# Patient Record
Sex: Male | Born: 1965 | Race: Black or African American | Hispanic: No | Marital: Married | Smoking: Never smoker
Health system: Southern US, Community
[De-identification: ages and names within clinical notes are randomized; demographics above are authoritative.]

## PROBLEM LIST (undated history)

## (undated) DIAGNOSIS — E785 Hyperlipidemia, unspecified: Secondary | ICD-10-CM

## (undated) DIAGNOSIS — M545 Low back pain, unspecified: Secondary | ICD-10-CM

## (undated) DIAGNOSIS — E119 Type 2 diabetes mellitus without complications: Secondary | ICD-10-CM

## (undated) DIAGNOSIS — Z87442 Personal history of urinary calculi: Secondary | ICD-10-CM

## (undated) DIAGNOSIS — F419 Anxiety disorder, unspecified: Secondary | ICD-10-CM

## (undated) DIAGNOSIS — M109 Gout, unspecified: Secondary | ICD-10-CM

## (undated) DIAGNOSIS — T4145XA Adverse effect of unspecified anesthetic, initial encounter: Secondary | ICD-10-CM

## (undated) DIAGNOSIS — G4730 Sleep apnea, unspecified: Secondary | ICD-10-CM

## (undated) DIAGNOSIS — E079 Disorder of thyroid, unspecified: Secondary | ICD-10-CM

## (undated) DIAGNOSIS — I2510 Atherosclerotic heart disease of native coronary artery without angina pectoris: Secondary | ICD-10-CM

## (undated) DIAGNOSIS — G629 Polyneuropathy, unspecified: Secondary | ICD-10-CM

## (undated) DIAGNOSIS — I509 Heart failure, unspecified: Secondary | ICD-10-CM

## (undated) DIAGNOSIS — I10 Essential (primary) hypertension: Secondary | ICD-10-CM

## (undated) DIAGNOSIS — N184 Chronic kidney disease, stage 4 (severe): Secondary | ICD-10-CM

## (undated) DIAGNOSIS — E039 Hypothyroidism, unspecified: Secondary | ICD-10-CM

## (undated) DIAGNOSIS — K219 Gastro-esophageal reflux disease without esophagitis: Secondary | ICD-10-CM

## (undated) DIAGNOSIS — T8859XA Other complications of anesthesia, initial encounter: Secondary | ICD-10-CM

## (undated) DIAGNOSIS — I219 Acute myocardial infarction, unspecified: Secondary | ICD-10-CM

## (undated) HISTORY — DX: Low back pain, unspecified: M54.50

## (undated) HISTORY — DX: Sleep apnea, unspecified: G47.30

## (undated) HISTORY — DX: Essential (primary) hypertension: I10

## (undated) HISTORY — DX: Disorder of thyroid, unspecified: E07.9

## (undated) HISTORY — DX: Low back pain: M54.5

## (undated) HISTORY — DX: Personal history of urinary calculi: Z87.442

## (undated) HISTORY — DX: Heart failure, unspecified: I50.9

## (undated) HISTORY — DX: Atherosclerotic heart disease of native coronary artery without angina pectoris: I25.10

## (undated) HISTORY — DX: Type 2 diabetes mellitus without complications: E11.9

## (undated) HISTORY — DX: Acute myocardial infarction, unspecified: I21.9

## (undated) HISTORY — PX: TRACHEOSTOMY: SUR1362

## (undated) HISTORY — DX: Gastro-esophageal reflux disease without esophagitis: K21.9

## (undated) HISTORY — PX: OTHER SURGICAL HISTORY: SHX169

## (undated) HISTORY — PX: CORONARY ANGIOPLASTY WITH STENT PLACEMENT: SHX49

## (undated) HISTORY — DX: Hyperlipidemia, unspecified: E78.5

## (undated) HISTORY — PX: REFRACTIVE SURGERY: SHX103

---

## 1998-06-11 ENCOUNTER — Emergency Department (HOSPITAL_COMMUNITY): Admission: EM | Admit: 1998-06-11 | Discharge: 1998-06-11 | Payer: Self-pay | Admitting: Emergency Medicine

## 1998-06-13 ENCOUNTER — Emergency Department (HOSPITAL_COMMUNITY): Admission: EM | Admit: 1998-06-13 | Discharge: 1998-06-13 | Payer: Self-pay | Admitting: Emergency Medicine

## 2001-02-07 ENCOUNTER — Emergency Department (HOSPITAL_COMMUNITY): Admission: EM | Admit: 2001-02-07 | Discharge: 2001-02-07 | Payer: Self-pay | Admitting: Emergency Medicine

## 2001-02-11 ENCOUNTER — Emergency Department (HOSPITAL_COMMUNITY): Admission: EM | Admit: 2001-02-11 | Discharge: 2001-02-11 | Payer: Self-pay | Admitting: *Deleted

## 2001-02-17 ENCOUNTER — Encounter: Admission: RE | Admit: 2001-02-17 | Discharge: 2001-02-17 | Payer: Self-pay | Admitting: Surgery

## 2001-02-17 ENCOUNTER — Encounter: Payer: Self-pay | Admitting: Surgery

## 2001-02-18 ENCOUNTER — Encounter: Admission: RE | Admit: 2001-02-18 | Discharge: 2001-02-18 | Payer: Self-pay | Admitting: Surgery

## 2001-02-18 ENCOUNTER — Encounter: Payer: Self-pay | Admitting: Surgery

## 2001-02-20 ENCOUNTER — Inpatient Hospital Stay (HOSPITAL_COMMUNITY): Admission: RE | Admit: 2001-02-20 | Discharge: 2001-03-02 | Payer: Self-pay | Admitting: Surgery

## 2001-02-20 ENCOUNTER — Encounter: Payer: Self-pay | Admitting: Surgery

## 2001-02-20 ENCOUNTER — Encounter (INDEPENDENT_AMBULATORY_CARE_PROVIDER_SITE_OTHER): Payer: Self-pay

## 2001-02-22 ENCOUNTER — Encounter: Payer: Self-pay | Admitting: Surgery

## 2001-02-24 ENCOUNTER — Encounter: Payer: Self-pay | Admitting: Surgery

## 2001-03-22 ENCOUNTER — Encounter: Admission: RE | Admit: 2001-03-22 | Discharge: 2001-03-22 | Payer: Self-pay | Admitting: Surgery

## 2001-03-22 ENCOUNTER — Encounter: Payer: Self-pay | Admitting: Surgery

## 2001-04-15 ENCOUNTER — Ambulatory Visit: Admission: RE | Admit: 2001-04-15 | Discharge: 2001-04-15 | Payer: Self-pay | Admitting: Surgery

## 2001-04-29 ENCOUNTER — Encounter: Admission: RE | Admit: 2001-04-29 | Discharge: 2001-07-28 | Payer: Self-pay | Admitting: Surgery

## 2001-05-03 ENCOUNTER — Encounter: Admission: RE | Admit: 2001-05-03 | Discharge: 2001-05-03 | Payer: Self-pay | Admitting: Surgery

## 2001-05-03 ENCOUNTER — Encounter: Payer: Self-pay | Admitting: Surgery

## 2001-10-22 ENCOUNTER — Emergency Department (HOSPITAL_COMMUNITY): Admission: EM | Admit: 2001-10-22 | Discharge: 2001-10-22 | Payer: Self-pay | Admitting: *Deleted

## 2001-12-21 ENCOUNTER — Encounter: Payer: Self-pay | Admitting: Endocrinology

## 2001-12-21 ENCOUNTER — Ambulatory Visit (HOSPITAL_COMMUNITY): Admission: RE | Admit: 2001-12-21 | Discharge: 2001-12-21 | Payer: Self-pay | Admitting: Endocrinology

## 2002-01-17 ENCOUNTER — Encounter: Admission: RE | Admit: 2002-01-17 | Discharge: 2002-02-25 | Payer: Self-pay | Admitting: Occupational Medicine

## 2002-07-19 ENCOUNTER — Encounter: Payer: Self-pay | Admitting: Emergency Medicine

## 2002-07-20 ENCOUNTER — Encounter: Payer: Self-pay | Admitting: Emergency Medicine

## 2002-07-20 ENCOUNTER — Inpatient Hospital Stay (HOSPITAL_COMMUNITY): Admission: EM | Admit: 2002-07-20 | Discharge: 2002-07-24 | Payer: Self-pay | Admitting: Emergency Medicine

## 2002-07-20 ENCOUNTER — Encounter: Payer: Self-pay | Admitting: Internal Medicine

## 2002-07-21 ENCOUNTER — Encounter: Payer: Self-pay | Admitting: Internal Medicine

## 2002-07-22 ENCOUNTER — Encounter: Payer: Self-pay | Admitting: Internal Medicine

## 2003-04-19 ENCOUNTER — Encounter: Payer: Self-pay | Admitting: Emergency Medicine

## 2003-04-19 ENCOUNTER — Encounter: Payer: Self-pay | Admitting: Endocrinology

## 2003-04-20 ENCOUNTER — Inpatient Hospital Stay (HOSPITAL_COMMUNITY): Admission: EM | Admit: 2003-04-20 | Discharge: 2003-04-21 | Payer: Self-pay | Admitting: Emergency Medicine

## 2003-04-20 ENCOUNTER — Encounter: Payer: Self-pay | Admitting: Cardiology

## 2003-04-21 ENCOUNTER — Encounter: Payer: Self-pay | Admitting: Internal Medicine

## 2004-12-25 ENCOUNTER — Other Ambulatory Visit: Payer: Self-pay

## 2004-12-25 ENCOUNTER — Emergency Department: Payer: Self-pay | Admitting: Emergency Medicine

## 2006-05-28 ENCOUNTER — Ambulatory Visit: Payer: Self-pay | Admitting: Infectious Diseases

## 2006-09-04 ENCOUNTER — Emergency Department: Payer: Self-pay | Admitting: Emergency Medicine

## 2006-09-04 ENCOUNTER — Emergency Department: Payer: Self-pay | Admitting: Internal Medicine

## 2006-09-04 ENCOUNTER — Other Ambulatory Visit: Payer: Self-pay

## 2006-10-27 ENCOUNTER — Ambulatory Visit: Payer: Self-pay | Admitting: Infectious Diseases

## 2006-11-23 ENCOUNTER — Other Ambulatory Visit: Payer: Self-pay

## 2006-11-23 ENCOUNTER — Emergency Department: Payer: Self-pay | Admitting: Emergency Medicine

## 2007-03-30 ENCOUNTER — Ambulatory Visit: Payer: Self-pay | Admitting: Specialist

## 2007-03-31 ENCOUNTER — Inpatient Hospital Stay: Payer: Self-pay | Admitting: Specialist

## 2007-04-05 ENCOUNTER — Ambulatory Visit: Payer: Self-pay | Admitting: Specialist

## 2007-04-16 ENCOUNTER — Inpatient Hospital Stay (HOSPITAL_COMMUNITY): Admission: EM | Admit: 2007-04-16 | Discharge: 2007-04-22 | Payer: Self-pay | Admitting: Emergency Medicine

## 2007-04-16 ENCOUNTER — Ambulatory Visit: Payer: Self-pay | Admitting: Vascular Surgery

## 2007-04-20 ENCOUNTER — Ambulatory Visit: Payer: Self-pay | Admitting: Internal Medicine

## 2007-05-04 ENCOUNTER — Ambulatory Visit: Payer: Self-pay | Admitting: Endocrinology

## 2007-05-04 LAB — CONVERTED CEMR LAB: Calcium, Total (PTH): 9.2 mg/dL (ref 8.4–10.5)

## 2007-05-12 ENCOUNTER — Ambulatory Visit: Payer: Self-pay | Admitting: Endocrinology

## 2007-05-18 ENCOUNTER — Encounter (HOSPITAL_BASED_OUTPATIENT_CLINIC_OR_DEPARTMENT_OTHER): Admission: RE | Admit: 2007-05-18 | Discharge: 2007-07-22 | Payer: Self-pay | Admitting: Surgery

## 2007-08-13 ENCOUNTER — Emergency Department (HOSPITAL_COMMUNITY): Admission: EM | Admit: 2007-08-13 | Discharge: 2007-08-13 | Payer: Self-pay | Admitting: Emergency Medicine

## 2007-09-27 ENCOUNTER — Ambulatory Visit: Payer: Self-pay | Admitting: Internal Medicine

## 2007-10-17 ENCOUNTER — Ambulatory Visit: Payer: Self-pay | Admitting: Internal Medicine

## 2007-10-22 ENCOUNTER — Encounter: Payer: Self-pay | Admitting: Internal Medicine

## 2007-11-05 ENCOUNTER — Encounter: Payer: Self-pay | Admitting: Endocrinology

## 2007-11-15 ENCOUNTER — Encounter: Payer: Self-pay | Admitting: Endocrinology

## 2007-11-15 DIAGNOSIS — M545 Low back pain: Secondary | ICD-10-CM

## 2007-11-15 DIAGNOSIS — I10 Essential (primary) hypertension: Secondary | ICD-10-CM | POA: Insufficient documentation

## 2007-11-15 DIAGNOSIS — E785 Hyperlipidemia, unspecified: Secondary | ICD-10-CM

## 2007-11-15 DIAGNOSIS — I2510 Atherosclerotic heart disease of native coronary artery without angina pectoris: Secondary | ICD-10-CM | POA: Insufficient documentation

## 2007-11-15 DIAGNOSIS — K219 Gastro-esophageal reflux disease without esophagitis: Secondary | ICD-10-CM | POA: Insufficient documentation

## 2007-11-15 DIAGNOSIS — A4901 Methicillin susceptible Staphylococcus aureus infection, unspecified site: Secondary | ICD-10-CM | POA: Insufficient documentation

## 2007-11-15 DIAGNOSIS — Z87442 Personal history of urinary calculi: Secondary | ICD-10-CM | POA: Insufficient documentation

## 2007-11-15 DIAGNOSIS — E119 Type 2 diabetes mellitus without complications: Secondary | ICD-10-CM

## 2007-11-15 DIAGNOSIS — Z8669 Personal history of other diseases of the nervous system and sense organs: Secondary | ICD-10-CM | POA: Insufficient documentation

## 2007-11-15 DIAGNOSIS — I509 Heart failure, unspecified: Secondary | ICD-10-CM | POA: Insufficient documentation

## 2007-11-15 DIAGNOSIS — J309 Allergic rhinitis, unspecified: Secondary | ICD-10-CM | POA: Insufficient documentation

## 2007-11-16 ENCOUNTER — Ambulatory Visit: Payer: Self-pay | Admitting: Endocrinology

## 2007-11-20 ENCOUNTER — Emergency Department (HOSPITAL_COMMUNITY): Admission: EM | Admit: 2007-11-20 | Discharge: 2007-11-20 | Payer: Self-pay | Admitting: Emergency Medicine

## 2007-12-31 ENCOUNTER — Encounter: Payer: Self-pay | Admitting: Endocrinology

## 2008-01-07 ENCOUNTER — Encounter: Payer: Self-pay | Admitting: Endocrinology

## 2008-02-03 ENCOUNTER — Encounter: Payer: Self-pay | Admitting: Endocrinology

## 2008-02-16 ENCOUNTER — Encounter: Payer: Self-pay | Admitting: Endocrinology

## 2008-02-24 ENCOUNTER — Emergency Department (HOSPITAL_COMMUNITY): Admission: EM | Admit: 2008-02-24 | Discharge: 2008-02-24 | Payer: Self-pay | Admitting: Emergency Medicine

## 2008-05-10 ENCOUNTER — Emergency Department (HOSPITAL_COMMUNITY): Admission: EM | Admit: 2008-05-10 | Discharge: 2008-05-10 | Payer: Self-pay | Admitting: Emergency Medicine

## 2008-09-19 ENCOUNTER — Observation Stay (HOSPITAL_COMMUNITY): Admission: EM | Admit: 2008-09-19 | Discharge: 2008-09-20 | Payer: Self-pay | Admitting: Emergency Medicine

## 2008-09-19 ENCOUNTER — Ambulatory Visit: Payer: Self-pay | Admitting: Cardiovascular Disease

## 2009-06-01 ENCOUNTER — Other Ambulatory Visit: Payer: Self-pay | Admitting: Otolaryngology

## 2009-07-20 ENCOUNTER — Ambulatory Visit: Payer: Self-pay | Admitting: Otolaryngology

## 2009-07-26 ENCOUNTER — Ambulatory Visit: Payer: Self-pay | Admitting: Otolaryngology

## 2009-10-23 ENCOUNTER — Ambulatory Visit: Payer: Self-pay | Admitting: Internal Medicine

## 2009-12-05 ENCOUNTER — Ambulatory Visit: Payer: Self-pay | Admitting: Internal Medicine

## 2009-12-18 ENCOUNTER — Ambulatory Visit: Payer: Self-pay | Admitting: Internal Medicine

## 2010-01-17 ENCOUNTER — Ambulatory Visit: Payer: Self-pay

## 2010-03-24 ENCOUNTER — Emergency Department: Payer: Self-pay

## 2010-06-11 ENCOUNTER — Ambulatory Visit: Payer: Self-pay | Admitting: Pain Medicine

## 2010-06-17 ENCOUNTER — Ambulatory Visit: Payer: Self-pay | Admitting: Pain Medicine

## 2010-07-09 ENCOUNTER — Encounter: Payer: Self-pay | Admitting: Pain Medicine

## 2010-07-18 ENCOUNTER — Encounter: Payer: Self-pay | Admitting: Pain Medicine

## 2010-08-17 ENCOUNTER — Encounter: Payer: Self-pay | Admitting: Pain Medicine

## 2010-09-19 ENCOUNTER — Ambulatory Visit: Payer: Self-pay | Admitting: Internal Medicine

## 2010-09-27 ENCOUNTER — Ambulatory Visit: Payer: Self-pay | Admitting: Internal Medicine

## 2010-10-04 ENCOUNTER — Ambulatory Visit: Payer: Self-pay | Admitting: Internal Medicine

## 2010-11-17 HISTORY — PX: AMPUTATION: SHX166

## 2010-11-29 ENCOUNTER — Inpatient Hospital Stay (HOSPITAL_COMMUNITY)
Admission: RE | Admit: 2010-11-29 | Discharge: 2010-12-10 | Payer: Self-pay | Attending: Physical Medicine & Rehabilitation | Admitting: Physical Medicine & Rehabilitation

## 2010-12-02 LAB — GLUCOSE, CAPILLARY
Glucose-Capillary: 69 mg/dL — ABNORMAL LOW (ref 70–99)
Glucose-Capillary: 99 mg/dL (ref 70–99)
Glucose-Capillary: 100 mg/dL — ABNORMAL HIGH (ref 70–99)
Glucose-Capillary: 104 mg/dL — ABNORMAL HIGH (ref 70–99)
Glucose-Capillary: 137 mg/dL — ABNORMAL HIGH (ref 70–99)
Glucose-Capillary: 115 mg/dL — ABNORMAL HIGH (ref 70–99)
Glucose-Capillary: 119 mg/dL — ABNORMAL HIGH (ref 70–99)
Glucose-Capillary: 143 mg/dL — ABNORMAL HIGH (ref 70–99)
Glucose-Capillary: 128 mg/dL — ABNORMAL HIGH (ref 70–99)
Glucose-Capillary: 170 mg/dL — ABNORMAL HIGH (ref 70–99)
Glucose-Capillary: 68 mg/dL — ABNORMAL LOW (ref 70–99)
Glucose-Capillary: 106 mg/dL — ABNORMAL HIGH (ref 70–99)
Glucose-Capillary: 144 mg/dL — ABNORMAL HIGH (ref 70–99)

## 2010-12-02 LAB — DIFFERENTIAL
Basophils Absolute: 0 10*3/uL (ref 0.0–0.1)
Basophils Relative: 0 % (ref 0–1)
Eosinophils Absolute: 0.1 10*3/uL (ref 0.0–0.7)
Eosinophils Relative: 1 % (ref 0–5)
Lymphocytes Relative: 21 % (ref 12–46)
Lymphs Abs: 1.6 10*3/uL (ref 0.7–4.0)
Monocytes Absolute: 0.7 10*3/uL (ref 0.1–1.0)
Monocytes Relative: 9 % (ref 3–12)
Neutro Abs: 5.1 10*3/uL (ref 1.7–7.7)
Neutrophils Relative %: 68 % (ref 43–77)

## 2010-12-02 LAB — CBC
HCT: 26.7 % — ABNORMAL LOW (ref 39.0–52.0)
Hemoglobin: 8.7 g/dL — ABNORMAL LOW (ref 13.0–17.0)
MCH: 28.6 pg (ref 26.0–34.0)
MCHC: 32.6 g/dL (ref 30.0–36.0)
MCV: 87.8 fL (ref 78.0–100.0)
Platelets: 267 10*3/uL (ref 150–400)
RBC: 3.04 MIL/uL — ABNORMAL LOW (ref 4.22–5.81)
RDW: 17 % — ABNORMAL HIGH (ref 11.5–15.5)
WBC: 7.5 10*3/uL (ref 4.0–10.5)

## 2010-12-02 LAB — COMPREHENSIVE METABOLIC PANEL
ALT: 51 U/L (ref 0–53)
AST: 30 U/L (ref 0–37)
Albumin: 2.5 g/dL — ABNORMAL LOW (ref 3.5–5.2)
Alkaline Phosphatase: 95 U/L (ref 39–117)
BUN: 63 mg/dL — ABNORMAL HIGH (ref 6–23)
CO2: 22 mEq/L (ref 19–32)
Calcium: 8.4 mg/dL (ref 8.4–10.5)
Chloride: 103 mEq/L (ref 96–112)
Creatinine, Ser: 2.09 mg/dL — ABNORMAL HIGH (ref 0.4–1.5)
GFR calc Af Amer: 42 mL/min — ABNORMAL LOW (ref >60)
GFR calc non Af Amer: 35 mL/min — ABNORMAL LOW (ref >60)
Glucose, Bld: 101 mg/dL — ABNORMAL HIGH (ref 70–99)
Potassium: 4.8 mEq/L (ref 3.5–5.1)
Sodium: 133 mEq/L — ABNORMAL LOW (ref 135–145)
Total Bilirubin: 1.1 mg/dL (ref 0.3–1.2)
Total Protein: 7.1 g/dL (ref 6.0–8.3)

## 2010-12-02 LAB — MRSA PCR SCREENING: MRSA by PCR: POSITIVE — AB

## 2010-12-04 LAB — URINALYSIS, ROUTINE W REFLEX MICROSCOPIC
Bilirubin Urine: NEGATIVE
Ketones, ur: NEGATIVE mg/dL
Nitrite: NEGATIVE
Protein, ur: NEGATIVE mg/dL
Specific Gravity, Urine: 1.011 (ref 1.005–1.030)
Urine Glucose, Fasting: NEGATIVE mg/dL
Urobilinogen, UA: 1 mg/dL (ref 0.0–1.0)
pH: 6 (ref 5.0–8.0)

## 2010-12-04 LAB — BASIC METABOLIC PANEL
BUN: 76 mg/dL — ABNORMAL HIGH (ref 6–23)
BUN: 89 mg/dL — ABNORMAL HIGH (ref 6–23)
CO2: 21 mEq/L (ref 19–32)
CO2: 22 mEq/L (ref 19–32)
Calcium: 8.4 mg/dL (ref 8.4–10.5)
Calcium: 8.1 mg/dL — ABNORMAL LOW (ref 8.4–10.5)
Chloride: 99 mEq/L (ref 96–112)
Chloride: 100 mEq/L (ref 96–112)
Creatinine, Ser: 2.49 mg/dL — ABNORMAL HIGH (ref 0.4–1.5)
Creatinine, Ser: 3.17 mg/dL — ABNORMAL HIGH (ref 0.4–1.5)
GFR calc Af Amer: 34 mL/min — ABNORMAL LOW (ref >60)
GFR calc Af Amer: 26 mL/min — ABNORMAL LOW (ref >60)
GFR calc non Af Amer: 28 mL/min — ABNORMAL LOW (ref >60)
GFR calc non Af Amer: 21 mL/min — ABNORMAL LOW (ref >60)
Glucose, Bld: 100 mg/dL — ABNORMAL HIGH (ref 70–99)
Glucose, Bld: 137 mg/dL — ABNORMAL HIGH (ref 70–99)
Potassium: 5.2 mEq/L — ABNORMAL HIGH (ref 3.5–5.1)
Potassium: 5.8 mEq/L — ABNORMAL HIGH (ref 3.5–5.1)
Sodium: 131 mEq/L — ABNORMAL LOW (ref 135–145)
Sodium: 130 mEq/L — ABNORMAL LOW (ref 135–145)

## 2010-12-04 LAB — URINE MICROSCOPIC-ADD ON

## 2010-12-04 LAB — GLUCOSE, CAPILLARY
Glucose-Capillary: 102 mg/dL — ABNORMAL HIGH (ref 70–99)
Glucose-Capillary: 175 mg/dL — ABNORMAL HIGH (ref 70–99)
Glucose-Capillary: 188 mg/dL — ABNORMAL HIGH (ref 70–99)
Glucose-Capillary: 177 mg/dL — ABNORMAL HIGH (ref 70–99)
Glucose-Capillary: 138 mg/dL — ABNORMAL HIGH (ref 70–99)
Glucose-Capillary: 133 mg/dL — ABNORMAL HIGH (ref 70–99)
Glucose-Capillary: 170 mg/dL — ABNORMAL HIGH (ref 70–99)
Glucose-Capillary: 121 mg/dL — ABNORMAL HIGH (ref 70–99)
Glucose-Capillary: 163 mg/dL — ABNORMAL HIGH (ref 70–99)

## 2010-12-06 NOTE — Consult Note (Signed)
[name], [name]             ACCOUNT NO.:  [id number]  MEDICAL RECORD NO.:  [id number]          PATIENT TYPE:  IPS  LOCATION:  4007                         FACILITY:  [facility]  PHYSICIAN:  Wilber Bihari. Caryn Section, M.D.   DATE OF BIRTH:  1966-02-07  DATE OF CONSULTATION:  12/04/2010 DATE OF DISCHARGE:                                CONSULTATION   HISTORY OF PRESENT ILLNESS:  Mr. Pamer is a 45 year old black man recently discharged from [facility] following left BKA.  He has a long history of diabetes mellitus and had Charcot joint left foot.  He was hospitalized at [facility], November 13, 2010 - November 29, 2010.  According to cardiologist (Dr. Youlanda Mighty, phone [phone]) he had two episodes acute renal failure while at [facility]. The first (BUN/CR 105/3.3) resolved with decrease of beta-blocker (Coreg was decreased to 3.125 mg b.i.d.).  Dr. Lupita Shutter says that because of severe CHF (both right and left), heart rate should be in 70s-80s "perfuse kidneys."  The second episode resolved with placement of a Foley catheter (which was discontinued at the time of discharge).  He was admitted for rehab with BUN/CR 63/2.09 on November 30, 2010 (BUN 40, CR 1.5 at the time of discharge from [facility]).  Renal function has worsened (BUN 106, CR 4.17, potassium 5.9 today).  Renal consult requested by Dr. Riley Kill.  PAST MEDICAL HISTORY: 1. DM-2 >20 years. 2. High BP. 3. CHF (biventricular). 4. CAD (MI status post stent in 2008 at [facility]).  PAST SURGICAL HISTORY:  Right ear (tube) surgery.  Nose ("replaced cartilage").  AbScess right groin drained 2002.  CURRENT MEDICATIONS: 1. Mobic (NSAID) 7.5 b.i.d. p.r.n. (has not received any according to     [facility]). 2. Coreg 12.5 b.i.d. 3. Aspirin 81 daily. 4. Cipro 250 b.i.d. 5. Plavix 75 daily. 6. Lovenox. 7. Flonase. 8. Neurontin 300 b.i.d. 9. Insulin. 10.Ketoconazole cream. 11.Eucerin cream. 12.Magnesium oxide 400 daily. 13.Reglan 5 t.i.d. 14.Lovaza 1 gram  t.i.d. 15.Protonix 40 daily plus p.r.n.'s (spironolactone discontinued     yesterday).  ALLERGIES: 1. SEPTRA. 2. STATINS. 3. ACE INHIBITORS. 4. CALCIUM BLOCKERS (according to note from Pharmacy).  SOCIAL HISTORY:  He grew up in [place].  He finished twelfth grade.  He attended school for [employer] for 2 years and currently works as a Investment banker, operational (at [employer]  He and his wife have been married for 19 years.  He lives with his wife.  He does not smoke cigarettes or drink alcohol.  FAMILY HISTORY:  Father?  Mother 53, has arthritis.  Two children, healthy.  No family history of renal disease.  REVIEW OF SYSTEMS:  Edema.  No decreased force of urinary stream, no hesitancy.  No angina, no claudication.  No melena, no hematochezia, no gross hematuria, no renal colic.  No DOE, no orthopnea.  No purulent sputum, no hemoptysis.  No cold or heat intolerance.  PHYSICAL EXAMINATION:  GENERAL:  He is awake, alert, and friendly. VITAL SIGNS:  Temperature 97.4, pulse 62, respirations 18, and blood pressure 133/86. CHEST:  Clear. HEART:  No rub. ABDOMEN:  Protuberant, no organs or masses are felt.  Bowel sounds  are present. GU:  Circumcised penis.  Testes descended bilaterally. EXTREMITIES:  Left BKA, 2+ pretibial edema on the right. NEURO:  Right handed.  Strength equal.  Decreased sensation, right foot.  LABORATORY DATA:  Hemoglobin 8.7, WBC 7500, PLT is 267K (from November 30, 2010).  Labs today, sodium 128, potassium 5.9, chloride 98, CO2 18, BUN 106, CR 4.17, calcium 7.9, phosphorus 5.3, and albumin 2.4.  Renal ultrasound pending.  Bladder scan pending.  Urinalysis:  SG 1.011, pH 6, negative protein, negative nitrite, positive LE, 20-50 red cells, and 20- 50 white cells.  Urine culture greater than 100,000 Klebsiella (sensitive to Cipro).  IMPRESSION: 1. Diabetes mellitus. 2. High blood pressure. 3. Coronary artery disease status post myocardial  infarction, status     post percutaneous coronary intervention. 4. Status post recent left below-knee amputation. 5. Acute renal failure. 6. Anemia. 7. Congestive heart failure.  PLAN: 1. Per primary service insulin plus sliding scale.  He says he sees     eye doctor regularly. 2. Decrease Coreg to 3.125 b.i.d. per Dr. Francie Massing recommendations.     His heart rate should be 75-80. 3. No suggestions. 4. Per Rehab. 5. Renal ultrasound, Foley cath, no NSAIDs, no need for SPEP and UPEP     at the present time. 6. Check iron studies, check Hemoccult. 7. Restart Demadex, discontinue IV fluids.  Bladder scan show 240 cc PVR     Richard F. Caryn Section, M.D.     RFF/MEDQ  D:  12/04/2010  T:  12/05/2010  Job:  [id number]  cc:   Youlanda Mighty, M.D. [name], M.D.  Electronically Signed by Marina Gravel M.D. on 12/06/2010 04:00:00 PM

## 2010-12-06 NOTE — H&P (Signed)
[name], [name]             ACCOUNT NO.:  [id number]  MEDICAL RECORD NO.:  [id number]          PATIENT TYPE:  IPS  LOCATION:  4007                         FACILITY:  [facility]  PHYSICIAN:  Ranelle Oyster, M.D.DATE OF BIRTH:  1966/09/21  DATE OF ADMISSION:  11/29/2010 DATE OF DISCHARGE:                             HISTORY & PHYSICAL   CHIEF COMPLAINT:  Left leg pain.  ORTHOPEDIC SURGEON:  [name], MD  FAMILY PHYSICIAN:  [name], MD  HISTORY OF PRESENT ILLNESS:  This is a pleasant 45 year old white male who works at [employer] who has a history of coronary artery disease and ischemic cardiomyopathy who had persistent problems with a left foot wound and increasing fever.  He has history of bilateral Charcot feet.  Ultimately on November 15, 2010, the patient went to the OR for irrigation and debridement of the left foot.  Started on IV antibiotics afterward.  Ultimately, the foot could not be safe and on November 21, 2010, he underwent a left below-knee amputation.  The patient has had IV antibiotics, which were discontinued ultimately on November 23, 2010.  The patient has been afebrile since.  He was placed in a mobilizing device to promote knee extension as well as to protect the limb.  He was placed in a stump shrinker 2 days ago as well for edema control.  He has been under IV and oral pain medication for analgesia and has seen improvement there.  He has had some phantom pain recently in Sunday and Monday, but this is improving as well.  On admission initially, the patient was found to have C. diff positive and sternal oral vancomycin and was treated for a 10-day course.  This was stopped on November 23, 2010.  The patient had no further episodes of diarrhea since completion.  On admission as well, the patient had a creatinine 2.4.  It initially increased to 5.2, but with diuresis of Foley catheter, creatinine has been trending back down to  baseline. Foley catheter was placed for urine retention and has been removed postoperatively with the patient has been emptying his bladder fairly well over the last few days.  The patient was followed by Cardiology for his fluid overload and Isordil was started to decrease afterload, improved renal perfusion and cardiac function and this has helped both kidneys and heart.  Diabetes mellitus has been poorly controlled leading up to this admission and hemoglobin A1c is 9.2.  He was placed on Lantus and lispro insulin combination with improvement of his sugars into the 120-180 range.  PAST MEDICAL HISTORY:  Notable for the above as well as, 1. Type 2 diabetes. 2. ST elevation MI, heart catheterization in 2008. 3. Obesity. 4. Obstructive sleep apnea. 5. Chronic constipation. 6. History of MRSA infection in both legs. 7. Likely pulmonary hypertension. 8. Moderate restrictive lung disease. 9. Recurrent sciatica. 10.Gouty arthritis and stress fracture in 2009.  FAMILY HISTORY:  Noncontributory.  SOCIAL HISTORY:  The patient's does not denies alcohol or tobacco use. 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  ALLERGIES: 1. ACE INHIBITORS. 2. LOVASTATIN. 3. PRAVACHOL.  4. SEPTRA.  MEDICATIONS AND LABS:  Please see written H and P.  PHYSICAL EXAMINATION:  GENERAL:  The patient is pleasant, alert and oriented x3.  Sitting comfortably in bed. HEENT:  Pupils are equally round and reactive to light.  Ear, nose and throat exams are notable for good dentition.  Pink moist mucosa. NECK:  Supple without JVD or lymphadenopathy. HEART:  Regular rate and rhythm without murmur, rub, or gallops, heart sounds were distant. ABDOMEN:  Soft.  Abdomen was slightly distended, nontender.  Bowel sounds positive. EXTREMITIES:  Notable for the following:  Left below-knee stump was clean and intact with sutures.  Area was well approximated with no drainage.  He is wearing a  protective extension device over the left leg, seemed to be fitting appropriately with no signs of irritation or breakdown. SKIN:  Right lower extremity notable for Charcot deformity of the foot with significant callus formation over the medial arch and some early breakdown.  Skin was rather dry. NEUROLOGIC:  The patient had decreased sensation over the right foot to light touch and this persisted up to about the ankle region.  No obvious sensory loss is seen in the hands.  Remainder of his neurological exam was notable for normal cranial nerve exam.  Reflexes are 1+ and motor exam is notable for 5/5 strength in the upper extremities.  Right lower extremity was 4-5/5 except for some limitations at the right ankle, which were more due to his orthopedic dysfunction there.  Cognitively, he is alert and appropriate, good insight and awareness.  Memory was excellent.  POST ADMISSION PHYSICIAN EVALUATION: 1. Functional deficit secondary to left below-knee amputation related     to Charcot foot deformity with infection and failure to heal. 2. The patient was admitted to receive collaborative interdisciplinary     care between the physiatrist, rehab nursing staff, and therapy     team. 3. The patient's level of medical complexity and substantial therapy     needs in context of that medical necessity cannot be provided at a     lesser intensity of care. 4. The patient has experienced essential functional loss from his     baseline.  Premorbidly, he was independent in working until foot     became more problematic.  Currently, he is in the min assist range     for basic mobility and transfer, min-to-mod assist for ADLs.     Judging by the patient's diagnosis, physical exam and functional     history, he has the potential for functional progress, which result     in measurable gains while in inpatient rehab.  These gains will be     of substantial and practical use upon discharge to home in      facilitating mobility and self-care. 5. The physiatrist will provide 24-hour management of medical needs as     well as oversight of the therapy plan/treatment and provide     guidance as appropriate regarding interaction of the two.  Medical     problem list and plan are below. 6. A 24-hour rehab nursing team will assist in the management of the     patient's bowel and bladder function, pain, skin care, management     of left below-knee wound and integration of therapy concepts,     techniques. 7. PT will assess and treat for lower extremity strength, range of     motion, preprosthetic education, wheelchair mobility and short     distance, gait is  appropriate.  Goals modified independent. 8. OT will assess and treat for upper extremity use ADLs, adaptive     techniques, equipment, functional mobility, safety awareness,     preprosthetic and compensatory strategies with goals modified     independent. 9. Case management and social worker will assess and treat for     psychosocial issues and discharge planning. 10.Team conference will be held weekly to assess progress towards     goals and to determine barriers at discharge. 11.The patient has demonstrated sufficient medical stability and     exercise capacity to tolerate at least 3 hours of therapy per day     at least 5 days per week. 12.Estimated length of stay is 7+ days.  Prognosis good.  MEDICAL PROBLEM LIST AND PLAN: 1. Deep venous thrombosis prophylaxis:  Add subcu Lovenox until     adequately mobile.  Watch for any bleeding complications or signs. 2. Pain management with p.r.n. oxycodone.  It seems to be fairly     effective.  We will use p.r.n. only for now and consider scheduling     if needed.  Phantom pain has improved, so we will not treat with     any anticonvulsants. 3. Diabetes type 2:  The patient is on Lantus and NovoLog.  We will     monitor blood sugars a.c. and at bedtime.  His hemoglobin A1c was     9.2 on  admission, so he needs better control on education regarding     diet. 4. Chronic kidney disease:  We will follow up labs on Monday to assess     renal status.  He has been emptying his bladder and making good     urine at the time of this dictation. 5. Congestive heart failure:  This was compensated.  We will check     daily weights and follow I's and O's.  Continue Demadex and     Aldactone. 6. Gout:  Continue allopurinol and colchicine currently. 7. Coronary artery disease:  Plavix, aspirin, fish oil, Coreg per     Cardiology.  Follow up for any signs and symptoms with activity. 8. Morbid obesity:  The patient has sleep apnea and on CPAP at     bedtime.  We will follow clinically and adjust as needed.     Ranelle Oyster, M.D.     ZTS/MEDQ  D:  11/29/2010  T:  11/30/2010  Job:  [id number]  cc:   [name], MD [name], M.D.  Electronically Signed by Faith Rogue M.D. on 12/06/2010 10:03:34 AM

## 2010-12-09 LAB — COMPREHENSIVE METABOLIC PANEL
ALT: 55 U/L — ABNORMAL HIGH (ref 0–53)
AST: 61 U/L — ABNORMAL HIGH (ref 0–37)
Albumin: 2.6 g/dL — ABNORMAL LOW (ref 3.5–5.2)
Alkaline Phosphatase: 138 U/L — ABNORMAL HIGH (ref 39–117)
Calcium: 7.8 mg/dL — ABNORMAL LOW (ref 8.4–10.5)
GFR calc Af Amer: 29 mL/min — ABNORMAL LOW (ref >60)
Glucose, Bld: 212 mg/dL — ABNORMAL HIGH (ref 70–99)
Potassium: 4.5 mEq/L (ref 3.5–5.1)
Sodium: 131 mEq/L — ABNORMAL LOW (ref 135–145)
Total Protein: 7.5 g/dL (ref 6.0–8.3)

## 2010-12-09 LAB — URINE CULTURE
Colony Count: >=100000
Culture  Setup Time: 201201161538

## 2010-12-09 LAB — RENAL FUNCTION PANEL
BUN: 107 mg/dL — ABNORMAL HIGH (ref 6–23)
CO2: 18 mEq/L — ABNORMAL LOW (ref 19–32)
CO2: 21 mEq/L (ref 19–32)
Calcium: 8 mg/dL — ABNORMAL LOW (ref 8.4–10.5)
Chloride: 98 mEq/L (ref 96–112)
Glucose, Bld: 227 mg/dL — ABNORMAL HIGH (ref 70–99)
Glucose, Bld: 182 mg/dL — ABNORMAL HIGH (ref 70–99)
Phosphorus: 5.6 mg/dL — ABNORMAL HIGH (ref 2.3–4.6)
Potassium: 5.9 mEq/L — ABNORMAL HIGH (ref 3.5–5.1)
Potassium: 5.1 mEq/L (ref 3.5–5.1)
Sodium: 128 mEq/L — ABNORMAL LOW (ref 135–145)

## 2010-12-09 LAB — GLUCOSE, CAPILLARY
Glucose-Capillary: 232 mg/dL — ABNORMAL HIGH (ref 70–99)
Glucose-Capillary: 162 mg/dL — ABNORMAL HIGH (ref 70–99)
Glucose-Capillary: 119 mg/dL — ABNORMAL HIGH (ref 70–99)
Glucose-Capillary: 187 mg/dL — ABNORMAL HIGH (ref 70–99)
Glucose-Capillary: 218 mg/dL — ABNORMAL HIGH (ref 70–99)

## 2010-12-09 LAB — CBC
HCT: 28 % — ABNORMAL LOW (ref 39.0–52.0)
MCHC: 31.1 g/dL (ref 30.0–36.0)
MCV: 87 fL (ref 78.0–100.0)
RDW: 17 % — ABNORMAL HIGH (ref 11.5–15.5)

## 2010-12-09 LAB — HEMOGLOBIN: Hemoglobin: 9.2 g/dL — ABNORMAL LOW (ref 13.0–17.0)

## 2010-12-09 LAB — IRON AND TIBC
Iron: 54 ug/dL (ref 42–135)
Saturation Ratios: 28 % (ref 20–55)
UIBC: 142 ug/dL

## 2010-12-09 LAB — BASIC METABOLIC PANEL
BUN: 103 mg/dL — ABNORMAL HIGH (ref 6–23)
Chloride: 97 mEq/L (ref 96–112)
Glucose, Bld: 149 mg/dL — ABNORMAL HIGH (ref 70–99)
Potassium: 6.5 mEq/L (ref 3.5–5.1)

## 2010-12-10 LAB — GLUCOSE, CAPILLARY
Glucose-Capillary: 203 mg/dL — ABNORMAL HIGH (ref 70–99)
Glucose-Capillary: 248 mg/dL — ABNORMAL HIGH (ref 70–99)
Glucose-Capillary: 305 mg/dL — ABNORMAL HIGH (ref 70–99)
Glucose-Capillary: 338 mg/dL — ABNORMAL HIGH (ref 70–99)
Glucose-Capillary: 322 mg/dL — ABNORMAL HIGH (ref 70–99)
Glucose-Capillary: 258 mg/dL — ABNORMAL HIGH (ref 70–99)

## 2010-12-10 LAB — BASIC METABOLIC PANEL
CO2: 27 mEq/L (ref 19–32)
Calcium: 8 mg/dL — ABNORMAL LOW (ref 8.4–10.5)
Calcium: 8.4 mg/dL (ref 8.4–10.5)
Chloride: 101 mEq/L (ref 96–112)
Creatinine, Ser: 2.39 mg/dL — ABNORMAL HIGH (ref 0.4–1.5)
Creatinine, Ser: 1.48 mg/dL (ref 0.4–1.5)
GFR calc Af Amer: 36 mL/min — ABNORMAL LOW (ref >60)
GFR calc Af Amer: >60 mL/min (ref >60)
GFR calc non Af Amer: 30 mL/min — ABNORMAL LOW (ref >60)
Sodium: 135 mEq/L (ref 135–145)
Sodium: 134 mEq/L — ABNORMAL LOW (ref 135–145)

## 2010-12-11 ENCOUNTER — Emergency Department (HOSPITAL_COMMUNITY)
Admission: EM | Admit: 2010-12-11 | Discharge: 2010-12-11 | Payer: Self-pay | Source: Home / Self Care | Admitting: Emergency Medicine

## 2010-12-11 LAB — GLUCOSE, CAPILLARY: Glucose-Capillary: 209 mg/dL — ABNORMAL HIGH (ref 70–99)

## 2010-12-18 NOTE — Consult Note (Signed)
[name], [name]             ACCOUNT NO.:  [id number]  MEDICAL RECORD NO.:  [id number]          PATIENT TYPE:  IPS  LOCATION:  4007                         FACILITY:  [facility]  PHYSICIAN:  Bertram Millard. Casady Voshell, M.D.DATE OF BIRTH:  1966-02-06  DATE OF CONSULTATION:  12/05/2010 DATE OF DISCHARGE:                                CONSULTATION   REASON FOR CONSULTATION:  Bladder dysfunction.  BRIEF HISTORY:  This 45 year old male has a 20-year history of diabetes. He has multiple sequelae including hypertension, congestive heart failure, coronary artery disease, and Charcot joint in his left foot. He was recently discharged from [facility] following a left BKA.  He recently came to [facility] for rehab.  The patient, while at [facility], had renal insufficiency, acute episodes. One of these resolved with diuresis and Foley catheter placement.  The patient did have urinary retention while there, although I do not have specifics of his bladder function.  At [facility], the patient has had incomplete bladder emptying.  One residual urine volume has been measured at 1300, others have been well over 500.  The patient currently has an indwelling Foley catheter.  The patient denies longstanding history of urinary issues.  He is currently treated for Klebsiella UTI, but denies prior history of frequent urinary tract infections.  He denies a slow stream or feeling of incomplete emptying.  He attributes his bladder dysfunction to having been on antibiotics.  He has never before seen a urologist.  Recent ultrasound of the kidneys revealed no evidence of hydronephrosis or scarring.  There were no mass effects.  There was abdominal ascites noted.  MEDICAL HISTORY:  Significant for: 1. Diabetes mellitus. 2. Hypertension. 3. Congestive heart failure. 4. Coronary artery disease. 5. He had a PTCA in 2008 while at [facility].  SURGERIES:  He has had surgery on his right ear, nasal surgery,  an abscess in his right groin drained, and left BKA recently.  MEDICATIONS:  Coreg, aspirin, Cipro, Plavix, Lovenox, Flonase, Neurontin, insulin, ketoconazole, Eucerin cream, magnesium oxide, Reglan, Lovaza, and Protonix.  ALLERGIES:  He is allergic to SEPTRA, STATINS, ACE INHIBITORS as well as CALCIUM BLOCKERS.  SOCIAL HISTORY:  The patient is married.  He has 2 children.  He lives in [place].  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  He does not use tobacco or alcohol.  FAMILY HISTORY:  Significant for arthritis.  REVIEW OF SYSTEMS:  Negative.  PHYSICAL EXAMINATION:  GENERAL:  Revealed a pleasant adult male.  He was obese, answered questions appropriately. ABDOMEN:  Protuberant, soft, nondistended, nontender.  No mass, no megaly.  Bladder was nonpalpable. GU:  Phallus was circumcised.  A Foley catheter was present.  There was no discharge.  Scrotal skin was normal.  Testicles were atrophic, nontender.  Normal anal sphincter tone.  Prostate was 1+ in size without nodularity or tenderness.  No rectal masses were present.  EXTREMITIES: He had an Ace wrap around his left BKA site.  There was pedal edema on the right. NEUROLOGIC:  Grossly intact.  IMPRESSION: 1. Urinary retention.  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     The  patient is currently being drained with a Foley catheter.  He     may just have postoperative retention, with him being in a weakened     state.  Currently, Foley catheter is present, but I think be it     would be worthwhile eventually to consider teaching the patient in-     and-out catheterization. 2. Urinary tract infection, Klebsiella, currently on Cipro, treated     appropriately.  PLAN: 1. I think it worthwhile to start the patient on bethanechol.  He     tells me that his discharge date will be in approximately 4 days. 2. I will write for him to have a voiding trial starting in 2 days.     We can check bladder scans.  If he needs to  be drained, this will     be best performed with in-and-out catheterization. 3. He does need long-term bladder drainage, I think, with his     diabetic, he will be much better of using a intermittent catheter     scheduled and placing a Foley catheter. 4..  The patient may eventually need outpatient followup with [facility] in our office.  It that is necessary, we will arrange.     Bertram Millard. Retta Diones, M.D.     SMD/MEDQ  D:  12/05/2010  T:  12/06/2010  Job:  [id number]  Electronically Signed by Marcine Matar M.D. on 12/18/2010 06:04:25 PM

## 2010-12-31 NOTE — Discharge Summary (Signed)
[name], [name]             ACCOUNT NO.:  [id number]  MEDICAL RECORD NO.:  [id number]          PATIENT TYPE:  IPS  LOCATION:  4007                         FACILITY:  [facility]  PHYSICIAN:  Ranelle Oyster, M.D.DATE OF BIRTH:  09/10/1966  DATE OF ADMISSION:  11/29/2010 DATE OF DISCHARGE:  12/10/2010                              DISCHARGE SUMMARY   DISCHARGE DIAGNOSES: 1. Polymicrobial infection, left foot ulcer with osteomyelitis     requiring left below-knee amputation. 2. Charcot feet. 3. Diabetes mellitus type 2. 4. Acute renal failure, resolving. 5. Urinary retention, resolved. 6. Klebsiella urinary tract infection, treated. 7. Congestive heart failure, compensated. 8. Coronary artery disease. 9. Morbid obesity. 10.Obstructive sleep apnea.  HISTORY OF PRESENT ILLNESS:  [name] is a 45 year old male with history of diabetes mellitus, coronary artery disease, bilateral Charcot feet with foot ulcers on left treated with Augmentin for few weeks.  The patient was admitted to [facility] on December 8 with fevers, diarrhea and evidence of sepsis.  He was started on IV antibiotics and underwent I and D with cultures of left foot revealing Enterobacter, MRSA, and group G strep.  X-rays done showed evidence of osteo at left MP and cuboid. On January 5, patient elected to undergo left BKA by Dr. Archie Endo.  The patient's hospital course was complicated by fluid overload. Cardiology has followed the patient, and the patient's diuretics were increased to help with diuresis.  The patient was also noted to have C diff colitis at admission and has been treated with p.o. vancomycin. Diarrhea is resolved.  The patient with issues regarding urinary retention that is reported to be resolving.  Acute on chronic renal failure has resolved with resolution of diarrhea.  The patient was evaluated by Rehab, and it was felt that he would benefit from a CIR program.  PAST MEDICAL HISTORY:   Significant for DM type 2 with peripheral neuropathy, nephropathy, and retinopathy, cardiomyopathy, history of CHF, coronary artery disease with metal stents x2, obesity, gastroparesis, chronic kidney disease with baseline creatinine at 1.5 to 1.8, bilateral foot ulcers, history of MRSA of bilateral lower extremities, retinal hemorrhages, obstructive sleep apnea, bilateral Charcot feet, gout, moderate restrictive lung disease, recurrent sciatica, and hypertension.  ALLERGIES:  ACE, SULFA, LOVASTATIN, PRAVASTATIN.  REVIEW OF SYMPTOMS:  Positive for constipation, wound, weakness.  FAMILY HISTORY:  Noncontributory.  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 Lives in a 1-level home with four steps at entry.  Does not use any tobacco or alcohol.  FUNCTIONAL HISTORY:  The patient was independent with occasional assist for lower body dressing.  FUNCTIONAL STATUS:  The patient is contact guard assist for sit to stand, contact guard assist transfers, min assist for taking 8 eight steps.  He is contact guard assist to transfer to bedside commode and setup for grooming.  PHYSICAL EXAMINATION:  VITAL SIGNS:  Blood pressure 118/81, pulse 70, respirations 18, temperature 98.2. GENERAL:  The patient is an obese male, alert and oriented x3, in no acute distress. HEENT:  Pupils equal, round, reactive to light.  Oral mucosa is pink and moist with good dentition.  Nares patent. NECK:  Supple without JVD or lymphadenopathy. LUNGS:  Clear to auscultation bilaterally without wheezes, rales, or rhonchi. HEART:  Regular rate and rhythm without murmurs or gallops.  Distant heart sounds. ABDOMEN:  Soft and nontender, mild distention noted.  Positive bowel sounds. EXTREMITIES:  With left BKA stump that is clean and intact with sutures in place.  Area well approximated without drainage.  Right lower extremity is notable for Charcot deformity of foot with significant callus  formation over medial arch and some early breakdown. SKIN:  Noted to be rather dry. NEUROLOGIC:  The patient with decreased sensation, right foot, to light touch which persisted up to ankle region.  No obvious sensory loss in hands.  Normal cranial nerve exam.  Reflexes 1+.  Motor exam is notable for 5/5 strength in upper extremity, right lower extremity is 4 to 5 out of 5 except for some limitation at right ankle due to his orthopedic deformity.  Cognition intact with good insight and awareness.  Memory excellent.  HOSPITAL COURSE:  [name] was admitted to Rehab on November 29, 2010, for inpatient therapies to consist of PT, OT at least 3 hours 5 days a week.  Past admission physiatrist, rehab RN and therapy team have worked together to provide customized collaborative interdisciplinary care.  The patient's pain was managed with p.r.n. use of oxycodone.  Rehab RN has worked with the patient on bowel and bladder program.  On p.m. past admission, patient reported problems with voiding.  He was cathed for 1300 mL urine.  The patient was noted to have intermittent difficulty with voiding with PVRs at 40 and up to 500 mL.  A UA/UC was done.  The patient was noted to have Klebsiella UTI and he was treated with Cipro for this.  The patient's blood pressures have been monitored on a b.i.d. basis.  They were initially noted to be from 100-110s range systolic.  Routine check of lytes were also done, showing the patient to have worsening in renal status with BUN rising to 103 and creatinine to 4.0.  He was noted to have hyperkalemia with potassium at 6.5.  He was treated with Kayexalate and placed on restricted potassium diet.  Dr. Caryn Section with Nephrology was consulted for input.  He recommended discontinuing Coreg to 3.125 mg b.i.d.  He also recommended restarting Demadex and check of renal ultrasound.  Renal ultrasound done showed normal appearance of kidneys bilaterally and abdominal  ascites.  Blood pressures were continued to be checked on b.i.d. basis with recommendations to hold Coreg for systolic blood pressure less than 120. A Foley was placed for renal bladder decompression due to Renal input. Nephrology felt, patient's renal status was worsening due to his retention problems as well as hypotension.  Urology was consulted for input regarding neurogenic bladder.  They recommended starting Urecholine as well as discontinuing Foley and initiating a voiding trial.  Foley was discontinued on January 20, and PVRs were checked. The patient was noted to be voiding high volumes with PVRs at 12-120 mL. Urology recommends okay to send home off Urecholine, and they will follow up with the patient in 4-6 weeks.  The patient's left BKA site has been monitored, and this is noted to be healing well without any signs or symptoms of infection.  The patient's p.o. intake is slowly improving.  Blood sugars have been checked on a.c. and bedtime basis, and these are steadily on an upward trend.  The patient was maintained on Lantus insulin,  which was slowly titrated upwards to 37 units, additionally meal coverage was increased to 15 units t.i.d. a.c.  Blood sugars at time of discharge are ranging from 250s to 330 range.  Patient is advised to resume his U-500 at home as he feels like this works better for him.  He is also advised to start on half-normal dose.  Check blood sugars on a 24-hour period.  If blood sugars consistently were 200, to slowly titrate upwards to his normal dose.  He is also to contact Dr. Gardiner Ramus for further input regarding his diabetes management.  During the patient's stay in rehab, weekly team conference was held to monitor patient's progress, set goals as well as discussed barriers to discharge.  At the time of admission, patient was noted to have decreased strength, decreased balance, decreased endurance, and decreased independence in all  functional mobility.  He required min to mod assist for standing.  Min assist for 2- 3 hopping steps.  He could ambulate in parallel bars, 6 feet forward and backward with min assist.  The patient's mobility and endurance are greatly improving.  Currently, patient is modified independent for transfers.  He is modified independent for wheelchair mobility.  He requires supervision for car transfers, supervision for ambulating up to 10 feet with a rolling walker.  Family education has been done with wife who is to provide supervision assistance as needed.  Occupational Therapy has been working with the patient on self-care tasks. Initially, patient required up to mod assist for lower body care.  OT has worked with the patient on donning and doffing shrinker as well as care of left BKA as well as right lower extremity.  Currently, patient is modified independent for all bathing and dressing tasks.  Further followup, home health, PT/OT, and RN have been set up through [facility].  On December 10, 2009, patient is discharged to home.  DISCHARGE MEDICATIONS: 1. Tylenol 325-650 mg p.o. q.4 h. p.r.n. mild pain. 2. Albuterol inhaler 2 puffs q.4 h. p.r.n. shortness of breath. 3. Urecholine 25 mg p.o. b.i.d. times 5 days and one per day times 5     days. 4. Coreg 3.125 mg p.o. b.i.d. 5. Flonase 2 sprays in each nostril daily. 6. Nizoral cream to affected areas b.i.d. 7. OxyIR 5 mg 2-3 pills p.o. q.6 h. p.r.n. moderate-to-severe pain,     #90 prescribed. 8. MiraLax 17 g in 8 ounces daily as needed for constipation. 9. Senokot-S two p.o. b.i.d. 10.U-500 insulin 5-6 units subcu t.i.d. for now, to slowly increase to     home dose depending on CBG checks. 11.Allopurinol 100 mg p.o. per day. 12.Coated aspirin 81 mg a day. 13.Demadex 20 mg b.i.d. 14.Fish oil tabs b.i.d. 15.Mag ox 400 mg a day. 16.Neurontin 300 mg b.i.d. 17.Plavix 75 mg a day. 18.Reglan 5 mg a.c. t.i.d. 19.Valium 5 mg half  p.o. per day p.r.n. anxiety. 20.Zegerid 40-1100 one cap p.o. per day. 21.Colchicine 0.6 mg p.o. per day  Diet is carb-modified medium with low potassium restrictions.  Activity level is intermittent supervision and supervision for ambulation.  SPECIAL INSTRUCTIONS:  No alcohol, no smoking, no driving.  Do not use Amaryl, spironolactone, metolazone, and Coreg 12.5 mg.  Drink plenty of fluids, with support stockings right leg and compression stocking left amputation site.  [facility] to provide PT/OT and RN.  FOLLOWUP:  The patient is to follow up with Dr. Riley Kill on March 12, 11 o'clock for 11:30 appointment.  Follow up  with Dr. [name] for routine check in 2 weeks.  Follow up with Dr. Gardiner Ramus for diabetes management in 2 weeks.  Follow up with Dr. Marcine Matar, Urology, in 4 weeks.  Follow up with Dr. Clair Gulling in Cardiology for routine check.  Follow up with Dr. Archie Endo for postop check.     Delle Reining, P.A.   ______________________________ Ranelle Oyster, M.D.    PL/MEDQ  D:  12/10/2010  T:  12/11/2010  Job:  [id number]  cc:   [name], M.D. Sharon Seller, M.D. Gardiner Ramus, M.D. Bertram Millard. Dahlstedt, M.D. Archie Endo, M.D.  Electronically Signed by Osvaldo Shipper. on 12/18/2010 03:25:43 PM Electronically Signed by Faith Rogue M.D. on 12/31/2010 04:31:46 PM

## 2011-01-13 ENCOUNTER — Emergency Department (HOSPITAL_COMMUNITY)
Admission: EM | Admit: 2011-01-13 | Discharge: 2011-01-13 | Disposition: A | Discharge status: Home / Self Care | Payer: Medicare (Managed Care) | Attending: Emergency Medicine | Admitting: Emergency Medicine

## 2011-01-13 DIAGNOSIS — M25539 Pain in unspecified wrist: Secondary | ICD-10-CM | POA: Insufficient documentation

## 2011-01-13 DIAGNOSIS — Z7982 Long term (current) use of aspirin: Secondary | ICD-10-CM | POA: Insufficient documentation

## 2011-01-13 DIAGNOSIS — S88119A Complete traumatic amputation at level between knee and ankle, unspecified lower leg, initial encounter: Secondary | ICD-10-CM | POA: Insufficient documentation

## 2011-01-13 DIAGNOSIS — I509 Heart failure, unspecified: Secondary | ICD-10-CM | POA: Insufficient documentation

## 2011-01-13 DIAGNOSIS — R5383 Other fatigue: Secondary | ICD-10-CM | POA: Insufficient documentation

## 2011-01-13 DIAGNOSIS — I10 Essential (primary) hypertension: Secondary | ICD-10-CM | POA: Insufficient documentation

## 2011-01-13 DIAGNOSIS — E119 Type 2 diabetes mellitus without complications: Secondary | ICD-10-CM | POA: Insufficient documentation

## 2011-01-13 DIAGNOSIS — I2510 Atherosclerotic heart disease of native coronary artery without angina pectoris: Secondary | ICD-10-CM | POA: Insufficient documentation

## 2011-01-13 DIAGNOSIS — Z79899 Other long term (current) drug therapy: Secondary | ICD-10-CM | POA: Insufficient documentation

## 2011-01-13 DIAGNOSIS — Z794 Long term (current) use of insulin: Secondary | ICD-10-CM | POA: Insufficient documentation

## 2011-01-13 DIAGNOSIS — R5381 Other malaise: Secondary | ICD-10-CM | POA: Insufficient documentation

## 2011-01-13 DIAGNOSIS — M25569 Pain in unspecified knee: Secondary | ICD-10-CM | POA: Insufficient documentation

## 2011-01-13 LAB — POCT I-STAT, CHEM 8
Calcium, Ion: 0.99 mmol/L — ABNORMAL LOW (ref 1.12–1.32)
HCT: 34 % — ABNORMAL LOW (ref 39.0–52.0)
Sodium: 139 mEq/L (ref 135–145)
TCO2: 23 mmol/L (ref 0–100)

## 2011-01-13 LAB — GLUCOSE, CAPILLARY: Glucose-Capillary: 132 mg/dL — ABNORMAL HIGH (ref 70–99)

## 2011-01-27 ENCOUNTER — Inpatient Hospital Stay: Payer: Medicare (Managed Care) | Admitting: Physical Medicine & Rehabilitation

## 2011-01-27 ENCOUNTER — Encounter: Payer: Medicare (Managed Care) | Attending: Physical Medicine & Rehabilitation

## 2011-04-01 NOTE — Consult Note (Signed)
[name], [name]             ACCOUNT NO.:  [id number]   MEDICAL RECORD NO.:  [id number]          PATIENT TYPE:  REC   LOCATION:  FOOT                         FACILITY:  [facility]   PHYSICIAN:  Jonelle Sports. Sevier, M.D. DATE OF BIRTH:  06/02/1966   DATE OF CONSULTATION:  06/25/2007  DATE OF DISCHARGE:                                 CONSULTATION   HISTORY OF PRESENT ILLNESS:  This 45 year old black male has been  followed for a medial right lower calf ulcer that developed at the that  site of a previous infection in the face of obesity with some venous  hypertension.  He has been treated with compressive wraps.  He reports  no problems since his last visit here a week ago.  Specifically, no  awareness of drainage, odor, fever or other systemic symptoms.   Blood pressure 136/92, pulse 83, temperature 98.3, respirations 18,  random blood sugar 80.   The wound itself which is irregular in shape has minimal opening areas  of about 0.1 cm at two different areas but there is no drainage.  The  remainder of the wound bed is well granulated and epithelialized.   IMPRESSION:  Near healing ulcer right lower extremity.   DISPOSITION:  It is my feeling that this wound will heal simply with  continued use of compression hose at this point and accordingly it is  covered with a leave-in pad for protection, and he is advised to cleanse  the wound daily gently and to replace the leave-in pad until it is fully  healed.  He understands that this is to protect him from trauma while  pulling on the stockings, and also should he bump that area against to  some other object.   He is instructed in some detail about the proper use of the compression  hose.   He will return here on a p.r.n. basis.           ______________________________  Jonelle Sports Cheryll Cockayne, M.D.     RES/MEDQ  D:  06/25/2007  T:  06/25/2007  Job:  [id number]

## 2011-04-01 NOTE — H&P (Signed)
NAMEAVERIE, MEINER             ACCOUNT NO.:  [id number]   MEDICAL RECORD NO.:  [id number]          PATIENT TYPE:  OBV   LOCATION:  3708                         FACILITY:  [facility]   PHYSICIAN:  Brayton El, MD    DATE OF BIRTH:  1966/10/15   DATE OF ADMISSION:  09/19/2008  DATE OF DISCHARGE:                              HISTORY & PHYSICAL   CHIEF COMPLAINT:  Chest pain.   HISTORY OF PRESENT ILLNESS:  Mr. Kimbrell is a 45 year old black male  past medical history significant for coronary artery disease status post  anterior MI and PCI x2 approximately 2 years ago, hypertension,  diabetes, hyperlipidemia, presenting with new onset chest pain today.  He states that today while driving his car, he had acute onset of sharp  right-sided chest pain that radiated down the posterior aspect of his  right arm.  The patient states that there were there were no associated  symptoms.  Specifically denies any shortness of breath, nausea, vomiting  or diaphoresis.  He states that there were no aggravating factors  related to this chest pain.  He states that it was unremitting for  several hours and he is currently still feeling a very slight tinge of  pain in this right chest.  The patient does state that nitroglycerin  provided only very mild relief.  The patient states that he recently has  been in his normal state of health and has not had any episodes of chest  pain other than today.  In the emergency room, the patient's  cardiologist at [facility] Dr. Lupita Shutter was contacted.  He stated that he  wished that the patient be admitted and ruled out for myocardial  infarction, that he would like see the patient as an outpatient assuming  the chest pain resolves and he does rule out.   PAST MEDICAL HISTORY:  As above in HPI, in addition, the patient also  has a history of arthritis.   FAMILY HISTORY:  Negative premature coronary disease.   SOCIAL HISTORY:  No tobacco, no alcohol.   ALLERGIES:   SEPTRA.   MEDICATIONS:  1. Aspirin 81 mg daily.  2. Coreg 6.25 mg b.i.d.  3. Torsemide 20 mg b.i.d.  4. Aldactone 25 mg daily.  5. Pravastatin 40 mg daily.  6. Prevacid 30 mg daily.  7. Diazepam.  8. Gabapentin 300 mg b.i.d.  9. Lantus 63 units subcu b.i.d.  10.Humalog sliding scale.  11.Reglan.  12.Omega three fatty acids.  13.Percocet.   REVIEW OF SYSTEMS:  Positive for the right hand pain and weakness that  is worse at night.  Also, positive for pain in his left wrist that he  believes is related to arthritis.  Other systems as in HPI.  Otherwise,  all other systems are negative.   PHYSICAL EXAMINATION:  VITAL SIGNS:  Temperature 97.9, pulse 77,  respirations 14, blood pressure 111/71, sating 98% on room air.  GENERAL:  No acute distress.  HEENT:  Pupils equally round, react to light.  NECK:  Supple.  There are no carotid bruits.  There is no JVD.  HEART:  Regular  rate and rhythm without murmur or gallop.  LUNGS:  Clear to auscultation bilaterally.  ABDOMEN:  Soft, nontender, nondistended.  EXTREMITIES:  Without edema.  There is no edema and the right wrist is  tender to palpation.  NEUROLOGICAL:  Nonfocal.  SKIN:  Warm and dry.  PSYCHIATRIC:  Patient is appropriate with normal levels of insight.   LABORATORY DATA:  Sodium 140, potassium 3.8, chloride 102, BUN 53,  creatinine 1.8, glucose 214.  White count 7, hemoglobin 13.5, hematocrit  40.1, platelet count 257.  His INR is 1.0.  Troponin is 0.01.  CK is  464, MB of 12.1, a relative index is 2.6.  BNP is 200.  EKG  independently read by myself demonstrates normal sinus rhythm with a  right bundle branch block, anterior septal Q-waves and a nonspecific T-  wave abnormalities.   IMPRESSION:  The patient appears to be having atypical chest pain, but  does have a history of known coronary disease and several risk factors.  He is currently still having some mild chest pain and has been having it  for several hours.   However, his troponin is completely normal.  In  addition to the chest pain, the patient also appears to be azotemia with  an increased BUN and creatinine ratio.  However it is unclear whether  this diagnosis is new or not.   PLAN:  Will admit him to telemetry unit, rule him out for myocardial  infarction.  We will place him on aspirin, IV heparin, statin, beta  blocker.  We will hold his diuretic therapy temporarily and hydrate him  with normal saline 150 mL an hour as he does appear to have some  prerenal azotemia.  We will place him on sliding scale insulin for  management of his diabetes.  If the patient were to rule out tomorrow  and his chest pain is resolved, he would likely be safe to discharge to  be seen soon by his primary cardiologist at [facility].      Brayton El, MD  Electronically Signed     SGA/MEDQ  D:  09/19/2008  T:  09/20/2008  Job:  [id number]

## 2011-04-01 NOTE — Assessment & Plan Note (Signed)
[facility]   NAMEJERRID, [name]             ACCOUNT NO.:  [id number]   MEDICAL RECORD NO.:  [id number]      DATE OF BIRTH:  [birth year]   PHYSICIAN:  Jake Shark A. Tanda Rockers, M.D.      VISIT DATE:                                   OFFICE VISIT   SUBJECTIVE:  [name] is a 45 year old man who we are seeing for  stasis ulcer.  During the interim, he has been treated with a  compression wrap.  He denies excessive drainage, malodor, pain or fever.   OBJECTIVE:  Blood pressure is 146/96, respirations are 18, pulse of 91,  temperature 97.9, capillary blood glucose is 120 mg percent.  Inspection  of the right medial lower extremity shows that there is a 100%  granulated wound, with advancing epithelium from the periphery.  There  is no evidence of active infection.  There is no evidence of ischemia.   ASSESSMENT:  Clinically improved stasis ulcer.   PLAN:  We have placed the patient in a Unna boot.  We have given him a  prescription for the procurement of bilateral below-the-knee open toe 30  to 40-mm compression hose.  We have instructed the patient to bring  these stockings with him during his next visit, as we anticipate he  should be resolved.  We have given the patient opportunity to ask  questions.  We have explained the use of compression in the long-term  management of stasis.  The patient seems to understand, indicates that  he will be compliant.  We will see him in 10 days      Harold A. Tanda Rockers, M.D.  Electronically Signed     HAN/MEDQ  D:  06/09/2007  T:  06/09/2007  Job:  [id number]

## 2011-04-01 NOTE — Assessment & Plan Note (Signed)
[facility]   NAME:  [name], [name]             ACCOUNT NO.:  [id number]   MEDICAL RECORD NO.:  [id number]      DATE OF BIRTH:  23-Dec-1965   PHYSICIAN:  Maxwell Caul, M.D. VISIT DATE:  05/27/2007                                   OFFICE VISIT   LOCATION:  [facility].   [name] is seen here in follow-up from his post surgical wound.  This initially underwent an I&D when he was at [facility] for treatment of  congestive heart failure.  When he was seen here last, he had a culture  done that ultimately showed methicillin-sensitive staph aureus; we  started him on Keflex on May 24, 2007 at 500 mg p.o. q.i.d. times 10  days.  In the meantime, he has continued with an Radio broadcast assistant wrap.  His  ABIs did indeed show that he should be able to tolerate this.  He did  develop some bleeding under the wrap last night and took the wrap off.  He is here today in wound care follow-up.   WOUND EXAM:  Actually shows a somewhat complex wound on his right leg.  The measurements are 2.5 x 2 x 0.3.  Most of this wound appears  granulated, although there is some to depth. At the superior aspect of  the wound this actually had a necrotic covering that underwent a full-  thickness debridement.  There is considerably more depth to this  superior recess than the rest of the wound.  We used a #15 blade for the  debridement with EMLA for anesthesia.  Hemostasis was not necessary.  There was nothing that appeared to need culture.  Presumably, this is  where the deeper part of the I&D was.  Again, there is no obvious  infection here.   IMPRESSION:  Post surgical wound,  possibly wound abscess or an infected  hematoma initially.  He is now on the Keflex for methicillin-sensitive  staph aureus, which we have called in.  The surrounding tissue did not  appear to be infected and there was nothing that appeared to need  culture today.  We have applied Aquacel AG to provide  for topical  antibiotic therapy and I have reapplied the Unna boot wrap.  I think the  bleeding he described probably just was from traumatize granulation  tissue. Nothing appeared to be ominous here.  We will see him again next  week.           ______________________________  Maxwell Caul, M.D.     MGR/MEDQ  D:  05/27/2007  T:  05/27/2007  Job:  [id number]

## 2011-04-01 NOTE — H&P (Signed)
[name] NO.:  [id number]   MEDICAL RECORD NO.:  [id number]          PATIENT TYPE:  EMS   LOCATION:  ED                           FACILITY:  [facility]   PHYSICIAN:  Hettie Holstein, D.O.    DATE OF BIRTH:  Apr 15, 1966   DATE OF ADMISSION:  04/16/2007  DATE OF DISCHARGE:                              HISTORY & PHYSICAL   PRIMARY CARE PHYSICIAN/CARDIOLOGIST:  [name], Dr. Leavy Cella. He  does have a cardiologist there, [name].   CHIEF COMPLAINT:  Fevers and chills.   HISTORY OF PRESENT ILLNESS:  [name] is a 45 year old male with  history of coronary artery disease status post MI in January 2008 status  post cardiac stents placed and depressed ejection fraction.  Apparently  this is improved from 25-40% according to the family. I do not have any  of the [facility] records available, I will request these now. In any event,  in [place] a couple of weeks ago he presented with fevers and chills.  Apparently they felt he was dehydrated and provided generous IV fluids  and subsequently he presented to [facility] the following Monday.  His cardiologist told him that he was 16 pounds positive. He underwent  diuresis, in addition incision and drainage of abscess of his right  lower extremity. He had been on IV antibiotics during that course and  duration. Further details will be provided once these records have been  sent. In any event, he was discharged on Monday on p.o. Bactrim. He  subsequently developed firmness and tenderness as well as heat and pain  in his right lower extremity. His temperature in the emergency  department was 102.7. In any event, he presents for further evaluation  and declines returning to [facility] at this time.  He states that this is too  far. We are in the process of attempting to consult orthopedic surgery  for opinion.   PAST MEDICAL HISTORY:  Significant for coronary artery disease status  post MI in January 2008, status  post stenting by [name], diabetes,  hypertension, chronic renal insufficiency with elevated creatinine.  History of groin infection status post surgery at the time.   MEDICATIONS:  1. Plavix 75 mg daily.  2. Bactrim DS twice daily.  3. Carvedilol, he was formerly on 25, his cardiologist told him to      decrease this to 12.5 b.i.d.  4. Spironolactone 25 mg once a day.  5. Claritin 10 mg daily.  6. Prevacid 30 mg daily.  7. Pravastatin 40 mg daily.  8. Lasix 120 mg twice daily.  9. Nystatin 1 teaspoon four times a day.  10.Ventolin 2 puffs as needed.  11.Magnesium oxide 400 mg daily.  12.Fluticasone propionate as needed.   ALLERGIES:  No known drug allergies.   SOCIAL HISTORY:  He does not drink or smoke. Formerly worked as a Investment banker, operational.  He is married with two children.   FAMILY HISTORY:  Mother is alive and well.  No known health issues.  She  is age 96.  He does not know his father's history.   REVIEW  OF SYSTEMS:  He had been in his usual state of health prior to  this event with the exception of some constipation.  Denies any chest  pain, nausea or vomiting.   PHYSICAL EXAMINATION:  VITAL SIGNS:  In the department, his vital signs  were stable.  He was febrile with a temperature of 102.7, blood pressure  143/98, heart rate 90, respirations 20, O2 saturations 96%.  GENERAL:  The patient is alert and oriented x3.  HEENT:  Reveals the head to be normocephalic, atraumatic.  Extraocular  muscle intact.  NECK:  Supple, nontender, no palpable thyromegaly or mass.  CARDIOVASCULAR:  Normal S1 and S2.  LUNGS:  Clear bilaterally.  ABDOMEN:  Soft, nontender, obese.  EXTREMITIES:  Right lower extremity reveals open and pink non necrotic  appearing medial calf wound with more proximally surrounding calf  tenderness and firmness with heat to touch. The wound appears clean. He  is able to move his lower extremities.  Dorsalis pedis pulses are  palpable.   ASSESSMENT:  1. Right lower  extremity wound status post I&D at [facility].  2. Cellulitis.  Rule out deep venous thrombosis. I spoke with Dr.      Thurston Hole this evening who recommended placing him on antibiotics and      sending him back to [facility] in the morning.  3. Coronary artery disease status post two stents.  4. Congestive heart failure.  5. Obesity.  6. Hypercholesterolemia.  7. Chronic renal insufficiency, uncertain of baseline.   PLAN:  Will administer empiric IV antibiotics, check a lower extremity  Doppler, obtain orthopedic consultation and wound care consultation and  attempt to transfer the patient in the morning to [facility].      Hettie Holstein, D.O.  Electronically Signed     ESS/MEDQ  D:  04/16/2007  T:  04/16/2007  Job:  [id number]

## 2011-04-01 NOTE — Discharge Summary (Signed)
[name], [name]             ACCOUNT NO.:  [id number]   MEDICAL RECORD NO.:  [id number]          PATIENT TYPE:  OBV   LOCATION:  3708                         FACILITY:  [facility]   PHYSICIAN:  Elmore Guise., M.D.DATE OF BIRTH:  Oct 12, 1966   DATE OF ADMISSION:  09/19/2008  DATE OF DISCHARGE:  09/20/2008                               DISCHARGE SUMMARY   DISCHARGE DIAGNOSES:  1. Atypical chest pain.  2. History of known coronary artery disease.  3. History of left ventricular dysfunction.  4. Obesity.  5. Diabetes mellitus.  6. Dyslipidemia.  7. Chronic renal insufficiency.   HISTORY OF PRESENT ILLNESS:  [name] is a 45 year old African  American male with multiple medical problems who presented with sharp  right-sided chest pain.  He was admitted overnight for observation.   HOSPITAL COURSE:  The patient's hospital course was uncomplicated.  He  became chest painfree while in the emergency room.  He has not needed  any nitroglycerin or pain medications overnight.  His troponins were all  negative and his MB percent also was negative.  He appeared mildly  dehydrated on admission and was hydrated very gently overnight.  His  initial BUN and creatinine were 53 and 1.8.  These improved of 51 and  1.5 on hospital day #2.  The patient is totally asymptomatic at current  time.  His blood pressure has been well controlled with systolics  ranging from 120-130 over 80s and heart rates have been in the 70s and  80s.  His BNP level was 200.  His fingerstick blood sugars ranged from  100-160.   He will be discharged home today to continue the following medications:  1. Aspirin 81 mg daily.  2. Coreg 6.25 mg twice daily.  3. Gabapentin 300 mg twice daily.  4. Glargine insulin 63 units twice daily.  5. Humalog 10 units 3 times daily with meals.  6. Prevacid 30 mg daily.  7. Magnesium oxide 400 mg daily.  8. Metoclopramide (Reglan) 10 mg 1 tablet 3 times daily.  9. Zaroxolyn 5  mg 1 tablet 1-2 times for weekly as needed for      increased fluid.  10.Pravachol 40 mg daily.  11.Omega-3 fish oil 1 tablet 2 times daily.  12.Spironolactone 25 mg daily.  13.Torsemide 20 mg 2 tablets 2 times daily.  14.Oxycodone for pain.  15.Senna as needed for constipation.   I did discuss CHF restrictions and instruction sheet was given.  He  should do daily weights in the morning if he has increasing shortness of  breath or lower extremity edema, he has to take his Zaroxolyn.  He is  well versed in this.  Unless, he has any further questions or concerns,  he will be discharged home later today.  He will follow up with his  primary cardiologist, Dr. Lupita [name] at [facility].  He already has a  scheduled office visit within the next week.      Elmore Guise., M.D.  Electronically Signed     TWK/MEDQ  D:  09/20/2008  T:  09/21/2008  Job:  [id number]  cc:   Francesco Runner, MD

## 2011-04-01 NOTE — Assessment & Plan Note (Signed)
[facility]   NAMEMarland Kitchen  [name], [name]             ACCOUNT NO.:  [id number]   MEDICAL RECORD NO.:  [id number]      DATE OF BIRTH:  [birth year]   PHYSICIAN:  Maxwell Caul, M.D. VISIT DATE:  06/18/2007                                   OFFICE VISIT   PURPOSE TODAY'S VISIT:  Follow-up of venous stasis ulcer on his right  anterior leg.  In the meantime, he has continued in an Radio broadcast assistant wrap.  He has remained relatively asymptomatic, although he is anxious to  transition to his graded pressure stocking which he brought today.   EXAMINATION:  Temperature 97.8, pulse 94, respirations 18, blood  pressure is 133/84.  The wound itself shows a granulated wound; however,  there is still an open area.  This measures 0.7 x 0.6 x 0.1.  Unfortunately, I do not think we can graduate him into the graded  pressure stockings as of yet.  The patient was disappointed, however,  there has been considerable improvement in the dimensions of this wound  and I do not to undo things in order to rush him into a stocking.   PLAN:  We placed the patient back in an Unna boot wrap.  I think this  will close over and I have told the patient this.  He will return in a  week for follow-up.           ______________________________  Maxwell Caul, M.D.     MGR/MEDQ  D:  06/18/2007  T:  06/18/2007  Job:  [id number]

## 2011-04-01 NOTE — Discharge Summary (Signed)
[name], [name]             ACCOUNT NO.:  [id number]   MEDICAL RECORD NO.:  [id number]          PATIENT TYPE:  INP   LOCATION:  1620                         FACILITY:  [facility]   PHYSICIAN:  Lonia Blood, M.D.       DATE OF BIRTH:  1966/05/28   DATE OF ADMISSION:  04/16/2007  DATE OF DISCHARGE:  04/22/2007                               DISCHARGE SUMMARY   PRIMARY CARE PHYSICIAN:  Will become Dr. Margaretmary Bayley.   DISCHARGE DIAGNOSES:  1. Methicillin-sensitive Staphylococcus aureus abscess of the right      lower extremity, on current wound vacuum therapy  2. Coronary artery disease, status post stent placement.  3. Congestive heart failure.  4. Diabetes mellitus, type 2.  5. Hypertension.  6. Chronic kidney disease.  7. Hyperlipidemia.  8. Right upper lobe lung nodule with normal PET scan.  9. History of viral meningitis in 2003.   DISCHARGE MEDICATIONS:  1. Keflex 500 mg per mouth three times a day for 1 week.  2. Plavix 75 mg daily.  3. Coreg 12.5 mg twice a day.  4. Spironolactone 25 mg daily.  5. Claritin 10 mg daily.  6. Prevacid 30 mg daily.  7. Pravastatin 40 mg daily.  8. Lasix 120 mg twice a day.  9. Albuterol two puffs as needed.  10.Fluticasone as needed.  11.Lantus 70 units at bedtime.  12.NovoLog sliding-scale insulin.   CONDITION ON DISCHARGE:  [name] was discharged in good condition.  At the time of the discharge, the patient was instructed to follow up  with his new primary care physician, Dr. Margaretmary Bayley.  The patient was  also set up with home health wound care and the patient was also offered  the possibility to follow up with Dr. Jerl Santos from Orthopedics if  needed.   PROCEDURES DURING THIS ADMISSION:  1. On Apr 16, 2007, the patient underwent a computed tomographic scan      of the right lower extremity with the findings of edema of the      right lower leg, no intramuscular abscess or compartment syndrome.  2. April 19, 2007, computed  tomographic scan of the neck with findings      of no retropharyngeal abscess or phlegmon.  3. Portable chest x-ray:  Findings of right lower lobe atelectasis.   CONSULTATION DURING THIS ADMISSION:  [name] was seen in  consultation by Dr. Marcene Corning from Orthopedics and also by Dr. Cliffton Asters from Infectious Disease.   HISTORY AND PHYSICAL:  For admission history and physical, refer to  dictated H&P done by Dr. Angelena Sole , Apr 16, 2007.   HOSPITAL COURSE:  PROBLEM #1 - RIGHT LOWER EXTREMITY ABSCESS AND  CELLULITIS:  [name] was admitted to acute care units of [facility].  The patient was placed on intravenous antibiotics.  The  area of the abscess was cultured and the culture grew methicillin-  sensitive Staphylococcus aureus.  The patient was treated with  intravenous antibiotics and as soon as he became afebrile, they were  switched to oral antibiotics.  The orthopedic specialist recommended  wound vacuum closing device and this has been ordered in the outpatient  setting.  Infectious Disease specialist has reviewed the case and has  concurred with the antibiotic choice.   PROBLEM #2 - HISTORY CORONARY ARTERY DISEASE, DIABETES MELLITUS,  HYPERLIPIDEMIA, HYPERTENSION AND OBESITY:  All these problems have been  stable during this admission.  We have not adjusted the patient's  medications since he has been fairly euvolemic and with fair diabetes  control.  The patient was stressed the importance of finding a primary  care physician and following up on a short-term basis.      Lonia Blood, M.D.  Electronically Signed     SL/MEDQ  D:  04/25/2007  T:  04/26/2007  Job:  [id number]   cc:   Margaretmary Bayley, M.D.  Fax: [phone]

## 2011-04-01 NOTE — Assessment & Plan Note (Signed)
[facility]   NAMEKARAN, RAMNAUTH             ACCOUNT NO.:  [id number]   MEDICAL RECORD NO.:  [id number]      DATE OF BIRTH:  1966/06/02   PHYSICIAN:  Jake Shark A. Tanda Rockers, M.D. VISIT DATE:  05/20/2007                                   OFFICE VISIT   REASON FOR CONSULTATION:  Mr. Creque is a 45 year old man referred by  Dr. Romero Belling for evaluation of an ulceration involving the right  medial lower extremity.   IMPRESSION:  Stasis ulcer, right medial lower extremity.   RECOMMENDATIONS:  Proceed with an Unna boot protocol utilizing initially  a Silvercel dressing preceded by cultures and sensitivities.   SUBJECTIVE:  Mr. Marchena is a 45 year old man who has had a wound on  his right lower extremity for the past month.  He initially was seen at  [facility] for evaluation and treatment of congestive heart  failure.  During his hospitalization in [place],  it was noted that he  had swelling in both lower extremities and a fluctuant area.  He  subsequently underwent an incision and evacuation of what sounds like an  infected hematoma.  Thereafter, the patient was started on antibiotics  instructed in the performance of irrigations and dressing changes, and  was discharged home to be followed up at [facility].  In the interim, the  patient developed recurrent symptoms of shortness of breath and fever,  and was seen in the emergency room at [facility] and was subsequently  admitted to the hospital.  He was seen in consultation with the in-  service wound nurse.  The patient had a wound vac placed  3 days ago and  has been referred to the wound center for followup.   PAST MEDICAL HISTORY:  Remarkable for severe coronary occlusive disease  and myocardial dysfunction.  He has had multiple stents and required  multiple admissions for management of myocardial insufficiency.  He  reports that he has had at least 2 documented myocardial infarcts.  His  additional  surgery has included an incision and drainage of a groin  abscess several years ago.  He denies allergies.   CURRENT MEDICATION:  1. Insulin per sliding scales.  The patient is unclear as to the      standing dose.  2. Aciphex 20 mg daily.  3. Tenormin 50 mg daily.  4. Dyazide 1 daily.  5. Procardia XL 60 mg daily.  6. Lopid 600 mg b.i.d.  7. Lactulose 30 mL b.i.d.  8. Plavix 75 mg daily.  9. Coreg 12.5 mg b.i.d.  10.Spirolactone 25 mg daily.  11.Prevacid 30 mg daily.  12.Pravastatin 40 mg daily.  13.Lasix 120 mg b.i.d.  14.Aspirin 81 mg daily.   He also takes p.r.n. medication of Vicodin, albuterol, and fluticasone.   FAMILY HISTORY:  Negative for diabetes, cancer, stroke, heart attack.   SOCIALLY:  He is married.  He has 2 children, lives in [place].  He  is medically disabled due to his cardiac complaints.   REVIEW OF SYSTEMS:  He has severely compromised exercise tolerance at  the [facility] Classification III.  He does not smoke.  He denies dysuria  or polyuria.  He denies syncope.  He has had no recent chest pain.  He  denies hemoptysis.  He has lost 15 pounds over the last year.  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  He  currently wears custom inserts.  The remainder of the review of systems  is negative.   PHYSICAL EXAM:  He is a well-developed alert man in good contact with  reality, responding appropriately to inquiry.  The blood pressure is 123/86, respirations 20, pulse rate 84,  temperature 97.9.  Capillary blood glucose is 120 mg percent.  HEENT:  Exam is clear.  The neck is supple.  Trachea is midline.  Thyroid is nonpalpable.  Lungs are clear.  The heart sounds were distant.  Abdomen is soft.  Extremity exam is remarkable for bilateral 2+ edema with chronic changes  of stasis including hyperpigmentation.  The skin is taut and shiny.  There is a  solitary ulceration on the  medial aspect of the lower extremity that was measured, photographed,  and cataloged.  The wound appears to have healthy granulation tissue.  There are remnants of fascia visible, but no debridement was needed.  The pedal pulses are indeterminate due to the degree of edema.  The  patient has decreased sensation in both halluces.  An ankle brachial  index in the clinic was 1 bilaterally.   DISCUSSION:  The patient gives a history consistent with a hematoma,  which was drained and now has the equivalent of a stasis ulcer.  He  currently presents with a wound vac in place.  We are discontinuing the  wound vac as there has not been adequate control of edema.  We will  place him in an absorptive silver dressing with compression wrap.  We  will reevaluate the patient in 1 week.  We have given the patient  opportunity to ask questions.  He seems to understand the aforementioned  recommendations and treatment protocol, and indicates that he will be  compliant.  We will reevaluate him in 1 week.      Harold A. Tanda Rockers, M.D.  Electronically Signed     HAN/MEDQ  D:  05/20/2007  T:  05/20/2007  Job:  [id number]   cc:   Gregary Signs A. Everardo All, MD

## 2011-04-01 NOTE — Assessment & Plan Note (Signed)
[facility]   NAMEKABLE, HAYWOOD             ACCOUNT NO.:  [id number]   MEDICAL RECORD NO.:  [id number]      DATE OF BIRTH:  11-18-1965   PHYSICIAN:  Jake Shark A. Tanda Rockers, M.D. VISIT DATE:  06/02/2007                                   OFFICE VISIT   SUBJECTIVE:  Mr. Garringer is a 45 year old man who we are treating for  post-traumatic stasis ulcer involving the right medial lower leg.  In  the interim we have treated him with external compression and a silver  matrix dressing.  He returns for evaluation.  There has been no interim  fever, excessive pain, or malodor.   OBJECTIVE:  Blood pressure is 131/81, respirations 18, pulse rate 88,  temperature 97.1.  Capillary blood glucose is 67 mg percent.  Inspection  of the right medial lower extremity shows that the edema is well  controlled.  There is obvious contraction of the wound with healthy  granulation.  The wound was photographed, measured, and entered into the  wound expert catalog.  Please refer there too.   ASSESSMENT:  Clinical improvement.   PLAN:  Reinstitution of the Unna wrap.  We will reevaluate the patient  in one week.      Harold A. Tanda Rockers, M.D.  Electronically Signed     HAN/MEDQ  D:  06/02/2007  T:  06/02/2007  Job:  [id number]

## 2011-04-04 NOTE — Discharge Summary (Signed)
NAME:  [name], [name]                       ACCOUNT NO.:  [id number]   MEDICAL RECORD NO.:  [id number]                   PATIENT TYPE:  INP   LOCATION:  0483                                 FACILITY:  [facility]   PHYSICIAN:  Corwin Levins, M.D. [facility]             DATE OF BIRTH:  [birth year]   DATE OF ADMISSION:  07/19/2002  DATE OF DISCHARGE:  07/24/2002                                 DISCHARGE SUMMARY   FINAL DIAGNOSIS:  Viral (aseptic) meningitis.   ADDITIONAL DIAGNOSES:  1. Diabetes.  2. Hypertension.   DISCHARGE MEDICATIONS:  1. Lantus 55 units h.s.  2. Hydrochlorothiazide 25 mg q.d.  3. Imipramine 10 mg h.s.  4. Vioxx 50 mg q.d.  5. Altace 10 mg q.d.  6. Actos 45 mg q.d.   HISTORY OF PRESENT ILLNESS:  The patient is a 45 year old black male who  presented to the office with URI symptoms, including positional vertigo.  He  was treated symptomatically, but returned to the office complaining of  increasing fever, nausea, vomiting, with inability to tolerate oral intake.  He also developed abdominal pain, was subsequently admitted for further  evaluation and treatment.   HOSPITAL COURSE:  The patient was admitted to the hospital where a sepsis  workup was undertaken.  On the second hospital day he spiked fevers to 104  degrees with tachycardia, tachypnea, and the patient was placed on broad  spectrum antibiotic therapy for possible sepsis syndrome.  Evaluation  included CSF studies that revealed a protein of 118 with a glucose of 91.  Color was slightly hazy and yellow with 173 red cells and 211 white cells.  Lymphocytes and monocytes predominated.  He had initial mild leukocytosis,  but quickly this normalized.  Other laboratory studies were fairly  unremarkable.  At the time of discharge, white count was 7.5.  In the  hospital blood sugars were followed, and he was covered with a sliding scale  regimen of Humalog in addition to his h.s. dose of Lantus insulin.  Other  cultures, including blood and urine cultures, returned negative.  Viral  cultures, including herpes simplex virus by PCR were pending at the time of  this dictation.  CT scan of the brain with contrast was obtained that  revealed negative paranasal sinuses with the exception of minimal bilateral  maxillary sinus mucosal thickening.  During the hospital, a right PICC line  was placed for antibiotic therapy.  A CT scan of the abdomen with contrast  was obtained that revealed no mass or adenopathy.  It did reveal scattered  diverticula.    DISPOSITION:  The patient will return to the care of his primary care  physician within the next week.   CONDITION ON DISCHARGE:  Stable, with a low-grade fever, but clinically much  improved.  Improved.        Gordy Savers, M.D. [facility]  Corwin Levins, M.D. [facility]    PFK/MEDQ  D:  07/24/2002  T:  07/24/2002  Job:  [id number]

## 2011-04-04 NOTE — Op Note (Signed)
[facility]  Patient:    [name], [name]                    MRN: [id number] Proc. Date: 02/20/01 Adm. Date:  [id number] Attending:  Charlton Haws                           Operative Report  PREOPERATIVE DIAGNOSIS:  Deep right groin abscess involving adductor.  POSTOPERATIVE DIAGNOSIS:  Deep right groin abscess involving adductor.  OPERATION:  Debridement, irrigation, dressing change of abscess.  SURGEON:  Currie Paris, M.D.  ANESTHESIA:  General endotracheal anesthesia.  CLINICAL HISTORY:  This patient is a 45 year old who was taken to the operating room yesterday for wider debridement and excision of a deep groin abscess.  It involved the adductor canal starting at the groin and going up along the adductor tendon proximally, and down along the adductor distally. He was brought as a planned procedure back to the operating room today for redebridement.  DESCRIPTION OF PROCEDURE:  After satisfactory general endotracheal anesthesia had been obtained, the patient was placed in slight lithotomy position in stirrups and the old dressing was removed.  The wound was prepped and draped with Betadine.  There was still a fair amount of purulent drainage, but markedly decreased from yesterday.  I debrided some dead tissue that was along the adductor tendon.  The muscle appeared to be beefy red, but I thought it was viable.  The Penroses were removed and I irrigated initially with some heparin and peroxide deleted 50/50, followed by some triple antibiotic irrigation.  It irrigated out fairly clean and I elected at this point to put some more Penroses, one proximally and one distally, on the muscle areas, and then packed the wound loosely with some antibiotic soaked Kerlix.  I did culture the purulent material again today. Prior culture had shown only E. coli.  There were no air bubbles to suggest an anaerobic infection and there was virtually  no odor to this.  Patient tolerated the procedure well with no operative complications.  All counts were correct. DD:  02/21/01 TD:  02/21/01 Job: [id number]

## 2011-04-04 NOTE — Consult Note (Signed)
NAME:  [name], [name]                       ACCOUNT NO.:  [id number]   MEDICAL RECORD NO.:  [id number]                   PATIENT TYPE:  OBV   LOCATION:  0379                                 FACILITY:  [facility]   PHYSICIAN:  [place] Bing, M.D.               DATE OF BIRTH:  November 27, 1965   DATE OF CONSULTATION:  DATE OF DISCHARGE:                                   CONSULTATION   HISTORY OF PRESENT ILLNESS:  A 45 year old gentleman with multiple  cardiovascular risk factors including hypertension, diabetes, and  hyperlipidemia who presents with right-sided pleuritic chest pain.  Mr.  [name] has had diabetes for approximately the past 20 years that has been  controlled with oral hypoglycemics plus insulin.  Hypertension likewise has  been controlled medically.  Hyperlipidemia has not been specifically  treated.  He has not had manifest coronary disease.  He recalls no  significant chest pain in the past.  Yesterday, he experienced sudden onset  of sharp right-sided chest discomfort while at work. There was radiation to  the right shoulder and some associated dyspnea.  No diaphoresis was noted.  There was no nausea nor emesis. The patient's appetite has been decreased,  but he says he continues to eat fairly normally.  Symptoms were initially  intermittent, but became constant and more severe, prompting evaluation in  the emergency department.  Initial EKG was negative.  There was a pleuritic  component and some chest wall tenderness.  Total CK was increased with  borderline MB fraction.  Troponin was normal.  With moderate doses of  narcotics, there has been some improvement.  He was treated with  nitroglycerin without apparent benefit.  He has received heparin since  admission.  A D-dimer level was elevated, but CT scan of the chest was  negative except for an apical pulmonary nodule.   PAST MEDICAL HISTORY:  This is notable for viral meningitis late last year  and diverticulosis in  May 2002.   MEDICATIONS ON ADMISSION:  1. Aspirin 325 mg daily.  2. Protonix 40 mg daily.  3. Actos 45 mg daily.  4. Lisinopril 10 mg daily.  5. Hydrochlorothiazide 25 mg daily.  6. Lantus insulin 65 units daily.  7. Vioxx.   SOCIAL HISTORY:  The patient lives in [place], [place], with his  wife.  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  He  has two children.   HABITS:  He has never used tobacco products.  He denies any significant use  of alcohol.  No illicit drugs; specifically denies cocaine.   FAMILY HISTORY:  No coronary disease.   REVIEW OF SYSTEMS:  The patient has had GERD symptoms in the past.  All  other systems are negative.   PHYSICAL EXAMINATION:  GENERAL APPEARANCE:  A pleasant, somnolent gentleman  in mild acute distress.  VITAL SIGNS:  Temperature 97, heart rate 80 and regular, respiratory  rate  20, blood pressure 120/85.  HEENT:  Anicteric sclerae.  NECK:  There is no jugular venous distension; no carotid bruits.  ENDOCRINE:  No thyromegaly.  HEMATOPOIETIC:  No adenopathy.  CHEST:  There is mild tenderness over the right chest.  LUNGS:  A few expiratory rhonchi anteriorly.  CARDIOVASCULAR:  Normal first and second heart sounds. Fourth heart sound  present.  ABDOMEN:  Soft and nontender.  No organomegaly.  EXTREMITIES:  There are decreased pulses in the right foot.  Normal  posterior tibial.  NEUROLOGIC:  Symmetric strength and tone.   EKG shows sinus rhythm, minor nonspecific T-wave abnormality.   LABORATORY DATA:  Initial laboratory is notable for normal CBC.  Creatinine  of 1.3, glucose 313.  Total bilirubin 1.3.  CPK of 296, MB 8.7.  Hemoglobin  A1C 13.3.  D-dimer 1.35.   Chest CT showed 1.5 cm right apical nodule, atelectasis at the lung bases,  more prominent on the right.  Infiltrative disease at the right base could  not be excluded.   IMPRESSION AND PLAN:  [name] presents with right-sided pleuritic  chest  pain.  Incidentally noted is an apical nodule - this probably is not  responsible for his symptoms.  Clinically, his course is most consistent  with pleurisy despite the absence of a prominent viral syndrome.  Myocardial  ischemia is quite unlikely.  An echocardiogram  will be performed to  evaluate for possible pericardial effusion and rule out any functional left  ventricular abnormalities.  He does have elevated CPK, but this is likely of  muscle origin - another set of markers will be obtained.  EKG will be  repeated in the a.m.  It might be useful to perform a stress Cardiolite  study, but this is not required within the next day or two.  A nonsteroidal  anti-inflammatory agent will be added to his regimen to treat presumed  inflammatory etiology for this chest discomfort.  Narcotics will be  continued as needed.  He does not require treatment with heparin, which will  be discontinued.  Metoprolol and aspirin will be continued for now.  With  diabetes, he has a CAD equivalent status and requires lipid lowering  therapy.   We appreciate the request for consultation and will be happy to follow this  nice gentleman with you.                                                [place] Bing, M.D.    RR/MEDQ  D:  04/19/2003  T:  04/19/2003  Job:  [id number]

## 2011-04-04 NOTE — H&P (Signed)
NAME:  [name], [name]                       ACCOUNT NO.:  [id number]   MEDICAL RECORD NO.:  [id number]                   PATIENT TYPE:  INP   LOCATION:  0101                                 FACILITY:  [facility]   PHYSICIAN:  Corwin Levins, M.D. [facility]             DATE OF BIRTH:  [birth year]   DATE OF ADMISSION:  07/19/2002  DATE OF DISCHARGE:                                HISTORY & PHYSICAL   CHIEF COMPLAINT:  Nausea, vomiting, unable to take p.o. medications for 18  hours with URI symptoms and dizziness.   HISTORY OF PRESENT ILLNESS:  A 45 year old black male here after I saw him  in the office yesterday with URI symptoms and vertigo.  Treated with  antibiotics and decongestants.  He requested and I gave samples of Flonase  that he took immediately prior to leaving the office so I can breathe.  Unfortunately, he had increased nausea, vomiting, and unable to keep down  p.o. medications after he left the office.  He came to the emergency room  about 7 p.m. last night for further evaluation with the focus at that time  on abdominal discomfort after he vomited at home.  Laboratories and  ultrasound essentially negative.  He is now admitted for fever and  abdominal pain.   PAST MEDICAL HISTORY:  1. Diabetes mellitus.  2. Hypertension.  3. History of groin abscess April 2002.  4. History of sigmoid diverticulosis by barium enema May 2002.   ALLERGIES:  No known drug allergies.   PAST SURGICAL HISTORY:  None.   MEDICATIONS:  1. Lantus 55 units subcutaneously q.d.  2. HCTZ 25 mg p.o. q.d.  3. Imipramine 10 mg q.h.s.  4. Vioxx 50 mg p.r.n.  5. Altace 10 mg p.o. q.d.  6. Actos 45 mg p.o. q.d.  7. Rhinocort nasal as directed.  8. Zyrtec 10 mg q.d.  9. Aciphex 20 mg q.d.  10.      Phenergan 25 mg p.r.n.   SOCIAL HISTORY:  No tobacco or alcohol.   FAMILY HISTORY:  Hypertension, diabetes.   REVIEW OF SYMPTOMS:  Noncontributory except feet swollen like balloons.   PHYSICAL  EXAMINATION:  GENERAL:  He is at least mild to moderately ill-  appearing, somewhat histrionic __________ with symptoms and complaints out  of proportion to examination.  VITAL SIGNS:  Temperature 100.3, heart rate 101, respirations 20, blood  pressure 164/105.  HEENT:  Bilateral TMs erythematous.  Oropharynx erythematous with  submandibular lymphadenopathy.  NECK:  Without other lymphadenopathy, JVD, thyromegaly.  CHEST:  No rales or wheezing.  CARDIAC:  Regular rate and rhythm.  ABDOMEN:  Mild epigastric tenderness, otherwise benign.  EXTREMITIES:  No edema.   LABORATORIES:  September 2:  Hemoglobin 15.9, white blood cell count 10.9.  Sodium slightly low at 132, potassium 3.5, BUN and creatinine 19 and 1.2,  glucose 285.  LFTs within normal limits.  Amylase, lipase essentially within  normal limits.  September 3 abdominal ultrasound:  Negative.   ASSESSMENT/PLAN:  1. Febrile illness consistent with upper respiratory infection/otitis with     vertigo, nausea, vomiting, and subsequent abdominal pain.  Overall most     likely history and examination with obvious anxiety overlay, but cannot     rule out other etiology such as low grade pancreatitis, abdominal excess,     etc.  He is to be admitted.  Given antiemetics, intravenous fluids.  Try     to advance his diet.  Also, for intravenous antibiotics, oral     decongestants, Antivert p.r.n., and abdominal CT with contrast.  2. Diabetes mellitus.  Will get half insulin and sliding scale insulin.     Hold oral hypoglycemics for now until p.o. intake increases.  3. Hypertension.  Continue medications as is.                                               Corwin Levins, M.D. [facility]    JWJ/MEDQ  D:  07/20/2002  T:  07/20/2002  Job:  [id number]

## 2011-04-04 NOTE — H&P (Signed)
[facility]  Patient:    [name], [name]                      MRN: [id number] Adm. Date:  [id number] Attending:  Harrel Carina CC:         Oley Balm. Georgina Pillion, M.D.                         History and Physical  CHIEF COMPLAINT:  Question of groin pain.  HISTORY OF PRESENT ILLNESS:  Mr. Carswell is a 45 year old male with adult onset insulin-dependent diabetic who was sent over for [place] to see with a tentative diagnosis of incarcerated hernia.  He had been seen by Dr. Georgina Pillion earlier this morning.  Of interest is the fact that he was in the emergency room here on February 07, 2001, four days ago with groin pain and was felt to be developing and abscess.  He was placed on some antibiotics and discharged.  He gives a history of three years ago having an abscess also drained in the same area.  The note then showed a 4 x 4 cm indurated tender area in the right thigh intertriginous area.  He was thought to be having an involving abscess. He was apparently placed on antibiotics, but I cannot find in the chart which antibiotics he was given.  He was given some Vicodin for pain.  Do note that he was started on Keflex.  He got worse pain and was seen by Dr. Georgina Pillion who noticed more tenderness of the groin area and asked [place] to see him.  The patient is otherwise in really good health except for his diabetes.  He does take insulin and antidiabetic medications.  His CBG this morning was 257 in the ED.  He has not had his insulin today.  Other than feeling bad from the pain, he has had no nausea, vomiting or other GI symptoms.  PHYSICAL EXAMINATION:  GENERAL:  The patient is alert and oriented in no distress.  HEENT:  Unremarkable.  LUNGS:  Clear.  ABDOMEN:  Soft and nontender.  He is tender throughout the groin area and there is a fluctuant mass in the intertriginous area where there is what appears to be an old scar from a prior I&D.  There is no hernia and the  testes is normal.  IMPRESSION: 1. 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 2. Hyperglycemia.  PLAN:  Discussed with the patient and went ahead and gave him 1 g of Rocephin IM and 75 mg of Demerol and 25 mg of Phenergan IM.  After waiting approximately 20 minutes the area was prepped with Betadine and anesthetized with 1% Xylocaine and a I&D done.  A fairly large cavity was tracked posteriorly was found, there was some shaggy exudate within.  I could not tell that this was definitely an old cyst.  It was well drained anteriorly and did not seem to communicate back to the rectum, but just went along the inner thigh posteriorly.  The area was irrigated with saline peroxide and some of the exudate debrided.  I had what I thought was adequate drainage and the patient noted much decreased pain.  The wound was packed with a 4 x 4.  He will be placed on Augmentin 875 mg b.i.d. and will follow up with [place] on Monday in the office.  In the meantime, we are going to see if we can get home  health care to make arrangements for wound packing changes over the weekend.  He knows he needs to go back on his insulin and he will monitor his insulin and insulin doses at home which he has been doing. DD:  02/11/01 TD:  02/11/01 Job: [id number]

## 2011-04-04 NOTE — Discharge Summary (Signed)
   NAMEMARSHAL, ESKEW                       ACCOUNT NO.:  [id number]   MEDICAL RECORD NO.:  [id number]                   PATIENT TYPE:  INP   LOCATION:  0379                                 FACILITY:  [facility]   PHYSICIAN:  Georgina Quint. Plotnikov, M.D. [facility]      DATE OF BIRTH:  1966-02-25   DATE OF ADMISSION:  04/19/2003  DATE OF DISCHARGE:  04/21/2003                                 DISCHARGE SUMMARY   DISCHARGE MEDICATIONS:  1. Both Zestril and hydrochlorothiazide.  2. Start Benicar 40/25 one a day.  3. Darvocet-N 100 one four times a day as needed for pain.  4. Resume other home medicines.   ACTIVITY:  As tolerated.   DIET:  Resume previous.   SPECIAL INSTRUCTIONS:  Call if problems.  Follow up with Dr. Everardo All next  week.   FINAL DIAGNOSES:  1. Chest pain, likely musculoskeletal.  Myocardial infarction and pulmonary     edema were ruled out.  CT scan of the lungs was negative.  2. Uncontrolled diabetes.  3. Uncontrolled hypertension.  4. Dyslipidemia.   CONSULTATIONS:  Cardiology - Dr. Dietrich Pates.   HISTORY:  The patient is a 45 year old male who was admitted by Dr. Felicity Coyer  on June 2 with chest pain starting at work.  For the details, please address  her history and physical.   HOSPITAL COURSE:  During the course of hospitalization, his condition has  normalized.  Cardiology saw the patient and a series of CKs and troponins  was obtained.  CT scan was negative for PE but revealed a pulmonary nodule.  PET scan was advised and turned back negative.   LABORATORY DATA:  CT scan of the chest - 1.4 x 1.5 irregular pulmonary  nodule in the right apex.  Otherwise unremarkable.  Leg CT scan negative for  DVT.  EKG was normal sinus rhythm.  White count 7.4, platelets 203, INR 0.8.  Sodium 132, potassium 4.4, glucose 313, TSH 3.39.  Lipid profile with  cholesterol 160, triglycerides 546, HDL 25.  CK 296, MB 8.7.  Hemoglobin A1C  13.3.  Sed rate 28, D-dimer 1.35.                                       Georgina Quint. Plotnikov, M.D. [facility]   AVP/MEDQ  D:  04/21/2003  T:  04/22/2003  Job:  [id number]   cc:   Gregary Signs A. Everardo All, M.D. [facility]

## 2011-04-04 NOTE — Op Note (Signed)
[facility]  Patient:    [name], [name]                    MRN: [id number] Proc. Date: 02/25/01 Adm. Date:  [id number] Attending:  Charlton Haws CC:         Oley Balm. Georgina Pillion, M.D.  Fritzi Mandes, M.D.   Operative Report  Account:  [id number]  Office medical record number:  [id number]  PREOPERATIVE DIAGNOSES:  Right groin abscess involving adductor with secondary lower abscess.  POSTOPERATIVE DIAGNOSES:  Right groin abscess involving adductor with secondary lower abscess.  OPERATION:  Dressing change and debridement of groin abscess with drainage of secondary lower adductor abscess.  SURGEON:  Currie Paris, M.D.  ANESTHESIA:  General endotracheal.  CLINICAL HISTORY:  This patient is a 45 year old adult-onset insulin-dependent diabetic, who had a large groin abscess which is involved with the adductor, and he has been brought to the operating room a couple of times for prior drainages and wound dressing changes.  Yesterday he had a drainage of a secondary abscess which appeared to be separate from the others and a little more inferior in the adductor.  Today he is still draining purulent material out of the aspiration site, and radiology was unable to get a drainage catheter into the cavity.  We elected him to do another dressing change of the upper, already opened areas, and to more widely open this newer area.  DESCRIPTION OF PROCEDURE:  The patient was brought to the operating room and after satisfactory general anesthesia had been obtained, was placed in low lithotomy position.  The old dressings were removed.  There was already one incision right in the groin and a secondary incision at the base a little lower where the infection had tracked down so I had better drainage through-and-through.  I took the old packing and the old Penroses out and irrigated both of these already opened areas.  The lower of the two was  much more clean and really did not have a lot of pus draining out of it.  The upper one also was looking better with more clear-cut viable tissue, although the adductor tendon, which we had been looking at, was now clearly necrotic and falling apart.  Each of the prior times, when I would take him back for dressing changes, there had still been a fair amount of purulent material draining from the upper half of the incision, and today this was really not the case.  I debrided out the portion of the necrotic adductor, irrigated this, put another Penrose both up superiorly and inferiorly along the adductor, and packed it.  I put a Penrose in the second incision and packed it.  I then made a transverse incision parallel to the other two directly over the area that was draining pus and got into a cavity which had some frank pus in it, tried to debride this as much as I could, but there may have been a little bit of fasciitis here which I was unable to excise, could not find any other deeper pockets or any other draining pus.  This was irrigated but since there was no tracking along here, I just packed this with a moistened 4 x 4.  We applied sterile dressings.  The patient tolerated the procedure well.  All counts were correct.  Depending on the wound care, he may need yet one additional trip back to the operating room, but we will do this based  on how the wound appears and his clinical progress. DD:  02/25/01 TD:  02/25/01 Job: [id number]

## 2011-04-04 NOTE — Op Note (Signed)
[facility]  Patient:    [name], [name]                    MRN: [id number] Proc. Date: 02/23/01 Adm. Date:  [id number] Attending:  Charlton Haws                           Operative Report  CCS#:  [phone]  PREOPERATIVE DIAGNOSES:  Large groin abscess involving abductors status post drainage.  POSTOPERATIVE DIAGNOSES:  Large groin abscess involving abductors status post drainage.  OPERATION PERFORMED:  Dressing change and debridement under anesthesia.  SURGEON:  Dr. Jamey Ripa.  ANESTHESIA:  General.  CLINICAL HISTORY:  This patient is a 45 year old with a fairly necrotic area in the right groin extending up and down his abductor which has been drained and he is brought back to the operating room for reevaluation, wound dressing change and possible additional debridement.  DESCRIPTION OF PROCEDURE:  The patient was brought to the operating room and after satisfactory general anesthesia, the patient was placed in lithotomy position with his hip slightly opened so we could access the groin area. It was prepped and draped and the old packing and drains removed. There was on gross necrotic tissue present in the area of the drainage. In going proximally along the adductor tendon, I did encounter another little pocket of blunt dissection that had some purulent material in it. This was broken up manually and at the end the Penrose left up in here after I had irrigated thoroughly with saline. There were two tracks going distal along the adductor, one more anterior and one more posterior. The prior Penrose had been in the more anterior of the two. I didnt really enter any pus pockets down there although the MRI had suggested there was a collection some place in this vicinity in the adductor. I couldnt by palpation feel any area nor see one. I made a second transverse incision at the bottom of this area so I would have a counter incision for through  and through drainage and again carefully palpated through here and couldnt locate any further abscesses but the tract came down along the adductor here. I placed a Penrose down stream from the groin incision into the more anterior of the two tracts and then directly through the second incision into the more posterior of the two tracts. The wound was irrigated with saline copiously and then with a little bit of antibiotic solution and packed loosely with some Kerlix to absorb the drainage but not so tightly as to impede drainage out into the wound.  The patient tolerated the procedure well. The plan will be to repeat a CT of the groin area to see if there is any further residual collection such as was seen on the MRI and if so this may need to be percutaneously drained since its not readily apparent at the time of exploration. Will plan to most likely bring it back again in 48 hours for another dressing change. DD:  02/23/01 TD:  02/23/01 Job: [id number]

## 2011-04-04 NOTE — Discharge Summary (Signed)
[facility]  Patient:    [name], [name]                    MRN: [id number] Adm. Date:  [id number] Disc. Date: 03/02/01 Attending:  Charlton Haws CC:         Fritzi Mandes, M.D.  Oley Balm Georgina Pillion, M.D.   Discharge Summary  ACCOUNT:  [id number]  OFFICE MEDICAL RECORD NUMBER:  [id number]  FINAL DIAGNOSES: 1. Complex right groin abscess involving right adductor. 2. Diabetes. 3. Hypertension. 4. Mildly elevated liver functions.  CLINICAL HISTORY:  Mr. Vore is a 45 year old man who had what appeared initially to be a deep rather confined groin abscess drained in the emergency room and treated with IV antibiotics and local therapy.  He continued to have severe pain, and CT scan done as an outpatient showed that this communicated into the adductor.  He initially grew E. coli and was on Cipro for which this was sensitive.  It was clear that this was not going to resolve without better drainage, and he was admitted for surgical drainage.  HOSPITAL COURSE:  The patient was brought to the operating room on April 6 where his groin abscess was widely opened down into the adductor and debrided. It appeared to involve adductor tendon.  It was copiously irrigated and tracked down the adductor distally as well as proximally up the adductor towards the pelvis.  Penrose drains were left in place.  He tolerated the procedure well and felt a little bit better that evening.  The next day, he was brought back to the operating room for debridement and irrigation again, and things appeared to be somewhat clearing up, although there was still a fair amount of purulence present.  Postoperative to that, he continued to run fevers to 102, and we went ahead and got an MRI scan which showed a secondary abscess in the adductor below where we had already opened.  He was returned to the operating room again, and a second counterincision made below the first at the  bottom of where the infection seemed to track distally and another drain placed here.  Two more Penroses were left again up at the groin site flowing proximally and distally. Follow-up showed that the adductor abscess was still present, and this was the aspirated under CT guidance and was yet another 2 inches distal to the most distal portion of the current drainage site and appeared to be separate and not communicate with the prior drainage site.  The next day, he was feeling a little better, but continued to drain purulent material from where the aspiration had been done, and we felt that it would be appropriate to take him back for another dressing change under anesthesia since he was still quite sensitive.  At this time, I made a third counterincision, this one directly over the area that had been aspirated, got into the collapsed cavity which had a lot of purulent material and necrotic material and drained that.  He tolerated the procedure well and from that point began to improve, becoming gradually afebrile, and gradually having less and less pain.  His wound appeared to be fairly clean and continued to drain around the Penroses, but again was diminishing in quantity, and the wound was beginning to develop granulation tissues.  By April 16, the wounds were fairly clean; one Penrose was still in place at the top incision and one in the middle incision, and the rest were being showered  and packed.  Cultures taken during this admission showed a group B strep complemented by the E. coli on the original admission.  During his stay, his diabetes was managed nicely by Dr. Criss Alvine and remained under control.  Because of the chronic problems, he was seen by ID, and his antibiotic changed from Cipro to Zosyn, and as he improved switched to oral Keflex and Flagyl which was felt that he needed a 10-day course at discharge.  By the time of discharge, he was having very little pain, was up  and ambulating, feeling much better.  His wounds were being showered as noted and appeared fairly clean, and it was felt that he could be discharged on home wound care to be followed in my office in about three to four days for removal of remaining drains.  LABORATORY AND X-RAY DATA:  Laboratory studies included an initial hemoglobin of 12.9 which drifted to 10.2.  White counts initially were 11,000, but just prior to discharge had come down to 8200.  Electrolytes were unremarkable, although his glucoses were elevated, and his sodiums were somewhat low when corrected for the elevated glucose was normal.  His urine was negative.  The pathology on some of the necrotic material showed necrotic skeletal muscle clinically associated with the groin abscess.  X-rays of the bones showed no evidence of osteomyelitis either in the hip or the femur.  Chest x-ray showed no active cardiopulmonary disease.  EKG showed a normal sinus rhythm with some nonspecific ST-T wave abnormalities thought clinically insignificant. DD:  03/02/01 TD:  03/02/01 Job: [id number]

## 2011-04-04 NOTE — Op Note (Signed)
[facility]  Patient:    [name], [name]                    MRN: [id number] Proc. Date: 02/20/01 Adm. Date:  [id number] Attending:  Charlton Haws                           Operative Report  PREOPERATIVE DIAGNOSIS:  Right groin abscess, incompletely drained.  POSTOPERATIVE DIAGNOSIS:  Right groin abscess, incompletely drained.  PROCEDURE:  Drainage of right groin abscess with debridement of adductor canal.  SURGEON:  Currie Paris, M.D.  ANESTHESIA:  General.  CLINICAL HISTORY:  The patient is a 45 year old adult diabetic who has had a groin abscess right at the inguinal canal drained about 10 days ago but is continuing to have severe pain and continues to have drainage from this.  The cavity has gone deeper towards the adductor and it was decided we had inadequate drainage, so he was brought to the operating room for repeat drainage.  DESCRIPTION OF PROCEDURE:  The patient was brought to the operating room and after satisfactory general anesthesia had been obtained was placed in the lithotomy position with his leg just bent out a little bit so we had good exposure to the perineum, groin, and the area where this had been drained. This was drained right in the inguinal canal previously and medial to the vessels.  After the area was prepped and draped, I was able to digitally examine this area and there was still some cavity at a point posteriorly right along the inguinal crease, so the incision was extended along this line.  There was a cavity down toward the adductor, and there was some thickened tissue on top of that that was divided to get better exposure.  The adductor tendon was involved in the area of the infection, and this whole area was cleaned out and debrided.  The cavity seemed to extend down along the adductor both inferiorly and back proximally toward the insertion of the adductor tendon, and this was also irrigated  out but there was no further collection that could be _____, and I was able to visually examine this area and make sure there were no other areas up here that needed to be broken up or purulent collections.  I asked Windy Fast A. Gioffre, M.D., to come in and look to see if he thought we could do any other debridement or anything else differently, and he felt that we had adequate drainage at this point.  He did recommend that we get some femur films to complement the hip films we had gotten to be sure there was no osteomyelitis, and we will try to do that later.  The wound after irrigation was then drained with a Penrose going up toward the pelvis and another one-inch Penrose going down toward down the leg a little bit to make sure these cavities stayed open, and then I packed the remaining opened area with some antibiotic-soaked Kerlix.  At this point, I will plan to bring him back for another drainage, debridement, irrigation tomorrow, and then make further plans about longterm management of this wound. DD:  02/20/01 TD:  02/20/01 Job: [id number]

## 2011-04-15 ENCOUNTER — Encounter: Payer: Self-pay | Admitting: Orthopedic Surgery

## 2011-04-17 ENCOUNTER — Inpatient Hospital Stay (HOSPITAL_COMMUNITY)
Admission: EM | Admit: 2011-04-17 | Discharge: 2011-05-01 | DRG: 004 | Disposition: A | Discharge status: Skilled Nursing Facility | Payer: Medicare (Managed Care) | Attending: Emergency Medicine | Admitting: Emergency Medicine

## 2011-04-17 ENCOUNTER — Inpatient Hospital Stay (HOSPITAL_COMMUNITY): Payer: Medicare (Managed Care)

## 2011-04-17 DIAGNOSIS — R5381 Other malaise: Secondary | ICD-10-CM | POA: Diagnosis not present

## 2011-04-17 DIAGNOSIS — I739 Peripheral vascular disease, unspecified: Secondary | ICD-10-CM | POA: Diagnosis present

## 2011-04-17 DIAGNOSIS — Y833 Surgical operation with formation of external stoma as the cause of abnormal reaction of the patient, or of later complication, without mention of misadventure at the time of the procedure: Secondary | ICD-10-CM | POA: Diagnosis not present

## 2011-04-17 DIAGNOSIS — I129 Hypertensive chronic kidney disease with stage 1 through stage 4 chronic kidney disease, or unspecified chronic kidney disease: Secondary | ICD-10-CM | POA: Diagnosis present

## 2011-04-17 DIAGNOSIS — IMO0002 Reserved for concepts with insufficient information to code with codable children: Secondary | ICD-10-CM | POA: Diagnosis not present

## 2011-04-17 DIAGNOSIS — J9600 Acute respiratory failure, unspecified whether with hypoxia or hypercapnia: Secondary | ICD-10-CM | POA: Diagnosis not present

## 2011-04-17 DIAGNOSIS — I469 Cardiac arrest, cause unspecified: Secondary | ICD-10-CM | POA: Diagnosis not present

## 2011-04-17 DIAGNOSIS — M109 Gout, unspecified: Secondary | ICD-10-CM | POA: Diagnosis present

## 2011-04-17 DIAGNOSIS — D72829 Elevated white blood cell count, unspecified: Secondary | ICD-10-CM | POA: Diagnosis not present

## 2011-04-17 DIAGNOSIS — I959 Hypotension, unspecified: Secondary | ICD-10-CM | POA: Diagnosis not present

## 2011-04-17 DIAGNOSIS — N183 Chronic kidney disease, stage 3 unspecified: Secondary | ICD-10-CM | POA: Diagnosis present

## 2011-04-17 DIAGNOSIS — A4902 Methicillin resistant Staphylococcus aureus infection, unspecified site: Secondary | ICD-10-CM | POA: Diagnosis not present

## 2011-04-17 DIAGNOSIS — I509 Heart failure, unspecified: Secondary | ICD-10-CM | POA: Diagnosis present

## 2011-04-17 DIAGNOSIS — D649 Anemia, unspecified: Secondary | ICD-10-CM | POA: Diagnosis not present

## 2011-04-17 DIAGNOSIS — J988 Other specified respiratory disorders: Secondary | ICD-10-CM | POA: Diagnosis present

## 2011-04-17 DIAGNOSIS — S88119A Complete traumatic amputation at level between knee and ankle, unspecified lower leg, initial encounter: Secondary | ICD-10-CM

## 2011-04-17 DIAGNOSIS — R1310 Dysphagia, unspecified: Secondary | ICD-10-CM | POA: Diagnosis present

## 2011-04-17 DIAGNOSIS — E662 Morbid (severe) obesity with alveolar hypoventilation: Secondary | ICD-10-CM | POA: Diagnosis present

## 2011-04-17 DIAGNOSIS — Z91010 Allergy to peanuts: Secondary | ICD-10-CM

## 2011-04-17 DIAGNOSIS — J9509 Other tracheostomy complication: Secondary | ICD-10-CM | POA: Diagnosis not present

## 2011-04-17 DIAGNOSIS — G4733 Obstructive sleep apnea (adult) (pediatric): Secondary | ICD-10-CM | POA: Diagnosis present

## 2011-04-17 DIAGNOSIS — E871 Hypo-osmolality and hyponatremia: Secondary | ICD-10-CM | POA: Diagnosis not present

## 2011-04-17 DIAGNOSIS — I5022 Chronic systolic (congestive) heart failure: Secondary | ICD-10-CM | POA: Diagnosis present

## 2011-04-17 DIAGNOSIS — F329 Major depressive disorder, single episode, unspecified: Secondary | ICD-10-CM | POA: Diagnosis not present

## 2011-04-17 DIAGNOSIS — J9502 Infection of tracheostomy stoma: Secondary | ICD-10-CM | POA: Diagnosis not present

## 2011-04-17 DIAGNOSIS — Z9861 Coronary angioplasty status: Secondary | ICD-10-CM

## 2011-04-17 DIAGNOSIS — I2510 Atherosclerotic heart disease of native coronary artery without angina pectoris: Secondary | ICD-10-CM | POA: Diagnosis present

## 2011-04-17 DIAGNOSIS — N179 Acute kidney failure, unspecified: Secondary | ICD-10-CM | POA: Diagnosis not present

## 2011-04-17 DIAGNOSIS — J40 Bronchitis, not specified as acute or chronic: Secondary | ICD-10-CM | POA: Diagnosis not present

## 2011-04-17 DIAGNOSIS — F3289 Other specified depressive episodes: Secondary | ICD-10-CM | POA: Diagnosis not present

## 2011-04-17 DIAGNOSIS — T7801XA Anaphylactic reaction due to peanuts, initial encounter: Principal | ICD-10-CM | POA: Diagnosis present

## 2011-04-17 LAB — CBC
Hemoglobin: 14.9 g/dL (ref 13.0–17.0)
MCH: 27 pg (ref 26.0–34.0)
Platelets: 239 10*3/uL (ref 150–400)
RBC: 5.52 MIL/uL (ref 4.22–5.81)
WBC: 4.3 10*3/uL (ref 4.0–10.5)

## 2011-04-17 LAB — DIFFERENTIAL
Basophils Absolute: 0 10*3/uL (ref 0.0–0.1)
Basophils Relative: 0 % (ref 0–1)
Eosinophils Absolute: 0.1 10*3/uL (ref 0.0–0.7)
Neutro Abs: 2 10*3/uL (ref 1.7–7.7)
Neutrophils Relative %: 46 % (ref 43–77)

## 2011-04-17 LAB — COMPREHENSIVE METABOLIC PANEL
AST: 17 U/L (ref 0–37)
CO2: 20 mEq/L (ref 19–32)
Calcium: 8.3 mg/dL — ABNORMAL LOW (ref 8.4–10.5)
Creatinine, Ser: 1.74 mg/dL — ABNORMAL HIGH (ref 0.4–1.5)
GFR calc Af Amer: 52 mL/min — ABNORMAL LOW (ref >60)
GFR calc non Af Amer: 43 mL/min — ABNORMAL LOW (ref >60)
Sodium: 131 mEq/L — ABNORMAL LOW (ref 135–145)
Total Protein: 8.2 g/dL (ref 6.0–8.3)

## 2011-04-17 LAB — MRSA PCR SCREENING: MRSA by PCR: POSITIVE — AB

## 2011-04-17 LAB — PROTIME-INR
INR: 1.13 (ref 0.00–1.49)
Prothrombin Time: 14.7 seconds (ref 11.6–15.2)

## 2011-04-17 LAB — GLUCOSE, CAPILLARY: Glucose-Capillary: 102 mg/dL — ABNORMAL HIGH (ref 70–99)

## 2011-04-17 LAB — HEMOGLOBIN A1C: Mean Plasma Glucose: 189 mg/dL — ABNORMAL HIGH (ref <117)

## 2011-04-17 LAB — APTT: aPTT: 26 seconds (ref 24–37)

## 2011-04-18 ENCOUNTER — Encounter: Payer: Self-pay | Admitting: Orthopedic Surgery

## 2011-04-18 LAB — CBC
Hemoglobin: 13.5 g/dL (ref 13.0–17.0)
MCH: 27.1 pg (ref 26.0–34.0)
MCHC: 32.8 g/dL (ref 30.0–36.0)
MCV: 82.7 fL (ref 78.0–100.0)
RBC: 4.98 MIL/uL (ref 4.22–5.81)

## 2011-04-18 LAB — BASIC METABOLIC PANEL
BUN: 50 mg/dL — ABNORMAL HIGH (ref 6–23)
CO2: 23 mEq/L (ref 19–32)
Calcium: 8.4 mg/dL (ref 8.4–10.5)
Chloride: 97 mEq/L (ref 96–112)
Creatinine, Ser: 1.63 mg/dL — ABNORMAL HIGH (ref 0.4–1.5)

## 2011-04-18 LAB — GLUCOSE, CAPILLARY
Glucose-Capillary: 183 mg/dL — ABNORMAL HIGH (ref 70–99)
Glucose-Capillary: 278 mg/dL — ABNORMAL HIGH (ref 70–99)

## 2011-04-19 ENCOUNTER — Inpatient Hospital Stay (HOSPITAL_COMMUNITY): Payer: Medicare (Managed Care)

## 2011-04-19 DIAGNOSIS — I469 Cardiac arrest, cause unspecified: Secondary | ICD-10-CM

## 2011-04-19 DIAGNOSIS — J9600 Acute respiratory failure, unspecified whether with hypoxia or hypercapnia: Secondary | ICD-10-CM

## 2011-04-19 DIAGNOSIS — Z930 Tracheostomy status: Secondary | ICD-10-CM

## 2011-04-19 LAB — BLOOD GAS, ARTERIAL
Drawn by: 232811
O2 Saturation: 99.2 %
PEEP: 5 cmH2O
RATE: 20 resp/min
pCO2 arterial: 47.3 mmHg — ABNORMAL HIGH (ref 35.0–45.0)
pH, Arterial: 7.322 — ABNORMAL LOW (ref 7.350–7.450)
pO2, Arterial: 168 mmHg — ABNORMAL HIGH (ref 80.0–100.0)

## 2011-04-19 LAB — COMPREHENSIVE METABOLIC PANEL
BUN: 53 mg/dL — ABNORMAL HIGH (ref 6–23)
Calcium: 8.3 mg/dL — ABNORMAL LOW (ref 8.4–10.5)
Glucose, Bld: 365 mg/dL — ABNORMAL HIGH (ref 70–99)
Total Protein: 8.4 g/dL — ABNORMAL HIGH (ref 6.0–8.3)

## 2011-04-19 LAB — CARDIAC PANEL(CRET KIN+CKTOT+MB+TROPI)
CK, MB: 5.6 ng/mL — ABNORMAL HIGH (ref 0.3–4.0)
Total CK: 500 U/L — ABNORMAL HIGH (ref 7–232)

## 2011-04-19 LAB — GLUCOSE, CAPILLARY
Glucose-Capillary: 358 mg/dL — ABNORMAL HIGH (ref 70–99)
Glucose-Capillary: 342 mg/dL — ABNORMAL HIGH (ref 70–99)
Glucose-Capillary: 375 mg/dL — ABNORMAL HIGH (ref 70–99)
Glucose-Capillary: 295 mg/dL — ABNORMAL HIGH (ref 70–99)
Glucose-Capillary: 300 mg/dL — ABNORMAL HIGH (ref 70–99)
Glucose-Capillary: 314 mg/dL — ABNORMAL HIGH (ref 70–99)

## 2011-04-19 LAB — CBC
HCT: 38.8 % — ABNORMAL LOW (ref 39.0–52.0)
Platelets: 222 10*3/uL (ref 150–400)
RDW: 15.9 % — ABNORMAL HIGH (ref 11.5–15.5)
WBC: 14 10*3/uL — ABNORMAL HIGH (ref 4.0–10.5)

## 2011-04-19 LAB — DIFFERENTIAL
Basophils Absolute: 0 10*3/uL (ref 0.0–0.1)
Eosinophils Absolute: 0.2 10*3/uL (ref 0.0–0.7)
Eosinophils Relative: 1 % (ref 0–5)
Lymphocytes Relative: 9 % — ABNORMAL LOW (ref 12–46)

## 2011-04-19 LAB — BASIC METABOLIC PANEL
GFR calc Af Amer: >60 mL/min (ref >60)
GFR calc non Af Amer: 51 mL/min — ABNORMAL LOW (ref >60)
Potassium: 4.5 mEq/L (ref 3.5–5.1)
Sodium: 134 mEq/L — ABNORMAL LOW (ref 135–145)

## 2011-04-20 ENCOUNTER — Inpatient Hospital Stay (HOSPITAL_COMMUNITY): Payer: Medicare (Managed Care)

## 2011-04-20 DIAGNOSIS — I319 Disease of pericardium, unspecified: Secondary | ICD-10-CM

## 2011-04-20 LAB — BASIC METABOLIC PANEL
CO2: 24 mEq/L (ref 19–32)
Chloride: 99 mEq/L (ref 96–112)
GFR calc Af Amer: 48 mL/min — ABNORMAL LOW (ref >60)
Glucose, Bld: 264 mg/dL — ABNORMAL HIGH (ref 70–99)
Potassium: 4.1 mEq/L (ref 3.5–5.1)
Sodium: 134 mEq/L — ABNORMAL LOW (ref 135–145)

## 2011-04-20 LAB — GLUCOSE, CAPILLARY
Glucose-Capillary: 243 mg/dL — ABNORMAL HIGH (ref 70–99)
Glucose-Capillary: 208 mg/dL — ABNORMAL HIGH (ref 70–99)

## 2011-04-20 LAB — BLOOD GAS, ARTERIAL
Bicarbonate: 24 mEq/L (ref 20.0–24.0)
Drawn by: 232811
FIO2: 0.3 %
O2 Saturation: 96.3 %
PEEP: 5 cmH2O
RATE: 20 resp/min
pCO2 arterial: 38.2 mmHg (ref 35.0–45.0)
pO2, Arterial: 80.4 mmHg (ref 80.0–100.0)

## 2011-04-20 LAB — PHOSPHORUS: Phosphorus: 3 mg/dL (ref 2.3–4.6)

## 2011-04-20 LAB — CBC
HCT: 37.7 % — ABNORMAL LOW (ref 39.0–52.0)
Hemoglobin: 11.9 g/dL — ABNORMAL LOW (ref 13.0–17.0)
RBC: 4.55 MIL/uL (ref 4.22–5.81)

## 2011-04-20 LAB — PRO B NATRIURETIC PEPTIDE: Pro B Natriuretic peptide (BNP): 9127 pg/mL — ABNORMAL HIGH (ref 0–125)

## 2011-04-21 ENCOUNTER — Inpatient Hospital Stay (HOSPITAL_COMMUNITY): Payer: Medicare (Managed Care)

## 2011-04-21 LAB — BASIC METABOLIC PANEL
BUN: 65 mg/dL — ABNORMAL HIGH (ref 6–23)
CO2: 25 mEq/L (ref 19–32)
Chloride: 102 mEq/L (ref 96–112)
Potassium: 4.2 mEq/L (ref 3.5–5.1)

## 2011-04-21 LAB — GLUCOSE, CAPILLARY
Glucose-Capillary: 222 mg/dL — ABNORMAL HIGH (ref 70–99)
Glucose-Capillary: 147 mg/dL — ABNORMAL HIGH (ref 70–99)

## 2011-04-21 LAB — DIFFERENTIAL
Eosinophils Absolute: 0.2 10*3/uL (ref 0.0–0.7)
Eosinophils Relative: 2 % (ref 0–5)
Lymphs Abs: 2.2 10*3/uL (ref 0.7–4.0)
Monocytes Absolute: 1.8 10*3/uL — ABNORMAL HIGH (ref 0.1–1.0)
Monocytes Relative: 14 % — ABNORMAL HIGH (ref 3–12)
Neutrophils Relative %: 68 % (ref 43–77)

## 2011-04-21 LAB — CBC
MCH: 26.7 pg (ref 26.0–34.0)
MCHC: 31.4 g/dL (ref 30.0–36.0)
MCV: 85.1 fL (ref 78.0–100.0)
Platelets: 203 10*3/uL (ref 150–400)
RBC: 4.42 MIL/uL (ref 4.22–5.81)

## 2011-04-21 LAB — PROCALCITONIN: Procalcitonin: 5.54 ng/mL

## 2011-04-21 LAB — MAGNESIUM: Magnesium: 2.2 mg/dL (ref 1.5–2.5)

## 2011-04-21 LAB — PHOSPHORUS: Phosphorus: 4.2 mg/dL (ref 2.3–4.6)

## 2011-04-21 LAB — LACTIC ACID, PLASMA: Lactic Acid, Venous: 2 mmol/L (ref 0.5–2.2)

## 2011-04-22 ENCOUNTER — Inpatient Hospital Stay (HOSPITAL_COMMUNITY): Payer: Medicare (Managed Care)

## 2011-04-22 LAB — CBC
HCT: 37.5 % — ABNORMAL LOW (ref 39.0–52.0)
Hemoglobin: 11.7 g/dL — ABNORMAL LOW (ref 13.0–17.0)
MCH: 26.6 pg (ref 26.0–34.0)
MCHC: 31.2 g/dL (ref 30.0–36.0)

## 2011-04-22 LAB — GLUCOSE, CAPILLARY
Glucose-Capillary: 174 mg/dL — ABNORMAL HIGH (ref 70–99)
Glucose-Capillary: 181 mg/dL — ABNORMAL HIGH (ref 70–99)

## 2011-04-22 LAB — BASIC METABOLIC PANEL
CO2: 24 mEq/L (ref 19–32)
Calcium: 9 mg/dL (ref 8.4–10.5)
Creatinine, Ser: 1.96 mg/dL — ABNORMAL HIGH (ref 0.4–1.5)
Glucose, Bld: 167 mg/dL — ABNORMAL HIGH (ref 70–99)

## 2011-04-22 LAB — URINE CULTURE

## 2011-04-23 ENCOUNTER — Inpatient Hospital Stay (HOSPITAL_COMMUNITY): Payer: Medicare (Managed Care)

## 2011-04-23 LAB — BASIC METABOLIC PANEL
BUN: 68 mg/dL — ABNORMAL HIGH (ref 6–23)
Calcium: 8.9 mg/dL (ref 8.4–10.5)
Creatinine, Ser: 2.13 mg/dL — ABNORMAL HIGH (ref 0.4–1.5)
GFR calc non Af Amer: 34 mL/min — ABNORMAL LOW (ref >60)
Glucose, Bld: 97 mg/dL (ref 70–99)
Sodium: 140 mEq/L (ref 135–145)

## 2011-04-23 LAB — GLUCOSE, CAPILLARY
Glucose-Capillary: 156 mg/dL — ABNORMAL HIGH (ref 70–99)
Glucose-Capillary: 142 mg/dL — ABNORMAL HIGH (ref 70–99)

## 2011-04-23 LAB — CULTURE, RESPIRATORY W GRAM STAIN

## 2011-04-24 ENCOUNTER — Inpatient Hospital Stay (HOSPITAL_COMMUNITY): Payer: Medicare (Managed Care)

## 2011-04-24 LAB — COMPREHENSIVE METABOLIC PANEL
ALT: 22 U/L (ref 0–53)
AST: 37 U/L (ref 0–37)
Albumin: 2.3 g/dL — ABNORMAL LOW (ref 3.5–5.2)
CO2: 23 mEq/L (ref 19–32)
Chloride: 105 mEq/L (ref 96–112)
Creatinine, Ser: 2.1 mg/dL — ABNORMAL HIGH (ref 0.4–1.5)
GFR calc Af Amer: 42 mL/min — ABNORMAL LOW (ref >60)
GFR calc non Af Amer: 34 mL/min — ABNORMAL LOW (ref >60)
Potassium: 4.1 mEq/L (ref 3.5–5.1)
Sodium: 137 mEq/L (ref 135–145)
Total Bilirubin: 0.7 mg/dL (ref 0.3–1.2)

## 2011-04-24 LAB — VANCOMYCIN, TROUGH: Vancomycin Tr: 21.2 ug/mL — ABNORMAL HIGH (ref 10.0–20.0)

## 2011-04-24 LAB — GLUCOSE, CAPILLARY: Glucose-Capillary: 192 mg/dL — ABNORMAL HIGH (ref 70–99)

## 2011-04-24 LAB — CBC
Hemoglobin: 10.3 g/dL — ABNORMAL LOW (ref 13.0–17.0)
MCH: 26.7 pg (ref 26.0–34.0)
Platelets: 236 10*3/uL (ref 150–400)
RBC: 3.86 MIL/uL — ABNORMAL LOW (ref 4.22–5.81)
WBC: 11.9 10*3/uL — ABNORMAL HIGH (ref 4.0–10.5)

## 2011-04-25 LAB — BASIC METABOLIC PANEL
CO2: 22 mEq/L (ref 19–32)
Calcium: 8.1 mg/dL — ABNORMAL LOW (ref 8.4–10.5)
Creatinine, Ser: 2.15 mg/dL — ABNORMAL HIGH (ref 0.4–1.5)
GFR calc Af Amer: 40 mL/min — ABNORMAL LOW (ref >60)
GFR calc non Af Amer: 33 mL/min — ABNORMAL LOW (ref >60)
Glucose, Bld: 124 mg/dL — ABNORMAL HIGH (ref 70–99)
Sodium: 132 mEq/L — ABNORMAL LOW (ref 135–145)

## 2011-04-25 LAB — GLUCOSE, CAPILLARY: Glucose-Capillary: 156 mg/dL — ABNORMAL HIGH (ref 70–99)

## 2011-04-26 DIAGNOSIS — G4710 Hypersomnia, unspecified: Secondary | ICD-10-CM

## 2011-04-26 DIAGNOSIS — G4730 Sleep apnea, unspecified: Secondary | ICD-10-CM

## 2011-04-26 DIAGNOSIS — G4736 Sleep related hypoventilation in conditions classified elsewhere: Secondary | ICD-10-CM

## 2011-04-26 LAB — BASIC METABOLIC PANEL
CO2: 22 mEq/L (ref 19–32)
Chloride: 105 mEq/L (ref 96–112)
Glucose, Bld: 111 mg/dL — ABNORMAL HIGH (ref 70–99)
Sodium: 134 mEq/L — ABNORMAL LOW (ref 135–145)

## 2011-04-26 LAB — GLUCOSE, CAPILLARY
Glucose-Capillary: 91 mg/dL (ref 70–99)
Glucose-Capillary: 122 mg/dL — ABNORMAL HIGH (ref 70–99)

## 2011-04-27 LAB — CULTURE, BLOOD (ROUTINE X 2)
Culture  Setup Time: 201206040842
Culture  Setup Time: 201206040842
Culture: NO GROWTH

## 2011-04-27 LAB — GLUCOSE, CAPILLARY: Glucose-Capillary: 99 mg/dL (ref 70–99)

## 2011-04-28 ENCOUNTER — Inpatient Hospital Stay (HOSPITAL_COMMUNITY): Payer: Medicare (Managed Care)

## 2011-04-28 DIAGNOSIS — J9620 Acute and chronic respiratory failure, unspecified whether with hypoxia or hypercapnia: Secondary | ICD-10-CM

## 2011-04-28 LAB — BASIC METABOLIC PANEL
BUN: 44 mg/dL — ABNORMAL HIGH (ref 6–23)
Creatinine, Ser: 1.55 mg/dL — ABNORMAL HIGH (ref 0.4–1.5)
GFR calc non Af Amer: 49 mL/min — ABNORMAL LOW (ref >60)
Glucose, Bld: 89 mg/dL (ref 70–99)
Potassium: 4 mEq/L (ref 3.5–5.1)

## 2011-04-28 LAB — CBC
HCT: 35 % — ABNORMAL LOW (ref 39.0–52.0)
Hemoglobin: 10.9 g/dL — ABNORMAL LOW (ref 13.0–17.0)
MCH: 26.8 pg (ref 26.0–34.0)
MCHC: 31.1 g/dL (ref 30.0–36.0)
MCV: 86.2 fL (ref 78.0–100.0)

## 2011-04-28 LAB — GLUCOSE, CAPILLARY
Glucose-Capillary: 132 mg/dL — ABNORMAL HIGH (ref 70–99)
Glucose-Capillary: 137 mg/dL — ABNORMAL HIGH (ref 70–99)

## 2011-04-29 LAB — GLUCOSE, CAPILLARY
Glucose-Capillary: 75 mg/dL (ref 70–99)
Glucose-Capillary: 171 mg/dL — ABNORMAL HIGH (ref 70–99)

## 2011-04-29 LAB — BASIC METABOLIC PANEL
CO2: 22 mEq/L (ref 19–32)
Chloride: 104 mEq/L (ref 96–112)
Potassium: 4 mEq/L (ref 3.5–5.1)
Sodium: 133 mEq/L — ABNORMAL LOW (ref 135–145)

## 2011-04-30 LAB — GLUCOSE, CAPILLARY
Glucose-Capillary: 131 mg/dL — ABNORMAL HIGH (ref 70–99)
Glucose-Capillary: 101 mg/dL — ABNORMAL HIGH (ref 70–99)
Glucose-Capillary: 134 mg/dL — ABNORMAL HIGH (ref 70–99)

## 2011-04-30 NOTE — H&P (Signed)
  [name], [name]             ACCOUNT NO.:  [id number]  MEDICAL RECORD NO.:  [id number]           PATIENT TYPE:  I  LOCATION:  1226                         FACILITY:  [facility]  PHYSICIAN:  Takumi Din H. Pollyann Kennedy, MD     DATE OF BIRTH:  [birth year]  DATE OF ADMISSION:  04/17/2011 DATE OF DISCHARGE:                             HISTORY & PHYSICAL   REASON FOR ADMISSION:  Acute swelling of the tongue and airway obstruction.  HISTORY:  This is a 45 year old gentleman who was eating something with nuts in it about 1:30 and started developing swelling of his tongue acutely.  He took a Benadryl which did not really help.  He started developing some airway distress and came to emergency department.  He was seen by the ER staff and then by Anesthesia and then by myself.  PAST MEDICAL HISTORY:  Significant for, 1. Obesity. 2. Sleep apnea, on CPAP. 3. Coronary artery disease, status post 3 MIs, 2 cardiac stents     placed. 4. Diabetes. 5. 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  Medicines were listed in the intake form, but was incomplete as he did not have a list with him and he was unable to speak to [place].  As far as we know, he is not on an ACE inhibitor.  He has never had an allergic reaction like this before.  He has eaten nuts on multiple occasions, never had trouble before.  PHYSICAL EXAMINATION:  GENERAL:  He is an obese gentleman, in obvious respiratory distress.  He is moving air, but is having very difficult time with secretions and has to sit up and lean forward to be less uncomfortable.  There is no actual stridor. NECK:  Very short and fat. HEENT:  His oral tongue is severely swollen and tense.  Extends into the anterior floor of mouth.  The pharynx does not appear to be involved as much and unable to see, but the exam is very limited because of the size of the tongue.  There are no palpable neck masses.  IMPRESSION:  Impending airway obstruction secondary  to severe tongue swelling and secondary to unknown cause.  We discussed different options, but my recommendation is for emergency tracheostomy in the operating room.  The patient and his adopted father agree and I talked to his wife on the phone as well in the office who understand and agreed to this procedure.  He will be brought up immediately to the operating room for surgery.     Demitrios Molyneux H. Pollyann Kennedy, MD     JHR/MEDQ  D:  04/17/2011  T:  04/18/2011  Job:  [id number]  Electronically Signed by Serena Colonel MD on 04/30/2011 11:02:41 AM

## 2011-04-30 NOTE — Op Note (Signed)
  [name], [name]             ACCOUNT NO.:  [id number]  MEDICAL RECORD NO.:  [id number]           PATIENT TYPE:  I  LOCATION:  1226                         FACILITY:  [facility]  PHYSICIAN:  Almee Pelphrey H. Pollyann Kennedy, MD     DATE OF BIRTH:  17-Sep-1966  DATE OF PROCEDURE:  04/17/2011 DATE OF DISCHARGE:                              OPERATIVE REPORT   PREOPERATIVE DIAGNOSES:  Airway obstruction and anaphylactic reaction, unknown etiology.  POSTOPERATIVE DIAGNOSES:  Airway obstruction and anaphylactic reaction, unknown etiology.  PROCEDURE:  Emergency tracheostomy.  SURGEON:  Lilyanna Lunt H. Pollyann Kennedy, MD  ANESTHESIA:  Local anesthesia was used with monitored anesthesia care.  COMPLICATIONS:  No complications.  BLOOD LOSS:  Minimal.  FINDINGS:  Normal neck structures.  There was severe tense edema of the oral tongue and anterior floor of mouth.  There was fullness of the posterior pharyngeal wall, but it may just be redundant soft tissue without any obvious inflammation.  HISTORY:  45 year old who was admitted emergently through the emergency department with acute onset of tongue swelling, difficulty breathing. Risks, benefits, alternatives, and complications of procedure were explained to the patient, his adopted father and his wife over the telephone.  All seemed to understand and agreed to surgery.  PROCEDURE:  The patient was taken to the operating, placed on the operating table in the semi-supine position.  The anterior neck was extended prepped with Betadine solution.  A vertical midline incision just above the sternal notch was created with electrocautery unit. Dissection through the subcutaneous fat and superficial fascia revealed the strap muscles.  The strap muscles were divided at the midline and reflected laterally.  The upper thyroid isthmus was divided using electrocautery.  The upper trachea was identified and a vertical tracheostomy was created through the 1st and 2nd ring.  I  was unable to reach any below that as the clavicles were in the way.  I was able to open the trachea vertically and inserted an #8 cuffed Shiley trach tube without difficulty.  This was cured in place with Velcro straps and nylon suture.  He was then allowed to ventilate spontaneously and anesthetic gases were administered.  There was good CO2 return and good passage of air with good oxygenation.  The patient was then transferred to intensive care unit in stable condition.     Richard Hollister H. Pollyann Kennedy, MD     JHR/MEDQ  D:  04/17/2011  T:  04/18/2011  Job:  [id number]  Electronically Signed by Serena Colonel MD on 04/30/2011 11:02:43 AM

## 2011-05-01 ENCOUNTER — Inpatient Hospital Stay
Admission: AD | Admit: 2011-05-01 | Discharge: 2011-05-29 | Disposition: A | Discharge status: Home / Self Care | Payer: Self-pay | Source: Ambulatory Visit | Attending: Internal Medicine | Admitting: Internal Medicine

## 2011-05-01 DIAGNOSIS — E669 Obesity, unspecified: Secondary | ICD-10-CM

## 2011-05-02 ENCOUNTER — Other Ambulatory Visit (HOSPITAL_COMMUNITY): Payer: Medicare (Managed Care) | Attending: Internal Medicine

## 2011-05-02 LAB — CBC
HCT: 36.7 % — ABNORMAL LOW (ref 39.0–52.0)
Hemoglobin: 11.4 g/dL — ABNORMAL LOW (ref 13.0–17.0)
MCH: 26.7 pg (ref 26.0–34.0)
MCHC: 31.1 g/dL (ref 30.0–36.0)
RBC: 4.27 MIL/uL (ref 4.22–5.81)

## 2011-05-02 LAB — BASIC METABOLIC PANEL
BUN: 27 mg/dL — ABNORMAL HIGH (ref 6–23)
CO2: 22 mEq/L (ref 19–32)
Calcium: 8.6 mg/dL (ref 8.4–10.5)
GFR calc non Af Amer: 59 mL/min — ABNORMAL LOW (ref >60)
Glucose, Bld: 112 mg/dL — ABNORMAL HIGH (ref 70–99)
Potassium: 4.4 mEq/L (ref 3.5–5.1)

## 2011-05-02 LAB — GLUCOSE, CAPILLARY: Glucose-Capillary: 138 mg/dL — ABNORMAL HIGH (ref 70–99)

## 2011-05-05 LAB — BASIC METABOLIC PANEL
CO2: 24 mEq/L (ref 19–32)
Calcium: 8.4 mg/dL (ref 8.4–10.5)
Chloride: 108 mEq/L (ref 96–112)
Glucose, Bld: 129 mg/dL — ABNORMAL HIGH (ref 70–99)
Sodium: 138 mEq/L (ref 135–145)

## 2011-05-05 LAB — CBC
Hemoglobin: 9.4 g/dL — ABNORMAL LOW (ref 13.0–17.0)
MCH: 27 pg (ref 26.0–34.0)
Platelets: 203 10*3/uL (ref 150–400)
RBC: 3.48 MIL/uL — ABNORMAL LOW (ref 4.22–5.81)
WBC: 6.5 10*3/uL (ref 4.0–10.5)

## 2011-05-06 LAB — BASIC METABOLIC PANEL
BUN: 41 mg/dL — ABNORMAL HIGH (ref 6–23)
CO2: 23 mEq/L (ref 19–32)
Chloride: 105 mEq/L (ref 96–112)
GFR calc non Af Amer: 35 mL/min — ABNORMAL LOW (ref >60)
Glucose, Bld: 173 mg/dL — ABNORMAL HIGH (ref 70–99)
Potassium: 4.3 mEq/L (ref 3.5–5.1)
Sodium: 135 mEq/L (ref 135–145)

## 2011-05-07 LAB — BASIC METABOLIC PANEL
BUN: 41 mg/dL — ABNORMAL HIGH (ref 6–23)
CO2: 24 mEq/L (ref 19–32)
Chloride: 106 mEq/L (ref 96–112)
Creatinine, Ser: 1.84 mg/dL — ABNORMAL HIGH (ref 0.50–1.35)
GFR calc Af Amer: 48 mL/min — ABNORMAL LOW (ref >60)
Glucose, Bld: 110 mg/dL — ABNORMAL HIGH (ref 70–99)
Potassium: 4.4 mEq/L (ref 3.5–5.1)

## 2011-05-09 LAB — BASIC METABOLIC PANEL
GFR calc Af Amer: >60 mL/min (ref >60)
GFR calc non Af Amer: 54 mL/min — ABNORMAL LOW (ref >60)
Potassium: 4.3 mEq/L (ref 3.5–5.1)
Sodium: 137 mEq/L (ref 135–145)

## 2011-05-10 ENCOUNTER — Other Ambulatory Visit (HOSPITAL_COMMUNITY): Payer: Medicare (Managed Care) | Attending: Internal Medicine

## 2011-05-11 LAB — BASIC METABOLIC PANEL
GFR calc Af Amer: >60 mL/min (ref >60)
GFR calc non Af Amer: 52 mL/min — ABNORMAL LOW (ref >60)
Potassium: 4.1 mEq/L (ref 3.5–5.1)
Sodium: 137 mEq/L (ref 135–145)

## 2011-05-13 LAB — BASIC METABOLIC PANEL
BUN: 37 mg/dL — ABNORMAL HIGH (ref 6–23)
GFR calc Af Amer: >60 mL/min (ref >60)
GFR calc non Af Amer: 53 mL/min — ABNORMAL LOW (ref >60)
Potassium: 4.5 mEq/L (ref 3.5–5.1)
Sodium: 139 mEq/L (ref 135–145)

## 2011-05-15 LAB — RENAL FUNCTION PANEL
Albumin: 2.8 g/dL — ABNORMAL LOW (ref 3.5–5.2)
Chloride: 100 mEq/L (ref 96–112)
Creatinine, Ser: 1.42 mg/dL — ABNORMAL HIGH (ref 0.50–1.35)
GFR calc non Af Amer: 54 mL/min — ABNORMAL LOW (ref >60)
Potassium: 3.8 mEq/L (ref 3.5–5.1)

## 2011-05-16 LAB — CBC
Hemoglobin: 9.8 g/dL — ABNORMAL LOW (ref 13.0–17.0)
MCHC: 31.5 g/dL (ref 30.0–36.0)
Platelets: 242 10*3/uL (ref 150–400)
RDW: 16.6 % — ABNORMAL HIGH (ref 11.5–15.5)

## 2011-05-16 LAB — DIFFERENTIAL
Basophils Absolute: 0 10*3/uL (ref 0.0–0.1)
Basophils Relative: 1 % (ref 0–1)
Eosinophils Absolute: 0.5 10*3/uL (ref 0.0–0.7)
Monocytes Absolute: 0.7 10*3/uL (ref 0.1–1.0)
Neutro Abs: 4.1 10*3/uL (ref 1.7–7.7)

## 2011-05-17 LAB — RENAL FUNCTION PANEL
Albumin: 2.7 g/dL — ABNORMAL LOW (ref 3.5–5.2)
GFR calc Af Amer: >60 mL/min (ref >60)
GFR calc non Af Amer: 55 mL/min — ABNORMAL LOW (ref >60)
Glucose, Bld: 151 mg/dL — ABNORMAL HIGH (ref 70–99)
Phosphorus: 4.1 mg/dL (ref 2.3–4.6)
Potassium: 3.8 mEq/L (ref 3.5–5.1)
Sodium: 140 mEq/L (ref 135–145)

## 2011-05-18 LAB — URINALYSIS, ROUTINE W REFLEX MICROSCOPIC
Bilirubin Urine: NEGATIVE
Ketones, ur: NEGATIVE mg/dL
Nitrite: NEGATIVE
Protein, ur: NEGATIVE mg/dL
Urobilinogen, UA: 1 mg/dL (ref 0.0–1.0)

## 2011-05-20 LAB — BASIC METABOLIC PANEL
BUN: 30 mg/dL — ABNORMAL HIGH (ref 6–23)
Calcium: 8 mg/dL — ABNORMAL LOW (ref 8.4–10.5)
GFR calc Af Amer: >60 mL/min (ref >60)
GFR calc non Af Amer: 54 mL/min — ABNORMAL LOW (ref >60)
Potassium: 3.9 mEq/L (ref 3.5–5.1)
Sodium: 138 mEq/L (ref 135–145)

## 2011-05-20 LAB — CBC
HCT: 30.1 % — ABNORMAL LOW (ref 39.0–52.0)
MCH: 27.4 pg (ref 26.0–34.0)
MCHC: 31.9 g/dL (ref 30.0–36.0)
RDW: 16.7 % — ABNORMAL HIGH (ref 11.5–15.5)

## 2011-05-21 LAB — URINE CULTURE
Colony Count: >=100000
Culture  Setup Time: 201207011803

## 2011-05-21 LAB — RENAL FUNCTION PANEL
Albumin: 3 g/dL — ABNORMAL LOW (ref 3.5–5.2)
BUN: 33 mg/dL — ABNORMAL HIGH (ref 6–23)
Chloride: 98 mEq/L (ref 96–112)
Creatinine, Ser: 1.86 mg/dL — ABNORMAL HIGH (ref 0.50–1.35)
GFR calc non Af Amer: 39 mL/min — ABNORMAL LOW (ref >60)
Phosphorus: 4.6 mg/dL (ref 2.3–4.6)

## 2011-05-22 LAB — BASIC METABOLIC PANEL
BUN: 35 mg/dL — ABNORMAL HIGH (ref 6–23)
Calcium: 8.1 mg/dL — ABNORMAL LOW (ref 8.4–10.5)
Creatinine, Ser: 1.6 mg/dL — ABNORMAL HIGH (ref 0.50–1.35)
GFR calc Af Amer: 57 mL/min — ABNORMAL LOW (ref >60)
GFR calc non Af Amer: 47 mL/min — ABNORMAL LOW (ref >60)
Glucose, Bld: 283 mg/dL — ABNORMAL HIGH (ref 70–99)
Potassium: 4.2 mEq/L (ref 3.5–5.1)

## 2011-05-24 LAB — CBC
Hemoglobin: 9.1 g/dL — ABNORMAL LOW (ref 13.0–17.0)
MCH: 27.2 pg (ref 26.0–34.0)
MCHC: 31.2 g/dL (ref 30.0–36.0)
Platelets: 215 10*3/uL (ref 150–400)
RDW: 17.1 % — ABNORMAL HIGH (ref 11.5–15.5)

## 2011-05-24 LAB — BASIC METABOLIC PANEL
Calcium: 8.3 mg/dL — ABNORMAL LOW (ref 8.4–10.5)
GFR calc Af Amer: >60 mL/min (ref >60)
GFR calc non Af Amer: 51 mL/min — ABNORMAL LOW (ref >60)
Glucose, Bld: 234 mg/dL — ABNORMAL HIGH (ref 70–99)
Potassium: 4.3 mEq/L (ref 3.5–5.1)
Sodium: 137 mEq/L (ref 135–145)

## 2011-05-27 LAB — CBC
Hemoglobin: 9.3 g/dL — ABNORMAL LOW (ref 13.0–17.0)
MCH: 27.3 pg (ref 26.0–34.0)
Platelets: 211 10*3/uL (ref 150–400)
RBC: 3.41 MIL/uL — ABNORMAL LOW (ref 4.22–5.81)
WBC: 6.8 10*3/uL (ref 4.0–10.5)

## 2011-05-27 LAB — RENAL FUNCTION PANEL
CO2: 28 mEq/L (ref 19–32)
Calcium: 8.3 mg/dL — ABNORMAL LOW (ref 8.4–10.5)
GFR calc Af Amer: >60 mL/min (ref >60)
GFR calc non Af Amer: 55 mL/min — ABNORMAL LOW (ref >60)
Glucose, Bld: 208 mg/dL — ABNORMAL HIGH (ref 70–99)
Potassium: 3.9 mEq/L (ref 3.5–5.1)
Sodium: 137 mEq/L (ref 135–145)

## 2011-05-30 ENCOUNTER — Ambulatory Visit: Payer: Self-pay | Admitting: Cardiovascular Disease

## 2011-06-05 NOTE — Discharge Summary (Signed)
[name], [name]             ACCOUNT NO.:  [id number]  MEDICAL RECORD NO.:  [id number]  LOCATION:  1228                         FACILITY:  [facility]  PHYSICIAN:  Kalman Shan, MD   DATE OF BIRTH:  [birth year]  DATE OF ADMISSION:  04/17/2011 DATE OF DISCHARGE:  05/01/2011                              DISCHARGE SUMMARY   FINAL DIAGNOSES: 1. 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 2. Obstructive sleep apnea with presumed element of obesity-     hypoventilation syndrome. 3. History of coronary artery disease, status post stenting with a     known ejection fraction of 35%. 4. Cardiac arrest following airway dislodgement. 5. Pain and depression. 6. Gout. 7. Chronic kidney disease. 8. Obesity. 9. Diabetes. 10.Debilitation. 11.Methicillin-resistant Staphylococcus aureus tracheobronchitis.  He     is now status post 7 days of vancomycin for this. 12.Tracheal site stoma ulceration.  For this, he is getting wet-to-dry     dressings as directed by ENT.  PROCEDURES: 1. Tracheostomy; this was placed by Dr. Serena Colonel on Apr 16, 2011. 2. Right femoral A-line; this was placed on 06/02 and removed. 3. Left femoral triple-lumen sheath placed on 06/02, removed on 06/05.     He now has a right-sided PICC line placed on April 22, 2011.  MICROBIOLOGY DATA:  MRSA positive on Apr 17, 2011.  On April 21, 2011, he had a MRSA-positive sputum culture.  All other cultures have been negative.  CURRENT LABORATORY DATA:  April 29, 2011, sodium 133, potassium 4, chloride 104, CO2 22, glucose 88, BUN 36, creatinine 1.45.  Hemoglobin 10.6, hematocrit 35, white blood cell count 11.5, platelet count 240.  CONSULTANTS:  Ears, nose, throat surgery, Dr. Serena Colonel.  BRIEF HISTORY:  This is a 45 year old male patient who was admitted on Apr 17, 2011, with angioedema in the setting of a peanut allergy. Because of his underlying obesity and obstructive sleep apnea,  he required emergent airway with emergent tracheostomy.  He was recovering with his tracheostomy and had a cardiac arrest on April 19, 2011, secondary to airway dislodgement.  He was transferred emergently to the Intensive Care at which time we were asked to assume his care.  PAST MEDICAL HISTORY:  Obstructive sleep apnea; diabetes; coronary artery disease, status post stenting; gout; peripheral vascular disease. He is status post a left BKA.  Also, chronic renal failure.  HOSPITAL COURSE: 1. 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  [name] was admitted to the Intensive Care following     tracheostomy dislodgement on 04/19/2011.  Cardiac arrest was noted     to be 15 minutes in timeframe.  He did receive mechanical     ventilation through a false tract with an Ambu bag at some point     during the resuscitation efforts.  He had an emergent tracheostomy     replaced during the code.  Since that time, [name] has been     tracheostomy dependent.  He  has got an underlying history of     obstructive sleep apnea for which he was using a CPAP device at     home.  At this point, there is some question as to whether or not     he also has some underlying obesity-hypoventilation syndrome,     perhaps explaining nocturnal desaturations without mechanical     ventilator.  Since this time [name] has been slowly working     on rehabilitation efforts.  He is on trach collar during the day,     with Passy-Muir valve trials.  He is currently on a dysphagia 3     diet, and working on rehabilitation efforts.  At this point,     recommendations will be to continue to work on Passy-Muir valve     trials, continue to use mechanical ventilation at night with the     cuff inflated and deflate the tracheostomy cuff during the  day for     Passy-Muir trials.  At this point, he has significant degree of     debilitation and would not recommend decannulation without     significant improvement in functional status.  Should he be     evaluated for extubation, he does need to have his nocturnal oxygen     requirements evaluated, specifically the question yet to be     answered is whether or not he does indeed need just CPAP support or     some assisted ventilatory support as well prior to discharge. 2. History of coronary artery disease with ejection fraction of 35%.     He is now status post cardiac arrest secondary to airway     obstruction and tracheostomy dislodgement.  He is status post 50     minutes of CPR.  At this point, he will continue Zebeta, aspirin     and Plavix.  He does have a history of renal insufficiency, also     question of upper airway instability, therefore would recommend     staying away from ACE inhibitors, would consider possibly low-dose     ARB if he continues to improve. 3. Pain:  For this, he will continue scheduled Neurontin, Zyloprim,     and colchicine. 4. Depression:  Question of anxiety status post code.  Psychiatry was     consulted; unfortunately, they were unable to develop a     relationship with [name] prior to discharge.  Would recommend     possible psychiatric evaluation and counseling during his stay at     [facility] for further benefit in his rehabilitation/function. 5. Stage 3 kidney disease:  Currently his renal function is stable.     We are aiming to continue a negative fluid balance as long as his     BUN and creatinine maintain. 6. Obesity:  Certainly [name] would benefit from weight loss.     He is currently on dysphagia 3 diet as recommended by speech     therapy.  Ultimately, he will need significant weight loss if he     hopes to be liberated from mechanical ventilation. 7. Diabetes:  Plan for this is to continue sliding scale insulin.  DISCHARGE  MEDICATIONS: 1. Zebeta 5 mg p.o. b.i.d. 2. Aspirin 81 mg daily. 3. Neurontin 300 mg p.o. b.i.d. 4. Protonix 40 mg daily. 5. Sodium chloride at keep vein open 20 mL an hour. 6. Plavix 75 mg daily. 7. Lantus 20 units daily. 8. Sliding scale NovoLog insulin  2 to 5 units at bedtime for blood     glucose 70 to 200 equals 0 units, 201 to 250 equals 2 units, 251 to     300 equals 3 units, 301 to 350 equals 4 units, 351 to 400 equals 5     units, 401 equals call physician. 9. NovoLog t.i.d. sliding scale insulin for glucose 70 to 120 equals     no units, 121 to 150 equals 3 units, 151 to 200 equals 4 units, 201     to 250 equals 7 units, 251 to 300 equals 11 units, 301 to 350     equals 15 units, 351 to 400 equals 20 units.  He also gets a     standard 4 units of NovoLog with each meal. 10.Colcrys 0.6 mg tab p.o. daily. 11.Zyloprim 100 mg tablet, 200 mg daily. 12.Subcu heparin 5000 units q.8 h. 13.Nitrostat 0.4 mg p.r.n. 14.Ventolin HFA 90 mcg inhaled p.r.n. 15.Xanax 0.5 mg tab daily. 16.Roxanol 5 mg to 10 mg p.o. q.6 h p.r.n. pain.  DISCHARGE INSTRUCTIONS: 1. Discharge activity:  [name] is cleared for physical therapy     and occupational therapy at the discretion of primary care     physician at [facility]. 2. Ventilator settings:  Currently [name] is on CPAP and     pressure support with pressure support of 12 at night and PEP of 6     with FIO2 of 30%.  He is on aerosol trach collar during the day     using Passy-Muir valve intermittently.  DISPOSITION:  [name] has met maximum benefit from inpatient care. He is now medically cleared for transfer to [facility] where remainder of his rehabilitation efforts will be undergone.  Dictated For:  Kalman Shan, MD     Zenia Resides, NP   ______________________________ Kalman Shan, MD    PB/MEDQ  D:  04/30/2011  T:  04/30/2011  Job:  [id number]  Electronically Signed by Zenia Resides NP on 05/29/2011 04:05:57 PM Electronically Signed by Kalman Shan MD on 06/05/2011 03:04:35 AM MedRecNo: [id number] [facility], Account: [id number], DocSeq: [id number] 35 minute discharge planning Electronically Signed by Kalman Shan MD on 06/05/2011 03:04:22 AM

## 2011-06-09 ENCOUNTER — Telehealth: Payer: Self-pay | Admitting: Pulmonary Disease

## 2011-06-09 NOTE — Telephone Encounter (Signed)
Spoke with the pt and he states he has "capped" off his trach and has been breathing well all day. He states he has been using a machine at night that blows mist through his trach, but he wants to know since it is capped off should he go back to using his Cpap at night or should he take the cap off the trach at night? Pt is set for a HFU on 06-20-11. Pt is not on any oxygen through his trach.   Please advise.Carron Curie, CMA

## 2011-06-09 NOTE — Telephone Encounter (Signed)
Pt advised. Jennifer Castillo, CMA  

## 2011-06-09 NOTE — Telephone Encounter (Signed)
Leave trach uncapped at night for now until further direction by his primary sleep MD

## 2011-06-09 NOTE — Telephone Encounter (Signed)
[id number].Jennifer Castillo, CMA  

## 2011-06-15 NOTE — Consult Note (Signed)
[name], [name]             ACCOUNT NO.:  [id number]  MEDICAL RECORD NO.:  [id number]           PATIENT TYPE:  I  LOCATION:  1226                         FACILITY:  [facility]  PHYSICIAN:  Baltazar Najjar, MD     DATE OF BIRTH:  Jan 21, 1966  DATE OF CONSULTATION: DATE OF DISCHARGE:                                CONSULTATION   CONSULTANT PHYSICIAN:  Jefry H. Pollyann Kennedy, M.D. with ENT.  REASON FOR CONSULTATION:  Management of medical problems.  HISTORY OF PRESENT ILLNESS:  [name] is a 45 year old African-American morbidly obese man with history of diabetes, hypertension, coronary artery disease and other medical problems as dictated below.  He was brought into the Emergency Room today with chief complaint of tongue and throat swelling, drooling, hoarse voice after he ate not at home today.  Patient was seen by Dr. Pollyann Kennedy and the was taken to OR and underwent tracheostomy.  Patient is currently lethargic postoperatively and he was unable to communicate.  History is obtained from the chart.  PAST MEDICAL HISTORY:  Significant for: 1. Diabetes mellitus type 2. 2. History of coronary artery disease, status post stent. 3. History of ST elevation MRI in 2008. 4. Obstructive sleep apnea. 5. Gouty arthritis. 6. Peripheral vascular disease, status post left BKA. 7. CKD, baseline creatinine 1.4 back in January 2012, which was stage     3 CKD.  ALLERGIES: 1. SEPTRA. 2. ACE INHIBITOR.  HOME MEDICATIONS:  Medication reconciliation form still pending at the time of dictation; however, as per ER notes, he was on: 1. Oxycodone 5 mg p.r.n. 2. Zyrtec 20 mg p.r.n. 3. Allopurinol 100 mg twice daily. 4. Carvedilol 6.25 mg twice daily. 5. Plavix 75 mg once daily. 6. Colcrys 0.6 mg once daily. 7. Diazepam 2.5 mg three times a day. 8. Gabapentin 300 mg twice a day. 9. Loratadine 10 mg once a day. 10.Metolazone 10 mg specialized dosing. 11.Reglan 10 mg three times a  day. 12.Spironolactone 25 mg once a day. 13.Torsemide 40 mg twice a day. 14.Ventolin inhaler and vitamin E oral.  As I stated above those medications needs to be verified and confirmed by pharmacy.  REVIEW OF SYSTEMS:  Unable to obtain.  SOCIAL HISTORY:  Unable to obtain; however, as per the ER notes stated nonsmoker, nondrinker, no drug abuse.  FAMILY HISTORY:  Unable to obtain.  PHYSICAL EXAMINATION:  VITAL SIGNS:  Blood pressure 143/101, pulse 86, O2 sat 100% on the vent. GENERAL:  He is alert, however, lethargic.  Tracheostomy noted.  He have swollen tongue. LUNGS:  Showed decreased breathing sounds.  No rales or wheezing. CARDIOVASCULAR:  S1, S2, regular rhythm and rate. ABDOMEN:  Obese, soft, nontender.  Bowel sounds positive. EXTREMITIES:  Right lower extremity showed 1+ edema, left extremity showed left BKA.  LABORATORY DATA:  Sodium 131, BUN 44, creatinine 1.7, potassium 3.7. PTT 26.  INR 1.13.  WBC is 4.3, hemoglobin 14.9, hematocrit 46.1, platelet 239,000, eosinophils 1%.  ASSESSMENT: 1. Angioedema. 2. Suspected allergic reaction to nuts. 3. Diabetes mellitus type 2. 4. Hypertension. 5. History of congestive heart failure. 6. Chronic kidney disease, stage III 7. History of  coronary artery disease, status post stent. 8. Obstructive sleep apnea.  PLAN:  Patient is status post tracheostomy and vent management as per ENT.  For his medical problems: 1. Diabetes mellitus type 2:  His medication reconciliation form is     still pending.  Patient is unable to give me any history at this     time and as per ER records, there was no hypoglycemic agents or     insulin listed.  Patient will be placed on sensitive insulin     sliding scale for now and we will monitor his CBG level awaiting     medication reconciliation from to confirm his home meds. 2. For hypertension:  We will continue with his home medicine     including Coreg and Aldactone and adjust as needed for  optimal     control. 3. For obstructive sleep apnea:  Patient is now status post     tracheostomy. 4. For coronary artery disease:  Continue with his home meds including     Plavix and Coreg.  I am not sure if he is already on aspirin as     well, that is not listed.  Again, we are going to have to follow     his medication reconciliation form and continue his cardiac     medication. 5. CKD, stage III:  His baseline creatinine about 1.4, creatinine     increased to 1.7.  Patient is probably slightly dehydrated.  It is     hard to assess volume status given his size.  We would request     chest x-ray and we can     probably hold some of his diuretics as well and reassess his volume     status. 6. DVT and GI prophylaxis.  Thank you for consult.  We will follow along with you.          ______________________________ Baltazar Najjar, MD     SA/MEDQ  D:  04/17/2011  T:  04/17/2011  Job:  [id number]  Electronically Signed by Hannah Beat MD on 06/15/2011 12:57:33 PM

## 2011-06-16 ENCOUNTER — Other Ambulatory Visit: Payer: Self-pay

## 2011-06-19 ENCOUNTER — Telehealth: Payer: Self-pay | Admitting: Pulmonary Disease

## 2011-06-19 NOTE — Telephone Encounter (Signed)
Reply by VS via e-mail handed to me by Budd Palmer:  "FYI he had trach done by Dr Serena Colonel with ENT and I belive he was supposed to follow up with ENT for trach issues."  Called spoke with patient who is confused and frustrated about his trach and supplies is wondering why he needs to follow up here in the office (8.3.12) if VS is not going to handle his trach.  It is the understanding of the office that ENT is not handling trach follow up.  Will forward this to VS and discuss with him when he returns to the office for clinic this afternoon.  Will call pt back.

## 2011-06-19 NOTE — Telephone Encounter (Signed)
Called spoke with patient at length about VS's recs.  Pt frustrated that he was not informed who would be taking over his trach care, not so much with this office but with ENT.  Pt will be out of his trach supplies tomorrow and is concerned about this.  Advised pt to call Dr Lucky Rathke office first thing in the morning and to make sure he voices his concerns with them.  Pt will call back tomorrow re: his appt with VS depending on what he is told by Dr Lucky Rathke office and his trach care.  Will sign off on msg.

## 2011-06-19 NOTE — Telephone Encounter (Signed)
[phone]--please call back on cell.

## 2011-06-19 NOTE — Telephone Encounter (Signed)
Received message from Dr Maurice March dated 8.1.12 @ 7:21pm:  [name] has damaged some of his trach supplies and is confronting insurance ref. Placement.  (told to pay out of pocket).  He has enough through tomorrow.  I asked him to call clinic in AM for assistance.  Message was also forwarded to VS to address.

## 2011-06-19 NOTE — Telephone Encounter (Signed)
I do not follow patients for tracheostomy care.  I can follow him for his obstructive sleep apnea if he would like.  He would need to either follow with ENT or another provider in [facility] for tracheostomy care.

## 2011-06-20 ENCOUNTER — Ambulatory Visit: Payer: Medicare (Managed Care) | Admitting: Pulmonary Disease

## 2011-07-08 ENCOUNTER — Encounter: Payer: Self-pay | Admitting: Orthopedic Surgery

## 2011-07-19 ENCOUNTER — Encounter: Payer: Self-pay | Admitting: Orthopedic Surgery

## 2011-08-14 LAB — D-DIMER, QUANTITATIVE: D-Dimer, Quant: 3.96 — ABNORMAL HIGH

## 2011-08-14 LAB — CK TOTAL AND CKMB (NOT AT ARMC)
CK, MB: 10.6 — ABNORMAL HIGH
Total CK: 313 — ABNORMAL HIGH

## 2011-08-14 LAB — DIFFERENTIAL
Basophils Relative: 0
Eosinophils Absolute: 0.2
Eosinophils Relative: 3
Monocytes Absolute: 0.6
Monocytes Relative: 8
Neutrophils Relative %: 62

## 2011-08-14 LAB — CBC
HCT: 41.5
MCHC: 33
MCV: 84
RBC: 4.94

## 2011-08-14 LAB — POCT CARDIAC MARKERS
Operator id: 264031
Operator id: 264031
Troponin i, poc: 0.09 — ABNORMAL HIGH

## 2011-08-14 LAB — POCT I-STAT, CHEM 8
BUN: 62 — ABNORMAL HIGH
Calcium, Ion: 1.18
Hemoglobin: 15
TCO2: 27

## 2011-08-14 LAB — TROPONIN I: Troponin I: 0.02

## 2011-08-19 LAB — CBC
HCT: 37.8 — ABNORMAL LOW
HCT: 40.1
MCHC: 33.6
MCV: 84.2
MCV: 84.2
Platelets: 245
Platelets: 257
RBC: 4.49
RDW: 15.1
WBC: 6.6

## 2011-08-19 LAB — DIFFERENTIAL
Basophils Absolute: 0
Eosinophils Absolute: 0.2
Eosinophils Relative: 2

## 2011-08-19 LAB — GLUCOSE, CAPILLARY
Glucose-Capillary: 163 — ABNORMAL HIGH
Glucose-Capillary: 167 — ABNORMAL HIGH
Glucose-Capillary: 99

## 2011-08-19 LAB — POCT I-STAT, CHEM 8
BUN: 53 — ABNORMAL HIGH
Calcium, Ion: 1.04 — ABNORMAL LOW
Creatinine, Ser: 1.8 — ABNORMAL HIGH
Glucose, Bld: 214 — ABNORMAL HIGH
TCO2: 29

## 2011-08-19 LAB — POCT CARDIAC MARKERS
CKMB, poc: 15.6
Myoglobin, poc: 238

## 2011-08-19 LAB — CARDIAC PANEL(CRET KIN+CKTOT+MB+TROPI)
CK, MB: 10.1 — ABNORMAL HIGH
Total CK: 350 — ABNORMAL HIGH
Total CK: 380 — ABNORMAL HIGH
Troponin I: 0.01
Troponin I: 0.01

## 2011-08-19 LAB — BASIC METABOLIC PANEL
BUN: 51 — ABNORMAL HIGH
Calcium: 8.3 — ABNORMAL LOW
Chloride: 101
Creatinine, Ser: 1.5
GFR calc Af Amer: >60

## 2011-08-19 LAB — PROTIME-INR: Prothrombin Time: 13.6

## 2011-09-04 LAB — BASIC METABOLIC PANEL
BUN: 62 — ABNORMAL HIGH
BUN: 63 — ABNORMAL HIGH
BUN: 65 — ABNORMAL HIGH
BUN: 66 — ABNORMAL HIGH
CO2: 22
CO2: 23
Calcium: 8.4
Calcium: 8.4
Calcium: 8.4
Calcium: 8.6
Chloride: 93 — ABNORMAL LOW
Chloride: 96
Creatinine, Ser: 2.31 — ABNORMAL HIGH
Creatinine, Ser: 2.97 — ABNORMAL HIGH
Creatinine, Ser: 3.1 — ABNORMAL HIGH
Creatinine, Ser: 3.23 — ABNORMAL HIGH
GFR calc Af Amer: 27 — ABNORMAL LOW
GFR calc Af Amer: 28 — ABNORMAL LOW
GFR calc Af Amer: 38 — ABNORMAL LOW
GFR calc non Af Amer: 21 — ABNORMAL LOW
GFR calc non Af Amer: 23 — ABNORMAL LOW
GFR calc non Af Amer: 31 — ABNORMAL LOW
Glucose, Bld: 109 — ABNORMAL HIGH
Glucose, Bld: 81
Potassium: 4.5
Sodium: 131 — ABNORMAL LOW
Sodium: 134 — ABNORMAL LOW

## 2011-09-04 LAB — CULTURE, BLOOD (ROUTINE X 2)
Culture: NO GROWTH
Culture: NO GROWTH

## 2011-09-04 LAB — URINE CULTURE
Colony Count: NO GROWTH
Special Requests: NEGATIVE

## 2011-09-04 LAB — CBC
HCT: 39.7
Hemoglobin: 13
MCHC: 32.7
MCHC: 32.8
MCV: 81.4
MCV: 82.5
Platelets: 214
Platelets: 250
RBC: 5.01
RDW: 15.5 — ABNORMAL HIGH

## 2011-09-04 LAB — VANCOMYCIN, TROUGH: Vancomycin Tr: 21.2 — ABNORMAL HIGH

## 2011-10-27 ENCOUNTER — Other Ambulatory Visit: Payer: Self-pay | Admitting: Internal Medicine

## 2012-01-05 ENCOUNTER — Other Ambulatory Visit: Payer: Self-pay | Admitting: Internal Medicine

## 2012-01-14 ENCOUNTER — Encounter: Payer: Self-pay | Admitting: Nurse Practitioner

## 2012-01-14 ENCOUNTER — Encounter: Payer: Self-pay | Admitting: Cardiothoracic Surgery

## 2012-01-16 ENCOUNTER — Encounter: Payer: Self-pay | Admitting: Cardiothoracic Surgery

## 2012-01-16 ENCOUNTER — Encounter: Payer: Self-pay | Admitting: Nurse Practitioner

## 2012-02-12 ENCOUNTER — Other Ambulatory Visit: Payer: Self-pay | Admitting: Internal Medicine

## 2012-03-06 ENCOUNTER — Other Ambulatory Visit: Payer: Self-pay | Admitting: Internal Medicine

## 2012-04-13 ENCOUNTER — Other Ambulatory Visit: Payer: Self-pay | Admitting: Internal Medicine

## 2012-04-13 NOTE — Telephone Encounter (Signed)
Patient has not been seen here.  Please advise.

## 2012-08-25 ENCOUNTER — Other Ambulatory Visit: Payer: Self-pay | Admitting: Internal Medicine

## 2012-08-25 NOTE — Telephone Encounter (Signed)
Doesn't look like this patient has ever been seen by Dr. Darrick Huntsman, is this your patient Dr. Darrick Huntsman?

## 2012-08-27 NOTE — Telephone Encounter (Signed)
He used to be but I  not seen this patient in over 15 months . He sees multiple specialists who should be able to refill this but I won't

## 2012-10-19 ENCOUNTER — Other Ambulatory Visit: Payer: Self-pay | Admitting: Internal Medicine

## 2012-10-20 NOTE — Telephone Encounter (Signed)
Already done

## 2012-12-13 ENCOUNTER — Other Ambulatory Visit: Payer: Self-pay | Admitting: Internal Medicine

## 2013-01-17 ENCOUNTER — Other Ambulatory Visit: Payer: Self-pay | Admitting: Internal Medicine

## 2013-09-08 ENCOUNTER — Ambulatory Visit: Payer: Self-pay | Admitting: Pain Medicine

## 2013-10-01 ENCOUNTER — Ambulatory Visit: Payer: Self-pay | Admitting: Pain Medicine

## 2013-10-17 ENCOUNTER — Ambulatory Visit: Payer: Self-pay | Admitting: Pain Medicine

## 2014-02-04 ENCOUNTER — Other Ambulatory Visit: Payer: Self-pay | Admitting: Internal Medicine

## 2014-02-06 NOTE — Telephone Encounter (Signed)
Please advise cannot find an OV for this patient. Looks like it was authorized 01/18/13 by this office.

## 2014-02-06 NOTE — Telephone Encounter (Signed)
DO NOT REFILL.  PATIENT IS A FORMER PATIENT AND HAS NOT BEEN SEEN BY ME IN 3 YRS

## 2015-09-13 ENCOUNTER — Encounter: Payer: Self-pay | Admitting: Podiatry

## 2015-09-13 ENCOUNTER — Ambulatory Visit (INDEPENDENT_AMBULATORY_CARE_PROVIDER_SITE_OTHER): Payer: Medicare HMO

## 2015-09-13 ENCOUNTER — Ambulatory Visit (INDEPENDENT_AMBULATORY_CARE_PROVIDER_SITE_OTHER): Payer: Medicare HMO | Admitting: Podiatry

## 2015-09-13 VITALS — BP 164/86 | HR 79 | Resp 12

## 2015-09-13 DIAGNOSIS — L84 Corns and callosities: Secondary | ICD-10-CM | POA: Diagnosis not present

## 2015-09-13 DIAGNOSIS — E11610 Type 2 diabetes mellitus with diabetic neuropathic arthropathy: Secondary | ICD-10-CM

## 2015-09-13 DIAGNOSIS — R52 Pain, unspecified: Secondary | ICD-10-CM

## 2015-09-13 DIAGNOSIS — E1149 Type 2 diabetes mellitus with other diabetic neurological complication: Secondary | ICD-10-CM | POA: Diagnosis not present

## 2015-09-13 NOTE — Progress Notes (Signed)
   Subjective:    Patient ID: [name], male    DOB: 25-Aug-1966, 49 y.o.   MRN: [id number]  HPI Patient presents the office they with concerns of the callus to the bottom of his right foot. He states his getting a pedicure yesterday and the technician was concerned that he had a blister to the bottom of his foot. He states he got home shoulder area of skin off this foot and he did not notice any blister. He still at the follow-up today to get established for care. He has a history of a BKA on the left side due to wounds and infections. 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 He was pre-procedure he did [facility] however he has not seen anyone for his foot and quite some time. Denies any history of open sores the right foot. He does wear diabetic inserts for which she gets every year. No other complaints at this time.   Review of Systems  HENT: Positive for sinus pressure.   Neurological: Positive for numbness.       Objective:   Physical Exam AAO x3, NAD BKA on the left DP/PT pulse 1/4 on the right (he states he has followed up with vascular, no history of stenting or bypass).  Protective sensation decreased with Dorann OuSimms Weinstein monofilament, decreased vibratory sensation. There is rocker-bottom deformity present on the right foot. In the plantar midfoot is a hyperkeratotic lesion. Upon debridement there is no Ulceration, drainage or other signs of infection. There is no blister formation this time. There is dry, xerotic skin to his foot. There are no other open lesions or pre-ulcerative lesions. No areas of tenderness to bilateral lower extremity is. There is no pain with calf compression, swelling, warmth, erythema.     Assessment & Plan:  49 year old male presents for diabetic risk assessment and for hyperkeratotic lesion right plantar midfoot -X-rays were obtained and reviewed with the patient.  -Treatment options discussed including all alternatives, risks, and  complications -Hyperkeratotic lesion debris without, complications. There is no ulceration or signs of blister at this time. There is no clinical signs of infection. Continue with diabetic inserts and offloading. Monitor closely for any skin breakdown and call the office immediately should any occur. -He goes to get pedicures he was nails trimmed however I recommended against this. -Follow-up in 3 months or sooner if any problems arise. In the meantime, encouraged to call the office with any questions, concerns, change in symptoms.   Ovid CurdMatthew Wagoner, DPM

## 2015-12-04 ENCOUNTER — Encounter: Payer: Medicare HMO | Attending: Surgery | Admitting: Surgery

## 2015-12-04 DIAGNOSIS — M109 Gout, unspecified: Secondary | ICD-10-CM | POA: Insufficient documentation

## 2015-12-04 DIAGNOSIS — J45909 Unspecified asthma, uncomplicated: Secondary | ICD-10-CM | POA: Diagnosis not present

## 2015-12-04 DIAGNOSIS — Z79899 Other long term (current) drug therapy: Secondary | ICD-10-CM | POA: Diagnosis not present

## 2015-12-04 DIAGNOSIS — Z9861 Coronary angioplasty status: Secondary | ICD-10-CM | POA: Insufficient documentation

## 2015-12-04 DIAGNOSIS — L97122 Non-pressure chronic ulcer of left thigh with fat layer exposed: Secondary | ICD-10-CM | POA: Diagnosis not present

## 2015-12-04 DIAGNOSIS — Z89512 Acquired absence of left leg below knee: Secondary | ICD-10-CM | POA: Insufficient documentation

## 2015-12-04 DIAGNOSIS — G4730 Sleep apnea, unspecified: Secondary | ICD-10-CM | POA: Insufficient documentation

## 2015-12-04 DIAGNOSIS — G629 Polyneuropathy, unspecified: Secondary | ICD-10-CM | POA: Insufficient documentation

## 2015-12-04 DIAGNOSIS — F329 Major depressive disorder, single episode, unspecified: Secondary | ICD-10-CM | POA: Insufficient documentation

## 2015-12-04 DIAGNOSIS — I429 Cardiomyopathy, unspecified: Secondary | ICD-10-CM | POA: Diagnosis not present

## 2015-12-04 DIAGNOSIS — Z930 Tracheostomy status: Secondary | ICD-10-CM | POA: Diagnosis not present

## 2015-12-04 DIAGNOSIS — E1165 Type 2 diabetes mellitus with hyperglycemia: Secondary | ICD-10-CM | POA: Diagnosis not present

## 2015-12-04 DIAGNOSIS — I10 Essential (primary) hypertension: Secondary | ICD-10-CM | POA: Diagnosis not present

## 2015-12-04 DIAGNOSIS — I252 Old myocardial infarction: Secondary | ICD-10-CM | POA: Diagnosis not present

## 2015-12-04 DIAGNOSIS — I739 Peripheral vascular disease, unspecified: Secondary | ICD-10-CM | POA: Insufficient documentation

## 2015-12-04 DIAGNOSIS — N289 Disorder of kidney and ureter, unspecified: Secondary | ICD-10-CM | POA: Insufficient documentation

## 2015-12-04 DIAGNOSIS — M70852 Other soft tissue disorders related to use, overuse and pressure, left thigh: Secondary | ICD-10-CM | POA: Insufficient documentation

## 2015-12-04 DIAGNOSIS — I2510 Atherosclerotic heart disease of native coronary artery without angina pectoris: Secondary | ICD-10-CM | POA: Insufficient documentation

## 2015-12-04 DIAGNOSIS — Z794 Long term (current) use of insulin: Secondary | ICD-10-CM | POA: Insufficient documentation

## 2015-12-04 DIAGNOSIS — Z6841 Body Mass Index (BMI) 40.0 and over, adult: Secondary | ICD-10-CM | POA: Diagnosis not present

## 2015-12-04 DIAGNOSIS — E11622 Type 2 diabetes mellitus with other skin ulcer: Secondary | ICD-10-CM | POA: Insufficient documentation

## 2015-12-04 DIAGNOSIS — E039 Hypothyroidism, unspecified: Secondary | ICD-10-CM | POA: Diagnosis not present

## 2015-12-05 NOTE — Progress Notes (Signed)
OMAIR, DETTMER ([id number]) Visit Report for 12/04/2015 Chief Complaint Document Details Patient Name: [name], [name] Date of Service: 12/04/2015 9:30 AM Medical Record Number: [id number] Patient Account Number: [id number] Date of Birth/Sex: May 22, 1966 (50 y.o. Male) Treating RN: Primary Care Physician: Angus Palms Other Clinician: Referring Physician: Treating Physician/Extender: Rudene Re in Treatment: 0 Information Obtained from: Patient Chief Complaint Patients presents for treatment of an open diabetic ulcer and pressure injury to his left lower thigh posteriorly for about a year. Electronic Signature(s) Signed: 12/04/2015 11:57:10 AM By: Evlyn Kanner MD, FACS Entered By: Evlyn Kanner on 12/04/2015 11:57:10 [name], [name] Kitchen ([id number]) -------------------------------------------------------------------------------- HPI Details Patient Name: Richard Kyle A. Date of Service: 12/04/2015 9:30 AM Medical Record Number: [id number] Patient Account Number: [id number] Date of Birth/Sex: 1966/10/23 (50 y.o. Male) Treating RN: Primary Care Physician: Angus Palms Other Clinician: Referring Physician: Treating Physician/Extender: Rudene Re in Treatment: 0 History of Present Illness Location: left lower thigh posteriorly Quality: Patient reports experiencing a dull pain to affected area(s). Severity: Patient states wound are getting worse. 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 Associated Signs and Symptoms: Patient reports having foul odor. HPI Description: 50 year old gentleman with past medical  history of diabetes mellitus with last hemoglobin A1c done in February 2016 was 8.7, hypertension, hypothyroidism, coronary artery disease, ischemic cardiomyopathy , nephropathy, obesity, peripheral neuropathy, peripheral vascular disease and sleep apnea. 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 He has had problems with his left BKA prosthesis for a long while and had a vacuum system and then had to be changed over to a sleeve system and has been working with the orthopedic and prosthetic department at [facility]. He has also had a plastic surgery opinion in the past. The ulceration on the posterior part of his lower thigh continued to be aggravating an enlarging and have a foul order and drainage. 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 Date of Service: 12/04/2015 9:30 AM Medical Record Number: [id number] Patient Account Number: [id number] Date of Birth/Sex: [birth year] (50 y.o. Male) Treating RN: Primary Care Physician: Angus Palms Other Clinician: Referring Physician: Treating Physician/Extender: Rudene Re in Treatment: 0 Constitutional . Pulse regular. Respirations normal and unlabored. Afebrile. . Eyes Nonicteric. Reactive to light. Ears, Nose, Mouth, and Throat Lips, teeth, and gums WNL.Marland Kitchen Moist mucosa without lesions. Neck supple and  nontender. No palpable supraclavicular or cervical adenopathy. Normal sized without goiter. Respiratory WNL. No retractions.. Cardiovascular femoral and popliteal pulses on the left lower extremity palpable. ABI could not be measured due to a BKA. No clubbing, cyanosis or edema. Gastrointestinal (GI) Abdomen without masses or tenderness.. No liver or spleen enlargement or tenderness.Marland Kitchen  Lymphatic No adneopathy. No adenopathy. No adenopathy. Musculoskeletal Adexa without tenderness or enlargement.. Digits and nails w/o clubbing, cyanosis, infection, petechiae, ischemia, or inflammatory conditions.. Integumentary (Hair, Skin) No suspicious lesions. No crepitus or fluctuance. No peri-wound warmth or erythema. No masses.Marland Kitchen Psychiatric Judgement and insight Intact.. No evidence of depression, anxiety, or agitation.. Notes he has several ulcerated areas on the left posterior thigh approximately 5 cm above the knee joint. These areas are down to the subcutaneous tissue have no active infection or drainage and washing it out with saline and gauze reveals that his healthy tissue and no need of debridement. Electronic Signature(s) Signed: 12/04/2015 12:01:41 PM By: Evlyn Kanner MD, FACS Entered By: Evlyn Kanner on 12/04/2015 12:01:41 [place], [name] ([id number]) -------------------------------------------------------------------------------- Physician Orders Details Patient Name: Richard Kyle A. Date of Service: 12/04/2015 9:30 AM Medical Record Number: [id number] Patient Account Number: [id number] Date of Birth/Sex: [birth year] (50 y.o. Male) Treating RN: Phillis Haggis Primary Care Physician: Angus Palms Other Clinician: Referring Physician: Treating Physician/Extender: Rudene Re in Treatment: 0 Verbal / Phone Orders: Yes Clinician: Pinkerton, Debi Read Back and Verified: Yes Diagnosis Coding Wound Cleansing Wound #1 Left,Lateral,Posterior Knee o Clean wound with  Normal Saline. Wound #2 Left,Midline,Posterior Knee o Clean wound with Normal Saline. Anesthetic Wound #1 Left,Lateral,Posterior Knee o Topical Lidocaine 4% cream applied to wound bed prior to debridement Wound #2 Left,Midline,Posterior Knee o Topical Lidocaine 4% cream applied to wound bed prior to debridement Primary Wound Dressing Wound #1 Left,Lateral,Posterior Knee o Aquacel Ag Wound #2 Left,Midline,Posterior Knee o Aquacel Ag Secondary Dressing Wound #1 Left,Lateral,Posterior Knee o Gauze and Kerlix/Conform o Foam Wound #2 Left,Midline,Posterior Knee o Gauze and Kerlix/Conform o Foam Dressing Change Frequency Wound #1 Left,Lateral,Posterior Knee o Change dressing every day. Wound #2 Left,Midline,Posterior Knee o Change dressing every day. CRISTINA, MATTERN ([id number]) Notes make an appt with surgeon at [facility]) Signed: 12/04/2015 3:35:32 PM By: Evlyn Kanner MD, FACS Signed: 12/04/2015 5:07:36 PM By: Alejandro Mulling Entered By: Alejandro Mulling on 12/04/2015 11:20:21 [name], [name] ([id number]) -------------------------------------------------------------------------------- Problem List Details Patient Name: Richard Kyle A. Date of Service: 12/04/2015 9:30 AM Medical Record Number: [id number] Patient Account Number: [id number] Date of Birth/Sex: 09-30-1966 (50 y.o. 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 ([id number]) -------------------------------------------------------------------------------- Progress Note Details Patient Name: Richard Kyle A. Date of Service: 12/04/2015 9:30 AM Medical Record Number: [id number] Patient Account Number: [id number] Date of Birth/Sex: Jan 03, 1966 (50 y.o. Male) Treating RN: Primary Care Physician: Angus Palms Other Clinician: Referring Physician: Treating Physician/Extender: Rudene Re in Treatment: 0 Subjective Chief Complaint Information obtained from Patient Patients presents for treatment of an open diabetic ulcer and pressure injury to his left lower thigh posteriorly for about a year. History of Present Illness (HPI) The following HPI elements were documented for the patient's wound: Location: left  lower thigh posteriorly Quality: Patient reports experiencing a dull pain to affected area(s). Severity: Patient states wound are getting worse. 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 50 year old gentleman with past medical history of diabetes mellitus with last hemoglobin A1c done in February 2016 was 8.7, hypertension, hypothyroidism, coronary artery disease, ischemic  cardiomyopathy , nephropathy, obesity, peripheral neuropathy, peripheral vascular disease and sleep apnea. 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 He has had problems with his left BKA prosthesis for a long while and had a vacuum system and then had to be changed over to a sleeve system and has been working with the orthopedic and prosthetic department at [facility]. He has also had a plastic surgery opinion in the past. The ulceration on the posterior part of his lower thigh continued to be aggravating an enlarging and have a foul order and drainage. 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 Patient has been treating wounds in the following manner: restore and aquacel. The wounds have been healed in the past but have re-opened. Laboratory tests have not been performed in the last month. Patient reportedly has tested positive for an antibiotic resistant organism. Patient reportedly has not tested positive for Soley, Adonys A. ([id number]) osteomyelitis. Patient reportedly has had testing performed to evaluate circulation in the legs. Patient experiences the following problems associated with their wounds: ABT for infection precaution. Patient History Information obtained from Patient. Allergies amitriptyline (Severity: Severe, Reaction: nausea), Pravachol (Severity: Severe, Reaction: muscles ache and elevated CK), Septra (Severity: Severe, Reaction: mouth ulcers), ACE Inhibitors (Severity: Severe, Reaction: increases creatinine), lovastatin (Severity: Severe, Reaction: myalgia), adhesive (Severity: Severe, Reaction: rash), Iodinated  Contrast Media - Oral and IV Dye (Severity: Severe, Reaction: kidney disorder), Tree Nuts (Severity: Severe, Reaction: anaphalaxis) Family History Hypertension - Mother, No family history of Cancer, Diabetes, Heart Disease, Hereditary Spherocytosis, Kidney Disease, Lung Disease, Seizures, Stroke, Thyroid Problems, Tuberculosis. Social History Never smoker, Marital Status - Married, Alcohol Use - Never, Drug Use - No History, Caffeine Use - Never. 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 Blood sugar is tested. Medical And Surgical History Notes Cardiovascular heart murmur, hx cardiomyopathy Genitourinary hx of kidney failure and hx of dialysis Psychiatric hx depression Review of Systems (ROS) [name], [name] ([id number]) Eyes Complains or has symptoms of Glasses / Contacts - glasses. Hematologic/Lymphatic The patient has no  complaints or symptoms. Respiratory Complains or has symptoms of Shortness of Breath. Gastrointestinal The patient has no complaints or symptoms. Endocrine Complains or has symptoms of Thyroid disease. Genitourinary The patient has no complaints or symptoms. Immunological Complains or has symptoms of Itching - eczema. Integumentary (Skin) Complains or has symptoms of Wounds. Musculoskeletal Complains or has symptoms of Muscle Weakness. Oncologic The patient has no complaints or symptoms. Medications albuterol sulfate not  specified not specified not specified magnesium not specified not specified not specified spironolactone not specified not specified not specified torsemide not specified not specified not specified carvedilol 6.25 mg tablet oral 1 1 tablet oral gabapentin 300 mg capsule oral 1 1 capsule oral loratadine 10 mg tablet oral 1 1 tablet oral allopurinol 100 mg tablet oral 1 1 tablet oral Novolog 100 unit/mL subcutaneous solution subcutaneous solution subcutaneous Toujeo SoloStar 300 unit/mL (1.5 mL) subcutaneous insulin pen subcutaneous insulin pen subcutaneous metoclopramide 10 mg tablet oral 1 1 tablet oral oxycodone-acetaminophen 5 mg-325 mg tablet oral tablet oral fluticasone 50 mcg/actuation nasal spray,suspension nasal spray,suspension nasal clopidogrel 75 mg tablet oral 1 1 tablet oral doxycycline hyclate 100 mg tablet oral 1 1 tablet oral levothyroxine 75 mcg tablet oral 1 1 tablet oral Objective Constitutional [name], [name] ([id number]) Pulse regular. Respirations normal and unlabored. Afebrile. Vitals Time Taken: 10:06 AM, Height: 66 in, Source: Stated, Weight: 278 lbs, Source: Stated, BMI: 44.9, Temperature: 97.8 F, Pulse: 72 bpm, Respiratory Rate: 20 breaths/min, Blood Pressure: 141/81 mmHg. Eyes Nonicteric. Reactive to light. Ears, Nose, Mouth, and Throat Lips, teeth, and gums WNL.Marland Kitchen Moist mucosa without lesions. Neck supple and nontender. No palpable supraclavicular or cervical adenopathy. Normal sized without goiter. Respiratory WNL. No retractions.. Cardiovascular femoral and popliteal pulses on the left lower extremity palpable. ABI could not be measured due to a BKA. No clubbing, cyanosis or edema. Gastrointestinal (GI) Abdomen without masses or tenderness.. No liver or spleen enlargement or tenderness.. Lymphatic No adneopathy. No adenopathy. No adenopathy. Musculoskeletal Adexa without tenderness or enlargement.. Digits and nails w/o clubbing, cyanosis,  infection, petechiae, ischemia, or inflammatory conditions.Marland Kitchen Psychiatric Judgement and insight Intact.. No evidence of depression, anxiety, or agitation.. General Notes: he has several ulcerated areas on the left posterior thigh approximately 5 cm above the knee joint. These areas are down to the subcutaneous tissue have no active infection or drainage and washing it out with saline and gauze reveals that his healthy tissue and no need of debridement. Integumentary (Hair, Skin) No suspicious lesions. No crepitus or fluctuance. No peri-wound warmth or erythema. No masses.. Wound #1 status is Open. Original cause of wound was Pressure Injury. The wound is located on the Left,Lateral,Posterior Knee. The wound measures 4.2cm length x 4cm width x 0.2cm depth; 13.195cm^2 area and 2.639cm^3 volume. The wound is limited to skin breakdown. There is no tunneling or undermining noted. There is a large amount of serosanguineous drainage noted. The wound margin is flat and intact. There is large (67-100%) red, pink granulation within the wound bed. There is a small (1-33%) amount of necrotic tissue within the wound bed including Adherent Slough. The periwound skin appearance exhibited: Localized Edema, Moist. Periwound temperature was noted as No Abnormality. The periwound has tenderness on palpation. [name], [name] ([id number]) Wound #2 status is Open. Original cause of wound was Pressure Injury. The wound is located on the Left,Midline,Posterior Knee. The wound measures 2cm length x 3.7cm width x 0.2cm depth; 5.812cm^2 area and 1.162cm^3 volume. The wound is limited to skin breakdown. There is no  tunneling or undermining noted. There is a large amount of serosanguineous drainage noted. The wound margin is flat and intact. There is large (67-100%) red, pink granulation within the wound bed. There is a small (1-33%) amount of necrotic tissue within the wound bed including Adherent Slough. The periwound  skin appearance exhibited: Localized Edema, Moist. Periwound temperature was noted as No Abnormality. The periwound has tenderness on palpation. 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 He has been using local care as per instructions from [facility] wound care system and has not ever offloaded this area by stopping to use his prosthetic leg. Offloading by using a wheelchair for an extended period of time is not an option. After carefully considering all therapeutic measures I have recommended: 1. Silver alginate and a foam padding to offload as well as possible this area. 2. Since he cannot offload by discontinuing his prosthetic leg the only other option is for surgical solution. 3. I have asked him to see the plastic surgery department at [facility] where he has previously been seen but not for over 6 months. 4. Revision of the scar tissue with possibly a local rotation flap may be his best option. 5. Return to the wound center for monitoring and support until he gets to see the plastic surgeon at [facility]. The patient understands the treatment plan and will be compliant. Plan Wound Cleansing: Ames, Ramil A. ([id number]) Wound #1 Left,Lateral,Posterior Knee: Clean wound with Normal Saline. Wound #2 Left,Midline,Posterior Knee: Clean wound with Normal Saline. Anesthetic: Wound #1 Left,Lateral,Posterior Knee: Topical Lidocaine 4% cream applied to wound bed prior to debridement Wound #2 Left,Midline,Posterior Knee: Topical Lidocaine 4%  cream applied to wound bed prior to debridement Primary Wound Dressing: Wound #1 Left,Lateral,Posterior Knee: Aquacel Ag Wound #2 Left,Midline,Posterior Knee: Aquacel Ag Secondary Dressing: Wound #1 Left,Lateral,Posterior Knee: Gauze and Kerlix/Conform Foam Wound #2 Left,Midline,Posterior Knee: Gauze and Kerlix/Conform Foam Dressing Change Frequency: Wound #1 Left,Lateral,Posterior Knee: Change dressing every day. Wound #2 Left,Midline,Posterior Knee: Change dressing every day. 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 He has been using local care as per instructions from [facility] wound care system and has not ever offloaded this area by stopping to use his prosthetic leg. Offloading by using a wheelchair for an extended period of time is not an option. After carefully considering all therapeutic measures I have recommended: 1. Silver alginate and a foam padding to offload as well as possible this area. 2. Since he cannot offload by discontinuing his prosthetic leg the only other option is for surgical solution. 3. I have asked him to see the plastic surgery department at [facility] where he has previously been seen but not for over 6 months. 4. Revision of the scar tissue with possibly a local rotation flap may be his best option. 5. Return to the wound center for monitoring and support until he  gets to see the plastic surgeon at [facility]. The patient understands the treatment plan and will be compliant. [name], [name] ([id number]) Electronic Signature(s) Signed: 12/04/2015 12:05:30 PM By: Evlyn Kanner MD, FACS Entered By: Evlyn Kanner on 12/04/2015 12:05:30 [name] ([id number]) -------------------------------------------------------------------------------- ROS/PFSH  Details Patient Name: Richard Kyle A. Date of Service: 12/04/2015 9:30 AM Medical Record Number: [id number] Patient Account Number: [id number] Date of Birth/Sex: 01-02-1966 (50 y.o. 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, VRE)o Date: 03/28/2015 Have you tested positive for osteomyelitis (bone infection)o No Have you had any tests for circulation on your legso Yes Who ordered the testo [facility] Where was the test doneo 2012 Have you had other problems associated with your woundso Other: ABT for infection precaution Eyes Complaints and Symptoms: Positive for: Glasses / Contacts - glasses Respiratory Complaints and Symptoms: Positive for: Shortness of Breath Medical History: Positive for: Asthma; Sleep Apnea Endocrine Complaints and Symptoms: Positive for: Thyroid disease Medical History: Positive for: Type II Diabetes Time with diabetes: 30 years Treated with: Insulin Raya, Maveric A. ([id number]) Blood sugar tested every day: Yes Tested : 4 times a day Immunological Complaints and Symptoms: Positive for: Itching - eczema Integumentary (Skin) Complaints and Symptoms: Positive for: Wounds Musculoskeletal Complaints and Symptoms: Positive for: Muscle Weakness Medical History: Positive for: Gout Ear/Nose/Mouth/Throat Medical History: Positive for: Chronic sinus  problems/congestion Negative for: Middle ear problems Hematologic/Lymphatic Complaints and Symptoms: No Complaints or Symptoms Medical History: Positive for: Anemia Cardiovascular Medical History: Positive for: Coronary Artery Disease; Hypertension; Myocardial Infarction - huge hx pt has had 4 Negative for: Vasculitis Past Medical History Notes: heart murmur, hx cardiomyopathy Gastrointestinal Complaints and Symptoms: No Complaints or Symptoms Genitourinary Complaints and Symptoms: No Complaints or Symptoms Medical HistoryAADHAV, [name] ([id number]) Past Medical History Notes: hx of kidney failure and hx of dialysis Neurologic Medical History: Positive for: Neuropathy Oncologic Complaints and Symptoms: No Complaints or Symptoms Psychiatric Medical History: Past Medical History Notes: hx depression HBO Extended History Items Ear/Nose/Mouth/Throat: Chronic sinus problems/congestion Family and Social History Cancer: No; Diabetes: No; Heart Disease: No; Hereditary Spherocytosis: No; Hypertension: Yes - Mother; Kidney Disease: No; Lung Disease: No; Seizures: No; Stroke: No; Thyroid Problems: No; Tuberculosis: No; Never smoker; Marital Status - Married; Alcohol Use: Never; Drug Use: No History; Caffeine Use: Never; Financial Concerns: No; Food, Clothing or Shelter Needs: No; Support System Lacking: No; Transportation Concerns: No; Advanced Directives: No; Patient does not want information on Advanced Directives; Do not resuscitate: No; Living Will: No; Medical Power of Attorney: No Physician Affirmation I have reviewed and agree with the above information. Electronic Signature(s) Signed: 12/04/2015 3:35:32 PM By: Evlyn Kanner MD, FACS Signed: 12/04/2015 5:07:36 PM By: Alejandro Mulling Previous Signature: 12/04/2015 10:30:22 AM Version By: Evlyn Kanner MD, FACS Entered By: Alejandro Mulling on 12/04/2015 10:33:57 Miggins, Oron [name] Kitchen  ([id number]) -------------------------------------------------------------------------------- SuperBill Details Patient Name: Richard Kyle A. Date of Service: 12/04/2015 Medical Record Number: [id number] Patient Account Number: [id number] Date  of Birth/Sex: 06-30-1966 (49 y.o. 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 M70.852 Other soft tissue disorders related to use, overuse and pressure, left thigh E66.01 Morbid (severe) obesity due to excess calories Facility Procedures CPT4 Code: 16109604 Description: [phone] - WOUND CARE VISIT-LEV 2 EST PT Modifier: Quantity: 1 Physician Procedures CPT4 Code Description: [id number] [id number] - WC PHYS LEVEL 4 - NEW PT ICD-10 Description Diagnosis E11.622 Type 2 diabetes mellitus with other skin ulcer L97.122 Non-pressure chronic ulcer of left thigh with fat la M70.852 Other soft tissue disorders related  to use, overuse E66.01 Morbid (severe) obesity due to excess calories Modifier: yer exposed and pressure, Quantity: 1 left thigh Electronic Signature(s) Signed: 12/04/2015 3:35:32 PM By: Evlyn Kanner MD, FACS Signed: 12/04/2015 5:07:36 PM By: Alejandro Mulling Previous Signature: 12/04/2015 12:06:09 PM Version By: Evlyn Kanner MD, FACS Entered By: Alejandro Mulling on 12/04/2015 15:08:41

## 2015-12-05 NOTE — Progress Notes (Addendum)
DORIN, [place] ([id number]) Visit Report for 12/04/2015 Allergy List Details Patient Name: [name], [name] Date of Service: 12/04/2015 9:30 AM Medical Record Number: [id number] Patient Account Number: [id number] Date of Birth/Sex: 28-Feb-1966 (50 y.o. Male) Treating RN: Phillis Haggis Primary Care Physician: Angus Palms Other Clinician: Referring Physician: Treating Physician/Extender: Rudene Re in Treatment: 0 Allergies Active Allergies amitriptyline Reaction: nausea Severity: Severe Pravachol Reaction: muscles ache and elevated CK Severity: Severe Septra Reaction: mouth ulcers Severity: Severe ACE Inhibitors Reaction: increases creatinine Severity: Severe lovastatin Reaction: myalgia Severity: Severe adhesive Reaction: rash Severity: Severe Iodinated Contrast Media - Oral and IV Dye Reaction: kidney disorder Severity: Severe Tree Nuts Reaction: anaphalaxis Severity: Severe [name], [name] ([id number]) Allergy Notes Electronic Signature(s) Signed: 12/04/2015 5:07:36 PM By: Alejandro Mulling Entered By: Alejandro Mulling on 12/04/2015 10:15:46 [name], [name] ([id number]) -------------------------------------------------------------------------------- Arrival Information Details Patient Name: Richard Kyle A. Date of Service: 12/04/2015 9:30 AM Medical Record Number: [id number] Patient Account Number: [id number] Date of Birth/Sex: 1966/09/25 (50 y.o. Male) Treating RN: Phillis Haggis Primary Care Physician: Angus Palms Other Clinician: Referring Physician: Treating Physician/Extender: Rudene Re in Treatment: 0 Visit Information Patient Arrived: Ambulatory Arrival Time: 10:03 Accompanied By: self Transfer Assistance: None Patient Identification Verified: Yes Secondary Verification Process Yes Completed: Patient Requires Transmission- No Based Precautions: Patient Has Alerts: Yes Patient Alerts: Patient on  Blood Thinner Plavix DM II Electronic Signature(s) Signed: 12/04/2015 5:07:36 PM By: Alejandro Mulling Entered By: Alejandro Mulling on 12/04/2015 10:04:56 [name], [name] ([id number]) -------------------------------------------------------------------------------- Clinic Level of Care Assessment Details Patient Name: Richard Kyle A. Date of Service: 12/04/2015 9:30 AM Medical Record Number: [id number] Patient Account Number: [id number] Date of Birth/Sex: 1966/01/21 (50 y.o. Male) Treating RN: Phillis Haggis Primary Care Physician: Angus Palms Other Clinician: Referring Physician: Treating Physician/Extender: Rudene Re in Treatment: 0 Clinic Level of Care Assessment Items TOOL 1 Quantity Score X - Use when EandM and Procedure is performed on INITIAL visit 1 0 ASSESSMENTS - Nursing Assessment / Reassessment  - General Physical Exam (combine w/ comprehensive assessment (listed just 0 below) when performed on new pt. evals) X - Comprehensive Assessment (HX, ROS, Risk Assessments, Wounds Hx, etc.) 1 25 ASSESSMENTS - Wound and Skin Assessment / Reassessment  - Dermatologic / Skin Assessment (not related to wound area) 0 ASSESSMENTS - Ostomy and/or Continence Assessment and Care  - Incontinence Assessment and Management 0  - Ostomy Care Assessment and Management (repouching, etc.) 0 PROCESS - Coordination of Care X - Simple Patient / Family Education for ongoing care 1 15  - Complex (extensive) Patient / Family Education for ongoing care 0  - Staff obtains Chiropractor, Records, Test Results / Process Orders 0  - Staff telephones HHA, Nursing Homes / Clarify orders / etc 0  - Routine Transfer to another Facility (non-emergent condition) 0  - Routine Hospital Admission (non-emergent condition) 0 X - New Admissions / Manufacturing engineer / Ordering NPWT, Apligraf, etc. 1 15  - Emergency Hospital Admission (emergent condition) 0 PROCESS - Special  Needs  - Pediatric / Minor Patient Management 0  - Isolation Patient Management 0 [name], [name] ([id number])  - Hearing / Language / Visual special needs 0  - Assessment of Community assistance (transportation, D/C planning, etc.) 0  - Additional assistance / Altered mentation 0  - Support Surface(s) Assessment (bed, cushion, seat, etc.) 0 INTERVENTIONS - Miscellaneous  - External ear exam 0  - Patient Transfer (multiple staff / Nurse, adult / Similar devices) 0  - Simple Staple /  Suture removal (25 or less) 0  - Complex Staple / Suture removal (26 or more) 0  - Hypo/Hyperglycemic Management (do not check if billed separately) 0  - Ankle / Brachial Index (ABI) - do not check if billed separately 0 Has the patient been seen at the hospital within the last three years: Yes Total Score: 55 Level Of Care: New/Established - Level 2 Electronic Signature(s) Signed: 12/04/2015 5:07:36 PM By: Alejandro Mulling Entered By: Alejandro Mulling on 12/04/2015 15:08:32 [name], [name] ([id number]) -------------------------------------------------------------------------------- Encounter Discharge Information Details Patient Name: Richard Kyle A. Date of Service: 12/04/2015 9:30 AM Medical Record Number: [id number] Patient Account Number: [id number] Date of Birth/Sex: 09/12/1966 (50 y.o. Male) Treating RN: Phillis Haggis Primary Care Physician: Angus Palms Other Clinician: Referring Physician: Treating Physician/Extender: Rudene Re in Treatment: 0 Encounter Discharge Information Items Discharge Pain Level: 0 Discharge Condition: Stable Ambulatory Status: Ambulatory Discharge Destination: Home Transportation: Private Auto Accompanied By: self Schedule Follow-up Appointment: Yes Medication Reconciliation completed and provided to Patient/Care Yes [name]: Provided on Clinical Summary of Care: 12/04/2015 Form Type Recipient Paper Patient  DP Electronic Signature(s) Signed: 12/04/2015 11:24:59 AM By: Gwenlyn Perking Entered By: Gwenlyn Perking on 12/04/2015 11:24:58 [name], [name] ([id number]) -------------------------------------------------------------------------------- Lower Extremity Assessment Details Patient Name: Richard Kyle A. Date of Service: 12/04/2015 9:30 AM Medical Record Number: [id number] Patient Account Number: [id number] Date of Birth/Sex: 11/18/1965 (50 y.o. Male) Treating RN: Ashok Cordia, Debi Primary Care Physician: Angus Palms Other Clinician: Referring Physician: Treating Physician/Extender: Rudene Re in Treatment: 0 Electronic Signature(s) Signed: 12/04/2015 5:07:36 PM By: Alejandro Mulling Entered By: Alejandro Mulling on 12/04/2015 10:34:21 [name], [name] ([id number]) -------------------------------------------------------------------------------- Multi Wound Chart Details Patient Name: Richard Kyle A. Date of Service: 12/04/2015 9:30 AM Medical Record Number: [id number] Patient Account Number: [id number] Date of Birth/Sex: [birth year] (50 y.o. Male) Treating RN: Phillis Haggis Primary Care Physician: Angus Palms Other Clinician: Referring Physician: Treating Physician/Extender: Rudene Re in Treatment: 0 Vital Signs Height(in): 66 Pulse(bpm): 72 Weight(lbs): 278 Blood Pressure 141/81 (mmHg): Body Mass Index(BMI): 45 Temperature(F): 97.8 Respiratory Rate 20 (breaths/min): Photos: [1:No Photos] [2:No Photos] [N/A:N/A] Wound Location: [1:Left Knee - Lateral, Posterior] [2:Left Knee - Midline, Posterior] [N/A:N/A] Wounding Event: [1:Pressure Injury] [2:Pressure Injury] [N/A:N/A] Primary Etiology: [1:Pressure Ulcer] [2:Pressure Ulcer] [N/A:N/A] Comorbid History: [1:Chronic sinus problems/congestion, Anemia, Asthma, Sleep Apnea, Coronary Artery Disease, Hypertension, Myocardial Infarction, Type II Diabetes, Gout, Neuropathy] [2:Chronic sinus  problems/congestion, Anemia, Asthma, Sleep Apnea,  Coronary Artery Disease, Hypertension, Myocardial Infarction, Type II Diabetes, Gout, Neuropathy] [N/A:N/A] Date Acquired: [1:11/17/2014] [2:11/17/2014] [N/A:N/A] Weeks of Treatment: [1:0] [2:0] [N/A:N/A] Wound Status: [1:Open] [2:Open] [N/A:N/A] Measurements L x W x D 4.2x4x0.2 [2:2x3.7x0.2] [N/A:N/A] (cm) Area (cm) : [1:13.195] [2:5.812] [N/A:N/A] Volume (cm) : [1:2.639] [2:1.162] [N/A:N/A] Classification: [1:Category/Stage II] [2:Category/Stage II] [N/A:N/A] HBO Classification: [1:Grade 1] [2:Grade 1] [N/A:N/A] Exudate Amount: [1:Large] [2:Large] [N/A:N/A] Exudate Type: [1:Serosanguineous] [2:Serosanguineous] [N/A:N/A] Exudate Color: [1:red, brown] [2:red, brown] [N/A:N/A] Foul Odor After [1:No] [2:Yes] [N/A:N/A] Cleansing: Odor Anticipated Due to N/A [2:No] [N/A:N/A] Product Use: Wound Margin: [1:Flat and Intact] [2:Flat and Intact] [N/A:N/A] Granulation Amount: Large (67-100%) Large (67-100%) N/A Granulation Quality: Red, Pink Red, Pink N/A Necrotic Amount: Small (1-33%) Small (1-33%) N/A Exposed Structures: Fascia: No Fascia: No N/A Fat: No Fat: No Tendon: No Tendon: No Muscle: No Muscle: No Joint: No Joint: No Bone: No Bone: No Limited to Skin Limited to Skin Breakdown Breakdown Epithelialization: None None N/A Periwound Skin Texture: Edema: Yes Edema: Yes N/A Periwound Skin Moist: Yes Moist: Yes N/A Moisture: Periwound Skin  Color: No Abnormalities Noted No Abnormalities Noted N/A Temperature: No Abnormality No Abnormality N/A Tenderness on Yes Yes N/A Palpation: Wound Preparation: Ulcer Cleansing: Ulcer Cleansing: N/A Rinsed/Irrigated with Rinsed/Irrigated with Saline Saline Topical Anesthetic Topical Anesthetic Applied: Other: lidocaine Applied: Other: lidocaine 4% Treatment Notes Electronic Signature(s) Signed: 12/04/2015 5:07:36 PM By: Alejandro Mulling Entered By: Alejandro Mulling on 12/04/2015  10:47:34 [name], Richard AMarland Kitchen ([id number]) -------------------------------------------------------------------------------- Multi-Disciplinary Care Plan Details Patient Name: Richard Kyle A. Date of Service: 12/04/2015 9:30 AM Medical Record Number: [id number] Patient Account Number: [id number] Date of Birth/Sex: Oct 08, 1966 (50 y.o. Male) Treating RN: Phillis Haggis Primary Care Physician: Angus Palms Other Clinician: Referring Physician: Treating Physician/Extender: Rudene Re in Treatment: 0 Active Inactive Electronic Signature(s) Signed: 12/05/2015 5:35:56 PM By: Alejandro Mulling Previous Signature: 12/04/2015 5:07:36 PM Version By: Alejandro Mulling Entered By: Alejandro Mulling on 12/05/2015 11:53:04 [name], [name] ([id number]) -------------------------------------------------------------------------------- Pain Assessment Details Patient Name: Richard Kyle A. Date of Service: 12/04/2015 9:30 AM Medical Record Number: [id number] Patient Account Number: [id number] Date of Birth/Sex: January 12, 1966 (50 y.o. Male) Treating RN: Phillis Haggis Primary Care Physician: Angus Palms Other Clinician: Referring Physician: Treating Physician/Extender: Rudene Re in Treatment: 0 Active Problems Location of Pain Severity and Description of Pain Patient Has Paino Yes Site Locations Pain Location: Pain in Ulcers With Dressing Change: Yes Duration of the Pain. Constant / Intermittento Constant Rate the pain. Current Pain Level: 9 Worst Pain Level: 10 Least Pain Level: 5 Character of Pain Describe the Pain: Aching, Throbbing Pain Management and Medication Current Pain Management: Electronic Signature(s) Signed: 12/04/2015 5:07:36 PM By: Alejandro Mulling Entered By: Alejandro Mulling on 12/04/2015 10:05:58 [name], Richard AMarland Kitchen ([id number]) -------------------------------------------------------------------------------- Patient/Caregiver Education  Details Patient Name: Richard Kyle A. Date of Service: 12/04/2015 9:30 AM Medical Record Number: [id number] Patient Account Number: [id number] Date of Birth/Gender: August 18, 1966 (50 y.o. Male) Treating RN: Phillis Haggis Primary Care Physician: Angus Palms Other Clinician: Referring Physician: Treating Physician/Extender: Rudene Re in Treatment: 0 Education Assessment Education Provided To: Patient Education Topics Provided Wound/Skin Impairment: Handouts: Other: make an appt with your surgeon at [facility] Methods: Demonstration, Explain/Verbal Responses: State content correctly Electronic Signature(s) Signed: 12/04/2015 5:07:36 PM By: Alejandro Mulling Entered By: Alejandro Mulling on 12/04/2015 11:19:52 [name], [name] ([id number]) -------------------------------------------------------------------------------- Wound Assessment Details Patient Name: Richard Kyle A. Date of Service: 12/04/2015 9:30 AM Medical Record Number: [id number] Patient Account Number: [id number] Date of Birth/Sex: [birth year] (50 y.o. Male) Treating RN: Ashok Cordia, Debi Primary Care Physician: Angus Palms Other Clinician: Referring Physician: Treating Physician/Extender: Rudene Re in Treatment: 0 Wound Status Wound Number: 1 Primary Pressure Ulcer Etiology: Wound Location: Left Knee - Lateral, Posterior Wound Open Wounding Event: Pressure Injury Status: Date Acquired: 11/17/2014 Comorbid Chronic sinus problems/congestion, Weeks Of Treatment: 0 History: Anemia, Asthma, Sleep Apnea, Coronary Clustered Wound: No Artery Disease, Hypertension, Myocardial Infarction, Type II Diabetes, Gout, Neuropathy Photos Photo Uploaded By: Alejandro Mulling on 12/04/2015 16:42:46 Wound Measurements Length: (cm) 4.2 Width: (cm) 4 Depth: (cm) 0.2 Area: (cm) 13.195 Volume: (cm) 2.639 % Reduction in Area: % Reduction in Volume: Epithelialization: None Tunneling: No Undermining:  No Wound Description Classification: Category/Stage II Foul Odor Af Diabetic Severity (Wagner): Grade 1 Wound Margin: Flat and Intact Exudate Amount: Large Exudate Type: Serosanguineous Exudate Color: red, brown ter Cleansing: No Wound Bed Granulation Amount: Large (67-100%) Exposed Structure [name], Kal A. ([id number]) Granulation Quality: Red, Pink Fascia Exposed: No Necrotic Amount: Small (1-33%) Fat Layer Exposed: No Necrotic Quality: Adherent Slough Tendon Exposed: No Muscle Exposed: No Joint Exposed: No  Bone Exposed: No Limited to Skin Breakdown Periwound Skin Texture Texture Color No Abnormalities Noted: No No Abnormalities Noted: No Localized Edema: Yes Temperature / Pain Moisture Temperature: No Abnormality No Abnormalities Noted: No Tenderness on Palpation: Yes Moist: Yes Wound Preparation Ulcer Cleansing: Rinsed/Irrigated with Saline Topical Anesthetic Applied: Other: lidocaine 4%, Electronic Signature(s) Signed: 12/04/2015 5:07:36 PM By: Alejandro Mulling Entered By: Alejandro Mulling on 12/04/2015 10:42:18 Sweaney, Gianmarco A. ([id number]) -------------------------------------------------------------------------------- Wound Assessment Details Patient Name: Richard Kyle A. Date of Service: 12/04/2015 9:30 AM Medical Record Number: [id number] Patient Account Number: [id number] Date of Birth/Sex: 11-11-1966 (50 y.o. Male) Treating RN: Ashok Cordia, Debi Primary Care Physician: Angus Palms Other Clinician: Referring Physician: Treating Physician/Extender: Rudene Re in Treatment: 0 Wound Status Wound Number: 2 Primary Pressure Ulcer Etiology: Wound Location: Left Knee - Midline, Posterior Wound Open Wounding Event: Pressure Injury Status: Date Acquired: 11/17/2014 Comorbid Chronic sinus problems/congestion, Weeks Of Treatment: 0 History: Anemia, Asthma, Sleep Apnea, Coronary Clustered Wound: No Artery Disease, Hypertension,  Myocardial Infarction, Type II Diabetes, Gout, Neuropathy Photos Photo Uploaded By: Alejandro Mulling on 12/04/2015 16:43:16 Wound Measurements Length: (cm) 2 Width: (cm) 3.7 Depth: (cm) 0.2 Area: (cm) 5.812 Volume: (cm) 1.162 % Reduction in Area: % Reduction in Volume: Epithelialization: None Tunneling: No Undermining: No Wound Description Classification: Category/Stage II Foul Odor Af Diabetic Severity (Wagner): Grade 1 Due to Produ Wound Margin: Flat and Intact Exudate Amount: Large Exudate Type: Serosanguineous Exudate Color: red, brown ter Cleansing: Yes ct Use: No Wound Bed Granulation Amount: Large (67-100%) Exposed Structure Medero, Kelwin A. ([id number]) Granulation Quality: Red, Pink Fascia Exposed: No Necrotic Amount: Small (1-33%) Fat Layer Exposed: No Necrotic Quality: Adherent Slough Tendon Exposed: No Muscle Exposed: No Joint Exposed: No Bone Exposed: No Limited to Skin Breakdown Periwound Skin Texture Texture Color No Abnormalities Noted: No No Abnormalities Noted: No Localized Edema: Yes Temperature / Pain Moisture Temperature: No Abnormality No Abnormalities Noted: No Tenderness on Palpation: Yes Moist: Yes Wound Preparation Ulcer Cleansing: Rinsed/Irrigated with Saline Topical Anesthetic Applied: Other: lidocaine, Electronic Signature(s) Signed: 12/04/2015 5:07:36 PM By: Alejandro Mulling Entered By: Alejandro Mulling on 12/04/2015 10:44:38 Ponciano, Fedor A. ([id number]) -------------------------------------------------------------------------------- Vitals Details Patient Name: Richard Kyle A. Date of Service: 12/04/2015 9:30 AM Medical Record Number: [id number] Patient Account Number: [id number] Date of Birth/Sex: [birth year] (50 y.o. Male) Treating RN: Ashok Cordia, Debi Primary Care Physician: Angus Palms Other Clinician: Referring Physician: Treating Physician/Extender: Rudene Re in Treatment: 0 Vital Signs Time  Taken: 10:06 Temperature (F): 97.8 Height (in): 66 Pulse (bpm): 72 Source: Stated Respiratory Rate (breaths/min): 20 Weight (lbs): 278 Blood Pressure (mmHg): 141/81 Source: Stated Reference Range: 80 - 120 mg / dl Body Mass Index (BMI): 44.9 Electronic Signature(s) Signed: 12/04/2015 5:07:36 PM By: Alejandro Mulling Entered By: Alejandro Mulling on 12/04/2015 10:11:26

## 2015-12-05 NOTE — Progress Notes (Signed)
PEDRAM, GOODCHILD ([id number]) Visit Report for 12/04/2015 Abuse/Suicide Risk Screen Details Patient Name: Richard [name] [name]. Date of Service: 12/04/2015 9:30 AM Medical Record Number: [id number] Patient Account Number: [id number] Date of Birth/Sex: 02-21-1966 (50 y.o. Male) Treating RN: Phillis Haggis Primary Care Physician: Angus Palms Other Clinician: Referring Physician: Treating Physician/Extender: Rudene Re in Treatment: 0 Abuse/Suicide Risk Screen Items Answer ABUSE/SUICIDE RISK SCREEN: Has anyone close to you tried to hurt or harm you recentlyo No Do you feel uncomfortable with anyone in your familyo No Has anyone forced you do things that you didnot want to doo No Do you have any thoughts of harming yourselfo No Patient displays signs or symptoms of abuse and/or neglect. No Electronic Signature(s) Signed: 12/04/2015 5:07:36 PM By: Alejandro Mulling Entered By: Alejandro Mulling on 12/04/2015 10:30:20 [name] [name]. ([id number]) -------------------------------------------------------------------------------- Activities of Daily Living Details Patient Name: [name]. Date of Service: 12/04/2015 9:30 AM Medical Record Number: [id number] Patient Account Number: [id number] Date of Birth/Sex: 1966/08/01 (50 y.o. Male) Treating RN: Phillis Haggis Primary Care Physician: Angus Palms Other Clinician: Referring Physician: Treating Physician/Extender: Rudene Re in Treatment: 0 Activities of Daily Living Items Answer Activities of Daily Living (Please select one for each item) Drive Automobile Completely Able Take Medications Completely Able Use Telephone Completely Able Care for Appearance Completely Able Use Toilet Completely Able Bath / Shower Completely Able Dress Self Completely Able Feed Self Completely Able Walk Completely Able Get In / Out Bed Completely Able Housework Completely Able Prepare Meals Completely Able Handle Money  Completely Able Shop for Self Completely Able Electronic Signature(s) Signed: 12/04/2015 5:07:36 PM By: Alejandro Mulling Entered By: Alejandro Mulling on 12/04/2015 10:31:25 [name] AMarland Kitchen ([id number]) -------------------------------------------------------------------------------- Education Assessment Details Patient Name: [name]. Date of Service: 12/04/2015 9:30 AM Medical Record Number: [id number] Patient Account Number: [id number] Date of Birth/Sex: 1966/10/09 (50 y.o. Male) Treating RN: Phillis Haggis Primary Care Physician: Angus Palms Other Clinician: Referring Physician: Treating Physician/Extender: Rudene Re in Treatment: 0 Primary Learner Assessed: Patient Learning Preferences/Education Level/Primary Language Learning Preference: Explanation, Printed Material Highest Education Level: College or Above Preferred Language: English Cognitive Barrier Assessment/Beliefs Language Barrier: No Translator Needed: No Memory Deficit: No Emotional Barrier: No Cultural/Religious Beliefs Affecting Medical No Care: Physical Barrier Assessment Impaired Vision: Yes Glasses Impaired Hearing: No Decreased Hand dexterity: No Knowledge/Comprehension Assessment Knowledge Level: High Comprehension Level: High Ability to understand written High instructions: Ability to understand verbal High instructions: Motivation Assessment Anxiety Level: Calm Cooperation: Cooperative Education Importance: Acknowledges Need Interest in Health Problems: Asks Questions Perception: Coherent Willingness to Engage in Self- High Management Activities: Readiness to Engage in Self- High Management Activities: Electronic Signature(s) Richard [name] [name] ([id number]) Signed: 12/04/2015 5:07:36 PM By: Alejandro Mulling Entered By: Alejandro Mulling on 12/04/2015 10:32:02 [name] AMarland Kitchen  ([id number]) -------------------------------------------------------------------------------- Fall Risk Assessment Details Patient Name: [name]. Date of Service: 12/04/2015 9:30 AM Medical Record Number: [id number] Patient Account Number: [id number] Date of Birth/Sex: 01-24-1966 (50 y.o. Male) Treating RN: Ashok Cordia, Debi Primary Care Physician: Angus Palms Other Clinician: Referring Physician: Treating Physician/Extender: Rudene Re in Treatment: 0 Fall Risk Assessment Items Have you had 2 or more falls in the last 12 monthso 0 No Have you had any fall that resulted in injury in the last 12 monthso 0 No FALL RISK ASSESSMENT: History of falling - immediate or within 3 months 0 No Secondary diagnosis 15 Yes Ambulatory aid None/bed rest/wheelchair/nurse 0 No Crutches/cane/walker 0 No Furniture 0 No IV Access/Saline Lock 0  No Gait/Training Normal/bed rest/immobile 0 No Weak 0 No Impaired 0 No Mental Status Oriented to own ability 0 No Electronic Signature(s) Signed: 12/04/2015 5:07:36 PM By: Alejandro Mulling Entered By: Alejandro Mulling on 12/04/2015 10:32:19 Richard [name] [name]. ([id number]) -------------------------------------------------------------------------------- Foot Assessment Details Patient Name: [name]. Date of Service: 12/04/2015 9:30 AM Medical Record Number: [id number] Patient Account Number: [id number] Date of Birth/Sex: 10-Jul-1966 (50 y.o. 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 ([id number]) -------------------------------------------------------------------------------- Nutrition Risk Assessment Details Patient Name: [name]. Date of Service: 12/04/2015 9:30 AM Medical Record Number: [id number] Patient Account Number: [id number] Date of Birth/Sex: May 21, 1966 (50 y.o. Male) Treating RN: Ashok Cordia, Debi Primary Care Physician: Angus Palms Other Clinician: Referring Physician: Treating Physician/Extender: Rudene Re in Treatment: 0 Height (in): 66 Weight (lbs): 278 Body Mass Index (BMI): 44.9 Nutrition Risk Assessment Items NUTRITION RISK SCREEN: I have an illness or condition that made me change the kind and/or 0 No amount of food I eat I eat fewer than two meals per day 0 No I eat few fruits and vegetables, or milk products 0 No I have three or more drinks of beer, liquor or wine almost every day 0 No I have tooth or mouth problems that make it hard for me to eat 0 No I don't always have enough money to buy the food I need 0 No I eat alone most of the time 0 No I take three or more different prescribed or over-the-counter drugs [name] 1 Yes day Without wanting to, I have lost or gained 10 pounds in the last six 0 No months I am not always physically able to shop, cook and/or feed myself 0 No Nutrition Protocols Good Risk Protocol Moderate Risk Protocol Electronic Signature(s) Signed: 12/04/2015 5:07:36 PM By: Alejandro Mulling Entered By: Alejandro Mulling on 12/04/2015 10:32:56

## 2015-12-06 ENCOUNTER — Ambulatory Visit: Payer: Self-pay | Admitting: Surgery

## 2015-12-13 ENCOUNTER — Ambulatory Visit: Payer: Medicare HMO | Admitting: Podiatry

## 2015-12-18 ENCOUNTER — Encounter: Payer: Medicare HMO | Attending: Family Medicine | Admitting: *Deleted

## 2015-12-18 ENCOUNTER — Encounter: Payer: Self-pay | Admitting: *Deleted

## 2015-12-18 VITALS — BP 138/84 | Ht 66.0 in | Wt 294.2 lb

## 2015-12-18 DIAGNOSIS — I429 Cardiomyopathy, unspecified: Secondary | ICD-10-CM | POA: Insufficient documentation

## 2015-12-18 DIAGNOSIS — N289 Disorder of kidney and ureter, unspecified: Secondary | ICD-10-CM | POA: Diagnosis not present

## 2015-12-18 DIAGNOSIS — Z794 Long term (current) use of insulin: Secondary | ICD-10-CM | POA: Insufficient documentation

## 2015-12-18 DIAGNOSIS — E039 Hypothyroidism, unspecified: Secondary | ICD-10-CM | POA: Insufficient documentation

## 2015-12-18 DIAGNOSIS — I252 Old myocardial infarction: Secondary | ICD-10-CM | POA: Diagnosis not present

## 2015-12-18 DIAGNOSIS — Z9861 Coronary angioplasty status: Secondary | ICD-10-CM | POA: Insufficient documentation

## 2015-12-18 DIAGNOSIS — G4730 Sleep apnea, unspecified: Secondary | ICD-10-CM | POA: Insufficient documentation

## 2015-12-18 DIAGNOSIS — Z79899 Other long term (current) drug therapy: Secondary | ICD-10-CM | POA: Insufficient documentation

## 2015-12-18 DIAGNOSIS — Z6841 Body Mass Index (BMI) 40.0 and over, adult: Secondary | ICD-10-CM | POA: Insufficient documentation

## 2015-12-18 DIAGNOSIS — E1159 Type 2 diabetes mellitus with other circulatory complications: Secondary | ICD-10-CM

## 2015-12-18 DIAGNOSIS — I739 Peripheral vascular disease, unspecified: Secondary | ICD-10-CM | POA: Diagnosis not present

## 2015-12-18 DIAGNOSIS — I10 Essential (primary) hypertension: Secondary | ICD-10-CM | POA: Diagnosis not present

## 2015-12-18 DIAGNOSIS — F329 Major depressive disorder, single episode, unspecified: Secondary | ICD-10-CM | POA: Diagnosis not present

## 2015-12-18 DIAGNOSIS — J45909 Unspecified asthma, uncomplicated: Secondary | ICD-10-CM | POA: Insufficient documentation

## 2015-12-18 DIAGNOSIS — G629 Polyneuropathy, unspecified: Secondary | ICD-10-CM | POA: Insufficient documentation

## 2015-12-18 DIAGNOSIS — M70852 Other soft tissue disorders related to use, overuse and pressure, left thigh: Secondary | ICD-10-CM | POA: Insufficient documentation

## 2015-12-18 DIAGNOSIS — M109 Gout, unspecified: Secondary | ICD-10-CM | POA: Insufficient documentation

## 2015-12-18 DIAGNOSIS — I2510 Atherosclerotic heart disease of native coronary artery without angina pectoris: Secondary | ICD-10-CM | POA: Insufficient documentation

## 2015-12-18 DIAGNOSIS — L97122 Non-pressure chronic ulcer of left thigh with fat layer exposed: Secondary | ICD-10-CM | POA: Insufficient documentation

## 2015-12-18 DIAGNOSIS — E1165 Type 2 diabetes mellitus with hyperglycemia: Secondary | ICD-10-CM | POA: Diagnosis not present

## 2015-12-18 DIAGNOSIS — Z89512 Acquired absence of left leg below knee: Secondary | ICD-10-CM | POA: Insufficient documentation

## 2015-12-18 DIAGNOSIS — E11622 Type 2 diabetes mellitus with other skin ulcer: Secondary | ICD-10-CM | POA: Insufficient documentation

## 2015-12-18 DIAGNOSIS — Z930 Tracheostomy status: Secondary | ICD-10-CM | POA: Diagnosis not present

## 2015-12-18 NOTE — Progress Notes (Signed)
Diabetes Self-Management Education  Visit Type: First/Initial  Appt. Start Time: 0840 Appt. End Time: 1015  12/18/2015  [name], identified by name and date of birth, is a 50 y.o. male with a diagnosis of Diabetes: Type 2.   ASSESSMENT  Blood pressure 138/84, height  (1.676 m), weight 294 lb 3.2 oz (133.448 kg). Body mass index is 47.51 kg/(m^2).      Diabetes Self-Management Education - 12/18/15 1138    Visit Information   Visit Type First/Initial   Initial Visit   Diabetes Type Type 2   Are you currently following a meal plan? Yes   What type of meal plan do you follow? low salt   Are you taking your medications as prescribed? Yes  Pt doesn't always eat 3 meals/day and doesn't take NovoLog if he doesn't eat.    Date Diagnosed 30 years ago   Health Coping   How would you rate your overall health? Fair   Psychosocial Assessment   Patient Belief/Attitude about Diabetes Motivated to manage diabetes   Self-care barriers None   Self-management support Doctor's office;Family   Patient Concerns Nutrition/Meal planning;Medication;Glycemic Control;Weight Control;Healthy Lifestyle   Special Needs None   Preferred Learning Style Hands on;Auditory   Learning Readiness Contemplating   How often do you need to have someone help you when you read instructions, pamphlets, or other written materials from your doctor or pharmacy? 1 - Never   What is the last grade level you completed in school? 12th   Complications   Last HgB A1C per patient/outside source 9.3 %  09/04/15   How often do you check your blood sugar? 3-4 times/day   Fasting Blood glucose range (mg/dL) --  Pt reports BG's before meals but doesn't have 6-8 hours between eating to check fastings. He reports BG before meals 132-151 mg/dL.    Number of hypoglycemic episodes per month 1  BG of 63 mg/dL   Can you tell when your blood sugar is low? Yes   What do you do if your blood sugar is low? glucose tabs then  et   Have you had a dilated eye exam in the past 12 months? Yes   Have you had a dental exam in the past 12 months? No  appt 12/20/15   Are you checking your feet? Yes   How many days per week are you checking your feet? 7   Dietary Intake   Breakfast 4:00 am - meat, starch and vegetable   Lunch skips or eats at 8:00 am or 2-4:00 pm when he wakes up - chicken or tuna or chicken salad sandwich   Dinner 9-10:00 pm - sandwich, burger   Beverage(s) water, diet soda, G-2, diet juice   Exercise   Exercise Type ADL's   Patient Education   Previous Diabetes Education Yes (please comment)  Pt has been to diabetes classes at [facility]   Disease state  Definition of diabetes, type 1 and 2, and the diagnosis of diabetes   Nutrition management  Role of diet in the treatment of diabetes and the relationship between the three main macronutrients and blood glucose level;Meal timing in regards to the patients' current diabetes medication.   Physical activity and exercise  Role of exercise on diabetes management, blood pressure control and cardiac health.   Medications Taught/reviewed insulin injection, site rotation, insulin storage and needle disposal.;Reviewed patients medication for diabetes, action, purpose, timing of dose and side effects.   Monitoring Identified appropriate SMBG and/or  A1C goals.   Acute complications Taught treatment of hypoglycemia - the 15 rule.   Chronic complications Relationship between chronic complications and blood glucose control   Psychosocial adjustment Role of stress on diabetes  Pt's wife is currently hospitalized.   Individualized Goals (developed by patient)   Reducing Risk Improve blood sugars Decrease medications Lose weight Lead a healthier lifestyle Become more fit   Outcomes   Expected Outcomes Demonstrated limited interest in learning.  Expect minimal changes      Individualized Plan for Diabetes Self-Management Training:   Learning Objective:  Patient will  have a greater understanding of diabetes self-management. Patient education plan is to attend individual and/or group sessions per assessed needs and concerns.   Plan:   Patient Instructions  Check blood sugars 4 x day before each meal and before bed every day Exercise:  Walk as tolerated Eat 3 meals day,  1-2  snacks a day Space meals 4-6 hours apart Complete 3 Day Food Record and bring to next appt Bring blood sugar records to the next appointment Carry fast acting glucose and a snack at all times Return for appointment on: Wednesday January 16, 2016 at 8:15 am with Jill Side (Dietitian)  Expected Outcomes:  Demonstrated limited interest in learning.  Expect minimal changes  Education material provided:  General Meal Planning Guidelines Simple Meal Plan 3 Day Food Record Symptoms, causes and treatments of Hypoglycemia  If problems or questions, patient to contact team via:  Sharion Settler, RN, CCM, CDE [phone]  Future DSME appointment:   Wednesday January 16, 2016 with dietitian

## 2015-12-18 NOTE — Patient Instructions (Addendum)
Check blood sugars 4 x day before each meal and before bed every day Exercise:  Walk as tolerated Eat 3 meals day,  1-2  snacks a day Space meals 4-6 hours apart Complete 3 Day Food Record and bring to next appt Bring blood sugar records to the next appointment Carry fast acting glucose and a snack at all times Return for appointment on: Wednesday January 16, 2016 at 8:15 am with Jill Side (Dietitian)

## 2015-12-25 DIAGNOSIS — E1165 Type 2 diabetes mellitus with hyperglycemia: Secondary | ICD-10-CM | POA: Diagnosis not present

## 2015-12-25 DIAGNOSIS — Z6841 Body Mass Index (BMI) 40.0 and over, adult: Secondary | ICD-10-CM | POA: Diagnosis not present

## 2016-01-14 DIAGNOSIS — Z89619 Acquired absence of unspecified leg above knee: Secondary | ICD-10-CM | POA: Diagnosis not present

## 2016-01-16 ENCOUNTER — Ambulatory Visit: Payer: Medicare HMO | Admitting: Dietician

## 2016-01-17 DIAGNOSIS — Z794 Long term (current) use of insulin: Secondary | ICD-10-CM | POA: Diagnosis not present

## 2016-01-17 DIAGNOSIS — H401133 Primary open-angle glaucoma, bilateral, severe stage: Secondary | ICD-10-CM | POA: Diagnosis not present

## 2016-01-17 DIAGNOSIS — H524 Presbyopia: Secondary | ICD-10-CM | POA: Diagnosis not present

## 2016-02-01 ENCOUNTER — Ambulatory Visit (INDEPENDENT_AMBULATORY_CARE_PROVIDER_SITE_OTHER): Payer: PPO | Admitting: Podiatry

## 2016-02-01 ENCOUNTER — Ambulatory Visit (INDEPENDENT_AMBULATORY_CARE_PROVIDER_SITE_OTHER): Payer: PPO

## 2016-02-01 ENCOUNTER — Encounter: Payer: Self-pay | Admitting: Podiatry

## 2016-02-01 VITALS — BP 121/61 | HR 84 | Temp 97.4°F | Resp 18

## 2016-02-01 DIAGNOSIS — M14671 Charcot's joint, right ankle and foot: Secondary | ICD-10-CM

## 2016-02-01 DIAGNOSIS — Z89512 Acquired absence of left leg below knee: Secondary | ICD-10-CM

## 2016-02-01 DIAGNOSIS — L97511 Non-pressure chronic ulcer of other part of right foot limited to breakdown of skin: Secondary | ICD-10-CM

## 2016-02-01 DIAGNOSIS — L89891 Pressure ulcer of other site, stage 1: Secondary | ICD-10-CM | POA: Diagnosis not present

## 2016-02-01 DIAGNOSIS — R52 Pain, unspecified: Secondary | ICD-10-CM | POA: Diagnosis not present

## 2016-02-01 MED ORDER — MUPIROCIN 2 % EX OINT: 1.0000 "application " | TOPICAL_OINTMENT | Freq: Two times a day (BID) | CUTANEOUS | Status: AC

## 2016-02-01 MED ORDER — CEPHALEXIN 500 MG PO CAPS: 500.0000 mg | ORAL_CAPSULE | Freq: Three times a day (TID) | ORAL | Status: DC

## 2016-02-01 NOTE — Progress Notes (Signed)
Patient ID: [name], male   DOB: [birth year], 50 y.o.   MRN: [id number]  Subjective: 50 year old male presents the office today for concerns of wound on his right foot which didn't present for the last couple weeks. He states his been a lot of walking and standing at work. He also loses diabetic shoes and inserts have worn out and not providing the support that he needs. Denies a drainage or pus. Denies any swelling to his foot. Denies any redness or red streaks. No previous treatment. Denies any systemic complaints such as fevers, chills, nausea, vomiting. No acute changes since last appointment, and no other complaints at this time.   Objective: AAO x3, NAD DP/PT pulses palpable bilaterally, CRT less than 3 seconds Protective sensation decreased with Simms Weinstein monofilament On the plantar aspect of the right midfoot is a wound with hyperkeratotic periwound. After debridement today the wound measures 1 x 0.6 x 0.2 cm with a granular wound base. There is no probing, undermining or tunneling. There is no surrounding erythema, ascending cellulitis, fluctuance, crepitus, malodor, drainage or pus. There does appear to be a slight increase in swelling to the right foot and a mild increase in warmth. His chronic changes the right foot consistent with Charcot. No areas of pinpoint bony tenderness or pain with vibratory sensation. MMT 5/5, ROM WNL. No edema, erythema, increase in warmth to bilateral lower extremities.  No open lesions or pre-ulcerative lesions.  No pain with calf compression, swelling, warmth, erythema  Assessment: 50 year old male right plantar midfoot wound with history of Charcot. There is mild swelling and warmth which may be early infection to the wound. Possible acute Charcot but more likely infection early.  Plan: -All treatment options discussed with the patient including all alternatives, risks, complications.  -X-rays were obtained and reviewed with the patient.  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 -Wound was debrided without complications or bleeding. In about equivalent was applied followed by dressing. Prescribed mupirocin. Offloading pads were dispensed. Monitor closely for any further skin breakdown. I recommended an offloading shoe however due to work he states he cannot wear this and would not wear it. -Rx Keflex -Diabetic she paperwork completed for precertification today. -Monitor for any clinical signs or symptoms of infection and directed to call the office immediately should any occur or go to the ER. -Patient encouraged to call the office with any questions, concerns, change in symptoms.   Ovid CurdMatthew Wagoner, DPM

## 2016-02-01 NOTE — Patient Instructions (Signed)
Monitor for any signs/symptoms of infection. Call the office immediately if any occur or go directly to the emergency room. Call with any questions/concerns.  

## 2016-02-08 ENCOUNTER — Encounter: Payer: Self-pay | Admitting: Dietician

## 2016-02-11 DIAGNOSIS — G4733 Obstructive sleep apnea (adult) (pediatric): Secondary | ICD-10-CM | POA: Diagnosis not present

## 2016-02-12 DIAGNOSIS — Z89619 Acquired absence of unspecified leg above knee: Secondary | ICD-10-CM | POA: Diagnosis not present

## 2016-02-13 DIAGNOSIS — Z89619 Acquired absence of unspecified leg above knee: Secondary | ICD-10-CM | POA: Diagnosis not present

## 2016-02-14 ENCOUNTER — Ambulatory Visit: Payer: PPO | Admitting: Podiatry

## 2016-02-21 ENCOUNTER — Encounter: Payer: Self-pay | Admitting: Dietician

## 2016-02-22 DIAGNOSIS — Z89619 Acquired absence of unspecified leg above knee: Secondary | ICD-10-CM | POA: Diagnosis not present

## 2016-03-03 ENCOUNTER — Telehealth: Payer: Self-pay | Admitting: Podiatry

## 2016-03-03 DIAGNOSIS — I2510 Atherosclerotic heart disease of native coronary artery without angina pectoris: Secondary | ICD-10-CM | POA: Diagnosis not present

## 2016-03-03 DIAGNOSIS — I10 Essential (primary) hypertension: Secondary | ICD-10-CM | POA: Diagnosis not present

## 2016-03-03 DIAGNOSIS — R0609 Other forms of dyspnea: Secondary | ICD-10-CM | POA: Diagnosis not present

## 2016-03-03 DIAGNOSIS — N184 Chronic kidney disease, stage 4 (severe): Secondary | ICD-10-CM | POA: Diagnosis not present

## 2016-03-03 DIAGNOSIS — E1165 Type 2 diabetes mellitus with hyperglycemia: Secondary | ICD-10-CM | POA: Diagnosis not present

## 2016-03-03 DIAGNOSIS — G4733 Obstructive sleep apnea (adult) (pediatric): Secondary | ICD-10-CM | POA: Diagnosis not present

## 2016-03-03 DIAGNOSIS — I255 Ischemic cardiomyopathy: Secondary | ICD-10-CM | POA: Diagnosis not present

## 2016-03-03 NOTE — Telephone Encounter (Signed)
Patient called and wants to know the status of the ordering of his diabetic shoes ( dr. Ardelle AntonWagoner told the patient that he would do all of the paperwork for him. Wants to know status??

## 2016-03-05 NOTE — Telephone Encounter (Signed)
Called patient back and left a message on his voice mail stating that we sent the paperwork over on 03/03/16 to patient's diabetic doctor and once that is signed off by the diabetic doctor we would give patient a call to come in and get measured. Misty StanleyLisa

## 2016-03-06 ENCOUNTER — Encounter: Payer: PPO | Attending: Surgery | Admitting: Surgery

## 2016-03-06 DIAGNOSIS — M70852 Other soft tissue disorders related to use, overuse and pressure, left thigh: Secondary | ICD-10-CM | POA: Diagnosis not present

## 2016-03-06 DIAGNOSIS — I10 Essential (primary) hypertension: Secondary | ICD-10-CM | POA: Diagnosis not present

## 2016-03-06 DIAGNOSIS — L97122 Non-pressure chronic ulcer of left thigh with fat layer exposed: Secondary | ICD-10-CM | POA: Diagnosis not present

## 2016-03-06 DIAGNOSIS — I2510 Atherosclerotic heart disease of native coronary artery without angina pectoris: Secondary | ICD-10-CM | POA: Diagnosis not present

## 2016-03-06 DIAGNOSIS — E039 Hypothyroidism, unspecified: Secondary | ICD-10-CM | POA: Insufficient documentation

## 2016-03-06 DIAGNOSIS — D649 Anemia, unspecified: Secondary | ICD-10-CM | POA: Diagnosis not present

## 2016-03-06 DIAGNOSIS — Z7951 Long term (current) use of inhaled steroids: Secondary | ICD-10-CM | POA: Insufficient documentation

## 2016-03-06 DIAGNOSIS — E1121 Type 2 diabetes mellitus with diabetic nephropathy: Secondary | ICD-10-CM | POA: Insufficient documentation

## 2016-03-06 DIAGNOSIS — G4730 Sleep apnea, unspecified: Secondary | ICD-10-CM | POA: Insufficient documentation

## 2016-03-06 DIAGNOSIS — Z6841 Body Mass Index (BMI) 40.0 and over, adult: Secondary | ICD-10-CM | POA: Insufficient documentation

## 2016-03-06 DIAGNOSIS — Z89512 Acquired absence of left leg below knee: Secondary | ICD-10-CM | POA: Insufficient documentation

## 2016-03-06 DIAGNOSIS — Z794 Long term (current) use of insulin: Secondary | ICD-10-CM | POA: Diagnosis not present

## 2016-03-06 DIAGNOSIS — Z7982 Long term (current) use of aspirin: Secondary | ICD-10-CM | POA: Insufficient documentation

## 2016-03-06 DIAGNOSIS — E1142 Type 2 diabetes mellitus with diabetic polyneuropathy: Secondary | ICD-10-CM | POA: Diagnosis not present

## 2016-03-06 DIAGNOSIS — Z79899 Other long term (current) drug therapy: Secondary | ICD-10-CM | POA: Insufficient documentation

## 2016-03-06 DIAGNOSIS — I252 Old myocardial infarction: Secondary | ICD-10-CM | POA: Insufficient documentation

## 2016-03-06 DIAGNOSIS — E11622 Type 2 diabetes mellitus with other skin ulcer: Secondary | ICD-10-CM | POA: Diagnosis not present

## 2016-03-06 DIAGNOSIS — I255 Ischemic cardiomyopathy: Secondary | ICD-10-CM | POA: Diagnosis not present

## 2016-03-06 DIAGNOSIS — J45909 Unspecified asthma, uncomplicated: Secondary | ICD-10-CM | POA: Insufficient documentation

## 2016-03-06 DIAGNOSIS — I739 Peripheral vascular disease, unspecified: Secondary | ICD-10-CM | POA: Insufficient documentation

## 2016-03-06 DIAGNOSIS — M109 Gout, unspecified: Secondary | ICD-10-CM | POA: Diagnosis not present

## 2016-03-06 DIAGNOSIS — L97821 Non-pressure chronic ulcer of other part of left lower leg limited to breakdown of skin: Secondary | ICD-10-CM | POA: Diagnosis not present

## 2016-03-07 DIAGNOSIS — L97919 Non-pressure chronic ulcer of unspecified part of right lower leg with unspecified severity: Secondary | ICD-10-CM | POA: Diagnosis not present

## 2016-03-07 NOTE — Progress Notes (Signed)
[name], [name] ([id number]) Visit Report for 03/06/2016 Chief Complaint Document Details Patient Name: [name], [name] Date of Service: 03/06/2016 8:45 AM Medical Record Number: [id number] Patient Account Number: [id number] Date of Birth/Sex: [birth year] (50 y.o. Male) Treating RN: Afful, RN, BSN, [place] Sink Primary Care Physician: [name] Other Clinician: Referring Physician: Angus [name] Treating Physician/Extender: [name] in Treatment: 0 Information Obtained from: Patient Chief Complaint Patients presents for treatment of an open diabetic ulcer and pressure injury to his left lower thigh posteriorly for about a year. he is back after her recent consultation at [facility] outpatient [facility] center Electronic Signature(s) Signed: 03/06/2016 1:19:10 PM By: Richard Kanner MD, FACS Entered By: [name] on 03/06/2016 13:19:10 Huxtable, [name] ([id number]) -------------------------------------------------------------------------------- HPI Details Patient Name: Richard Kyle A. Date of Service: 03/06/2016 8:45 AM Medical Record Number: [id number] Patient Account Number: [id number] Date of Birth/Sex: 10/01/1966 (50 y.o. Male) Treating RN: Afful, RN, BSN, [place] Sink Primary Care Physician: [name] Other Clinician: Referring Physician: Angus [name] Treating Physician/Extender: [name] in Treatment: 0 History of Present Illness Location: left lower thigh posteriorly Quality: Patient reports experiencing a dull pain to affected area(s). Severity: Patient states wound are getting worse. 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 HPI Description: 50 year old gentleman with past medical history of diabetes mellitus with last hemoglobin A1c done in February 2016 was 8.7, hypertension, hypothyroidism, coronary artery disease, ischemic cardiomyopathy , nephropathy, obesity, peripheral neuropathy, peripheral vascular disease and sleep apnea. 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 He has had problems with his left BKA prosthesis for a long while and had a vacuum system and then had to be changed over to a sleeve system and has been working with the orthopedic and prosthetic department at [facility]. He has also had a plastic surgery opinion in the past. The ulceration on the posterior part of his lower thigh continued to be aggravating an enlarging and have a foul order and drainage. He works as a Financial risk analyst at [employer] and has to wear his prostheses and standing for long periods of time as he is the only finding member in the family 03/06/2016 -- the notes from the [facility] and was seen by Ms. Lubertha Basque the PA whonoted that he continue to wear his prosthesis against medical advice. details from his past surgical history notes that he's had a cardiac catheterization, coronary angioplasty, amputation of the left leg in 2012, debridement of skin and subcutis tissue in June 2013, application of wound VAC at that time, below knee amputation revision in April 2014. Recommendation was for a new socket to be made which should be above the new wound. He also recommended surgical revision of the redundant skin and to stay off the prosthesis to the wound heals but the patient would not be able to do this because of work. Hence he was advised to establish with a local wound clinic  for management of his current wound and follow-up with the amputee  clinic at [facility] or with Dr. Mindi Curling. If he decided to go back to them for surgery he should see Dr. Lanier Ensign Electronic Signature(s) Signed: 03/06/2016 1:24:51 PM By: Richard Kanner MD, FACS Entered By: [name] on 03/06/2016 13:24:51 [name], [name] ([id number]) [name] Kitchen ([id number]) -------------------------------------------------------------------------------- Physical Exam Details Patient Name: Richard Kyle A. Date of Service: 03/06/2016 8:45 AM Medical Record Number: [id number] Patient Account Number: [id number] Date of Birth/Sex: 1965-12-18 (50 y.o. Male) Treating RN: Afful, RN, BSN, [place] Sink Primary Care Physician: [name] Other Clinician: Referring Physician: Angus [name] Treating Physician/Extender: [name] in Treatment: 0 Constitutional . Pulse regular. Respirations normal and unlabored. Afebrile. . Eyes Nonicteric. Reactive to light. Ears, Nose, Mouth, and Throat Lips, teeth, and gums WNL.Marland Kitchen Moist mucosa without lesions. Neck supple and nontender. No palpable supraclavicular or cervical adenopathy. Normal sized without goiter. Respiratory WNL. No retractions.. Cardiovascular Pedal Pulses WNL. No clubbing, cyanosis or edema. Chest Breasts symmetical and no nipple discharge.. Breast tissue WNL, no masses, lumps, or tenderness.. Lymphatic No adneopathy. No adenopathy. No adenopathy. Musculoskeletal Adexa without tenderness or enlargement.. Digits and nails w/o clubbing, cyanosis, infection, petechiae, ischemia, or inflammatory conditions.. Integumentary (Hair, Skin) No suspicious lesions. No crepitus or fluctuance. No peri-wound warmth or erythema. No masses.Marland Kitchen Psychiatric Judgement and insight Intact.. No evidence of depression, anxiety, or agitation.. Notes he has several ulcerated areas on the left posterior thigh approximately 5 cm above the knee joint. These areas  are down to the subcutaneous tissue have no active infection or drainage and washing it out with saline and gauze reveals that his healthy tissue and no need of debridement. Electronic Signature(s) Signed: 03/06/2016 1:25:54 PM By: Richard Kanner MD, FACS Entered By: [name] on 03/06/2016 13:25:53 Delahunt, Jens Som ([id number]) -------------------------------------------------------------------------------- Physician Orders Details Patient Name: Richard Kyle A. Date of Service: 03/06/2016 8:45 AM Medical Record Number: [id number] Patient Account Number: [id number] Date of Birth/Sex: 11/16/1966 (50 y.o. Male) Treating RN: Afful, RN, BSN, [place] Sink Primary Care Physician: [name] Other Clinician: Referring Physician: Angus [name] Treating Physician/Extender: [name] in Treatment: 0 Verbal / Phone Orders: Yes Clinician: Afful, RN, BSN, Rita Read Back and Verified: Yes Diagnosis Coding Wound Cleansing Wound #3 Posterior Amputation Site - Below Knee o Cleanse wound with mild soap and water o May Shower, gently pat wound dry prior to applying new dressing. o May shower with protection. Anesthetic Wound #3 Posterior Amputation Site - Below Knee o Topical Lidocaine 4% cream applied to wound bed prior to debridement Skin Barriers/Peri-Wound Care Wound #3 Posterior Amputation Site - Below Knee o Barrier cream Primary Wound Dressing Wound #3 Posterior Amputation Site - Below Knee o Aquacel Ag Secondary Dressing o Gauze and Kerlix/Conform Dressing Change Frequency Wound #3 Posterior Amputation Site - Below Knee o Change dressing every day. Follow-up Appointments Wound #3 Posterior Amputation Site - Below Knee o Return Appointment in 1 week. Additional Orders / Instructions Wound #3 Posterior Amputation Site - Below Knee o Increase protein intake. o OK to return to work o OK to return to work with the following restrictions: o  Activity as tolerated MACLOVIO, HENSON ([id number]) o Other: - Vitamin A, C, ZInc, MVI Electronic Signature(s) Signed: 03/06/2016 4:14:45 PM By: Richard Kanner MD, FACS Signed: 03/06/2016 4:25:47 PM By: Elpidio Eric BSN, RN Entered By: Elpidio Eric on 03/06/2016 09:40:42 Giorgio, Gerhart [name] Kitchen ([id number]) -------------------------------------------------------------------------------- Problem List Details Patient Name: Richard Kyle A. Date of Service: 03/06/2016 8:45 AM Medical Record Number: [id number] Patient Account Number: [id number] Date of  Birth/Sex: 08-Apr-1966 (49 y.o. 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 ([id number]) -------------------------------------------------------------------------------- Progress Note Details Patient Name: Richard Kyle A. Date of Service: 03/06/2016 8:45 AM Medical Record Number: [id number] Patient Account Number: [id number] Date of Birth/Sex: 1966-01-07 (50 y.o. Male) Treating RN: Afful, RN, BSN, [place] Sink Primary Care Physician: [name] Other Clinician: Referring Physician: Angus [name] Treating Physician/Extender: [name] in Treatment: 0 Subjective Chief Complaint Information obtained from  Patient Patients presents for treatment of an open diabetic ulcer and pressure injury to his left lower thigh posteriorly for about a year. he is back after her recent consultation at [facility] outpatient prosthetic center History of Present Illness (HPI) The following HPI elements were documented for the patient's wound: Location: left lower thigh posteriorly Quality: Patient reports experiencing a dull pain to affected area(s). Severity: Patient states wound are getting worse. 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 50 year old gentleman with past medical history of diabetes mellitus with last hemoglobin A1c done in February 2016 was 8.7, hypertension, hypothyroidism, coronary artery disease, ischemic cardiomyopathy , nephropathy, obesity, peripheral neuropathy, peripheral vascular disease and sleep apnea. 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 He has had problems with his left BKA prosthesis for a long while and had a vacuum system and then had to be changed over to a sleeve system and has been working with the orthopedic and prosthetic department at [facility]. He has also had a plastic surgery opinion in the past. The ulceration on the posterior part of his lower thigh continued to be aggravating an enlarging and have a foul order and drainage. 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 Lubertha Basque the PA whonoted that he continue to wear his prosthesis against medical advice. details from his past surgical history notes that he's had a cardiac catheterization, coronary angioplasty, amputation of the left leg in 2012, debridement of skin and subcutis tissue in June 2013, application of wound VAC at that time, below knee amputation revision in April 2014. Kettles, Nadir A. ([id number]) Recommendation was for a new socket to be made which should be above the new wound. He also recommended surgical revision of the redundant skin and  to stay off the prosthesis to the wound heals but the patient would not be able to do this because of work. Hence he was advised to establish with a local wound clinic for management of his current wound and follow-up with the amputee clinic at [facility] or with Dr. Mindi Curling. If he decided to go back to them for surgery he should see Dr. Lanier Ensign Wound History Patient presents with 1 open wound that has been present for approximately 1 year. Patient has been treating wound in the following manner: foam. Laboratory tests have not been performed in the last month. Patient reportedly has not tested positive for an antibiotic resistant organism. Patient reportedly has not tested positive for osteomyelitis. Patient reportedly has not had testing performed to evaluate circulation in the legs. Patient experiences the following problems associated with their wounds: infection. Patient History Information obtained from Patient. Allergies amitriptyline (Severity: Severe, Reaction: nausea), Pravachol (Severity: Severe, Reaction: muscles ache and elevated CK), Septra (Severity: Severe, Reaction: mouth ulcers), ACE Inhibitors (Severity: Severe, Reaction: increases creatinine), lovastatin (Severity: Severe, Reaction: myalgia), adhesive  (Severity: Severe, Reaction: rash), Iodinated Contrast Media - Oral and IV Dye (Severity: Severe, Reaction: kidney disorder), Tree Nuts (Severity: Severe, Reaction: anaphalaxis) Family History Hypertension - Mother, No family history of Cancer, Diabetes, Heart Disease, Hereditary Spherocytosis, Kidney Disease, Lung Disease, Seizures, Stroke, Thyroid Problems, Tuberculosis. Social History Never smoker, Marital Status - Married, Alcohol Use - Never, Drug Use - No History, Caffeine Use - Never. Medical History Eyes Denies history of Cataracts, Glaucoma, Optic Neuritis Integumentary (Skin) Patient has history of History of pressure wounds - prosthetic injury Denies history of History of Burn Psychiatric Denies history of Anorexia/bulimia, Confinement Anxiety Medical And Surgical History Notes Cardiovascular heart murmur, hx cardiomyopathy Genitourinary hx of kidney failure and hx of dialysis Psychiatric hx depression Review of Systems (ROS) Tyler, Gabrial A. ([id number]) Eyes The patient has no complaints or symptoms. Hematologic/Lymphatic The patient has no complaints or symptoms. Respiratory The patient has no complaints or symptoms. Cardiovascular The patient has no complaints or symptoms. Genitourinary The patient has no complaints or symptoms. Immunological The patient has no complaints or symptoms. Integumentary (Skin) Complains or has symptoms of Wounds, Breakdown. Neurologic The patient has no complaints or symptoms. Oncologic The patient has no complaints or symptoms. Psychiatric The patient has no complaints or symptoms. Medications carvedilol 6.25 mg tablet oral 1 1 tablet oral every 12 hours gabapentin 300 mg capsule oral 1 1 capsule oral three times daily loratadine 10 mg tablet oral 1 1 tablet oral daily albuterol sulfate HFA 90 mcg/actuation aerosol inhaler inhalation HFA aerosol inhaler inhalation two inhalations every six hours as needed allopurinol  100 mg tablet oral 1 1 tablet oral daily Novolog Flexpen 100 unit/mL subcutaneous subcutaneous insulin pen subcutaneous inject 25 units three times daily with meals Toujeo SoloStar 300 unit/mL (1.5 mL) subcutaneous insulin pen subcutaneous insulin pen subcutaneous two times daily metoclopramide 10 mg tablet oral 1 1 tablet oral three times daily metoclopramide 10 mg tablet oral 1 1 tablet oral three times daily senna 8.6 mg tablet oral 1 1 tablet oral daily as needed sennosides 8.6 mg tablet oral 1 1 tablet oral as directed torsemide 20 mg tablet oral 2 2 tablet oral (40 mg.) one to twice daily as needed magnesium oxide 400 mg tablet oral 1 1 tablet oral daily oxycodone-acetaminophen 5 mg-325 mg tablet oral 1 1 tablet oral four times daily fluticasone 50 mcg/actuation nasal spray,suspension nasal 1 1 spray,suspension nasal into both nostrils two  times daily diclofenac sodium 50 mg tablet,delayed release oral 1 1 tablet,delayed release (DR/EC) oral nightly ibuprofen 200 mg tablet oral 1 1 tablet oral as needed amoxicillin 500 mg capsule oral 1 1 capsule oral as directed aspirin 81 mg tablet,delayed release oral 1 1 tablet,delayed release (DR/EC) oral as directed spironolactone 25 mg tablet oral 1 1 tablet oral daily Zegerid 20 mg-1.1 gram capsule oral 1 1 capsule oral daily Witthuhn, Daley A. ([id number]) doxycycline hyclate 100 mg tablet oral 1 1 tablet oral two times daily metolazone 5 mg tablet oral 1 1 tablet oral only as needed ( limit to less than weekly) levothyroxine 75 mcg tablet oral 1 1 tablet oral daily clobetasol 0.05 % topical ointment topical ointment topical as directed Nitrostat 0.4 mg sublingual tablet sublingual 1 1 tablet, sublingual every five minutes up to three doses Objective Constitutional Pulse regular. Respirations normal and unlabored. Afebrile. Vitals Time Taken: 9:04 AM, Height: 66 in, Source: Stated, Weight: 280 lbs, Source: Stated, BMI: 45.2, Temperature:  98.2 F, Pulse: 77 bpm, Respiratory Rate: 20 breaths/min, Blood Pressure: 121/77 mmHg. Eyes Nonicteric. Reactive to light. Ears, Nose, Mouth, and Throat Lips, teeth, and gums WNL.Marland Kitchen. Moist mucosa without lesions. Neck supple and nontender. No palpable supraclavicular or cervical adenopathy. Normal sized without goiter. Respiratory WNL. No retractions.. Cardiovascular Pedal Pulses WNL. No clubbing, cyanosis or edema. Chest Breasts symmetical and no nipple discharge.. Breast tissue WNL, no masses, lumps, or tenderness.. Lymphatic No adneopathy. No adenopathy. No adenopathy. Musculoskeletal Adexa without tenderness or enlargement.. Digits and nails w/o clubbing, cyanosis, infection, petechiae, ischemia, or inflammatory conditions.Marland Kitchen. Psychiatric Judgement and insight Intact.. No evidence of depression, anxiety, or agitation.. General Notes: he has several ulcerated areas on the left posterior thigh approximately 5 cm above the knee joint. These areas are down to the subcutaneous tissue have no active infection or drainage and Duskin, Nijee A. ([id number]) washing it out with saline and gauze reveals that his healthy tissue and no need of debridement. Integumentary (Hair, Skin) No suspicious lesions. No crepitus or fluctuance. No peri-wound warmth or erythema. No masses.. Wound #3 status is Open. Original cause of wound was Pressure Injury. The wound is located on the Posterior Amputation Site - Below Knee. The wound measures 4cm length x 9cm width x 0.2cm depth; 28.274cm^2 area and 5.655cm^3 volume. The wound is limited to skin breakdown. There is no tunneling or undermining noted. There is a large amount of serosanguineous drainage noted. The wound margin is thickened. There is large (67-100%) pink, pale granulation within the wound bed. There is a small (1-33%) amount of necrotic tissue within the wound bed including Adherent Slough. The periwound skin appearance exhibited: Scarring,  Moist. The periwound skin appearance did not exhibit: Callus, Crepitus, Excoriation, Fluctuance, Friable, Induration, Localized Edema, Rash, Dry/Scaly, Maceration, Atrophie Blanche, Cyanosis, Ecchymosis, Hemosiderin Staining, Mottled, Pallor, Rubor, Erythema. Periwound temperature was noted as No Abnormality. The periwound has tenderness on palpation. Assessment Active Problems ICD-10 E11.622 - Type 2 diabetes mellitus with other skin ulcer L97.122 - Non-pressure chronic ulcer of left thigh with fat layer exposed M70.852 - Other soft tissue disorders related to use, overuse and pressure, left thigh E66.01 - Morbid (severe) obesity due to excess calories Plan Wound Cleansing: Wound #3 Posterior Amputation Site - Below Knee: Cleanse wound with mild soap and water May Shower, gently pat wound dry prior to applying new dressing. May shower with protection. Anesthetic: Wound #3 Posterior Amputation Site - Below Knee: Topical Lidocaine 4% cream applied to wound bed prior  to debridement Skin Barriers/Peri-Wound Care: Wound #3 Posterior Amputation Site - Below Knee: Barrier cream Primary Wound Dressing: Wound #3 Posterior Amputation Site - Below Knee: Aquacel Ag Ellingsen, Lealon A. ([id number]) Secondary Dressing: Gauze and Kerlix/Conform Dressing Change Frequency: Wound #3 Posterior Amputation Site - Below Knee: Change dressing every day. Follow-up Appointments: Wound #3 Posterior Amputation Site - Below Knee: Return Appointment in 1 week. Additional Orders / Instructions: Wound #3 Posterior Amputation Site - Below Knee: Increase protein intake. 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 He has been using local  care as per instructions from [facility] wound care system and has not ever offloaded this area by stopping to use his prosthetic leg. Offloading by using a wheelchair for an extended period of time is not an option. After carefully considering all therapeutic measures I have recommended: 1. Silver alginate and a foam padding to offload as well as possible this area. 2. Since he cannot offload by discontinuing his prosthetic leg the only other option is for surgical solution.the patient has been told by me and by the [facility] prostatic department but he is not willing to have surgery at this time. 3. I have had a thorough discussion with him regarding all the advice he could have about surgical options but the patient is adamant that he wants to try consultative therapy. 4. He has got an appointment this coming week to get a new socket for his prosthesis at the outpatient amputee center 5. he fully understands the various options and agrees to try conservative therapy and understands that at some stage this may get worse and he may end up with a above-knee amputation 6. Return to the wound center for monitoring and support. The patient understands the treatment plan and will be compliant. Electronic Signature(s) Signed: 03/06/2016 1:28:58 PM By: Richard Kanner MD, FACS Entered By: [name] on 03/06/2016 13:28:58 Laguardia, Jens Som ([id number]) Rodick, Jens Som ([id number]) -------------------------------------------------------------------------------- ROS/PFSH Details Patient Name: Richard Kyle A. Date of Service: 03/06/2016 8:45 AM Medical Record Number: [id number] Patient Account Number: [id number] Date of Birth/Sex: 10-13-1966 (49 y.o. Male) Treating RN: Afful, RN, BSN, [place] Sink Primary Care Physician: [name] Other Clinician: Referring Physician: Angus [name] Treating Physician/Extender: [name] in Treatment: 0 Information Obtained From Patient Wound History Do  you currently have one or more open woundso Yes How many open wounds do you currently haveo 1 Approximately how long have you had your woundso 1 year How have you been treating your wound(s) until nowo foam Has your wound(s) ever healed and then [name]-openedo No Have you had any lab work done in the past montho No Have you tested positive for an antibiotic resistant organism (MRSA, VRE)o No Have you tested positive for osteomyelitis (bone infection)o No Have you had any tests for circulation on your legso No Have you had other problems associated with your woundso Infection Integumentary (Skin) Complaints and Symptoms: Positive for: Wounds; Breakdown Medical History: Positive for: History of pressure wounds - prosthetic injury Negative for: History of Burn Eyes Complaints and Symptoms: No Complaints or Symptoms Medical History: Negative for: Cataracts; Glaucoma; Optic Neuritis Ear/Nose/Mouth/Throat Medical History: Positive for: Chronic sinus problems/congestion Negative for: Middle ear problems Hematologic/Lymphatic Complaints and Symptoms:  No Complaints or Symptoms Kampf, Brennden A. 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 ([id number]) Complaints and Symptoms: No Complaints or Symptoms Medical History: Positive for: Neuropathy Oncologic Complaints and Symptoms: No Complaints or Symptoms Psychiatric Complaints and Symptoms: No Complaints or Symptoms Medical History: Negative for: Anorexia/bulimia; Confinement Anxiety Past Medical History Notes: hx depression HBO Extended History Items Ear/Nose/Mouth/Throat: Chronic sinus problems/congestion Family and Social History Cancer: No; Diabetes: No; Heart Disease: No; Hereditary Spherocytosis: No; Hypertension: Yes - Mother; Kidney Disease: No; Lung Disease: No; Seizures: No; Stroke: No; Thyroid Problems: No; Tuberculosis: No; Never smoker; Marital Status - Married; Alcohol Use: Never; Drug Use: No History; Caffeine Use: Never; Financial Concerns: No; Food, Clothing or Shelter Needs: No; Support System Lacking: No; Transportation Concerns: No; Advanced Directives: No; Patient does not want information on Advanced Directives; Do not resuscitate: No; Living Will: No; Medical Power of Attorney: No Physician Affirmation I have reviewed and agree with the above information. Electronic Signature(s) Signed: 03/06/2016 1:10:46 PM By: Richard Kanner MD, FACS Signed: 03/06/2016 4:25:47 PM By: Elpidio Eric BSN, RN Entered By: [name] on 03/06/2016 13:10:45 Morson, Deshon [name] Kitchen ([id number]) -------------------------------------------------------------------------------- SuperBill Details Patient Name: Richard Kyle A. Date of Service: 03/06/2016 Medical Record Number: [id number] Patient Account Number: [id number] Date of Birth/Sex: 04/24/1966 (50 y.o. 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 M70.852 Other soft tissue disorders related to use, overuse and pressure, left thigh E66.01 Morbid (severe) obesity due to excess calories Facility Procedures CPT4 Code: 72536644 Description: 99214 - WOUND CARE VISIT-LEV 4 EST PT Modifier: Quantity: 1 Physician Procedures CPT4 Code Description: [id number] 99214 - WC PHYS LEVEL 4 - EST PT ICD-10 Description Diagnosis E11.622 Type 2 diabetes mellitus with other skin ulcer M70.852 Other soft tissue disorders related to use, overuse L97.122 Non-pressure chronic ulcer of left  thigh with fat l E66.01 Morbid (severe) obesity due to excess calories Modifier: and pressure, ayer exposed Quantity: 1 left thigh Electronic Signature(s) Signed: 03/06/2016 1:29:22 PM By: Richard Kanner MD, FACS Entered By: [name] on 03/06/2016 95:63:87

## 2016-03-07 NOTE — Progress Notes (Signed)
PAYDEN, DOCTER ([id number]) Visit Report for 03/06/2016 Abuse/Suicide Risk Screen Details Patient Name: [name], [name] Date of Service: 03/06/2016 8:45 AM Medical Record Number: [id number] Patient Account Number: [id number] Date of Birth/Sex: January 20, 1966 (50 y.o. Male) Treating RN: Afful, RN, BSN, [place] Sink Primary Care Physician: Angus Palms Other Clinician: Referring Physician: Angus Palms Treating Physician/Extender: [name] in Treatment: 0 Abuse/Suicide Risk Screen Items Answer ABUSE/SUICIDE RISK SCREEN: Has anyone close to you tried to hurt or harm you recentlyo No Do you feel uncomfortable with anyone in your familyo No Has anyone forced you do things that you didnot want to doo No Do you have any thoughts of harming yourselfo No Patient displays signs or symptoms of abuse and/or neglect. No Electronic Signature(s) Signed: 03/06/2016 4:25:47 PM By: Elpidio Eric BSN, RN Entered By: Elpidio Eric on 03/06/2016 08:57:45 [name], [name] Kitchen ([id number]) -------------------------------------------------------------------------------- Activities of Daily Living Details Patient Name: Richard Kyle A. Date of Service: 03/06/2016 8:45 AM Medical Record Number: [id number] Patient Account Number: [id number] Date of Birth/Sex: 19-Apr-1966 (50 y.o. Male) Treating RN: Afful, RN, BSN, [place] Sink Primary Care Physician: Angus Palms Other Clinician: Referring Physician: Angus Palms Treating Physician/Extender: [name] in Treatment: 0 Activities of Daily Living Items Answer Activities of Daily Living (Please select one for each item) Drive Automobile Completely Able Take Medications Completely Able Use Telephone Completely Able Care for Appearance Completely Able Use Toilet Completely Able Bath / Shower Completely Able Dress Self Completely Able Feed Self Completely Able Walk Completely Able Get In / Out Bed Completely Able Housework Completely  Able Prepare Meals Completely Able Handle Money Completely Able Shop for Self Completely Able Electronic Signature(s) Signed: 03/06/2016 4:25:47 PM By: Elpidio Eric BSN, RN Entered By: Elpidio Eric on 03/06/2016 08:58:04 [name], [name] ([id number]) -------------------------------------------------------------------------------- Education Assessment Details Patient Name: Richard Kyle A. Date of Service: 03/06/2016 8:45 AM Medical Record Number: [id number] Patient Account Number: [id number] Date of Birth/Sex: December 20, 1965 (50 y.o. Male) Treating RN: Afful, RN, BSN, [place] Sink Primary Care Physician: Angus Palms Other Clinician: Referring Physician: Angus Palms Treating Physician/Extender: [name] in Treatment: 0 Primary Learner Assessed: Patient Learning Preferences/Education Level/Primary Language Learning Preference: Explanation Highest Education Level: Grade School Preferred Language: English Cognitive Barrier Assessment/Beliefs Language Barrier: No Physical Barrier Assessment Impaired Vision: No Knowledge/Comprehension Assessment Knowledge Level: High Comprehension Level: High Ability to understand written High instructions: Ability to understand verbal High instructions: Motivation Assessment Anxiety Level: Calm Cooperation: Cooperative Education Importance: Acknowledges Need Interest in Health Problems: Asks Questions Perception: Coherent Willingness to Engage in Self- High Management Activities: Readiness to Engage in Self- High Management Activities: Electronic Signature(s) Signed: 03/06/2016 4:25:47 PM By: Elpidio Eric BSN, RN Entered By: Elpidio Eric on 03/06/2016 09:00:06 [name], [name] ([id number]) -------------------------------------------------------------------------------- Fall Risk Assessment Details Patient Name: Richard Kyle A. Date of Service: 03/06/2016 8:45 AM Medical Record Number: [id number] Patient Account Number:  [id number] Date of Birth/Sex: March 15, 1966 (50 y.o. Male) Treating RN: Afful, RN, BSN, [place] Sink Primary Care Physician: Angus Palms Other Clinician: Referring Physician: Angus Palms Treating Physician/Extender: [name] in Treatment: 0 Fall Risk Assessment Items Have you had 2 or more falls in the last 12 monthso 0 No Have you had any fall that resulted in injury in the last 12 monthso 0 No FALL RISK ASSESSMENT: History of falling - immediate or within 3 months 0 No Secondary diagnosis 0 No Ambulatory aid None/bed rest/wheelchair/nurse 0 Yes Crutches/cane/walker 0 No Furniture 0 No IV Access/Saline Lock 0 No Gait/Training Normal/bed rest/immobile 0 No Weak 10  Yes Impaired 20 Yes Mental Status Oriented to own ability 0 Yes Electronic Signature(s) Signed: 03/06/2016 4:25:47 PM By: Elpidio EricAfful, Rita BSN, RN Entered By: Elpidio EricAfful, Rita on 03/06/2016 09:00:28 [name], [name] Kitchen. ([id number]) -------------------------------------------------------------------------------- Foot Assessment Details Patient Name: [name], [name] Date of Service: 03/06/2016 8:45 AM Medical Record Number: [id number] Patient Account Number: [id number] Date of Birth/Sex: [birth year] (50 y.o. Male) Treating RN: Afful, RN, BSN, [place] Sinkita Primary Care Physician: Angus PalmsGEORGE, SIONNE Other Clinician: Referring Physician: Angus PalmsGEORGE, SIONNE Treating Physician/Extender: Richard ReBritto, Errol Weeks in Treatment: 0 Foot Assessment Items Site Locations + = Sensation present, - = Sensation absent, C = Callus, U = Ulcer R = Redness, W = Warmth, M = Maceration, PU = Pre-ulcerative lesion F = Fissure, S = Swelling, D = Dryness Assessment Right: Left: Other Deformity: No No Prior Foot Ulcer: No No Prior Amputation: No No Charcot Joint: No No Ambulatory Status: Ambulatory Without Help Gait: Steady Electronic Signature(s) Signed: 03/06/2016 4:25:47 PM By: Elpidio EricAfful, Rita BSN, RN Entered By: Elpidio EricAfful, Rita on 03/06/2016 09:00:59 [name],  [name] Kitchen. ([id number]) -------------------------------------------------------------------------------- Nutrition Risk Assessment Details Patient Name: [name], [name] A. Date of Service: 03/06/2016 8:45 AM Medical Record Number: [id number] Patient Account Number: [id number] Date of Birth/Sex: [birth year] (50 y.o. Male) Treating RN: Afful, RN, BSN, [place] Sinkita Primary Care Physician: Angus PalmsGEORGE, SIONNE Other Clinician: Referring Physician: Angus PalmsGEORGE, SIONNE Treating Physician/Extender: Richard ReBritto, Errol Weeks in Treatment: 0 Height (in): 66 Weight (lbs): 278 Body Mass Index (BMI): 44.9 Nutrition Risk Assessment Items NUTRITION RISK SCREEN: I have an illness or condition that made me change the kind and/or 0 No amount of food I eat I eat fewer than two meals per day 0 No I eat few fruits and vegetables, or milk products 0 No I have three or more drinks of beer, liquor or wine almost every day 0 No I have tooth or mouth problems that make it hard for me to eat 0 No I don't always have enough money to buy the food I need 0 No I eat alone most of the time 0 No I take three or more different prescribed or over-the-counter drugs a 0 No day Without wanting to, I have lost or gained 10 pounds in the last six 0 No months I am not always physically able to shop, cook and/or feed myself 0 No Nutrition Protocols Good Risk Protocol 0 No interventions needed Moderate Risk Protocol Electronic Signature(s) Signed: 03/06/2016 4:25:47 PM By: Elpidio EricAfful, Rita BSN, RN Entered By: Elpidio EricAfful, Rita on 03/06/2016 09:00:43

## 2016-03-07 NOTE — Progress Notes (Signed)
KRISTION, HOLIFIELD ([id number]) Visit Report for 03/06/2016 Allergy List Details Patient Name: GLENNIE, RODDA A. Date of Service: 03/06/2016 8:45 AM Medical Record Number: [id number] Patient Account Number: [id number] Date of Birth/Sex: Jun 25, 1966 (50 y.o. Male) Treating RN: Afful, RN, BSN, [place] Sink Primary Care Physician: Angus Palms Other Clinician: Referring Physician: Angus Palms Treating Physician/Extender: Rudene Re in Treatment: 0 Allergies Active Allergies amitriptyline Reaction: nausea Severity: Severe Pravachol Reaction: muscles ache and elevated CK Severity: Severe Septra Reaction: mouth ulcers Severity: Severe ACE Inhibitors Reaction: increases creatinine Severity: Severe lovastatin Reaction: myalgia Severity: Severe adhesive Reaction: rash Severity: Severe Iodinated Contrast Media - Oral and IV Dye Reaction: kidney disorder Severity: Severe Tree Nuts Reaction: anaphalaxis Severity: Severe Hartin, Baraa AMarland Kitchen ([id number]) Allergy Notes Electronic Signature(s) Signed: 03/06/2016 4:25:47 PM By: Elpidio Eric BSN, RN Entered By: Elpidio Eric on 03/06/2016 09:03:18 Farrier, Tegan AMarland Kitchen ([id number]) -------------------------------------------------------------------------------- Arrival Information Details Patient Name: [name] Date of Service: 03/06/2016 8:45 AM Medical Record Number: [id number] Patient Account Number: [id number] Date of Birth/Sex: 25-Jul-1966 (50 y.o. Male) Treating RN: Afful, RN, BSN, [place] Sink Primary Care Physician: Angus Palms Other Clinician: Referring Physician: Angus Palms Treating Physician/Extender: Rudene Re in Treatment: 0 Visit Information Patient Arrived: Ambulatory Arrival Time: 08:56 Accompanied By: self Transfer Assistance: None Patient Identification Verified: Yes Secondary Verification Process Yes Completed: Patient Requires Transmission-Based No Precautions: Patient Has Alerts:  No History Since Last Visit Added or deleted any medications: No Any new allergies or adverse reactions: No Had a fall or experienced change in activities of daily living that may affect risk of falls: No Signs or symptoms of abuse/neglect since last visito No Hospitalized since last visit: No Electronic Signature(s) Signed: 03/06/2016 4:25:47 PM By: Elpidio Eric BSN, RN Entered By: Elpidio Eric on 03/06/2016 08:57:09 Celmer, Francesco AMarland Kitchen ([id number]) -------------------------------------------------------------------------------- Clinic Level of Care Assessment Details Patient Name: [name] Date of Service: 03/06/2016 8:45 AM Medical Record Number: [id number] Patient Account Number: [id number] Date of Birth/Sex: 1966-10-21 (50 y.o. Male) Treating RN: Afful, RN, BSN, [place] Sink Primary Care Physician: Angus Palms Other Clinician: Referring Physician: Angus Palms Treating Physician/Extender: Rudene Re in Treatment: 0 Clinic Level of Care Assessment Items TOOL 2 Quantity Score  - Use when only an EandM is performed on the INITIAL visit 0 ASSESSMENTS - Nursing Assessment / Reassessment X - General Physical Exam (combine w/ comprehensive assessment (listed just 1 20 below) when performed on new pt. evals) X - Comprehensive Assessment (HX, ROS, Risk Assessments, Wounds Hx, etc.) 1 25 ASSESSMENTS - Wound and Skin Assessment / Reassessment X - Simple Wound Assessment / Reassessment - one wound 1 5  - Complex Wound Assessment / Reassessment - multiple wounds 0  - Dermatologic / Skin Assessment (not related to wound area) 0 ASSESSMENTS - Ostomy and/or Continence Assessment and Care  - Incontinence Assessment and Management 0  - Ostomy Care Assessment and Management (repouching, etc.) 0 PROCESS - Coordination of Care X - Simple Patient / Family Education for ongoing care 1 15  - Complex (extensive) Patient / Family Education for ongoing care 0 X - Staff  obtains Chiropractor, Records, Test Results / Process Orders 1 10  - Staff telephones HHA, Nursing Homes / Clarify orders / etc 0  - Routine Transfer to another Facility (non-emergent condition) 0  - Routine Hospital Admission (non-emergent condition) 0 X - New Admissions / Manufacturing engineer / Ordering NPWT, Apligraf, etc. 1 15  - Emergency Hospital Admission (emergent condition) 0  - Simple Discharge Coordination 0 Smay,  Jorah A. ([id number]) []  - Complex (extensive) Discharge Coordination 0 PROCESS - Special Needs []  - Pediatric / Minor Patient Management 0 []  - Isolation Patient Management 0 []  - Hearing / Language / Visual special needs 0 []  - Assessment of Community assistance (transportation, D/C planning, etc.) 0 []  - Additional assistance / Altered mentation 0 []  - Support Surface(s) Assessment (bed, cushion, seat, etc.) 0 INTERVENTIONS - Wound Cleansing / Measurement X - Wound Imaging (photographs - any number of wounds) 1 5 []  - Wound Tracing (instead of photographs) 0 X - Simple Wound Measurement - one wound 1 5 []  - Complex Wound Measurement - multiple wounds 0 X - Simple Wound Cleansing - one wound 1 5 []  - Complex Wound Cleansing - multiple wounds 0 INTERVENTIONS - Wound Dressings []  - Small Wound Dressing one or multiple wounds 0 []  - Medium Wound Dressing one or multiple wounds 0 X - Large Wound Dressing one or multiple wounds 1 20 []  - Application of Medications - injection 0 INTERVENTIONS - Miscellaneous []  - External ear exam 0 []  - Specimen Collection (cultures, biopsies, blood, body fluids, etc.) 0 []  - Specimen(s) / Culture(s) sent or taken to Lab for analysis 0 []  - Patient Transfer (multiple staff / Nurse, adult / Similar devices) 0 []  - Simple Staple / Suture removal (25 or less) 0 []  - Complex Staple / Suture removal (26 or more) 0 Renshaw, Adalberto A. ([id number]) []  - Hypo / Hyperglycemic Management (close monitor of Blood Glucose) 0 []  -  Ankle / Brachial Index (ABI) - do not check if billed separately 0 Has the patient been seen at the hospital within the last three years: Yes Total Score: 125 Level Of Care: New/Established - Level 4 Electronic Signature(s) Signed: 03/06/2016 4:25:47 PM By: Elpidio Eric BSN, RN Entered By: Elpidio Eric on 03/06/2016 09:45:45 Abbett, Kinneth AMarland Kitchen ([id number]) -------------------------------------------------------------------------------- Encounter Discharge Information Details Patient Name: [name] Date of Service: 03/06/2016 8:45 AM Medical Record Number: [id number] Patient Account Number: [id number] Date of Birth/Sex: [birth year] (50 y.o. Male) Treating RN: Afful, RN, BSN, [place] Sink Primary Care Physician: Angus Palms Other Clinician: Referring Physician: Angus Palms Treating Physician/Extender: Rudene Re in Treatment: 0 Encounter Discharge Information Items Discharge Pain Level: 0 Discharge Condition: Stable Ambulatory Status: Ambulatory Discharge Destination: Home Transportation: Private Auto Accompanied By: self Schedule Follow-up Appointment: No Medication Reconciliation completed and provided to Patient/Care No Recie Cirrincione: Provided on Clinical Summary of Care: 03/06/2016 Form Type Recipient Paper Patient DP Electronic Signature(s) Signed: 03/06/2016 9:51:24 AM By: Gwenlyn Perking Entered By: Gwenlyn Perking on 03/06/2016 09:51:24 Regas, Brynden A. ([id number]) -------------------------------------------------------------------------------- Lower Extremity Assessment Details Patient Name: [name] Date of Service: 03/06/2016 8:45 AM Medical Record Number: [id number] Patient Account Number: [id number] Date of Birth/Sex: 10-16-1966 (50 y.o. Male) Treating RN: Afful, RN, BSN, [place] Sink Primary Care Physician: Angus Palms Other Clinician: Referring Physician: Angus Palms Treating Physician/Extender: Rudene Re in Treatment: 0 Electronic  Signature(s) Signed: 03/06/2016 4:25:47 PM By: Elpidio Eric BSN, RN Entered By: Elpidio Eric on 03/06/2016 09:03:33 Royse, Marck AMarland Kitchen ([id number]) -------------------------------------------------------------------------------- Multi Wound Chart Details Patient Name: [name] Date of Service: 03/06/2016 8:45 AM Medical Record Number: [id number] Patient Account Number: [id number] Date of Birth/Sex: 09-06-1966 (50 y.o. 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] Comorbid History: [3:Chronic sinus problems/congestion, Anemia, Asthma, Sleep Apnea, Coronary Artery Disease, Hypertension, Myocardial Infarction, Type II Diabetes, History of pressure wounds, Gout, Neuropathy] [N/A:N/A] Date Acquired: [3:06/26/2015] [N/A:N/A] Weeks of Treatment: [3:0] [N/A:N/A] Wound Status: [3:Open] [N/A:N/A] Measurements L x W x D 4x9x0.2 [N/A:N/A] (cm) Area (cm) : [3:28.274] [N/A:N/A] Volume (cm) : [3:5.655] [N/A:N/A] Classification: [3:Grade 2] [N/A:N/A] Exudate Amount: [3:Large] [N/A:N/A] Exudate Type: [3:Serosanguineous] [N/A:N/A] Exudate Color: [3:red, brown] [N/A:N/A] Foul Odor After [3:Yes] [N/A:N/A] Cleansing: Odor Anticipated Due to No [N/A:N/A] Product Use: Baltzell, Jacquees A. ([id number]) Wound Margin: Thickened N/A N/A Granulation Amount: Large (67-100%) N/A N/A Granulation Quality: Pink, Pale N/A N/A Necrotic Amount: Small (1-33%) N/A  N/A Exposed Structures: Fascia: No N/A N/A Fat: No Tendon: No Muscle: No Joint: No Bone: No Limited to Skin Breakdown Epithelialization: None N/A N/A Periwound Skin Texture: Scarring: Yes N/A N/A Edema: No Excoriation: No Induration: No Callus: No Crepitus: No Fluctuance: No Friable: No Rash: No Periwound Skin Moist: Yes N/A N/A Moisture: Maceration: No Dry/Scaly: No Periwound Skin Color: Atrophie Blanche: No N/A N/A Cyanosis: No Ecchymosis: No Erythema: No Hemosiderin Staining: No Mottled: No Pallor: No Rubor: No Temperature: No Abnormality N/A N/A Tenderness on Yes N/A N/A Palpation: Wound Preparation: Ulcer Cleansing: N/A N/A Rinsed/Irrigated with Saline Topical Anesthetic Applied: Other: lidocaine 4% Treatment Notes Electronic Signature(s) Signed: 03/06/2016 4:25:47 PM By: Elpidio Eric BSN, RN Entered By: Elpidio Eric on 03/06/2016 09:39:07 Adolph, Brayton AMarland Kitchen ([id number]) Mckown, Jens Som ([id number]) -------------------------------------------------------------------------------- Multi-Disciplinary Care Plan Details Patient Name: [name] Date of Service: 03/06/2016 8:45 AM Medical Record Number: [id number] Patient Account Number: [id number] Date of Birth/Sex: 06-03-1966 (50 y.o. Male) Treating RN: Afful, RN, BSN, [place] Sink Primary Care Physician: Angus Palms Other Clinician: Referring Physician: Angus Palms Treating Physician/Extender: Rudene Re in Treatment: 0 Active Inactive Abuse / Safety / Falls / Self Care Management Nursing Diagnoses: Knowledge deficit related to: safety; personal, health (wound), emergency Potential for falls Self care deficit: actual or potential Goals: Patient will remain injury free Date Initiated: 03/06/2016 Goal Status: Active Patient/caregiver will verbalize understanding of skin care regimen Date Initiated: 03/06/2016 Goal Status: Active Patient/caregiver will verbalize/demonstrate measure  taken to improve self care Date Initiated: 03/06/2016 Goal Status: Active Patient/caregiver will verbalize/demonstrate measures taken to improve the patient's personal safety Date Initiated: 03/06/2016 Goal Status: Active Patient/caregiver will verbalize/demonstrate measures taken to prevent injury and/or falls Date Initiated: 03/06/2016 Goal Status: Active Patient/caregiver will verbalize/demonstrate understanding of what to do in case of emergency Date Initiated: 03/06/2016 Goal Status: Active Interventions: Assess fall risk on admission and as needed Assess: immobility, friction, shearing, incontinence upon admission and as needed Assess impairment of mobility on admission and as needed per policy Assess self care needs on admission and as needed Patient referred to community resources (specify in notes) Provide education on basic hygiene Provide education on fall prevention Padmore, Dona A. ([id number]) Provide education on personal and home safety Provide education on safe transfers Notes: Orientation to the Wound Care Program Nursing Diagnoses: Knowledge deficit related to the wound healing center program Goals: Patient/caregiver will verbalize understanding of the Wound Healing Center Program Date Initiated: 03/06/2016 Goal Status: Active Interventions: Provide education on orientation to the wound center Notes: Pressure Nursing Diagnoses: Knowledge deficit related to causes and risk factors for pressure ulcer development Knowledge deficit related to management of  pressures ulcers Potential for impaired tissue integrity related to pressure, friction, moisture, and shear Goals: Patient will remain free from development of additional pressure ulcers Date Initiated: 03/06/2016 Goal Status: Active Patient will remain free of pressure ulcers Date Initiated: 03/06/2016 Goal Status: Active Patient/caregiver will verbalize risk factors for pressure ulcer development Date  Initiated: 03/06/2016 Goal Status: Active Patient/caregiver will verbalize understanding of pressure ulcer management Date Initiated: 03/06/2016 Goal Status: Active Interventions: Assess: immobility, friction, shearing, incontinence upon admission and as needed Assess offloading mechanisms upon admission and as needed Assess potential for pressure ulcer upon admission and as needed Provide education on pressure ulcers Amiri, Trisha A. ([id number]) Notes: Wound/Skin Impairment Nursing Diagnoses: Impaired tissue integrity Knowledge deficit related to smoking impact on wound healing Knowledge deficit related to ulceration/compromised skin integrity Goals: Patient/caregiver will verbalize understanding of skin care regimen Date Initiated: 03/06/2016 Goal Status: Active Ulcer/skin breakdown will have a volume reduction of 30% by week 4 Date Initiated: 03/06/2016 Goal Status: Active Ulcer/skin breakdown will have a volume reduction of 50% by week 8 Date Initiated: 03/06/2016 Goal Status: Active Ulcer/skin breakdown will have a volume reduction of 80% by week 12 Date Initiated: 03/06/2016 Goal Status: Active Ulcer/skin breakdown will heal within 14 weeks Date Initiated: 03/06/2016 Goal Status: Active Interventions: Assess patient/caregiver ability to obtain necessary supplies Assess patient/caregiver ability to perform ulcer/skin care regimen upon admission and as needed Assess ulceration(s) every visit Provide education on ulcer and skin care Treatment Activities: Referred to DME Chad Tiznado for dressing supplies : 03/06/2016 Skin care regimen initiated : 03/06/2016 Topical wound management initiated : 03/06/2016 Notes: Electronic Signature(s) Signed: 03/06/2016 9:17:56 AM By: Elpidio EricAfful, Rita BSN, RN Entered By: Elpidio EricAfful, Rita on 03/06/2016 09:17:56 Couper, Gil A. ([id number]) -------------------------------------------------------------------------------- Pain Assessment Details Patient  Name: Claudell KylePHILLIPS, Ysidro A. Date of Service: 03/06/2016 8:45 AM Medical Record Number: [id number] Patient Account Number: [id number] Date of Birth/Sex: 06-14-1966 (50 y.o. Male) Treating RN: Afful, RN, BSN, [place] Sinkita Primary Care Physician: Angus PalmsGEORGE, SIONNE Other Clinician: Referring Physician: Angus PalmsGEORGE, SIONNE Treating Physician/Extender: Rudene ReBritto, Errol Weeks in Treatment: 0 Active Problems Location of Pain Severity and Description of Pain Patient Has Paino Yes Site Locations Pain Location: Pain in Ulcers Rate the pain. Current Pain Level: 10 Character of Pain Describe the Pain: Aching, Burning, Tender Pain Management and Medication Current Pain Management: How does your pain impact your activities of daily livingo Sleep: Yes Bathing: Yes Appetite: Yes Relationship With Others: Yes Bladder Continence: Yes Emotions: Yes Bowel Continence: Yes Work: Yes Toileting: Yes Drive: Yes Dressing: Yes Hobbies: Yes Electronic Signature(s) Signed: 03/06/2016 4:25:47 PM By: Elpidio EricAfful, Rita BSN, RN Entered By: Elpidio EricAfful, Rita on 03/06/2016 08:57:30 Lemoine, Jens SomARREN A. ([id number]) -------------------------------------------------------------------------------- Patient/Caregiver Education Details Patient Name: Claudell KylePHILLIPS, Cameryn A. Date of Service: 03/06/2016 8:45 AM Medical Record Number: [id number] Patient Account Number: [id number] Date of Birth/Gender: 06-14-1966 (50 y.o. Male) Treating RN: Afful, RN, BSN, [place] Sinkita Primary Care Physician: Angus PalmsGEORGE, SIONNE Other Clinician: Referring Physician: Angus PalmsGEORGE, SIONNE Treating Physician/Extender: Rudene ReBritto, Errol Weeks in Treatment: 0 Education Assessment Education Provided To: Patient Education Topics Provided Basic Hygiene: Methods: Explain/Verbal Responses: State content correctly Pressure: Methods: Explain/Verbal Responses: State content correctly Safety: Methods: Explain/Verbal Responses: State content correctly Welcome To The Wound Care Center: Methods:  Explain/Verbal Responses: State content correctly Wound/Skin Impairment: Methods: Explain/Verbal Responses: State content correctly Electronic Signature(s) Signed: 03/06/2016 4:25:47 PM By: Elpidio EricAfful, Rita BSN, RN Entered By: Elpidio EricAfful, Rita on 03/06/2016 09:48:11 Okazaki, Waqas A. ([id number]) -------------------------------------------------------------------------------- Wound Assessment Details Patient Name: Claudell KylePHILLIPS, Blaise A. Date of Service: 03/06/2016 8:45 AM  Medical Record Number: [id number] Patient Account Number: [id number] Date of Birth/Sex: 1966/03/28 (50 y.o. Male) Treating RN: Afful, RN, BSN, [place] Sink Primary Care Physician: Angus Palms Other Clinician: Referring Physician: Angus Palms Treating Physician/Extender: Rudene Re in Treatment: 0 Wound Status Wound Number: 3 Primary Diabetic Wound/Ulcer of the Lower Etiology: Extremity Wound Location: Amputation Site - Below Knee - Posterior Wound Open Status: Wounding Event: Pressure Injury Comorbid Chronic sinus problems/congestion, Date Acquired: 06/26/2015 History: Anemia, Asthma, Sleep Apnea, Coronary Weeks Of Treatment: 0 Artery Disease, Hypertension, Myocardial Clustered Wound: No Infarction, Type II Diabetes, History of pressure wounds, Gout, Neuropathy Photos Photo Uploaded By: Elpidio Eric on 03/06/2016 16:25:02 Wound Measurements Length: (cm) 4 Width: (cm) 9 Depth: (cm) 0.2 Area: (cm) 28.274 Volume: (cm) 5.655 % Reduction in Area: % Reduction in Volume: Epithelialization: None Tunneling: No Undermining: No Wound Description Classification: Grade 2 Wound Margin: Thickened Exudate Amount: Large Exudate Type: Serosanguineous Exudate Color: red, brown Foul Odor After Cleansing: Yes Due to Product Use: No Wound Bed Granulation Amount: Large (67-100%) Exposed Structure Granulation Quality: Pink, Pale Fascia Exposed: No Filippone, Govind A. ([id number]) Necrotic Amount: Small (1-33%) Fat  Layer Exposed: No Necrotic Quality: Adherent Slough Tendon Exposed: No Muscle Exposed: No Joint Exposed: No Bone Exposed: No Limited to Skin Breakdown Periwound Skin Texture Texture Color No Abnormalities Noted: No No Abnormalities Noted: No Callus: No Atrophie Blanche: No Crepitus: No Cyanosis: No Excoriation: No Ecchymosis: No Fluctuance: No Erythema: No Friable: No Hemosiderin Staining: No Induration: No Mottled: No Localized Edema: No Pallor: No Rash: No Rubor: No Scarring: Yes Temperature / Pain Moisture Temperature: No Abnormality No Abnormalities Noted: No Tenderness on Palpation: Yes Dry / Scaly: No Maceration: No Moist: Yes Wound Preparation Ulcer Cleansing: Rinsed/Irrigated with Saline Topical Anesthetic Applied: Other: lidocaine 4%, Electronic Signature(s) Signed: 03/06/2016 4:25:47 PM By: Elpidio Eric BSN, RN Entered By: Elpidio Eric on 03/06/2016 09:10:42 Gade, Taylon AMarland Kitchen ([id number]) -------------------------------------------------------------------------------- Vitals Details Patient Name: [name] Date of Service: 03/06/2016 8:45 AM Medical Record Number: [id number] Patient Account Number: [id number] Date of Birth/Sex: [birth year] (50 y.o. Male) Treating RN: Afful, RN, BSN, Rita Primary Care Physician: Angus Palms Other Clinician: Referring Physician: Angus Palms Treating Physician/Extender: Rudene Re in Treatment: 0 Vital Signs Time Taken: 09:04 Temperature (F): 98.2 Height (in): 66 Pulse (bpm): 77 Source: Stated Respiratory Rate (breaths/min): 20 Weight (lbs): 280 Blood Pressure (mmHg): 121/77 Source: Stated Reference Range: 80 - 120 mg / dl Body Mass Index (BMI): 45.2 Electronic Signature(s) Signed: 03/06/2016 4:25:47 PM By: Elpidio Eric BSN, RN Entered By: Elpidio Eric on 03/06/2016 09:05:11

## 2016-03-13 ENCOUNTER — Encounter: Payer: PPO | Admitting: Surgery

## 2016-03-13 DIAGNOSIS — L97821 Non-pressure chronic ulcer of other part of left lower leg limited to breakdown of skin: Secondary | ICD-10-CM | POA: Diagnosis not present

## 2016-03-13 DIAGNOSIS — E11622 Type 2 diabetes mellitus with other skin ulcer: Secondary | ICD-10-CM | POA: Diagnosis not present

## 2016-03-13 DIAGNOSIS — M70852 Other soft tissue disorders related to use, overuse and pressure, left thigh: Secondary | ICD-10-CM | POA: Diagnosis not present

## 2016-03-14 NOTE — Progress Notes (Signed)
SHAD, LEDVINA ([id number]) Visit Report for 03/13/2016 Arrival Information Details Patient Name: [name], [name] Date of Service: 03/13/2016 8:45 AM Medical Record Number: [id number] Patient Account Number: [id number] Date of Birth/Sex: Mar 16, 1966 (50 y.o. Male) Treating RN: Afful, RN, BSN, [place] Sink Primary Care Physician: Angus Palms Other Clinician: Referring Physician: Angus Palms Treating Physician/Extender: Rudene Re in Treatment: 1 Visit Information History Since Last Visit Added or deleted any medications: No Patient Arrived: Ambulatory Any new allergies or adverse reactions: No Arrival Time: 08:39 Had a fall or experienced change in No Accompanied By: SELF activities of daily living that may affect Transfer Assistance: None risk of falls: Patient Identification Verified: Yes Signs or symptoms of abuse/neglect since last No Secondary Verification Process Yes visito Completed: Hospitalized since last visit: No Patient Requires Transmission-Based No Has Dressing in Place as Prescribed: Yes Precautions: Pain Present Now: No Patient Has Alerts: No Electronic Signature(s) Signed: 03/13/2016 6:25:48 PM By: Elpidio Eric BSN, RN Entered By: Elpidio Eric on 03/13/2016 08:41:07 Vasallo, Steffen A. ([id number]) -------------------------------------------------------------------------------- Clinic Level of Care Assessment Details Patient Name: Richard Kyle A. Date of Service: 03/13/2016 8:45 AM Medical Record Number: [id number] Patient Account Number: [id number] Date of Birth/Sex: 03-Jun-1966 (50 y.o. Male) Treating RN: Afful, RN, BSN, [place] Sink Primary Care Physician: Angus Palms Other Clinician: Referring Physician: Angus Palms Treating Physician/Extender: Rudene Re in Treatment: 1 Clinic Level of Care Assessment Items TOOL 4 Quantity Score  - Use when only an EandM is performed on FOLLOW-UP visit 0 ASSESSMENTS - Nursing Assessment /  Reassessment X - Reassessment of Co-morbidities (includes updates in patient status) 1 10 X - Reassessment of Adherence to Treatment Plan 1 5 ASSESSMENTS - Wound and Skin Assessment / Reassessment X - Simple Wound Assessment / Reassessment - one wound 1 5  - Complex Wound Assessment / Reassessment - multiple wounds 0  - Dermatologic / Skin Assessment (not related to wound area) 0 ASSESSMENTS - Focused Assessment  - Circumferential Edema Measurements - multi extremities 0  - Nutritional Assessment / Counseling / Intervention 0  - Lower Extremity Assessment (monofilament, tuning fork, pulses) 0  - Peripheral Arterial Disease Assessment (using hand held doppler) 0 ASSESSMENTS - Ostomy and/or Continence Assessment and Care  - Incontinence Assessment and Management 0  - Ostomy Care Assessment and Management (repouching, etc.) 0 PROCESS - Coordination of Care X - Simple Patient / Family Education for ongoing care 1 15  - Complex (extensive) Patient / Family Education for ongoing care 0  - Staff obtains Chiropractor, Records, Test Results / Process Orders 0  - Staff telephones HHA, Nursing Homes / Clarify orders / etc 0  - Routine Transfer to another Facility (non-emergent condition) 0 Stankey, Kyo A. ([id number])  - Routine Hospital Admission (non-emergent condition) 0  - New Admissions / Manufacturing engineer / Ordering NPWT, Apligraf, etc. 0  - Emergency Hospital Admission (emergent condition) 0  - Simple Discharge Coordination 0  - Complex (extensive) Discharge Coordination 0 PROCESS - Special Needs  - Pediatric / Minor Patient Management 0  - Isolation Patient Management 0  - Hearing / Language / Visual special needs 0  - Assessment of Community assistance (transportation, D/C planning, etc.) 0  - Additional assistance / Altered mentation 0  - Support Surface(s) Assessment (bed, cushion, seat, etc.) 0 INTERVENTIONS - Wound Cleansing /  Measurement X - Simple Wound Cleansing - one wound 1 5  - Complex Wound Cleansing - multiple wounds 0 X - Wound Imaging (photographs - any number of wounds)  1 5 []  - Wound Tracing (instead of photographs) 0 X - Simple Wound Measurement - one wound 1 5 []  - Complex Wound Measurement - multiple wounds 0 INTERVENTIONS - Wound Dressings []  - Small Wound Dressing one or multiple wounds 0 X - Medium Wound Dressing one or multiple wounds 1 15 []  - Large Wound Dressing one or multiple wounds 0 []  - Application of Medications - topical 0 []  - Application of Medications - injection 0 INTERVENTIONS - Miscellaneous []  - External ear exam 0 [name], [name] ([id number]) []  - Specimen Collection (cultures, biopsies, blood, body fluids, etc.) 0 []  - Specimen(s) / Culture(s) sent or taken to Lab for analysis 0 []  - Patient Transfer (multiple staff / Michiel Sites Lift / Similar devices) 0 []  - Simple Staple / Suture removal (25 or less) 0 []  - Complex Staple / Suture removal (26 or more) 0 []  - Hypo / Hyperglycemic Management (close monitor of Blood Glucose) 0 []  - Ankle / Brachial Index (ABI) - do not check if billed separately 0 X - Vital Signs 1 5 Has the patient been seen at the hospital within the last three years: Yes Total Score: 70 Level Of Care: New/Established - Level 2 Electronic Signature(s) Signed: 03/13/2016 6:25:48 PM By: Elpidio Eric BSN, RN Entered By: Elpidio Eric on 03/13/2016 08:58:50 [name], [name] Kitchen ([id number]) -------------------------------------------------------------------------------- Encounter Discharge Information Details Patient Name: Richard Kyle A. Date of Service: 03/13/2016 8:45 AM Medical Record Number: [id number] Patient Account Number: [id number] Date of Birth/Sex: June 11, 1966 (50 y.o. Male) Treating RN: Afful, RN, BSN, [place] Sink Primary Care Physician: Angus Palms Other Clinician: Referring Physician: Angus Palms Treating Physician/Extender: Rudene Re in Treatment: 1 Encounter Discharge Information Items Discharge Pain Level: 0 Discharge Condition: Stable Ambulatory Status: Ambulatory Discharge Destination: Home Transportation: Private Auto Accompanied By: self Schedule Follow-up Appointment: No Medication Reconciliation completed and provided to Patient/Care No Marvis Bakken: Provided on Clinical Summary of Care: 03/13/2016 Form Type Recipient Paper Patient DP Electronic Signature(s) Signed: 03/13/2016 9:11:00 AM By: Gwenlyn Perking Entered By: Gwenlyn Perking on 03/13/2016 09:11:00 Chisholm, Montay A. ([id number]) -------------------------------------------------------------------------------- Lower Extremity Assessment Details Patient Name: Richard Kyle A. Date of Service: 03/13/2016 8:45 AM Medical Record Number: [id number] Patient Account Number: [id number] Date of Birth/Sex: Jun 25, 1966 (50 y.o. Male) Treating RN: Afful, RN, BSN, [place] Sink Primary Care Physician: Angus Palms Other Clinician: Referring Physician: Angus Palms Treating Physician/Extender: Rudene Re in Treatment: 1 Electronic Signature(s) Signed: 03/13/2016 6:20:15 PM By: Elpidio Eric BSN, RN Entered By: Elpidio Eric on 03/13/2016 18:20:15 [name], [name] Kitchen ([id number]) -------------------------------------------------------------------------------- Multi Wound Chart Details Patient Name: Richard Kyle A. Date of Service: 03/13/2016 8:45 AM Medical Record Number: [id number] Patient Account Number: [id number] Date of Birth/Sex: [birth year] (50 y.o. 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60%] [N/A:N/A] % Reduction in Volume: 5.60% [N/A:N/A] Classification: [3:Grade 2] [N/A:N/A] Exudate Amount: [3:Large] [N/A:N/A] Exudate Type: [3:Serosanguineous] [N/A:N/A] Exudate Color: [3:red, brown] [N/A:N/A] Foul Odor After [3:Yes] [N/A:N/A] Cleansing: [name], [name] ([id number]) Odor Anticipated Due to No N/A N/A Product Use: Wound Margin: Thickened N/A N/A Granulation Amount: Large (67-100%) N/A N/A Granulation Quality: Pink, Pale N/A N/A Necrotic Amount: Small (1-33%) N/A N/A Exposed Structures: Fascia: No N/A N/A Fat: No Tendon: No Muscle: No Joint: No Bone: No Limited to Skin Breakdown Epithelialization: None N/A N/A Periwound Skin Texture: Scarring: Yes N/A N/A Edema: No Excoriation: No Induration: No Callus: No Crepitus: No Fluctuance: No Friable: No Rash: No Periwound Skin Moist: Yes N/A N/A Moisture: Maceration: No Dry/Scaly: No Periwound Skin Color: Atrophie Blanche: No N/A N/A Cyanosis: No Ecchymosis: No Erythema: No Hemosiderin Staining: No Mottled: No Pallor: No Rubor: No Temperature: No Abnormality N/A N/A Tenderness on Yes N/A N/A Palpation: Wound Preparation: Ulcer Cleansing: N/A N/A Rinsed/Irrigated with Saline Topical Anesthetic Applied: Other:  lidocaine 4% Treatment Notes Electronic Signature(s) [name], [name] ([id number]) Signed: 03/13/2016 6:25:48 PM By: Elpidio EricAfful, Rita BSN, RN Entered By: Elpidio EricAfful, Rita on 03/13/2016 08:51:25 [name], [name] ([id number]) -------------------------------------------------------------------------------- Multi-Disciplinary Care Plan Details Patient Name: Richard KylePHILLIPS, [name] Date of Service: 03/13/2016 8:45 AM Medical Record Number: [id number] Patient Account Number: [id number] Date of Birth/Sex: Jul 02, 1966 (50 y.o. Male) Treating RN: Afful, RN, BSN, [place] Sinkita Primary Care Physician: Angus PalmsGEORGE, SIONNE Other Clinician: Referring Physician: Angus PalmsGEORGE, SIONNE Treating Physician/Extender: Rudene ReBritto, Errol Weeks in Treatment: 1 Active Inactive Abuse / Safety / Falls / Self Care Management Nursing Diagnoses: Knowledge deficit related to: safety; personal, health (wound), emergency Potential for falls Self care deficit: actual or potential Goals: Patient will remain injury free Date Initiated: 03/06/2016 Goal Status: Active Patient/caregiver will verbalize understanding of skin care regimen Date Initiated: 03/06/2016 Goal Status: Active Patient/caregiver will verbalize/demonstrate measure taken to improve self care Date Initiated: 03/06/2016 Goal Status: Active Patient/caregiver will verbalize/demonstrate measures taken to improve the patient's personal safety Date Initiated: 03/06/2016 Goal Status: Active Patient/caregiver will verbalize/demonstrate measures taken to prevent injury and/or falls Date Initiated: 03/06/2016 Goal Status: Active Patient/caregiver will verbalize/demonstrate understanding of what to do in case of emergency Date Initiated: 03/06/2016 Goal Status: Active Interventions: Assess fall risk on admission and as needed Assess: immobility, friction, shearing, incontinence upon admission and as needed Assess impairment of mobility on admission and as needed per policy Assess self care  needs on admission and as needed Patient referred to community resources (specify in notes) Provide education on basic hygiene Provide education on fall prevention [name], [name] ([id number]) Provide education on personal and home safety Provide education on safe transfers Treatment Activities: Education provided on Basic Hygiene : 03/06/2016 Notes: Orientation to the Wound Care Program Nursing Diagnoses: Knowledge deficit related to the wound healing center program Goals: Patient/caregiver will verbalize understanding of the Wound Healing Center Program Date Initiated: 03/06/2016 Goal Status: Active Interventions: Provide education on orientation to the wound center Notes: Pressure Nursing Diagnoses: Knowledge deficit related to causes and risk factors for pressure ulcer development Knowledge deficit related to management of pressures ulcers Potential for impaired tissue integrity related to pressure, friction, moisture, and shear Goals: Patient will remain free from development of additional pressure ulcers Date Initiated: 03/06/2016 Goal Status: Active Patient will remain free of pressure ulcers Date Initiated: 03/06/2016 Goal Status: Active Patient/caregiver will verbalize risk factors for pressure ulcer development Date Initiated: 03/06/2016 Goal Status: Active Patient/caregiver will verbalize understanding of pressure ulcer  management Date Initiated: 03/06/2016 Goal Status: Active Interventions: Assess: immobility, friction, shearing, incontinence upon admission and as needed Assess offloading mechanisms upon admission and as needed [name], [name] ([id number]) Assess potential for pressure ulcer upon admission and as needed Provide education on pressure ulcers Notes: Wound/Skin Impairment Nursing Diagnoses: Impaired tissue integrity Knowledge deficit related to smoking impact on wound healing Knowledge deficit related to ulceration/compromised skin  integrity Goals: Patient/caregiver will verbalize understanding of skin care regimen Date Initiated: 03/06/2016 Goal Status: Active Ulcer/skin breakdown will have a volume reduction of 30% by week 4 Date Initiated: 03/06/2016 Goal Status: Active Ulcer/skin breakdown will have a volume reduction of 50% by week 8 Date Initiated: 03/06/2016 Goal Status: Active Ulcer/skin breakdown will have a volume reduction of 80% by week 12 Date Initiated: 03/06/2016 Goal Status: Active Ulcer/skin breakdown will heal within 14 weeks Date Initiated: 03/06/2016 Goal Status: Active Interventions: Assess patient/caregiver ability to obtain necessary supplies Assess patient/caregiver ability to perform ulcer/skin care regimen upon admission and as needed Assess ulceration(s) every visit Provide education on ulcer and skin care Treatment Activities: Referred to DME Yussuf Sawyers for dressing supplies : 03/06/2016 Skin care regimen initiated : 03/06/2016 Topical wound management initiated : 03/06/2016 Notes: Electronic Signature(s) Signed: 03/13/2016 6:25:48 PM By: Elpidio Eric BSN, RN Entered By: Elpidio Eric on 03/13/2016 08:51:09 [name], Colum A. ([id number]) Furuya, Garyn [name] Kitchen ([id number]) -------------------------------------------------------------------------------- Pain Assessment Details Patient Name: Richard Kyle A. Date of Service: 03/13/2016 8:45 AM Medical Record Number: [id number] Patient Account Number: [id number] Date of Birth/Sex: 10-05-1966 (50 y.o. Male) Treating RN: Afful, RN, BSN, [place] Sink Primary Care Physician: Angus Palms Other Clinician: Referring Physician: Angus Palms Treating Physician/Extender: Rudene Re in Treatment: 1 Active Problems Location of Pain Severity and Description of Pain Patient Has Paino No Site Locations Pain Management and Medication Current Pain Management: Electronic Signature(s) Signed: 03/13/2016 6:25:48 PM By: Elpidio Eric BSN, RN Entered  By: Elpidio Eric on 03/13/2016 08:41:17 Carrillo, Chinedum [name] Kitchen ([id number]) -------------------------------------------------------------------------------- Patient/Caregiver Education Details Patient Name: Richard Kyle A. Date of Service: 03/13/2016 8:45 AM Medical Record Number: [id number] Patient Account Number: [id number] Date of Birth/Gender: [birth year] (50 y.o. Male) Treating RN: Afful, RN, BSN, [place] Sink Primary Care Physician: Angus Palms Other Clinician: Referring Physician: Angus Palms Treating Physician/Extender: Rudene Re in Treatment: 1 Education Assessment Education Provided To: Patient Education Topics Provided Basic Hygiene: Methods: Explain/Verbal Responses: State content correctly Pressure: Methods: Explain/Verbal Responses: State content correctly Safety: Methods: Explain/Verbal Responses: State content correctly Welcome To The W[facility] Center: Methods: Explain/Verbal Responses: State content correctly Wound/Skin Impairment: Methods: Explain/Verbal Responses: State content correctly Electronic Signature(s) Signed: 03/13/2016 6:25:48 PM By: Elpidio Eric BSN, RN Entered By: Elpidio Eric on 03/13/2016 09:00:06 Hodgkins, Artyom A. ([id number]) -------------------------------------------------------------------------------- Wound Assessment Details Patient Name: Richard Kyle A. Date of Service: 03/13/2016 8:45 AM Medical Record Number: [id number] Patient Account Number: [id number] Date of Birth/Sex: 25-Apr-1966 (50 y.o. 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 Wound Open Status: Wounding Event: Pressure Injury Comorbid Chronic sinus  problems/congestion, Date Acquired: 06/26/2015 History: Anemia, Asthma, Sleep Apnea, Coronary Weeks Of Treatment: 1 Artery Disease, Hypertension, Myocardial Clustered Wound: No Infarction, Type II Diabetes, History of pressure wounds, Gout, Neuropathy Photos Photo Uploaded By: Elpidio Eric on 03/13/2016 18:03:04 Wound Measurements Length: (cm) 4 Width: (cm) 8.5  Depth: (cm) 0.2 Area: (cm) 26.704 Volume: (cm) 5.341 % Reduction in Area: 5.6% % Reduction in Volume: 5.6% Epithelialization: None Tunneling: No Undermining: No Wound Description Classification: Grade 2 Foul Odor Aft Wound Margin: Thickened Due to Produc Exudate Amount: Large Exudate Type: Serosanguineous Exudate Color: red, brown er Cleansing: Yes t Use: No Wound Bed Granulation Amount: Large (67-100%) Exposed Structure Granulation Quality: Pink, Pale Fascia Exposed: No Disanti, Zebulan A. ([id number]) Necrotic Amount: Small (1-33%) Fat Layer Exposed: No Necrotic Quality: Adherent Slough Tendon Exposed: No Muscle Exposed: No Joint Exposed: No Bone Exposed: No Limited to Skin Breakdown Periwound Skin Texture Texture Color No Abnormalities Noted: No No Abnormalities Noted: No Callus: No Atrophie Blanche: No Crepitus: No Cyanosis: No Excoriation: No Ecchymosis: No Fluctuance: No Erythema: No Friable: No Hemosiderin Staining: No Induration: No Mottled: No Localized Edema: No Pallor: No Rash: No Rubor: No Scarring: Yes Temperature / Pain Moisture Temperature: No Abnormality No Abnormalities Noted: No Tenderness on Palpation: Yes Dry / Scaly: No Maceration: No Moist: Yes Wound Preparation Ulcer Cleansing: Rinsed/Irrigated with Saline Topical Anesthetic Applied: Other: lidocaine 4%, Treatment Notes Wound #3 (Left, Posterior Amputation Site - Below Knee) 1. Cleansed with: Clean wound with Normal Saline 4. Dressing Applied: Aquacel Ag 5. Secondary Dressing Applied Gauze and  Kerlix/Conform 7. Secured with Secretary/administrator) Signed: 03/13/2016 6:25:48 PM By: Elpidio Eric BSN, RN Entered By: Elpidio Eric on 03/13/2016 08:51:02 Othman, Randel [name] Kitchen ([id number]) -------------------------------------------------------------------------------- Vitals Details Patient Name: Richard Kyle A. Date of Service: 03/13/2016 8:45 AM Medical Record Number: [id number] Patient Account Number: [id number] Date of Birth/Sex: 11-27-1965 (50 y.o. Male) Treating RN: Afful, RN, BSN, [place] Sink Primary Care Physician: Angus Palms Other Clinician: Referring Physician: Angus Palms Treating Physician/Extender: Rudene Re in Treatment: 1 Vital Signs Time Taken: 08:46 Temperature (F): 97.8 Height (in): 66 Pulse (bpm): 79 Weight (lbs): 280 Respiratory Rate (breaths/min): 20 Body Mass Index (BMI): 45.2 Blood Pressure (mmHg): 157/100 Reference Range: 80 - 120 mg / dl Electronic Signature(s) Signed: 03/13/2016 6:25:48 PM By: Elpidio Eric BSN, RN Entered By: Elpidio Eric on 03/13/2016 08:46:59

## 2016-03-14 NOTE — Progress Notes (Signed)
BRAVE, DACK ([id number]) Visit Report for 03/13/2016 Chief Complaint Document Details Patient Name: [name], [name]. Date of Service: 03/13/2016 8:45 AM Medical Record Number: [id number] Patient Account Number: [id number] Date of Birth/Sex: 1966-09-27 (50 y.o. Male) Treating RN: Afful, RN, BSN, [place] Sink Primary Care Physician: Angus Palms Other Clinician: Referring Physician: Angus Palms Treating Physician/Extender: Rudene Re in Treatment: 1 Information Obtained from: Patient Chief Complaint Patients presents for treatment of an open diabetic ulcer and pressure injury to his left lower thigh posteriorly for about [name] year. he is back after her recent consultation at [facility] outpatient prosthetic center Electronic Signature(s) Signed: 03/13/2016 9:08:23 AM By: Evlyn Kanner MD, FACS Entered By: Evlyn Kanner on 03/13/2016 09:08:23 [name], [name] AMarland Kitchen ([id number]) -------------------------------------------------------------------------------- HPI Details Patient Name: [name]. Date of Service: 03/13/2016 8:45 AM Medical Record Number: [id number] Patient Account Number: [id number] Date of Birth/Sex: 1966/04/15 (50 y.o. Male) Treating RN: Afful, RN, BSN, [place] Sink Primary Care Physician: Angus Palms Other Clinician: Referring Physician: Angus Palms Treating Physician/Extender: Rudene Re in Treatment: 1 History of Present Illness Location: left lower thigh posteriorly Quality: Patient reports experiencing [name] dull pain to affected area(s). Severity: Patient states wound are getting worse. 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 HPI Description: 50 year old gentleman with past medical history of diabetes mellitus with last hemoglobin A1c done in February 2016 was 8.7, hypertension, hypothyroidism, coronary artery disease, ischemic cardiomyopathy , nephropathy, obesity, peripheral neuropathy, peripheral vascular disease and sleep apnea. 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 He has had problems with his left BKA prosthesis for [name] long while and had [name] vacuum system and then had to be changed over to [name] sleeve system and has been working with the orthopedic and prosthetic department at [facility]. He has also had [name] plastic surgery opinion in the past. The ulceration on the posterior part of his lower thigh continued to be aggravating an enlarging and have [name] foul order and drainage. 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 Lubertha Basque the PA whonoted that he continue to wear his prosthesis against medical advice. details from his past surgical history notes that he's had [name] cardiac catheterization, coronary angioplasty, amputation of the left leg in 2012, debridement of skin and subcutis tissue in June 2013, application of wound VAC at that time, below knee amputation revision in April 2014. Recommendation was for [name] new socket to be made which should be above the new wound. He also recommended surgical revision of the redundant skin and to stay off the prosthesis to the wound heals but the patient would not be able to do this because of work. Hence he was advised to establish with [name] local wound clinic  for management of his current wound and follow-up with the amputee  clinic at [facility] or with Dr. Mindi Curling. If he decided to go back to them for surgery he should see Dr. Lanier Ensign Electronic Signature(s) Signed: 03/13/2016 9:08:30 AM By: Evlyn Kanner MD, FACS Entered By: Evlyn Kanner on 03/13/2016 09:08:30 [name], [name] ([id number]) [name], Richard AMarland Kitchen ([id number]) -------------------------------------------------------------------------------- Physical Exam Details Patient Name: [name]. Date of Service: 03/13/2016 8:45 AM Medical Record Number: [id number] Patient Account Number: [id number] Date of Birth/Sex: Jul 12, 1966 (50 y.o. Male) Treating RN: Afful, RN, BSN, [place] Sink Primary Care Physician: Angus Palms Other Clinician: Referring Physician: Angus Palms Treating Physician/Extender: Rudene Re in Treatment: 1 Constitutional . Pulse regular. Respirations normal and unlabored. Afebrile. . Eyes Nonicteric. Reactive to light. Ears, Nose, Mouth, and Throat Lips, teeth, and gums WNL.Marland Kitchen Moist mucosa without lesions. Neck supple and nontender. No palpable supraclavicular or cervical adenopathy. Normal sized without goiter. Respiratory WNL. No retractions.. Cardiovascular Pedal Pulses WNL. No clubbing, cyanosis or edema. Lymphatic No adneopathy. No adenopathy. No adenopathy. Musculoskeletal Adexa without tenderness or enlargement.. Digits and nails w/o clubbing, cyanosis, infection, petechiae, ischemia, or inflammatory conditions.. Integumentary (Hair, Skin) No suspicious lesions. No crepitus or fluctuance. No peri-wound warmth or erythema. No masses.Marland Kitchen Psychiatric Judgement and insight Intact.. No evidence of depression, anxiety, or agitation.. Notes the patient had [name] lot of drainage from this area but this was not purulent. On examining the wound there is healthy granulation tissue and [name] bit of maceration at the skin edges. No debridement was done  today. Electronic Signature(s) Signed: 03/13/2016 9:09:20 AM By: Evlyn Kanner MD, FACS Entered By: Evlyn Kanner on 03/13/2016 09:09:20 [name], [name] ([id number]) -------------------------------------------------------------------------------- Physician Orders Details Patient Name: [name]. Date of Service: 03/13/2016 8:45 AM Medical Record Number: [id number] Patient Account Number: [id number] Date of Birth/Sex: 09-24-1966 (50 y.o. Male) Treating RN: Afful, RN, BSN, [place] Sink Primary Care Physician: Angus Palms Other Clinician: Referring Physician: Angus Palms Treating Physician/Extender: Rudene Re in Treatment: 1 Verbal / Phone Orders: Yes Clinician: Afful, RN, BSN, Rita Read Back and Verified: Yes Diagnosis Coding Wound Cleansing Wound #3 Left,Posterior Amputation Site - Below Knee o Cleanse wound with mild soap and water o May Shower, gently pat wound dry prior to applying new dressing. o May shower with protection. Anesthetic Wound #3 Left,Posterior Amputation Site - Below Knee o Topical Lidocaine 4% cream applied to wound bed prior to debridement Skin Barriers/Peri-Wound Care Wound #3 Left,Posterior Amputation Site - Below Knee o Barrier cream Primary Wound Dressing Wound #3 Left,Posterior Amputation Site - Below Knee o Aquacel Ag Secondary Dressing o Gauze and Kerlix/Conform Dressing Change Frequency Wound #3 Left,Posterior Amputation Site - Below Knee o Change dressing every day. Follow-up Appointments Wound #3 Left,Posterior Amputation Site - Below Knee o Return Appointment in 2 weeks. Additional Orders / Instructions Wound #3 Left,Posterior Amputation Site - Below Knee o Increase protein intake. o OK to return to work o OK to return to work with the following restrictions: o Activity as tolerated [name], [name] ([id number]) o Other: - Vitamin [name], C, [employer], MVI Electronic Signature(s) Signed: 03/13/2016  4:28:22 PM By: Evlyn Kanner MD, FACS Signed: 03/13/2016 6:25:48 PM By: Elpidio Eric BSN, RN Entered By: Elpidio Eric on 03/13/2016 09:07:17 [name], [name] AMarland Kitchen ([id number]) -------------------------------------------------------------------------------- Problem List Details Patient Name: [name]. Date of Service: 03/13/2016 8:45 AM Medical Record Number: [id number] Patient Account Number: [id number] Date of Birth/Sex: 01-29-1966 (50 y.o. 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 ([id number]) -------------------------------------------------------------------------------- Progress Note Details Patient Name: [name]. Date of Service: 03/13/2016 8:45 AM Medical Record Number: [id number] Patient Account Number: [id number] Date of Birth/Sex: [birth year] (50 y.o. Male) Treating RN: Afful, RN, BSN, [place] Sink Primary Care Physician: Angus Palms Other Clinician: Referring Physician: Angus Palms Treating Physician/Extender: Rudene Re in Treatment: 1 Subjective Chief Complaint Information obtained from Patient Patients presents for treatment of an open diabetic ulcer and pressure injury to his left lower thigh posteriorly for about [name]  year. he is back after her recent consultation at [facility] outpatient prosthetic center History of Present Illness (HPI) The following HPI elements were documented for the patient's wound: Location: left lower thigh posteriorly Quality: Patient reports experiencing [name] dull pain to affected area(s). Severity: Patient states wound are getting worse. 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 50 year old gentleman with past medical history of diabetes mellitus with last hemoglobin A1c done in February 2016 was 8.7, hypertension, hypothyroidism, coronary artery disease, ischemic cardiomyopathy , nephropathy, obesity, peripheral neuropathy, peripheral vascular disease and sleep apnea. 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 He has had problems with his left BKA prosthesis for [name] long while and had [name] vacuum system and then had to be changed over to [name] sleeve system and has been working with the orthopedic and prosthetic department at [facility]. He has also had [name] plastic surgery opinion in the past. The ulceration on the posterior part of his lower thigh continued to be aggravating an enlarging and have [name] foul order and drainage. 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 Lubertha Basque the PA whonoted that he continue to wear his prosthesis against medical advice. details from his past surgical history notes that he's had [name] cardiac catheterization, coronary angioplasty, amputation of the left leg in 2012, debridement of skin and subcutis tissue in June 2013, application of wound VAC at that time, below knee amputation revision in April 2014. [name], [name]. ([id number]) Recommendation was for [name] new socket to be made which should be above the new wound. He also recommended surgical revision of the redundant skin and to stay off the prosthesis to the wound heals but the patient would not be able to do this because of work. Hence he was advised  to establish with [name] local wound clinic for management of his current wound and [facility] or with Dr. Mindi Curlingawney. If he decided to go back to them for surgery he should see Dr. Lanier Ensigneilley Objective Constitutional Pulse regular. Respirations normal and unlabored. Afebrile. Vitals Time Taken: 8:46 AM, Height: 66 in, Weight: 280 lbs, BMI: 45.2, Temperature: 97.8 F, Pulse: 79 bpm, Respiratory Rate: 20 breaths/min, Blood Pressure: 157/100 mmHg. Eyes Nonicteric. Reactive to light. Ears, Nose, Mouth, and Throat Lips, teeth, and gums WNL.Marland Kitchen. Moist mucosa without lesions. Neck supple and nontender. No palpable supraclavicular or cervical adenopathy. Normal sized without goiter. Respiratory WNL. No retractions.. Cardiovascular Pedal Pulses WNL. No clubbing, cyanosis or edema. Lymphatic No adneopathy. No adenopathy. No adenopathy. Musculoskeletal Adexa without tenderness or enlargement.. Digits and nails w/o clubbing, cyanosis, infection, petechiae, ischemia, or inflammatory conditions.Marland Kitchen. Psychiatric Judgement and insight Intact.. No evidence of depression, anxiety, or agitation.. General Notes: the patient had [name] lot of drainage from this area but this was not purulent. On examining the wound  there is healthy granulation tissue and [name] bit of maceration at the skin edges. No debridement was done today. [name], [name]. ([id number]) Integumentary (Hair, Skin) No suspicious lesions. No crepitus or fluctuance. No peri-wound warmth or erythema. No masses.. Wound #3 status is Open. Original cause of wound was Pressure Injury. The wound is located on the Left,Posterior Amputation Site - Below Knee. The wound measures 4cm length x 8.5cm width x 0.2cm depth; 26.704cm^2 area and 5.341cm^3 volume. The wound is limited to skin breakdown. There is no tunneling or undermining noted. There is [name] large amount of serosanguineous drainage noted. The wound margin is thickened. There is large (67-100%) pink, pale granulation within the wound bed. There is [name] small (1-33%) amount of necrotic tissue within the wound bed including Adherent Slough. The periwound skin appearance exhibited: Scarring, Moist. The periwound skin appearance did not exhibit: Callus, Crepitus, Excoriation, Fluctuance, Friable, Induration, Localized Edema, Rash, Dry/Scaly, Maceration, Atrophie Blanche, Cyanosis, Ecchymosis, Hemosiderin Staining, Mottled, Pallor, Rubor, Erythema. Periwound temperature was noted as No Abnormality. The periwound has tenderness on palpation. 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 He says his blood sugars fairly well controlled and he has been using vitamin supplements as discussed. He has seen the prosthetic department and is going to get [name] new socket for his left below-knee prosthesis. We'll continue to use silver alginate on [name] daily basis and  have asked him to offload as much as possible. Plan Wound Cleansing: Wound #3 Left,Posterior Amputation Site - Below Knee: Cleanse wound with mild soap and water May Shower, gently pat wound dry prior to applying new dressing. May shower with protection. Anesthetic: Wound #3 Left,Posterior Amputation Site - Below Knee: Topical Lidocaine 4% cream applied to wound bed prior to debridement [name], [name]. ([id number]) Skin Barriers/Peri-Wound Care: Wound #3 Left,Posterior Amputation Site - Below Knee: Barrier cream Primary Wound Dressing: Wound #3 Left,Posterior Amputation Site - Below Knee: Aquacel Ag Secondary Dressing: Gauze and Kerlix/Conform Dressing Change Frequency: Wound #3 Left,Posterior Amputation Site - Below Knee: Change dressing every day. Follow-up Appointments: Wound #3 Left,Posterior Amputation Site - Below  Knee: Return Appointment in 2 weeks. Additional Orders / Instructions: Wound #3 Left,Posterior Amputation Site - Below Knee: Increase protein intake. OK to return to work OK to return to work with the following restrictions: Activity as tolerated Other: - Vitamin [name], C, [employer], MVI The patient continues to work 10-12 hours [name] day on his prosthesis and is not able to offload. He says his blood sugars fairly well controlled and he has been using vitamin supplements as discussed. He has seen the prosthetic department and is going to get [name] new socket for his left below-knee prosthesis. We'll continue to use silver alginate on [name] daily basis and have asked him to offload as much as possible. Electronic Signature(s) Signed: 03/13/2016 9:12:04 AM By: Evlyn Kanner MD, FACS Entered By: Evlyn Kanner on 03/13/2016 09:12:03 [name], [name] AMarland Kitchen ([id number]) -------------------------------------------------------------------------------- SuperBill Details Patient Name: [name]. Date of Service: 03/13/2016 Medical Record Number: [id number] Patient Account Number:  [id number] Date of Birth/Sex: 08/04/1966 (50 y.o. 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 M70.852 Other soft tissue disorders related to use, overuse and pressure, left thigh E66.01 Morbid (severe) obesity due to excess calories Facility Procedures CPT4 Code: [id number] Description: [id number] - WOUND CARE VISIT-LEV 2 EST PT Modifier: Quantity: 1 Physician Procedures CPT4 Code Description: [id number] - WC PHYS LEVEL 3 - EST PT ICD-10 Description Diagnosis E11.622 Type 2 diabetes mellitus with other skin ulcer L97.122 Non-pressure chronic ulcer of left thigh with fat l M70.852 Other soft tissue disorders related to  use, overuse E66.01 Morbid (severe) obesity due to excess calories Modifier: ayer exposed and pressure, Quantity: 1 left thigh Electronic Signature(s) Signed: 03/13/2016 9:12:20 AM By: Evlyn Kanner MD, FACS Entered By: Evlyn Kanner on 03/13/2016 09:12:20

## 2016-03-19 DIAGNOSIS — L97919 Non-pressure chronic ulcer of unspecified part of right lower leg with unspecified severity: Secondary | ICD-10-CM | POA: Diagnosis not present

## 2016-03-26 ENCOUNTER — Telehealth: Payer: Self-pay | Admitting: Podiatry

## 2016-03-26 NOTE — Telephone Encounter (Signed)
Called patient and left a message stating that we were still waiting on Dr Angus PalmsSionne [name] to sign the diabetic papers and to call me at [phone] if any concerns or questions. Richard StanleyLisa

## 2016-03-26 NOTE — Telephone Encounter (Signed)
Can we also call to see how his feet are doing? He did not come back for his follow-up appointment.

## 2016-03-26 NOTE — Telephone Encounter (Signed)
Patient called the office wanting to know the status of his paperwork for his diabetic shoes and inserts?  He said about a month ago Dr. Ardelle AntonWagoner told  Him that he had [facility] the [place] [facility] [place]. Patient wants to know the status of this?  Please call him.

## 2016-03-27 ENCOUNTER — Encounter: Payer: PPO | Attending: Surgery | Admitting: Surgery

## 2016-03-27 DIAGNOSIS — I739 Peripheral vascular disease, unspecified: Secondary | ICD-10-CM | POA: Insufficient documentation

## 2016-03-27 DIAGNOSIS — Z7982 Long term (current) use of aspirin: Secondary | ICD-10-CM | POA: Diagnosis not present

## 2016-03-27 DIAGNOSIS — Z89512 Acquired absence of left leg below knee: Secondary | ICD-10-CM | POA: Diagnosis not present

## 2016-03-27 DIAGNOSIS — E1121 Type 2 diabetes mellitus with diabetic nephropathy: Secondary | ICD-10-CM | POA: Diagnosis not present

## 2016-03-27 DIAGNOSIS — Z7951 Long term (current) use of inhaled steroids: Secondary | ICD-10-CM | POA: Insufficient documentation

## 2016-03-27 DIAGNOSIS — E1142 Type 2 diabetes mellitus with diabetic polyneuropathy: Secondary | ICD-10-CM | POA: Diagnosis not present

## 2016-03-27 DIAGNOSIS — I2510 Atherosclerotic heart disease of native coronary artery without angina pectoris: Secondary | ICD-10-CM | POA: Insufficient documentation

## 2016-03-27 DIAGNOSIS — G4730 Sleep apnea, unspecified: Secondary | ICD-10-CM | POA: Insufficient documentation

## 2016-03-27 DIAGNOSIS — I255 Ischemic cardiomyopathy: Secondary | ICD-10-CM | POA: Insufficient documentation

## 2016-03-27 DIAGNOSIS — J45909 Unspecified asthma, uncomplicated: Secondary | ICD-10-CM | POA: Insufficient documentation

## 2016-03-27 DIAGNOSIS — Z794 Long term (current) use of insulin: Secondary | ICD-10-CM | POA: Insufficient documentation

## 2016-03-27 DIAGNOSIS — E039 Hypothyroidism, unspecified: Secondary | ICD-10-CM | POA: Insufficient documentation

## 2016-03-27 DIAGNOSIS — L97919 Non-pressure chronic ulcer of unspecified part of right lower leg with unspecified severity: Secondary | ICD-10-CM | POA: Diagnosis not present

## 2016-03-27 DIAGNOSIS — I252 Old myocardial infarction: Secondary | ICD-10-CM | POA: Insufficient documentation

## 2016-03-27 DIAGNOSIS — L97122 Non-pressure chronic ulcer of left thigh with fat layer exposed: Secondary | ICD-10-CM | POA: Insufficient documentation

## 2016-03-27 DIAGNOSIS — E11622 Type 2 diabetes mellitus with other skin ulcer: Secondary | ICD-10-CM | POA: Diagnosis not present

## 2016-03-27 DIAGNOSIS — Z6841 Body Mass Index (BMI) 40.0 and over, adult: Secondary | ICD-10-CM | POA: Diagnosis not present

## 2016-03-27 DIAGNOSIS — I10 Essential (primary) hypertension: Secondary | ICD-10-CM | POA: Diagnosis not present

## 2016-03-27 DIAGNOSIS — D649 Anemia, unspecified: Secondary | ICD-10-CM | POA: Diagnosis not present

## 2016-03-27 DIAGNOSIS — Z79899 Other long term (current) drug therapy: Secondary | ICD-10-CM | POA: Diagnosis not present

## 2016-03-27 DIAGNOSIS — L97121 Non-pressure chronic ulcer of left thigh limited to breakdown of skin: Secondary | ICD-10-CM | POA: Diagnosis not present

## 2016-03-27 DIAGNOSIS — M70852 Other soft tissue disorders related to use, overuse and pressure, left thigh: Secondary | ICD-10-CM | POA: Insufficient documentation

## 2016-03-27 DIAGNOSIS — M109 Gout, unspecified: Secondary | ICD-10-CM | POA: Diagnosis not present

## 2016-03-27 NOTE — Telephone Encounter (Signed)
Patient called me back and stated that he called his diabetic doctor to see why the forms were not signed and the doctor stated that she had sent them back and the patient also stated the doctor resent the forms and he is just waiting to get the shoes and inserts before he comes back in to see Dr Ardelle AntonWagoner. Misty StanleyLisa

## 2016-03-27 NOTE — Telephone Encounter (Signed)
Left message with patient today to call me back in the [place] office. [name]

## 2016-03-28 NOTE — Progress Notes (Signed)
NILS, THOR ([id number]) Visit Report for 03/27/2016 Chief Complaint Document Details Patient Name: [name], [name] A. Date of Service: 03/27/2016 8:00 AM Medical Record Number: [id number] Patient Account Number: [id number] Date of Birth/Sex: 1966/10/26 (50 y.o. Male) Treating RN: Phillis Haggis Primary Care Physician: Angus Palms Other Clinician: Referring Physician: Angus Palms Treating Physician/Extender: Rudene Re in Treatment: 3 Information Obtained from: Patient Chief Complaint Patients presents for treatment of an open diabetic ulcer and pressure injury to his left lower thigh posteriorly for about a year. he is back after her recent consultation at [facility] outpatient prosthetic center Electronic Signature(s) Signed: 03/27/2016 8:43:04 AM By: Evlyn Kanner MD, FACS Entered By: Evlyn Kanner on 03/27/2016 08:43:04 [name], Richard AMarland Kitchen ([id number]) -------------------------------------------------------------------------------- HPI Details Patient Name: Richard Kyle A. Date of Service: 03/27/2016 8:00 AM Medical Record Number: [id number] Patient Account Number: [id number] Date of Birth/Sex: 10-17-1966 (50 y.o. Male) Treating RN: Phillis Haggis Primary Care Physician: Angus Palms Other Clinician: Referring Physician: Angus Palms Treating Physician/Extender: Rudene Re in Treatment: 3 History of Present Illness Location: left lower thigh posteriorly Quality: Patient reports experiencing a dull pain to affected area(s). Severity: Patient states wound are getting worse. 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 HPI Description: 50 year old gentleman with past medical history of diabetes mellitus with last hemoglobin A1c done in February 2016 was 8.7, hypertension, hypothyroidism, coronary artery disease, ischemic cardiomyopathy , nephropathy, obesity, peripheral neuropathy, peripheral vascular disease and sleep apnea. 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 He has had problems with his left BKA prosthesis for a long while and had a vacuum system and then had to be changed over to a sleeve system and has been working with the orthopedic and prosthetic department at [facility]. He has also had a plastic surgery opinion in the past. The ulceration on the posterior part of his lower thigh continued to be aggravating an enlarging and have a foul order and drainage. He works as a Financial risk analyst at [employer] and has to wear his prostheses and standing for long periods of time as he is the only finding member in the family 03/06/2016 -- the notes from the [facility] and was seen by Ms. Lubertha Basque the PA whonoted that he continue to wear his prosthesis against medical advice. details from his past surgical history notes that he's had a cardiac catheterization, coronary angioplasty, amputation of the left leg in 2012, debridement of skin and subcutis tissue in June 2013, application of wound VAC at that time, below knee amputation revision in April 2014. Recommendation was for a new socket to be made which should be above the new wound. He also recommended surgical revision of the redundant skin and to stay off the prosthesis to the wound heals but the patient would not be able to do this because of work. Hence he was advised to establish with a local wound clinic for  management of his current wound and follow-up with the amputee clinic at [facility] or  with Dr. Mindi Curling. If he decided to go back to them for surgery he should see Dr. Lanier Ensign. 03/27/2016 -- he was unable to get his prosthesis made at the place where they had begun to mold and hence he has to find himself a mother prosthetic department and is working on this. Electronic Signature(s) TRAVOR, ROYCE ([id number]) Signed: 03/27/2016 8:44:03 AM By: Evlyn Kanner MD, FACS Entered By: Evlyn Kanner on 03/27/2016 08:44:03 [name], [name] ([id number]) -------------------------------------------------------------------------------- Physical Exam Details Patient Name: Richard Kyle A. Date of Service: 03/27/2016 8:00 AM Medical Record Number: [id number] Patient Account Number: [id number] Date of Birth/Sex: Jul 30, 1966 (50 y.o. Male) Treating RN: Phillis Haggis Primary Care Physician: Angus Palms Other Clinician: Referring Physician: Angus Palms Treating Physician/Extender: Rudene Re in Treatment: 3 Constitutional . Pulse regular. Respirations normal and unlabored. Afebrile. . Eyes Nonicteric. Reactive to light. Ears, Nose, Mouth, and Throat Lips, teeth, and gums WNL.Marland Kitchen Moist mucosa without lesions. Neck supple and nontender. No palpable supraclavicular or cervical adenopathy. Normal sized without goiter. Respiratory WNL. No retractions.. Breath sounds WNL, No rubs, rales, rhonchi, or wheeze.. Cardiovascular Heart rhythm and rate regular, no murmur or gallop.. Pedal Pulses WNL. No clubbing, cyanosis or edema. Lymphatic No adneopathy. No adenopathy. No adenopathy. Musculoskeletal Adexa without tenderness or enlargement.. Digits and nails w/o clubbing, cyanosis, infection, petechiae, ischemia, or inflammatory conditions.. Integumentary (Hair, Skin) No suspicious lesions. No crepitus or fluctuance. No peri-wound warmth or erythema. No masses.Marland Kitchen Psychiatric Judgement and insight  Intact.. No evidence of depression, anxiety, or agitation.. Notes the patient continues to have a rather large ulcerated area on the posterior part of his lower thigh and there is maceration at the edges of the skin. No debridement was done. Electronic Signature(s) Signed: 03/27/2016 8:44:44 AM By: Evlyn Kanner MD, FACS Entered By: Evlyn Kanner on 03/27/2016 08:44:44 Canny, [name] ([id number]) -------------------------------------------------------------------------------- Physician Orders Details Patient Name: Richard Kyle A. Date of Service: 03/27/2016 8:00 AM Medical Record Number: [id number] Patient Account Number: [id number] Date of Birth/Sex: Oct 23, 1966 (50 y.o. Male) Treating RN: Ashok Cordia, Debi Primary Care Physician: Angus Palms Other Clinician: Referring Physician: Angus Palms Treating Physician/Extender: Rudene Re in Treatment: 3 Verbal / Phone Orders: Yes Clinician: Ashok Cordia, Debi Read Back and Verified: Yes Diagnosis Coding Wound Cleansing Wound #3 Left,Posterior Amputation Site - Below Knee o Cleanse wound with mild soap and water o May Shower, gently pat wound dry prior to applying new dressing. o May shower with protection. Anesthetic Wound #3 Left,Posterior Amputation Site - Below Knee o Topical Lidocaine 4% cream applied to wound bed prior to debridement Skin Barriers/Peri-Wound Care Wound #3 Left,Posterior Amputation Site - Below Knee o Barrier cream Primary Wound Dressing Wound #3 Left,Posterior Amputation Site - Below Knee o Aquacel Ag Secondary Dressing o Gauze and Kerlix/Conform o XtraSorb Dressing Change Frequency Wound #3 Left,Posterior Amputation Site - Below Knee o Change dressing every day. Follow-up Appointments Wound #3 Left,Posterior Amputation Site - Below Knee o Return Appointment in 2 weeks. Additional Orders / Instructions Wound #3 Left,Posterior Amputation Site - Below Knee o Increase  protein intake. o OK to return to work o OK to return to work with the following restrictions: [name], [name] ([id number]) o Activity as tolerated o Other: - Vitamin A, C, ZInc, MVI Electronic Signature(s) Signed: 03/27/2016 4:06:04 PM By: Evlyn Kanner MD, FACS Signed: 03/27/2016 4:42:35 PM By: Alejandro Mulling Entered By: Alejandro Mulling on 03/27/2016 08:38:58 [name], [name] ([id number]) -------------------------------------------------------------------------------- Problem List Details Patient Name: Richard Kyle A. Date of Service: 03/27/2016 8:00  AM Medical Record Number: [id number] Patient Account Number: [id number] Date of Birth/Sex: May 29, 1966 (50 y.o. 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 ([id number]) -------------------------------------------------------------------------------- Progress Note Details Patient Name: Richard Kyle A. Date of Service: 03/27/2016 8:00 AM Medical Record Number: [id number] Patient Account Number: [id number] Date of Birth/Sex: 1966/09/24 (50 y.o. Male) Treating RN: Phillis Haggis Primary Care Physician: Angus Palms Other Clinician: Referring Physician: Angus Palms Treating  Physician/Extender: Rudene Re in Treatment: 3 Subjective Chief Complaint Information obtained from Patient Patients presents for treatment of an open diabetic ulcer and pressure injury to his left lower thigh posteriorly for about a year. he is back after her recent consultation at [facility] outpatient prosthetic center History of Present Illness (HPI) The following HPI elements were documented for the patient's wound: Location: left lower thigh posteriorly Quality: Patient reports experiencing a dull pain to affected area(s). Severity: Patient states wound are getting worse. 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 50 year old gentleman with past medical history of diabetes mellitus with last hemoglobin A1c done in February 2016 was 8.7, hypertension, hypothyroidism, coronary artery disease, ischemic cardiomyopathy , nephropathy, obesity, peripheral neuropathy, peripheral vascular disease and sleep apnea. 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 He has had problems with his left BKA prosthesis for a long while and had a vacuum system and then had to be changed over to a sleeve system and has been working with the orthopedic and prosthetic department at [facility]. He has also had a plastic surgery opinion in the past. The ulceration on the posterior part of his lower thigh continued to be aggravating an enlarging and  have a foul order and drainage. 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 Lubertha Basque the PA whonoted that he continue to wear his prosthesis against medical advice. details from his past surgical history notes that he's had a cardiac catheterization, coronary angioplasty, amputation of the left leg in 2012, debridement of skin and subcutis tissue in June 2013, application of wound VAC at that time, below knee amputation revision in April 2014. [name], [name] ([id number]) Recommendation was for a new socket to be made which should be above the new wound. He also recommended  surgical revision of the redundant skin and to stay off the prosthesis to the wound heals but the patient would not be able to do this because of work. Hence he was advised to establish with a local wound clinic for management of his current wound and follow-up with the amputee clinic at [facility] or with Dr. Mindi Curling. If he decided to go back to them for surgery he should see Dr. Lanier Ensign. 03/27/2016 -- he was unable to get his prosthesis made at the place where they had begun to mold and hence he has to find himself a mother prosthetic department and is working on this. Objective Constitutional Pulse regular. Respirations normal and unlabored. Afebrile. Vitals Time Taken: 8:12 AM, Height: 66 in, Weight: 280 lbs, BMI: 45.2, Temperature: 98.2 F, Pulse: 75 bpm, Respiratory Rate: 20 breaths/min, Blood Pressure: 134/59 mmHg. Eyes Nonicteric. Reactive to light. Ears, Nose, Mouth, and Throat Lips, teeth, and gums WNL.Marland Kitchen Moist mucosa without lesions. Neck supple and nontender. No palpable supraclavicular or cervical adenopathy. Normal sized without goiter. Respiratory WNL. No retractions.. Breath sounds WNL, No rubs, rales, rhonchi, or  wheeze.. Cardiovascular Heart rhythm and rate regular, no murmur or gallop.. Pedal Pulses WNL. No clubbing, cyanosis or edema. Lymphatic No adneopathy. No adenopathy. No adenopathy. Musculoskeletal Adexa without tenderness or enlargement.. Digits and nails w/o clubbing, cyanosis, infection, petechiae, ischemia, or inflammatory conditions.Marland Kitchen Psychiatric Judgement and insight Intact.. No evidence of depression, anxiety, or agitation.. General Notes: the patient continues to have a rather large ulcerated area on the posterior part of his lower thigh and there is maceration at the edges of the skin. No debridement was done. [name], [name] ([id number]) Integumentary (Hair, Skin) No suspicious lesions. No crepitus or fluctuance. No peri-wound warmth or erythema. No masses.. Wound #3 status is Open. Original cause of wound was Pressure Injury. The wound is located on the Left,Posterior Amputation Site - Below Knee. The wound measures 5cm length x 9cm width x 0.2cm depth; 35.343cm^2 area and 7.069cm^3 volume. The wound is limited to skin breakdown. There is no tunneling or undermining noted. There is a large amount of serosanguineous drainage noted. The wound margin is thickened. There is large (67-100%) pink, pale granulation within the wound bed. There is a small (1-33%) amount of necrotic tissue within the wound bed including Adherent Slough. The periwound skin appearance exhibited: Scarring, Moist. The periwound skin appearance did not exhibit: Callus, Crepitus, Excoriation, Fluctuance, Friable, Induration, Localized Edema, Rash, Dry/Scaly, Maceration, Atrophie Blanche, Cyanosis, Ecchymosis, Hemosiderin Staining, Mottled, Pallor, Rubor, Erythema. Periwound temperature was noted as No Abnormality. The periwound has tenderness on palpation. Assessment Active Problems ICD-10 E11.622 - Type 2 diabetes mellitus with other skin ulcer L97.122 - Non-pressure chronic ulcer of left thigh with fat  layer exposed M70.852 - Other soft tissue disorders related to use, overuse and pressure, left thigh E66.01 - Morbid (severe) obesity due to excess calories Plan Wound Cleansing: Wound #3 Left,Posterior Amputation Site - Below Knee: Cleanse wound with mild soap and water May Shower, gently pat wound dry prior to applying new dressing. May shower with protection. Anesthetic: Wound #3 Left,Posterior Amputation Site - Below Knee: Topical Lidocaine 4% cream applied to wound bed prior to debridement Skin Barriers/Peri-Wound Care: Wound #3 Left,Posterior Amputation Site - Below Knee: Barrier cream Primary Wound Dressing: Wound #3 Left,Posterior Amputation Site - Below Knee: Aquacel Ag Secondary Dressing: [name], [name] ([id number]) Gauze and Kerlix/Conform XtraSorb Dressing Change Frequency: Wound #3 Left,Posterior Amputation Site - Below Knee: Change dressing every day. Follow-up Appointments:  Wound #3 Left,Posterior Amputation Site - Below Knee: Return Appointment in 2 weeks. Additional Orders / Instructions: Wound #3 Left,Posterior Amputation Site - Below Knee: Increase protein intake. OK to return to work OK to return to work with the following restrictions: Activity as tolerated Other: - Vitamin A, C, ZInc, MVI He has seen the prosthetic department and is going to get a new socket for his left below-knee prosthesis. We'll continue to use silver alginate, Drawtex to help with the moisture, on a daily basis and have asked him to offload as much as possible. Good control of his diabetes and appropriate nutrition with vitamins have been discussed with him in detail. I have stressed floating and he will do it as much as possible as he has a long night shift at work. Electronic Signature(s) Signed: 03/27/2016 8:46:43 AM By: Evlyn KannerBritto, Jacek Colson MD, FACS Entered By: Evlyn KannerBritto, Danniela Mcbrearty on 03/27/2016 08:46:43 Espinoza, Kaio AMarland Kitchen.  ([id number]) -------------------------------------------------------------------------------- SuperBill Details Patient Name: Richard KylePHILLIPS, [name] Date of Service: 03/27/2016 Medical Record Number: [id number] Patient Account Number: [id number] Date of Birth/Sex: Feb 08, 1966 (50 y.o. 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 M70.852 Other soft tissue disorders related to use, overuse and pressure, left thigh E66.01 Morbid (severe) obesity due to excess calories Facility Procedures CPT4 Code: [id number] Description: 99213 - WOUND CARE VISIT-LEV 3 EST PT Modifier: Quantity: 1 Physician Procedures CPT4 Code Description: [id number] 99213 - WC PHYS LEVEL 3 - EST PT ICD-10 Description Diagnosis E11.622 Type 2 diabetes mellitus with other skin ulcer L97.122 Non-pressure chronic ulcer of left thigh with fat l M70.852 Other soft tissue disorders related to  use, overuse E66.01 Morbid (severe) obesity due to excess calories Modifier: ayer exposed and pressure, Quantity: 1 left thigh Electronic Signature(s) Signed: 03/27/2016 4:06:04 PM By: Evlyn KannerBritto, Merrill Villarruel MD, FACS Signed: 03/27/2016 4:42:35 PM By: Alejandro MullingPinkerton, Debra Previous Signature: 03/27/2016 8:47:32 AM Version By: Evlyn KannerBritto, Amyia Lodwick MD, FACS Previous Signature: 03/27/2016 8:46:55 AM Version By: Evlyn KannerBritto, Hazaiah Edgecombe MD, FACS Entered By: Alejandro MullingPinkerton, Debra on 03/27/2016 09:14:35

## 2016-03-28 NOTE — Progress Notes (Signed)
KAUSHIK, MAUL ([id number]) Visit Report for 03/27/2016 Arrival Information Details Patient Name: [name], SCHALK A. Date of Service: 03/27/2016 8:00 AM Medical Record Number: [id number] Patient Account Number: [id number] Date of Birth/Sex: [birth year] (50 y.o. Male) Treating RN: Phillis Haggis Primary Care Physician: Angus Palms Other Clinician: Referring Physician: Angus Palms Treating Physician/Extender: Rudene Re in Treatment: 3 Visit Information History Since Last Visit All ordered tests and consults were completed: No Patient Arrived: Ambulatory Added or deleted any medications: No Arrival Time: 08:10 Any new allergies or adverse reactions: No Accompanied By: self Had a fall or experienced change in No Transfer Assistance: None activities of daily living that may affect Patient Requires Transmission-Based No risk of falls: Precautions: Signs or symptoms of abuse/neglect since last No Patient Has Alerts: No visito Hospitalized since last visit: No Pain Present Now: Yes Electronic Signature(s) Signed: 03/27/2016 4:42:35 PM By: Alejandro Mulling Entered By: Alejandro Mulling on 03/27/2016 08:10:34 Fantini, Aleem A. ([id number]) -------------------------------------------------------------------------------- Clinic Level of Care Assessment Details Patient Name: Richard Kyle A. Date of Service: 03/27/2016 8:00 AM Medical Record Number: [id number] Patient Account Number: [id number] Date of Birth/Sex: 1966-02-22 (50 y.o. Male) Treating RN: Ashok Cordia, Debi Primary Care Physician: Angus Palms Other Clinician: Referring Physician: Angus Palms Treating Physician/Extender: Rudene Re in Treatment: 3 Clinic Level of Care Assessment Items TOOL 4 Quantity Score X - Use when only an EandM is performed on FOLLOW-UP visit 1 0 ASSESSMENTS - Nursing Assessment / Reassessment X - Reassessment of Co-morbidities (includes updates in patient status) 1  10 X - Reassessment of Adherence to Treatment Plan 1 5 ASSESSMENTS - Wound and Skin Assessment / Reassessment []  - Simple Wound Assessment / Reassessment - one wound 0 []  - Complex Wound Assessment / Reassessment - multiple wounds 0 []  - Dermatologic / Skin Assessment (not related to wound area) 0 ASSESSMENTS - Focused Assessment []  - Circumferential Edema Measurements - multi extremities 0 []  - Nutritional Assessment / Counseling / Intervention 0 []  - Lower Extremity Assessment (monofilament, tuning fork, pulses) 0 []  - Peripheral Arterial Disease Assessment (using hand held doppler) 0 ASSESSMENTS - Ostomy and/or Continence Assessment and Care []  - Incontinence Assessment and Management 0 []  - Ostomy Care Assessment and Management (repouching, etc.) 0 PROCESS - Coordination of Care X - Simple Patient / Family Education for ongoing care 1 15 []  - Complex (extensive) Patient / Family Education for ongoing care 0 []  - Staff obtains Chiropractor, Records, Test Results / Process Orders 0 []  - Staff telephones HHA, Nursing Homes / Clarify orders / etc 0 []  - Routine Transfer to another Facility (non-emergent condition) 0 Buddenhagen, Rober A. ([id number]) []  - Routine Hospital Admission (non-emergent condition) 0 []  - New Admissions / Manufacturing engineer / Ordering NPWT, Apligraf, etc. 0 []  - Emergency Hospital Admission (emergent condition) 0 X - Simple Discharge Coordination 1 10 []  - Complex (extensive) Discharge Coordination 0 PROCESS - Special Needs []  - Pediatric / Minor Patient Management 0 []  - Isolation Patient Management 0 []  - Hearing / Language / Visual special needs 0 []  - Assessment of Community assistance (transportation, D/C planning, etc.) 0 []  - Additional assistance / Altered mentation 0 []  - Support Surface(s) Assessment (bed, cushion, seat, etc.) 0 INTERVENTIONS - Wound Cleansing / Measurement []  - Simple Wound Cleansing - one wound 0 X - Complex Wound Cleansing -  multiple wounds 1 5 []  - Wound Imaging (photographs - any number of wounds) 0 X - Wound Tracing (instead of photographs) 1 5 []  - Simple  Wound Measurement - one wound 0 X - Complex Wound Measurement - multiple wounds 1 5 INTERVENTIONS - Wound Dressings  - Small Wound Dressing one or multiple wounds 0 X - Medium Wound Dressing one or multiple wounds 1 15  - Large Wound Dressing one or multiple wounds 0 X - Application of Medications - topical 1 5  - Application of Medications - injection 0 INTERVENTIONS - Miscellaneous  - External ear exam 0 Dobrowski, Teofilo A. ([id number])  - Specimen Collection (cultures, biopsies, blood, body fluids, etc.) 0  - Specimen(s) / Culture(s) sent or taken to Lab for analysis 0  - Patient Transfer (multiple staff / Michiel Sites Lift / Similar devices) 0  - Simple Staple / Suture removal (25 or less) 0  - Complex Staple / Suture removal (26 or more) 0  - Hypo / Hyperglycemic Management (close monitor of Blood Glucose) 0  - Ankle / Brachial Index (ABI) - do not check if billed separately 0 X - Vital Signs 1 5 Has the patient been seen at the hospital within the last three years: Yes Total Score: 80 Level Of Care: New/Established - Level 3 Electronic Signature(s) Signed: 03/27/2016 4:42:35 PM By: Alejandro Mulling Entered By: Alejandro Mulling on 03/27/2016 09:14:19 Versteeg, Toribio A. ([id number]) -------------------------------------------------------------------------------- Encounter Discharge Information Details Patient Name: Richard Kyle A. Date of Service: 03/27/2016 8:00 AM Medical Record Number: [id number] Patient Account Number: [id number] Date of Birth/Sex: 15-Oct-1966 (50 y.o. Male) Treating RN: Phillis Haggis Primary Care Physician: Angus Palms Other Clinician: Referring Physician: Angus Palms Treating Physician/Extender: Rudene Re in Treatment: 3 Encounter Discharge Information Items Discharge Pain Level:  0 Discharge Condition: Stable Ambulatory Status: Ambulatory Discharge Destination: Home Transportation: Private Auto Accompanied By: self Schedule Follow-up Appointment: Yes Medication Reconciliation completed and provided to Patient/Care Yes Elyshia Kumagai: Provided on Clinical Summary of Care: 03/27/2016 Form Type Recipient Paper Patient DP Electronic Signature(s) Signed: 03/27/2016 8:44:46 AM By: Gwenlyn Perking Entered By: Gwenlyn Perking on 03/27/2016 08:44:46 Tatham, Samarion A. ([id number]) -------------------------------------------------------------------------------- Lower Extremity Assessment Details Patient Name: Richard Kyle A. Date of Service: 03/27/2016 8:00 AM Medical Record Number: [id number] Patient Account Number: [id number] Date of Birth/Sex: [birth year] (50 y.o. Male) Treating RN: Ashok Cordia, Debi Primary Care Physician: Angus Palms Other Clinician: Referring Physician: Angus Palms Treating Physician/Extender: Rudene Re in Treatment: 3 Electronic Signature(s) Signed: 03/27/2016 4:42:35 PM By: Alejandro Mulling Entered By: Alejandro Mulling on 03/27/2016 08:13:59 Pickel, Glenda A. ([id number]) -------------------------------------------------------------------------------- Multi Wound Chart Details Patient Name: Richard Kyle A. Date of Service: 03/27/2016 8:00 AM Medical Record Number: [id number] Patient Account Number: [id number] Date of Birth/Sex: [birth year] (50 y.o. 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00%] [N/A:N/A] % Reduction in Volume: -25.00% [N/A:N/A] Classification: [3:Grade 2] [N/A:N/A] Exudate Amount: [3:Large] [N/A:N/A] Exudate Type: [  3:Serosanguineous] [N/A:N/A] Exudate Color: [3:red, brown] [N/A:N/A] Foul Odor After [3:Yes] [N/A:N/A] Cleansing: Tappan, Kellan A. ([id number]) Odor Anticipated Due to No N/A N/A Product Use: Wound Margin: Thickened N/A N/A Granulation Amount: Large (67-100%) N/A N/A Granulation Quality: Pink, Pale N/A N/A Necrotic Amount: Small (1-33%) N/A N/A Exposed Structures: Fascia: No N/A N/A Fat: No Tendon: No Muscle: No Joint: No Bone: No Limited to Skin Breakdown Epithelialization: None N/A N/A Periwound Skin Texture: Scarring: Yes N/A N/A Edema: No Excoriation: No Induration: No Callus: No Crepitus: No Fluctuance: No Friable: No Rash: No Periwound Skin Moist: Yes N/A N/A Moisture: Maceration: No Dry/Scaly: No Periwound Skin Color: Atrophie Blanche: No N/A N/A Cyanosis: No Ecchymosis: No Erythema: No Hemosiderin Staining: No Mottled: No Pallor: No Rubor: No Temperature: No Abnormality N/A N/A Tenderness on Yes N/A N/A Palpation: Wound Preparation: Ulcer Cleansing: N/A N/A Rinsed/Irrigated with Saline Topical Anesthetic Applied: Other: lidocaine 4% Treatment Notes Electronic Signature(s) NICHOLAI, WILLETTE  ([id number]) Signed: 03/27/2016 4:42:35 PM By: Alejandro Mulling Entered By: Alejandro Mulling on 03/27/2016 08:21:08 Williams, Nathanyel AMarland Kitchen ([id number]) -------------------------------------------------------------------------------- Multi-Disciplinary Care Plan Details Patient Name: Richard Kyle A. Date of Service: 03/27/2016 8:00 AM Medical Record Number: [id number] Patient Account Number: [id number] Date of Birth/Sex: [birth year] (50 y.o. Male) Treating RN: Ashok Cordia, Debi Primary Care Physician: Angus Palms Other Clinician: Referring Physician: Angus Palms Treating Physician/Extender: Rudene Re in Treatment: 3 Active Inactive Abuse / Safety / Falls / Self Care Management Nursing Diagnoses: Knowledge deficit related to: safety; personal, health (wound), emergency Potential for falls Self care deficit: actual or potential Goals: Patient will remain injury free Date Initiated: 03/06/2016 Goal Status: Active Patient/caregiver will verbalize understanding of skin care regimen Date Initiated: 03/06/2016 Goal Status: Active Patient/caregiver will verbalize/demonstrate measure taken to improve self care Date Initiated: 03/06/2016 Goal Status: Active Patient/caregiver will verbalize/demonstrate measures taken to improve the patient's personal safety Date Initiated: 03/06/2016 Goal Status: Active Patient/caregiver will verbalize/demonstrate measures taken to prevent injury and/or falls Date Initiated: 03/06/2016 Goal Status: Active Patient/caregiver will verbalize/demonstrate understanding of what to do in case of emergency Date Initiated: 03/06/2016 Goal Status: Active Interventions: Assess fall risk on admission and as needed Assess: immobility, friction, shearing, incontinence upon admission and as needed Assess impairment of mobility on admission and as needed per policy Assess self care needs on admission and as needed Patient referred to community resources (specify  in notes) Provide education on basic hygiene Provide education on fall prevention Sorbo, Bunnie A. ([id number]) Provide education on personal and home safety Provide education on safe transfers Treatment Activities: Education provided on Basic Hygiene : 03/13/2016 Notes: Orientation to the Wound Care Program Nursing Diagnoses: Knowledge deficit related to the wound healing center program Goals: Patient/caregiver will verbalize understanding of the Wound Healing Center Program Date Initiated: 03/06/2016 Goal Status: Active Interventions: Provide education on orientation to the wound center Notes: Pressure Nursing Diagnoses: Knowledge deficit related to causes and risk factors for pressure ulcer development Knowledge deficit related to management of pressures ulcers Potential for impaired tissue integrity related to pressure, friction, moisture, and shear Goals: Patient will remain free from development of additional pressure ulcers Date Initiated: 03/06/2016 Goal Status: Active Patient will remain free of pressure ulcers Date Initiated: 03/06/2016 Goal Status: Active Patient/caregiver will verbalize risk factors for pressure ulcer development Date Initiated: 03/06/2016 Goal Status: Active Patient/caregiver will verbalize understanding of pressure ulcer management Date Initiated: 03/06/2016 Goal Status: Active Interventions: Assess: immobility, friction, shearing, incontinence upon admission and as needed Assess offloading mechanisms upon admission and as  needed Budai, Stony A. ([id number]) Assess potential for pressure ulcer upon admission and as needed Provide education on pressure ulcers Notes: Wound/Skin Impairment Nursing Diagnoses: Impaired tissue integrity Knowledge deficit related to smoking impact on wound healing Knowledge deficit related to ulceration/compromised skin integrity Goals: Patient/caregiver will verbalize understanding of skin care regimen Date  Initiated: 03/06/2016 Goal Status: Active Ulcer/skin breakdown will have a volume reduction of 30% by week 4 Date Initiated: 03/06/2016 Goal Status: Active Ulcer/skin breakdown will have a volume reduction of 50% by week 8 Date Initiated: 03/06/2016 Goal Status: Active Ulcer/skin breakdown will have a volume reduction of 80% by week 12 Date Initiated: 03/06/2016 Goal Status: Active Ulcer/skin breakdown will heal within 14 weeks Date Initiated: 03/06/2016 Goal Status: Active Interventions: Assess patient/caregiver ability to obtain necessary supplies Assess patient/caregiver ability to perform ulcer/skin care regimen upon admission and as needed Assess ulceration(s) every visit Provide education on ulcer and skin care Treatment Activities: Referred to DME Haila Dena for dressing supplies : 03/06/2016 Skin care regimen initiated : 03/06/2016 Topical wound management initiated : 03/06/2016 Notes: Electronic Signature(s) Signed: 03/27/2016 4:42:35 PM By: Alejandro MullingPinkerton, Debra Entered By: Alejandro MullingPinkerton, Debra on 03/27/2016 08:21:02 Molitor, Vi A. ([id number]) Heidecker, Dene A. ([id number]) -------------------------------------------------------------------------------- Pain Assessment Details Patient Name: [name], Bijan A. Date of Service: 03/27/2016 8:00 AM Medical Record Number: [id number] Patient Account Number: [id number] Date of Birth/Sex: 09-17-1966 (50 y.o. Male) Treating RN: Phillis HaggisPinkerton, Debi Primary Care Physician: Angus PalmsGEORGE, SIONNE Other Clinician: Referring Physician: Angus PalmsGEORGE, SIONNE Treating Physician/Extender: Rudene ReBritto, Errol Weeks in Treatment: 3 Active Problems Location of Pain Severity and Description of Pain Patient Has Paino Yes Site Locations Pain Location: Pain in Ulcers Rate the pain. Current Pain Level: 10 Character of Pain Describe the Pain: Throbbing Pain Management and Medication Current Pain Management: Electronic Signature(s) Signed: 03/27/2016 4:42:35 PM By:  Alejandro MullingPinkerton, Debra Entered By: Alejandro MullingPinkerton, Debra on 03/27/2016 08:10:45 Guggenheim, Treyvone A. ([id number]) -------------------------------------------------------------------------------- Patient/Caregiver Education Details Patient Name: [name], Keilon A. Date of Service: 03/27/2016 8:00 AM Medical Record Number: [id number] Patient Account Number: [id number] Date of Birth/Gender: 09-17-1966 (50 y.o. Male) Treating RN: Phillis HaggisPinkerton, Debi Primary Care Physician: Angus PalmsGEORGE, SIONNE Other Clinician: Referring Physician: Angus PalmsGEORGE, SIONNE Treating Physician/Extender: Rudene ReBritto, Errol Weeks in Treatment: 3 Education Assessment Education Provided To: Patient Education Topics Provided Wound/Skin Impairment: Handouts: Other: change dressing as ordered Electronic Signature(s) Signed: 03/27/2016 4:42:35 PM By: Alejandro MullingPinkerton, Debra Entered By: Alejandro MullingPinkerton, Debra on 03/27/2016 08:24:20 Milroy, Markail A. ([id number]) -------------------------------------------------------------------------------- Wound Assessment Details Patient Name: [name], Millard A. Date of Service: 03/27/2016 8:00 AM Medical Record Number: [id number] Patient Account Number: [id number] Date of Birth/Sex: 09-17-1966 (50 y.o. 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 Wound Open Status: Wounding Event: Pressure Injury Comorbid Chronic sinus problems/congestion, Date Acquired: 06/26/2015 History: Anemia, Asthma, Sleep Apnea, Coronary Weeks Of Treatment: 3 Artery Disease, Hypertension, Myocardial Clustered Wound: No Infarction, Type II Diabetes, History of pressure wounds, Gout, Neuropathy Photos Photo Uploaded By: Alejandro MullingPinkerton, Debra on  03/27/2016 16:36:26 Wound Measurements Length: (cm) 5 Width: (cm) 9 Depth: (cm) 0.2 Area: (cm) 35.343 Volume: (cm) 7.069 % Reduction in Area: -25% % Reduction in Volume: -25% Epithelialization: None Tunneling: No Undermining: No Wound Description Classification: Grade 2 Foul Odor Afte Wound Margin: Thickened Due to Product Exudate Amount: Large Exudate Type: Serosanguineous Exudate Color: red, brown r Cleansing: Yes  Use: No Wound Bed Granulation Amount: Large (67-100%) Exposed Structure Granulation Quality: Pink, Pale Fascia Exposed: No Kroon, Dakotah A. ([id number]) Necrotic Amount: Small (1-33%) Fat Layer Exposed: No Necrotic Quality: Adherent Slough Tendon Exposed: No Muscle Exposed: No Joint Exposed: No Bone Exposed: No Limited to Skin Breakdown Periwound Skin Texture Texture Color No Abnormalities Noted: No No Abnormalities Noted: No Callus: No Atrophie Blanche: No Crepitus: No Cyanosis: No Excoriation: No Ecchymosis: No Fluctuance: No Erythema: No Friable: No Hemosiderin Staining: No Induration: No Mottled: No Localized Edema: No Pallor: No Rash: No Rubor: No Scarring: Yes Temperature / Pain Moisture Temperature: No Abnormality No Abnormalities Noted: No Tenderness on Palpation: Yes Dry / Scaly: No Maceration: No Moist: Yes Wound Preparation Ulcer Cleansing: Rinsed/Irrigated with Saline Topical Anesthetic Applied: Other: lidocaine 4%, Treatment Notes Wound #3 (Left, Posterior Amputation Site - Below Knee) 1. Cleansed with: Clean wound with Normal Saline 2. Anesthetic Topical Lidocaine 4% cream to wound bed prior to debridement 4. Dressing Applied: Aquacel Ag 5. Secondary Dressing Applied Dry Gauze Kerlix/Conform 7. Secured with Tape Notes xtrasorb Electronic Signature(s) TABER, SWEETSER ([id number]) Signed: 03/27/2016 4:42:35 PM By: Alejandro Mulling Entered By: Alejandro Mulling on 03/27/2016 08:20:54 Elliff, Shadrach AMarland Kitchen  ([id number]) -------------------------------------------------------------------------------- Vitals Details Patient Name: Richard Kyle A. Date of Service: 03/27/2016 8:00 AM Medical Record Number: [id number] Patient Account Number: [id number] Date of Birth/Sex: 10/05/1966 (50 y.o. Male) Treating RN: Ashok Cordia, Debi Primary Care Physician: Angus Palms Other Clinician: Referring Physician: Angus Palms Treating Physician/Extender: Rudene Re in Treatment: 3 Vital Signs Time Taken: 08:12 Temperature (F): 98.2 Height (in): 66 Pulse (bpm): 75 Weight (lbs): 280 Respiratory Rate (breaths/min): 20 Body Mass Index (BMI): 45.2 Blood Pressure (mmHg): 134/59 Reference Range: 80 - 120 mg / dl Electronic Signature(s) Signed: 03/27/2016 4:42:35 PM By: Alejandro Mulling Entered By: Alejandro Mulling on 03/27/2016 08:13:52

## 2016-04-02 DIAGNOSIS — Z89619 Acquired absence of unspecified leg above knee: Secondary | ICD-10-CM | POA: Diagnosis not present

## 2016-04-03 ENCOUNTER — Telehealth: Payer: Self-pay | Admitting: *Deleted

## 2016-04-03 NOTE — Telephone Encounter (Signed)
Called patient requesting he call me in regards to his diabetic shoe paperwork.   Patient called me right back.  Explained Dr Greggory StallionGeorge signed the paperwork for his shoes but the date he was last seen is outside the 6 month requirement by Medicare he will need to make an appointment to be seen.  He states that he has made an appointment for next week we can then resubmit the paperwork and get him scheduled to be measured.  Patient stated understanding.

## 2016-04-03 NOTE — Telephone Encounter (Signed)
Ok thanks. [name]

## 2016-04-10 ENCOUNTER — Encounter: Payer: PPO | Admitting: Surgery

## 2016-04-10 DIAGNOSIS — E11622 Type 2 diabetes mellitus with other skin ulcer: Secondary | ICD-10-CM | POA: Diagnosis not present

## 2016-04-10 DIAGNOSIS — L97821 Non-pressure chronic ulcer of other part of left lower leg limited to breakdown of skin: Secondary | ICD-10-CM | POA: Diagnosis not present

## 2016-04-10 DIAGNOSIS — L97919 Non-pressure chronic ulcer of unspecified part of right lower leg with unspecified severity: Secondary | ICD-10-CM | POA: Diagnosis not present

## 2016-04-10 DIAGNOSIS — M70852 Other soft tissue disorders related to use, overuse and pressure, left thigh: Secondary | ICD-10-CM | POA: Diagnosis not present

## 2016-04-10 NOTE — Progress Notes (Addendum)
Richard [name], Richard [name] ([id number]) Visit Report for 04/10/2016 Chief Complaint Document Details Patient Name: [name], [name] Date of Service: 04/10/2016 8:00 AM Medical Record Number: [id number] Patient Account Number: [id number] Date of Birth/Sex: 1966-03-10 (50 y.o. Male) Treating RN: Afful, RN, BSN, [place] Sink Primary Care Physician: Angus Palms Other Clinician: Referring Physician: Angus Palms Treating Physician/Extender: Richard [name] Treatment: 5 Information Obtained from: Patient Chief Complaint Patients presents for treatment of an open diabetic ulcer and pressure injury to his left lower thigh posteriorly for about a year. he is back after her recent consultation at [facility] outpatient prosthetic center Electronic Signature(s) Signed: 04/10/2016 8:35:04 AM By: Evlyn Kanner MD, FACS Entered By: Evlyn Kanner on 04/10/2016 08:35:04 [name], Richard [name] Kitchen ([id number]) -------------------------------------------------------------------------------- HPI Details Patient Name: Richard [name] A. Date of Service: 04/10/2016 8:00 AM Medical Record Number: [id number] Patient Account Number: [id number] Date of Birth/Sex: 07/27/1966 (50 y.o. Male) Treating RN: Afful, RN, BSN, [place] Sink Primary Care Physician: Angus Palms Other Clinician: Referring Physician: Angus Palms Treating Physician/Extender: Richard [name] Treatment: 5 History of Present Illness Location: left lower thigh posteriorly Quality: Patient reports experiencing a dull pain to affected area(s). Severity: Patient states wound are getting worse. 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 HPI Description: 49 year old gentleman with past medical history of diabetes mellitus with last hemoglobin A1c done [name] February 2016 was 8.7, hypertension, hypothyroidism, coronary artery disease, ischemic cardiomyopathy , nephropathy, obesity, peripheral neuropathy, peripheral vascular disease and sleep apnea. 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 He has had problems with his left BKA prosthesis for a long while and had a vacuum system and then had to be changed over to a sleeve system and has been working with the orthopedic and prosthetic department at [facility]. He has also had a plastic surgery opinion [name] the past. The ulceration on the posterior part of his lower thigh continued to be aggravating an enlarging and have a foul order and drainage. He works as a Financial risk analyst at [employer] and has to wear his prostheses and standing for long periods of time as he is the only finding member [name] the family 03/06/2016 -- the notes from the [facility] and was seen by Ms. Lubertha Basque the PA whonoted that he continue to wear his prosthesis against medical advice. details from his past surgical history notes that he's had a cardiac catheterization, coronary angioplasty, amputation of the left leg [name] 2012, debridement of skin and subcutis tissue [name] June 2013, application of wound VAC at that time, below knee amputation revision [name] April 2014. Recommendation was for a new socket to be made which should be above the new wound. He also recommended surgical revision of the redundant skin and to stay off the prosthesis to the wound heals but the patient would not be able to do this because of work. Hence he was advised to establish with a local wound clinic  for management of his current wound and follow-up with the amputee  clinic at [facility] or with Dr. Mindi Curling. If he decided to go back to them for surgery he should see Dr. Lanier Ensign. 03/27/2016 -- he was unable to get his prosthesis made at the place where they had begun to mold and hence he has to find himself a mother prosthetic department and is working on this. 04/10/2016 -- on his left popliteal fossa area he has got a small satellite abrasion with an ulceration which was caused by pulling off the socket on his skin. He still has to see his prosthetic department. Richard [name] ([id number]) Electronic Signature(s) Signed: 04/10/2016 8:36:03 AM By: Evlyn Kanner MD, FACS Entered By: Evlyn Kanner on 04/10/2016 08:36:03 Richard [name] ([id number]) -------------------------------------------------------------------------------- Physical Exam Details Patient Name: Richard [name] A. Date of Service: 04/10/2016 8:00 AM Medical Record Number: [id number] Patient Account Number: [id number] Date of Birth/Sex: 1966/05/19 (50 y.o. Male) Treating RN: Afful, RN, BSN, [place] Sink Primary Care Physician: Angus Palms Other Clinician: Referring Physician: Angus Palms Treating Physician/Extender: Richard [name] Treatment: 5 Constitutional . Pulse regular. Respirations normal and unlabored. Afebrile. . Eyes Nonicteric. Reactive to light. Ears, Nose, Mouth, and Throat Lips, teeth, and gums WNL.Marland Kitchen Moist mucosa without lesions. Neck supple and nontender. No palpable supraclavicular or cervical adenopathy. Normal sized without goiter. Respiratory WNL. No retractions.. Breath sounds WNL, No rubs, rales, rhonchi, or wheeze.. Cardiovascular Heart rhythm and rate regular, no murmur or gallop.. Pedal Pulses WNL. No clubbing, cyanosis or edema. Lymphatic No adneopathy. No adenopathy. No adenopathy. Musculoskeletal Adexa without tenderness or enlargement.. Digits and nails w/o clubbing, cyanosis,  infection, petechiae, ischemia, or inflammatory conditions.. Integumentary (Hair, Skin) No suspicious lesions. No crepitus or fluctuance. No peri-wound warmth or erythema. No masses.Marland Kitchen Psychiatric Judgement and insight Intact.. No evidence of depression, anxiety, or agitation.. Notes the large ulcerated area continues to have healthy granulation tissue and there are no palpable pulses over this area. No debridement was required today. The smaller area superiorly lateral to the original ulcer has a clean base but is tender. Electronic Signature(s) Signed: 04/10/2016 8:37:51 AM By: Evlyn Kanner MD, FACS Entered By: Evlyn Kanner on 04/10/2016 08:37:51 Ostermiller, Richard [name] ([id number]) -------------------------------------------------------------------------------- Physician Orders Details Patient Name: Richard [name] A. Date of Service: 04/10/2016 8:00 AM Medical Record Number: [id number] Patient Account Number: [id number] Date of Birth/Sex: Mar 23, 1966 (50 y.o. Male) Treating RN: Afful, RN, BSN, [place] Sink Primary Care Physician: Angus Palms Other Clinician: Referring Physician: Angus Palms Treating Physician/Extender: Richard [name] Treatment: 5 Verbal / Phone Orders: Yes Clinician: Afful, RN, BSN, Richard [name] Read Back and Verified: Yes Diagnosis Coding Wound Cleansing Wound #3 Left,Posterior Amputation Site - Below Knee o Cleanse wound with mild soap and water o May Shower, gently pat wound dry prior to applying new dressing. o May shower with protection. Anesthetic Wound #3 Left,Posterior Amputation Site - Below Knee o Topical Lidocaine 4% cream applied to wound bed prior to debridement Skin Barriers/Peri-Wound Care Wound #3 Left,Posterior Amputation Site - Below Knee o Barrier cream Primary Wound Dressing Wound #3 Left,Posterior Amputation Site - Below Knee o Aquacel Ag Secondary Dressing o Gauze and Kerlix/Conform o XtraSorb Dressing Change  Frequency Wound #3 Left,Posterior Amputation Site - Below Knee o Change dressing every day. Follow-up Appointments Wound #3 Left,Posterior Amputation Site - Below Knee o Return Appointment [name]: - 3 weeks Additional Orders / Instructions Wound #3 Left,Posterior Amputation Site - Below Knee o Increase protein intake. o OK to return to work o OK to return to work with the following restrictions: Lento, Roderick A. (  [id number]) o Activity as tolerated o Other: - Vitamin A, C, ZInc, MVI Electronic Signature(s) Signed: 04/10/2016 2:43:02 PM By: Elpidio EricAfful, Richard [name] BSN, RN Signed: 04/10/2016 4:02:17 PM By: Evlyn KannerBritto, Anberlin Diez MD, FACS Entered By: Elpidio EricAfful, Richard [name] on 04/10/2016 08:18:57 Bracher, [name] ([id number]) -------------------------------------------------------------------------------- Problem List Details Patient Name: Richard KylePHILLIPS, Eithen A. Date of Service: 04/10/2016 8:00 AM Medical Record Number: [id number] Patient Account Number: [id number] Date of Birth/Sex: 03/18/1966 (50 y.o. 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 ([id number]) -------------------------------------------------------------------------------- Progress Note Details Patient Name:  Richard KylePHILLIPS, Bernis A. Date of Service: 04/10/2016 8:00 AM Medical Record Number: [id number] Patient Account Number: [id number] Date of Birth/Sex: 03/18/1966 (50 y.o. Male) Treating RN: Afful, RN, BSN, [place] Sinkita Primary Care Physician: Angus PalmsGEORGE, SIONNE Other Clinician: Referring Physician: Angus PalmsGEORGE, SIONNE Treating Physician/Extender: Richard ReBritto, Damin Salido Weeks [name] Treatment: 5 Subjective Chief Complaint Information obtained from Patient Patients presents for treatment of an open diabetic ulcer and pressure injury to his left lower thigh posteriorly for about a year. he is back after [facility] History of Present Illness (HPI) The following HPI elements were documented for the patient's wound: Location: left lower thigh posteriorly Quality: Patient reports experiencing a dull pain to affected area(s). Severity: Patient states wound are getting worse. 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 50 year old gentleman with past medical history of diabetes mellitus with last hemoglobin A1c done [name] February 2016 was 8.7, hypertension, hypothyroidism, coronary artery disease, ischemic cardiomyopathy , nephropathy, obesity, peripheral neuropathy, peripheral vascular disease and sleep apnea. 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 He has had problems with his left BKA prosthesis for a long while  and had a vacuum system and then had to be changed over to a sleeve system and has [facility] He has also had a plastic surgery opinion [name] the past. The ulceration on the posterior part of his lower thigh continued to be aggravating an enlarging and have a foul order and drainage. 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 Lubertha BasqueJennifer Dye the PA whonoted that he continue to wear his prosthesis against medical advice. details from his past surgical history  notes that he's had a cardiac catheterization, coronary angioplasty, amputation of the left leg [name] 2012, debridement of skin and subcutis tissue [name] June 2013, application of wound VAC at that time, below knee amputation revision [name] April 2014. Pesce, [name] ([id number]) Recommendation was for a new socket to be made which should be above the new wound. He also recommended surgical revision of the redundant skin and to stay off the prosthesis to the wound heals but the patient would not be able to do this because of work. Hence he was advised to establish with a local wound clinic for management of his current wound and follow-up with the amputee clinic at [facility] or with Dr. Mindi Curling. If he decided to go back to them for surgery he should see Dr. Lanier Ensign. 03/27/2016 -- he was unable to get his prosthesis made at the place where they had begun to mold and hence he has to find himself a mother prosthetic department and is working on this. 04/10/2016 -- on his left popliteal fossa area he has got a small satellite abrasion with an ulceration which was caused by pulling off the socket on his skin. He still has to see his prosthetic department. Objective Constitutional Pulse regular. Respirations normal and unlabored.  Afebrile. Vitals Time Taken: 8:04 AM, Height: 66 [name], Weight: 280 lbs, BMI: 45.2, Temperature: 98.4 F, Pulse: 74 bpm, Respiratory Rate: 18 breaths/min, Blood Pressure: 131/85 mmHg. Eyes Nonicteric. Reactive to light. Ears, Nose, Mouth, and Throat Lips, teeth, and gums WNL.Marland Kitchen Moist mucosa without lesions. Neck supple and nontender. No palpable supraclavicular or cervical adenopathy. Normal sized without goiter. Respiratory WNL. No retractions.. Breath sounds WNL, No rubs, rales, rhonchi, or wheeze.. Cardiovascular Heart rhythm and rate regular, no murmur or gallop.. Pedal Pulses WNL. No clubbing, cyanosis or edema. Lymphatic No adneopathy. No adenopathy. No adenopathy. Musculoskeletal Adexa without tenderness or enlargement.. Digits and nails w/o clubbing, cyanosis, infection, petechiae, ischemia, or inflammatory conditions.Marland Kitchen Psychiatric Judgement and insight Intact.. No evidence of depression, anxiety, or agitation.Marland Kitchen Basilio, [name] ([id number]) General Notes: the large ulcerated area continues to have healthy granulation tissue and there are no palpable pulses over this area. No debridement was required today. The smaller area superiorly lateral to the original ulcer has a clean base but is tender. Integumentary (Hair, Skin) No suspicious lesions. No crepitus or fluctuance. No peri-wound warmth or erythema. No masses.. Wound #3 status is Open. Original cause of wound was Pressure Injury. The wound is located on the Left,Posterior Amputation Site - Below Knee. The wound measures 6cm length x 9cm width x 0.2cm depth; 42.412cm^2 area and 8.482cm^3 volume. The wound is limited to skin breakdown. There is no tunneling or undermining noted. There is a large amount of purulent drainage noted. The wound margin is thickened. There is large (67-100%) pink, pale granulation within the wound bed. There is a small (1-33%) amount of necrotic tissue within the wound bed including Adherent Slough.  The periwound skin appearance exhibited: Scarring, Maceration, Moist. The periwound skin appearance did not exhibit: Callus, Crepitus, Excoriation, Fluctuance, Friable, Induration, Localized Edema, Rash, Dry/Scaly, Atrophie Blanche, Cyanosis, Ecchymosis, Hemosiderin Staining, Mottled, Pallor, Rubor, Erythema. Periwound temperature was noted as No Abnormality. The periwound has tenderness on palpation. 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 He has seen the prosthetic department and is going to get a new socket for his left below-knee prosthesis. We'll continue to use silver alginate, Drawtex to help with the moisture, on a daily basis and have asked him to offload as much as possible. Good control of his diabetes and appropriate nutrition with vitamins have been discussed with him [name] detail. I have stressed offloading and he will do it as much as possible as he has a long night shift at work. Gan, [name] ([id number]) Plan Wound Cleansing: Wound #3 Left,Posterior Amputation Site - Below Knee: Cleanse wound with mild soap and water May Shower, gently pat wound dry prior to applying new dressing. May shower with protection. Anesthetic: Wound #3 Left,Posterior Amputation Site - Below Knee: Topical Lidocaine 4% cream applied to wound bed prior to debridement Skin Barriers/Peri-Wound Care: Wound #3 Left,Posterior Amputation Site - Below Knee: Barrier cream Primary Wound Dressing: Wound #3 Left,Posterior Amputation Site - Below Knee: Aquacel Ag Secondary Dressing: Gauze and  Kerlix/Conform XtraSorb Dressing Change Frequency: Wound #3 Left,Posterior Amputation Site - Below Knee: Change dressing every day. Follow-up Appointments: Wound #3 Left,Posterior Amputation Site - Below Knee: Return Appointment [name]: - 3 weeks Additional Orders / Instructions: Wound #3 Left,Posterior Amputation Site - Below Knee: Increase protein intake. OK to return to work OK to return to work with the following restrictions: Activity as tolerated Other: - Vitamin A, C, ZInc, MVI For the new area I have asked him to use a small piece of silver alginate and use a protective foam to offload this area and he is doing this. He has seen the prosthetic department and is going to get a new socket for his left below-knee prosthesis. We'll continue to use silver alginate, Drawtex to help with the moisture, on a daily basis and have asked him to offload as much as possible. Good control of his diabetes and appropriate nutrition with vitamins have been discussed with him [name] detail. Puffenbarger, [name] ([id number]) I have stressed offloading and he will do it as much as possible as he has a long night shift at work. Electronic Signature(s) Signed: 04/10/2016 4:03:56 PM By: Evlyn Kanner MD, FACS Previous Signature: 04/10/2016 8:40:29 AM Version By: Evlyn Kanner MD, FACS Entered By: Evlyn Kanner on 04/10/2016 16:03:56 Delair, Richard [name] Kitchen ([id number]) -------------------------------------------------------------------------------- SuperBill Details Patient Name: Richard [name] A. Date of Service: 04/10/2016 Medical Record Number: [id number] Patient Account Number: [id number] Date of Birth/Sex: 12-Jul-1966 (50 y.o. 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 M70.852 Other soft tissue disorders related to use, overuse and pressure, left thigh E66.01 Morbid (severe) obesity due to excess calories Facility Procedures CPT4 Code: 44010272 Description: 53664 - WOUND CARE VISIT-LEV 2 EST PT Modifier: Quantity: 1 Physician Procedures CPT4 Code Description: [id number] - WC PHYS LEVEL 3 - EST PT ICD-10 Description Diagnosis E11.622 Type 2 diabetes mellitus with other skin ulcer L97.122 Non-pressure chronic ulcer of left thigh with fat l M70.852 Other soft tissue disorders related to  use, overuse E66.01 Morbid (severe) obesity due to excess calories Modifier: ayer  exposed and pressure, Quantity: 1 left thigh Electronic Signature(s) Signed: 04/10/2016 8:40:44 AM By: Evlyn Kanner MD, FACS Entered By: Evlyn Kanner on 04/10/2016 08:40:44

## 2016-04-10 NOTE — Progress Notes (Signed)
Idamae SchullerHILLIPS, [place] A. ([id number]) Visit Report for 04/10/2016 Arrival Information Details Patient Name: [name], [name] Date of Service: 04/10/2016 8:00 AM Medical Record Number: [id number] Patient Account Number: [id number] Date of Birth/Sex: 1966/06/03 (50 y.o. Male) Treating RN: Afful, RN, BSN, [place] Sinkita Primary Care Physician: Angus PalmsGEORGE, SIONNE Other Clinician: Referring Physician: Angus PalmsGEORGE, SIONNE Treating Physician/Extender: Rudene ReBritto, Errol Weeks in Treatment: 5 Visit Information History Since Last Visit Added or deleted any medications: No Patient Arrived: Ambulatory Any new allergies or adverse reactions: No Arrival Time: 08:02 Had a fall or experienced change in No Accompanied By: self activities of daily living that may affect Transfer Assistance: None risk of falls: Patient Identification Verified: Yes Signs or symptoms of abuse/neglect since last No Secondary Verification Process Yes visito Completed: Hospitalized since last visit: No Patient Requires Transmission-Based No Has Dressing in Place as Prescribed: Yes Precautions: Pain Present Now: No Patient Has Alerts: No Electronic Signature(s) Signed: 04/10/2016 2:43:02 PM By: Elpidio EricAfful, Rita BSN, RN Entered By: Elpidio EricAfful, Rita on 04/10/2016 08:02:52 Deer, [name] Kitchen. ([id number]) -------------------------------------------------------------------------------- Clinic Level of Care Assessment Details Patient Name: Richard KylePHILLIPS, [name] Date of Service: 04/10/2016 8:00 AM Medical Record Number: [id number] Patient Account Number: [id number] Date of Birth/Sex: 1966/06/03 (50 y.o. Male) Treating RN: Afful, RN, BSN, [place] Sinkita Primary Care Physician: Angus PalmsGEORGE, SIONNE Other Clinician: Referring Physician: Angus PalmsGEORGE, SIONNE Treating Physician/Extender: Rudene ReBritto, Errol Weeks in Treatment: 5 Clinic Level of Care Assessment Items TOOL 4 Quantity Score []  - Use when only an EandM is performed on FOLLOW-UP visit 0 ASSESSMENTS - Nursing Assessment /  Reassessment X - Reassessment of Co-morbidities (includes updates in patient status) 1 10 X - Reassessment of Adherence to Treatment Plan 1 5 ASSESSMENTS - Wound and Skin Assessment / Reassessment X - Simple Wound Assessment / Reassessment - one wound 1 5 []  - Complex Wound Assessment / Reassessment - multiple wounds 0 []  - Dermatologic / Skin Assessment (not related to wound area) 0 ASSESSMENTS - Focused Assessment []  - Circumferential Edema Measurements - multi extremities 0 []  - Nutritional Assessment / Counseling / Intervention 0 []  - Lower Extremity Assessment (monofilament, tuning fork, pulses) 0 []  - Peripheral Arterial Disease Assessment (using hand held doppler) 0 ASSESSMENTS - Ostomy and/or Continence Assessment and Care []  - Incontinence Assessment and Management 0 []  - Ostomy Care Assessment and Management (repouching, etc.) 0 PROCESS - Coordination of Care X - Simple Patient / Family Education for ongoing care 1 15 []  - Complex (extensive) Patient / Family Education for ongoing care 0 []  - Staff obtains ChiropractorConsents, Records, Test Results / Process Orders 0 []  - Staff telephones HHA, Nursing Homes / Clarify orders / etc 0 []  - Routine Transfer to another Facility (non-emergent condition) 0 Sundby, [name] ([id number]) []  - Routine Hospital Admission (non-emergent condition) 0 []  - New Admissions / Manufacturing engineernsurance Authorizations / Ordering NPWT, Apligraf, etc. 0 []  - Emergency Hospital Admission (emergent condition) 0 []  - Simple Discharge Coordination 0 []  - Complex (extensive) Discharge Coordination 0 PROCESS - Special Needs []  - Pediatric / Minor Patient Management 0 []  - Isolation Patient Management 0 []  - Hearing / Language / Visual special needs 0 []  - Assessment of Community assistance (transportation, D/C planning, etc.) 0 []  - Additional assistance / Altered mentation 0 []  - Support Surface(s) Assessment (bed, cushion, seat, etc.) 0 INTERVENTIONS - Wound Cleansing /  Measurement X - Simple Wound Cleansing - one wound 1 5 []  - Complex Wound Cleansing - multiple wounds 0 X - Wound Imaging (photographs - any number of wounds)  1 5  - Wound Tracing (instead of photographs) 0 X - Simple Wound Measurement - one wound 1 5  - Complex Wound Measurement - multiple wounds 0 INTERVENTIONS - Wound Dressings  - Small Wound Dressing one or multiple wounds 0 X - Medium Wound Dressing one or multiple wounds 1 15  - Large Wound Dressing one or multiple wounds 0  - Application of Medications - topical 0  - Application of Medications - injection 0 INTERVENTIONS - Miscellaneous  - External ear exam 0 Harbeson, Laverne A. ([id number])  - Specimen Collection (cultures, biopsies, blood, body fluids, etc.) 0  - Specimen(s) / Culture(s) sent or taken to Lab for analysis 0  - Patient Transfer (multiple staff / Michiel Sites Lift / Similar devices) 0  - Simple Staple / Suture removal (25 or less) 0  - Complex Staple / Suture removal (26 or more) 0  - Hypo / Hyperglycemic Management (close monitor of Blood Glucose) 0  - Ankle / Brachial Index (ABI) - do not check if billed separately 0 X - Vital Signs 1 5 Has the patient been seen at the hospital within the last three years: Yes Total Score: 70 Level Of Care: New/Established - Level 2 Electronic Signature(s) Signed: 04/10/2016 2:43:02 PM By: Elpidio Eric BSN, RN Entered By: Elpidio Eric on 04/10/2016 08:19:31 Ballowe, [name] Kitchen ([id number]) -------------------------------------------------------------------------------- Encounter Discharge Information Details Patient Name: Richard Kyle A. Date of Service: 04/10/2016 8:00 AM Medical Record Number: [id number] Patient Account Number: [id number] Date of Birth/Sex: 10-25-1966 (50 y.o. Male) Treating RN: Afful, RN, BSN, [place] Sink Primary Care Physician: Angus Palms Other Clinician: Referring Physician: Angus Palms Treating Physician/Extender: Rudene Re in Treatment: 5 Encounter Discharge Information Items Discharge Pain Level: 0 Discharge Condition: Stable Ambulatory Status: Ambulatory Discharge Destination: Home Transportation: Private Auto Accompanied By: self Schedule Follow-up Appointment: No Medication Reconciliation completed and provided to Patient/Care No Candice Lunney: Provided on Clinical Summary of Care: 04/10/2016 Form Type Recipient Paper Patient DP Electronic Signature(s) Signed: 04/10/2016 8:32:31 AM By: Gwenlyn Perking Entered By: Gwenlyn Perking on 04/10/2016 08:32:31 Kerchner, [name] ([id number]) -------------------------------------------------------------------------------- Lower Extremity Assessment Details Patient Name: Richard Kyle A. Date of Service: 04/10/2016 8:00 AM Medical Record Number: [id number] Patient Account Number: [id number] Date of Birth/Sex: Jan 28, 1966 (50 y.o. Male) Treating RN: Afful, RN, BSN, [place] Sink Primary Care Physician: Angus Palms Other Clinician: Referring Physician: Angus Palms Treating Physician/Extender: Rudene Re in Treatment: 5 Electronic Signature(s) Signed: 04/10/2016 2:43:02 PM By: Elpidio Eric BSN, RN Entered By: Elpidio Eric on 04/10/2016 08:09:18 Auble, [name] Kitchen ([id number]) -------------------------------------------------------------------------------- Multi Wound Chart Details Patient Name: Richard Kyle A. Date of Service: 04/10/2016 8:00 AM Medical Record Number: [id number] Patient Account Number: [id number] Date of Birth/Sex: [birth year] (50 y.o. 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00%] [N/A:N/A] % Reduction in Volume: -50.00% [N/A:N/A] Classification: [3:Grade 2] [N/A:N/A] Exudate Amount: [3:Large] [N/A:N/A] Exudate Type: [3:Serosanguineous] [N/A:N/A] Exudate Color: [3:red, brown] [N/A:N/A] Foul Odor After [3:Yes] [N/A:N/A] Cleansing: Suchan, [name] ([id number]) Odor Anticipated Due to No N/A N/A Product Use: Wound Margin: Thickened N/A N/A Granulation Amount: Large (67-100%) N/A N/A Granulation Quality: Pink, Pale N/A N/A Necrotic Amount: Small (1-33%) N/A N/A Exposed Structures: Fascia: No N/A N/A Fat: No Tendon: No Muscle: No Joint: No Bone: No Limited to Skin Breakdown Epithelialization: None N/A N/A Periwound Skin Texture: Scarring: Yes N/A N/A Edema: No Excoriation: No Induration: No Callus: No Crepitus: No Fluctuance: No Friable: No Rash: No Periwound Skin Moist: Yes N/A N/A Moisture: Maceration: No Dry/Scaly: No Periwound Skin Color: Atrophie Blanche: No N/A N/A Cyanosis: No Ecchymosis: No Erythema: No Hemosiderin Staining: No Mottled: No Pallor: No Rubor: No Temperature: No Abnormality N/A N/A Tenderness on Yes N/A N/A Palpation: Wound Preparation: Ulcer Cleansing: N/A N/A Rinsed/Irrigated with Saline Topical Anesthetic Applied: Other:  lidocaine 4% Treatment Notes Electronic Signature(s) AMERIGO, MCGLORY ([id number]) Signed: 04/10/2016 2:43:02 PM By: Elpidio Eric BSN, RN Entered By: Elpidio Eric on 04/10/2016 08:17:02 Newbrough, Jens Som ([id number]) -------------------------------------------------------------------------------- Multi-Disciplinary Care Plan Details Patient Name: Richard Kyle A. Date of Service: 04/10/2016 8:00 AM Medical Record Number: [id number] Patient Account Number: [id number] Date of Birth/Sex: January 26, 1966 (50 y.o. Male) Treating RN: Afful, RN, BSN, [place] Sink Primary Care Physician: Angus Palms Other Clinician: Referring Physician: Angus Palms Treating Physician/Extender: Rudene Re in Treatment: 5 Active Inactive Abuse / Safety / Falls / Self Care Management Nursing Diagnoses: Knowledge deficit related to: safety; personal, health (wound), emergency Potential for falls Self care deficit: actual or potential Goals: Patient will remain injury free Date Initiated: 03/06/2016 Goal Status: Active Patient/caregiver will verbalize understanding of skin care regimen Date Initiated: 03/06/2016 Goal Status: Active Patient/caregiver will verbalize/demonstrate measure taken to improve self care Date Initiated: 03/06/2016 Goal Status: Active Patient/caregiver will verbalize/demonstrate measures taken to improve the patient's personal safety Date Initiated: 03/06/2016 Goal Status: Active Patient/caregiver will verbalize/demonstrate measures taken to prevent injury and/or falls Date Initiated: 03/06/2016 Goal Status: Active Patient/caregiver will verbalize/demonstrate understanding of what to do in case of emergency Date Initiated: 03/06/2016 Goal Status: Active Interventions: Assess fall risk on admission and as needed Assess: immobility, friction, shearing, incontinence upon admission and as needed Assess impairment of mobility on admission and as needed per policy Assess self care  needs on admission and as needed Patient referred to community resources (specify in notes) Provide education on basic hygiene Provide education on fall prevention Barrie, [name] ([id number]) Provide education on personal and home safety Provide education on safe transfers Treatment Activities: Education provided on Basic Hygiene : 03/06/2016 Notes: Orientation to the Wound Care Program Nursing Diagnoses: Knowledge deficit related to the wound healing center program Goals: Patient/caregiver will verbalize understanding of the Wound Healing Center Program Date Initiated: 03/06/2016 Goal Status: Active Interventions: Provide education on orientation to the wound center Notes: Pressure Nursing Diagnoses: Knowledge deficit related to causes and risk factors for pressure ulcer development Knowledge deficit related to management of pressures ulcers Potential for impaired tissue integrity related to pressure, friction, moisture, and shear Goals: Patient will remain free from development of additional pressure ulcers Date Initiated: 03/06/2016 Goal Status: Active Patient will remain free of pressure ulcers Date Initiated: 03/06/2016 Goal Status: Active Patient/caregiver will verbalize risk factors for pressure ulcer development Date Initiated: 03/06/2016 Goal Status: Active Patient/caregiver will verbalize understanding of pressure ulcer  management Date Initiated: 03/06/2016 Goal Status: Active Interventions: Assess: immobility, friction, shearing, incontinence upon admission and as needed Assess offloading mechanisms upon admission and as needed Poliquin, [name] ([id number]) Assess potential for pressure ulcer upon admission and as needed Provide education on pressure ulcers Notes: Wound/Skin Impairment Nursing Diagnoses: Impaired tissue integrity Knowledge deficit related to smoking impact on wound healing Knowledge deficit related to ulceration/compromised skin  integrity Goals: Patient/caregiver will verbalize understanding of skin care regimen Date Initiated: 03/06/2016 Goal Status: Active Ulcer/skin breakdown will have a volume reduction of 30% by week 4 Date Initiated: 03/06/2016 Goal Status: Active Ulcer/skin breakdown will have a volume reduction of 50% by week 8 Date Initiated: 03/06/2016 Goal Status: Active Ulcer/skin breakdown will have a volume reduction of 80% by week 12 Date Initiated: 03/06/2016 Goal Status: Active Ulcer/skin breakdown will heal within 14 weeks Date Initiated: 03/06/2016 Goal Status: Active Interventions: Assess patient/caregiver ability to obtain necessary supplies Assess patient/caregiver ability to perform ulcer/skin care regimen upon admission and as needed Assess ulceration(s) every visit Provide education on ulcer and skin care Treatment Activities: Referred to DME Arnecia Ector for dressing supplies : 03/06/2016 Skin care regimen initiated : 03/06/2016 Topical wound management initiated : 03/06/2016 Notes: Electronic Signature(s) Signed: 04/10/2016 2:43:02 PM By: Elpidio Eric BSN, RN Entered By: Elpidio Eric on 04/10/2016 08:11:59 Bark, Dinari A. ([id number]) Schopf, [name] Kitchen ([id number]) -------------------------------------------------------------------------------- Pain Assessment Details Patient Name: Richard Kyle A. Date of Service: 04/10/2016 8:00 AM Medical Record Number: [id number] Patient Account Number: [id number] Date of Birth/Sex: Sep 01, 1966 (50 y.o. Male) Treating RN: Afful, RN, BSN, [place] Sink Primary Care Physician: Angus Palms Other Clinician: Referring Physician: Angus Palms Treating Physician/Extender: Rudene Re in Treatment: 5 Active Problems Location of Pain Severity and Description of Pain Patient Has Paino No Site Locations Pain Management and Medication Current Pain Management: Electronic Signature(s) Signed: 04/10/2016 2:43:02 PM By: Elpidio Eric BSN, RN Entered  By: Elpidio Eric on 04/10/2016 08:04:05 Mckillop, [name] Kitchen ([id number]) -------------------------------------------------------------------------------- Patient/Caregiver Education Details Patient Name: Richard Kyle A. Date of Service: 04/10/2016 8:00 AM Medical Record Number: [id number] Patient Account Number: [id number] Date of Birth/Gender: 1966/05/06 (50 y.o. Male) Treating RN: Afful, RN, BSN, [place] Sink Primary Care Physician: Angus Palms Other Clinician: Referring Physician: Angus Palms Treating Physician/Extender: Rudene Re in Treatment: 5 Education Assessment Education Provided To: Patient Education Topics Provided Basic Hygiene: Methods: Explain/Verbal Responses: State content correctly Pressure: Methods: Explain/Verbal Responses: State content correctly Safety: Methods: Explain/Verbal Responses: State content correctly Welcome To The Wound Care Center: Methods: Explain/Verbal Responses: State content correctly Wound/Skin Impairment: Methods: Explain/Verbal Responses: State content correctly Electronic Signature(s) Signed: 04/10/2016 2:43:02 PM By: Elpidio Eric BSN, RN Entered By: Elpidio Eric on 04/10/2016 08:30:38 Camey, [name] Kitchen ([id number]) -------------------------------------------------------------------------------- Wound Assessment Details Patient Name: Richard Kyle A. Date of Service: 04/10/2016 8:00 AM Medical Record Number: [id number] Patient Account Number: [id number] Date of Birth/Sex: 09/15/1966 (50 y.o. 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 Wound Open Status: Wounding Event: Pressure Injury Comorbid Chronic sinus  problems/congestion, Date Acquired: 06/26/2015 History: Anemia, Asthma, Sleep Apnea, Coronary Weeks Of Treatment: 5 Artery Disease, Hypertension, Myocardial Clustered Wound: No Infarction, Type II Diabetes, History of pressure wounds, Gout, Neuropathy Photos Photo Uploaded By: Elpidio Eric on 04/10/2016 13:28:40 Wound Measurements Length: (cm) 6 Width: (cm) 9  Depth: (cm) 0.2 Area: (cm) 42.412 Volume: (cm) 8.482 % Reduction in Area: -50% % Reduction in Volume: -50% Epithelialization: None Tunneling: No Undermining: No Wound Description Classification: Grade 2 Foul Odor Aft Wound Margin: Thickened Due to Produc Exudate Amount: Large Exudate Type: Purulent Exudate Color: yellow, brown, green er Cleansing: Yes t Use: No Wound Bed Granulation Amount: Large (67-100%) Exposed Structure Granulation Quality: Pink, Pale Fascia Exposed: No Pettry, [name] ([id number]) Necrotic Amount: Small (1-33%) Fat Layer Exposed: No Necrotic Quality: Adherent Slough Tendon Exposed: No Muscle Exposed: No Joint Exposed: No Bone Exposed: No Limited to Skin Breakdown Periwound Skin Texture Texture Color No Abnormalities Noted: No No Abnormalities Noted: No Callus: No Atrophie Blanche: No Crepitus: No Cyanosis: No Excoriation: No Ecchymosis: No Fluctuance: No Erythema: No Friable: No Hemosiderin Staining: No Induration: No Mottled: No Localized Edema: No Pallor: No Rash: No Rubor: No Scarring: Yes Temperature / Pain Moisture Temperature: No Abnormality No Abnormalities Noted: No Tenderness on Palpation: Yes Dry / Scaly: No Maceration: Yes Moist: Yes Wound Preparation Ulcer Cleansing: Rinsed/Irrigated with Saline Topical Anesthetic Applied: Other: lidocaine 4%, Treatment Notes Wound #3 (Left, Posterior Amputation Site - Below Knee) 1. Cleansed with: Clean wound with Normal Saline 4. Dressing Applied: Aquacel Ag 5. Secondary Dressing Applied Dry  Gauze Kerlix/Conform 7. Secured with Tape Notes Armed forces operational officer) Signed: 04/10/2016 11:32:14 AM By: Elpidio Eric BSN, RN Entered By: Elpidio Eric on 04/10/2016 11:32:13 Lightsey, [name] ([id number]) Knoll, [name] Kitchen ([id number]) -------------------------------------------------------------------------------- Vitals Details Patient Name: Richard Kyle A. Date of Service: 04/10/2016 8:00 AM Medical Record Number: [id number] Patient Account Number: [id number] Date of Birth/Sex: 06/14/1966 (50 y.o. Male) Treating RN: Afful, RN, BSN, [place] Sink Primary Care Physician: Angus Palms Other Clinician: Referring Physician: Angus Palms Treating Physician/Extender: Rudene Re in Treatment: 5 Vital Signs Time Taken: 08:04 Temperature (F): 98.4 Height (in): 66 Pulse (bpm): 74 Weight (lbs): 280 Respiratory Rate (breaths/min): 18 Body Mass Index (BMI): 45.2 Blood Pressure (mmHg): 131/85 Reference Range: 80 - 120 mg / dl Electronic Signature(s) Signed: 04/10/2016 2:43:02 PM By: Elpidio Eric BSN, RN Entered By: Elpidio Eric on 04/10/2016 08:07:40

## 2016-04-11 DIAGNOSIS — L97509 Non-pressure chronic ulcer of other part of unspecified foot with unspecified severity: Secondary | ICD-10-CM | POA: Diagnosis not present

## 2016-04-11 DIAGNOSIS — Z125 Encounter for screening for malignant neoplasm of prostate: Secondary | ICD-10-CM | POA: Diagnosis not present

## 2016-04-11 DIAGNOSIS — E039 Hypothyroidism, unspecified: Secondary | ICD-10-CM | POA: Diagnosis not present

## 2016-04-11 DIAGNOSIS — E11621 Type 2 diabetes mellitus with foot ulcer: Secondary | ICD-10-CM | POA: Diagnosis not present

## 2016-04-11 DIAGNOSIS — E118 Type 2 diabetes mellitus with unspecified complications: Secondary | ICD-10-CM | POA: Diagnosis not present

## 2016-04-11 DIAGNOSIS — Z794 Long term (current) use of insulin: Secondary | ICD-10-CM | POA: Diagnosis not present

## 2016-04-16 DIAGNOSIS — Z89612 Acquired absence of left leg above knee: Secondary | ICD-10-CM | POA: Diagnosis not present

## 2016-04-23 DIAGNOSIS — L97919 Non-pressure chronic ulcer of unspecified part of right lower leg with unspecified severity: Secondary | ICD-10-CM | POA: Diagnosis not present

## 2016-04-25 ENCOUNTER — Ambulatory Visit: Payer: PPO | Admitting: *Deleted

## 2016-04-25 DIAGNOSIS — E1149 Type 2 diabetes mellitus with other diabetic neurological complication: Secondary | ICD-10-CM

## 2016-04-29 NOTE — Progress Notes (Signed)
Patient ID: [name], male   DOB: 1966/02/02, 50 y.o.   MRN: [id number] [facility] request due to him not being able to make any appointment time when the shoe measurements are completed in the [place] office.  Patient is measured and foam molded for diabetic shoes and inserts.  Patient saw that Dr Ardelle AntonWagoner was in the office and insisted that he come in and check his ulcer site.  Per Dr Ardelle AntonWagoner site looks healthy but he still needs to schedule an appointment for follow up care in the [place] office.  Front desk is notified and an appointment is scheduled for follow up care.  Patients shoes and inserts will be ordered and sent to the [place] office for pick up. Patient will be notified when they arrive.

## 2016-05-01 ENCOUNTER — Encounter: Payer: PPO | Attending: Surgery | Admitting: Surgery

## 2016-05-01 DIAGNOSIS — I739 Peripheral vascular disease, unspecified: Secondary | ICD-10-CM | POA: Insufficient documentation

## 2016-05-01 DIAGNOSIS — E1121 Type 2 diabetes mellitus with diabetic nephropathy: Secondary | ICD-10-CM | POA: Insufficient documentation

## 2016-05-01 DIAGNOSIS — E1140 Type 2 diabetes mellitus with diabetic neuropathy, unspecified: Secondary | ICD-10-CM | POA: Diagnosis not present

## 2016-05-01 DIAGNOSIS — L97122 Non-pressure chronic ulcer of left thigh with fat layer exposed: Secondary | ICD-10-CM | POA: Insufficient documentation

## 2016-05-01 DIAGNOSIS — L97411 Non-pressure chronic ulcer of right heel and midfoot limited to breakdown of skin: Secondary | ICD-10-CM | POA: Diagnosis not present

## 2016-05-01 DIAGNOSIS — M14671 Charcot's joint, right ankle and foot: Secondary | ICD-10-CM | POA: Insufficient documentation

## 2016-05-01 DIAGNOSIS — I2510 Atherosclerotic heart disease of native coronary artery without angina pectoris: Secondary | ICD-10-CM | POA: Insufficient documentation

## 2016-05-01 DIAGNOSIS — E039 Hypothyroidism, unspecified: Secondary | ICD-10-CM | POA: Diagnosis not present

## 2016-05-01 DIAGNOSIS — I10 Essential (primary) hypertension: Secondary | ICD-10-CM | POA: Insufficient documentation

## 2016-05-01 DIAGNOSIS — E11622 Type 2 diabetes mellitus with other skin ulcer: Secondary | ICD-10-CM | POA: Diagnosis not present

## 2016-05-01 DIAGNOSIS — I255 Ischemic cardiomyopathy: Secondary | ICD-10-CM | POA: Insufficient documentation

## 2016-05-01 DIAGNOSIS — M70852 Other soft tissue disorders related to use, overuse and pressure, left thigh: Secondary | ICD-10-CM | POA: Insufficient documentation

## 2016-05-01 DIAGNOSIS — L97412 Non-pressure chronic ulcer of right heel and midfoot with fat layer exposed: Secondary | ICD-10-CM | POA: Insufficient documentation

## 2016-05-01 DIAGNOSIS — G4730 Sleep apnea, unspecified: Secondary | ICD-10-CM | POA: Diagnosis not present

## 2016-05-02 NOTE — Progress Notes (Signed)
TARAS, RASK ([id number]) Visit Report for 05/01/2016 Chief Complaint Document Details Patient Name: [name], [name]. Date of Service: 05/01/2016 8:00 AM Medical Record Number: [id number] Patient Account Number: [id number] Date of Birth/Sex: [birth year] (50 y.o. Male) Treating RN: Afful, RN, BSN, [place] Sink Primary Care Physician: Angus Palms Other Clinician: Referring Physician: Angus Palms Treating Physician/Extender: Rudene Re in Treatment: 8 Information Obtained from: Patient Chief Complaint Patients presents for treatment of an open diabetic ulcer and pressure injury to his left lower thigh posteriorly for about [name] year. he is back after her recent consultation at [facility] outpatient prosthetic center Electronic Signature(s) Signed: 05/01/2016 9:14:28 AM By: Evlyn Kanner MD, FACS Entered By: Evlyn Kanner on 05/01/2016 09:14:28 Cashatt, Rajeev AMarland Kitchen ([id number]) -------------------------------------------------------------------------------- Debridement Details Patient Name: [name]. Date of Service: 05/01/2016 8:00 AM Medical Record Number: [id number] Patient Account Number: [id number] Date of Birth/Sex: [birth year] (50 y.o. Male) Treating RN: Afful, RN, BSN, [place] Sink Primary Care Physician: Angus Palms Other Clinician: Referring Physician: Angus Palms Treating Physician/Extender: Rudene Re in Treatment: 8 Debridement Performed for Wound #4 Right,Medial,Plantar Foot Assessment: Performed By: Physician Evlyn Kanner, MD Debridement: Debridement Pre-procedure Yes Verification/Time Out Taken: Start Time: 08:37 Pain Control: Lidocaine 4% Topical Solution Level: Skin/Subcutaneous Tissue Total Area Debrided (L x 1.5 (cm) x 1 (cm) = 1.5 (cm) W): Tissue and other Callus, Fibrin/Slough, Subcutaneous material debrided: Instrument: Curette Bleeding: Minimum Hemostasis Achieved: Pressure End Time: 08:42 Procedural Pain: 0 Post  Procedural Pain: 0 Response to Treatment: Procedure was tolerated well Post Debridement Measurements of Total Wound Length: (cm) 1.5 Width: (cm) 1 Depth: (cm) 0.1 Volume: (cm) 0.118 Post Procedure Diagnosis Same as Pre-procedure Electronic Signature(s) Signed: 05/01/2016 9:14:22 AM By: Evlyn Kanner MD, FACS Signed: 05/01/2016 4:15:52 PM By: Elpidio Eric BSN, RN Entered By: Evlyn Kanner on 05/01/2016 09:14:21 Schall, Fransisco [name]. ([id number]) -------------------------------------------------------------------------------- HPI Details Patient Name: [name]. Date of Service: 05/01/2016 8:00 AM Medical Record Number: [id number] Patient Account Number: [id number] Date of Birth/Sex: 24-Oct-1966 (50 y.o. Male) Treating RN: Afful, RN, BSN, [place] Sink Primary Care Physician: Angus Palms Other Clinician: Referring Physician: Angus Palms Treating Physician/Extender: Rudene Re in Treatment: 8 History of Present Illness Location: left lower thigh posteriorly Quality: Patient reports experiencing [name] dull pain to affected area(s). Severity: Patient states wound are getting worse. 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 HPI Description: 50 year old gentleman with past medical history of diabetes mellitus with last hemoglobin A1c done in February 2016 was 8.7, hypertension, hypothyroidism, coronary artery disease, ischemic cardiomyopathy , nephropathy, obesity, peripheral neuropathy, peripheral vascular disease and sleep apnea. 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 He has had problems with his left BKA prosthesis for [name] long while and had [name] vacuum system and then had to be changed over to [name] sleeve system and has been working with the orthopedic and prosthetic department at [facility]. He has also had [name] plastic surgery opinion in the past. The ulceration on the posterior part of his lower  thigh continued to be aggravating an enlarging and have [name] foul order and drainage. 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 Lubertha Basque the PA whonoted that he continue to wear his prosthesis against medical advice. details from his past surgical history notes that he's had [name] cardiac catheterization, coronary angioplasty, amputation of the left leg in 2012, debridement of skin and subcutis tissue in June 2013, application of wound VAC at that time, below knee amputation revision in April 2014. Recommendation was for [name] new socket to be made which should be above the new wound. He also recommended surgical revision of the redundant skin and to stay off the prosthesis to the wound heals but the patient would not be able to do this because of work. Hence he was advised to establish with [name] local wound clinic for management of his current wound and follow-up with the amputee clinic at [facility] or with Dr. Mindi Curling. If he decided to go back to them for surgery he should see Dr. Lanier Ensign. 03/27/2016 -- he was unable to get his prosthesis made at the place where they had begun to mold and hence he has to find himself [name] mother prosthetic department and is working on this. 04/10/2016 -- on his left popliteal fossa area he has got [name] small satellite abrasion with an  ulceration which was caused by pulling off the socket on his skin. He still has to see his prosthetic department. ZOHAR, MARONEY ([id number]) 05/01/2016 -- today he comes with [name] new problem on his right foot which he says he's had for about 2 months and has been treated by his podiatrist Dr. Vaughan Sine. Says due to the fact that his footwear and insoles were for now he has developed an ulcerated area which continues to Cipro sanguinous fluid has been on doxycycline for about 2 months. He saw Dr. Loreta Ave who did an x-ray of the foot and has been following up with him for [name] long while regarding this and managing his Charcot's arthropathy right foot. Today the patient wanted me to take [name] look at it and agrees to undergo treatment for this Electronic Signature(s) Signed: 05/01/2016 9:15:36 AM By: Evlyn Kanner MD, FACS Entered By: Evlyn Kanner on 05/01/2016 09:15:36 Dull, Derius AMarland Kitchen ([id number]) -------------------------------------------------------------------------------- Physical Exam Details Patient Name: [name]. Date of Service: 05/01/2016 8:00 AM Medical Record Number: [id number] Patient Account Number: [id number] Date of Birth/Sex: August 14, 1966 (50 y.o. Male) Treating RN: Afful, RN, BSN, [place] Sink Primary Care Physician: Angus Palms Other Clinician: Referring Physician: Angus Palms Treating Physician/Extender: Rudene Re in Treatment: 8 Constitutional . Pulse regular. Respirations normal and unlabored. Afebrile. . Eyes Nonicteric. Reactive to light. Ears, Nose, Mouth, and Throat Lips, teeth, and gums WNL.Marland Kitchen Moist mucosa without lesions. Neck supple and nontender. No palpable supraclavicular or cervical adenopathy. Normal sized without goiter. Respiratory WNL. No retractions.. Breath sounds WNL, No rubs, rales, rhonchi, or wheeze.. Cardiovascular Heart rhythm and rate regular, no murmur or gallop.. Pedal Pulses WNL. No clubbing, cyanosis or  edema. Lymphatic No adneopathy. No adenopathy. No adenopathy. Musculoskeletal Adexa without tenderness or enlargement.. Digits and nails w/o clubbing, cyanosis, infection, petechiae, ischemia, or inflammatory conditions.. Integumentary (Hair, Skin) No suspicious lesions. No crepitus or fluctuance. No peri-wound warmth or erythema. No masses.Marland Kitchen Psychiatric Judgement and insight Intact.Marland Kitchen  No evidence of depression, anxiety, or agitation.. Notes the ulcerated area on the left posterior thigh continues to be fairly significant but smaller. There is some lichenification of skin on the edges but there is no gross inflammation. The new wound on the right plantar aspect in the mid foot area has [name] lot of callus buildup and there is depth to it and there is seropurulent material draining from this. Using [name] #3 curet I have sharply debrided all the surrounding callus and freshened some of the edges and this does not probe down to bone. Bleeding controlled with pressure. Electronic Signature(s) Signed: 05/01/2016 9:16:49 AM By: Evlyn KannerBritto, Risa Auman MD, FACS Entered By: Evlyn KannerBritto, Waver Dibiasio on 05/01/2016 09:16:49 Elbaum, Jens SomARREN [name]. ([id number]) -------------------------------------------------------------------------------- Physician Orders Details Patient Name: Richard KylePHILLIPS, Bayron [name]. Date of Service: 05/01/2016 8:00 AM Medical Record Number: [id number] Patient Account Number: [id number] Date of Birth/Sex: July 31, 1966 (50 y.o. Male) Treating RN: Afful, RN, BSN, [place] Sinkita Primary Care Physician: Angus PalmsGEORGE, SIONNE Other Clinician: Referring Physician: Angus PalmsGEORGE, SIONNE Treating Physician/Extender: Rudene ReBritto, Hira Trent Weeks in Treatment: 8 Verbal / Phone Orders: Yes Clinician: Afful, RN, BSN, Rita Read Back and Verified: Yes Diagnosis Coding Wound Cleansing Wound #3 Left,Posterior Amputation Site - Below Knee o Cleanse wound with mild soap and water o May Shower, gently pat wound dry prior to applying new dressing. o May  shower with protection. Wound #4 Right,Medial,Plantar Foot o Cleanse wound with mild soap and water o May Shower, gently pat wound dry prior to applying new dressing. o May shower with protection. Anesthetic Wound #3 Left,Posterior Amputation Site - Below Knee o Topical Lidocaine 4% cream applied to wound bed prior to debridement Wound #4 Right,Medial,Plantar Foot o Topical Lidocaine 4% cream applied to wound bed prior to debridement Skin Barriers/Peri-Wound Care Wound #3 Left,Posterior Amputation Site - Below Knee o Barrier cream Wound #4 Right,Medial,Plantar Foot o Barrier cream Primary Wound Dressing Wound #3 Left,Posterior Amputation Site - Below Knee o Aquacel Ag Wound #4 Right,Medial,Plantar Foot o Aquacel Ag Secondary Dressing Wound #3 Left,Posterior Amputation Site - Below Knee o Gauze and Kerlix/Conform Palazzola, Legend [name]. ([id number]) Wound #4 Right,Medial,Plantar Foot o Gauze and Kerlix/Conform Dressing Change Frequency Wound #3 Left,Posterior Amputation Site - Below Knee o Change dressing every day. Wound #4 Right,Medial,Plantar Foot o Change dressing every day. Follow-up Appointments Wound #3 Left,Posterior Amputation Site - Below Knee o Return Appointment in 1 week. Off-Loading Wound #4 Right,Medial,Plantar Foot o Other: - Felt Additional Orders / Instructions Wound #3 Left,Posterior Amputation Site - Below Knee o Increase protein intake. o OK to return to work o OK to return to work with the following restrictions: o Activity as tolerated o Other: - Vitamin [name], C, [employer], MVI Wound #4 Right,Medial,Plantar Foot o Increase protein intake. o OK to return to work o OK to return to work with the following restrictions: o Activity as tolerated o Other: - Vitamin [name], C, [employer], MVI Electronic Signature(s) Signed: 05/01/2016 3:21:55 PM By: Evlyn KannerBritto, Garrett Bowring MD, FACS Signed: 05/01/2016 4:15:52 PM By: Elpidio EricAfful, Rita BSN,  RN Entered By: Elpidio EricAfful, Rita on 05/01/2016 08:43:26 Lajeunesse, Shannan AMarland Kitchen. ([id number]) -------------------------------------------------------------------------------- Problem List Details Patient Name: Richard KylePHILLIPS, Forney [name]. Date of Service: 05/01/2016 8:00 AM Medical Record Number: [id number] Patient Account Number: [id number] Date of Birth/Sex: July 31, 1966 (50 y.o. 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 ([id number]) -------------------------------------------------------------------------------- Progress Note Details Patient Name: [name]. Date of Service: 05/01/2016 8:00 AM Medical Record Number: [id number] Patient Account Number: [id number] Date of Birth/Sex: September 09, 1966 (50 y.o. Male) Treating RN: Afful, RN, BSN, [place] Sink Primary Care Physician: Angus Palms Other Clinician: Referring Physician: Angus Palms Treating Physician/Extender: Rudene Re in Treatment: 8 Subjective Chief Complaint Information obtained from Patient Patients presents for treatment of an open diabetic ulcer  and pressure injury to his left lower thigh posteriorly for about [name] year. he is back after her recent consultation at [facility] outpatient prosthetic center History of Present Illness (HPI) The following HPI elements were documented for the patient's wound: Location: left lower thigh posteriorly Quality: Patient reports experiencing [name] dull pain to affected area(s). Severity: Patient states wound are getting worse. Duration: Patient has had the wound for > 11 months prior to seeking treatment at the wound center Timing: Pain in wound is Intermittent (comes and goes Context: The wound occurred when the patient has been wearing [name] prosthesis on his left lower extremity for [name] left BKA Modifying Factors: Other treatment(s) tried include:working with the prosthetic and orthopedic department at [facility] Associated Signs and Symptoms: Patient reports having foul odor. 50 year old gentleman with past medical history of diabetes mellitus with last hemoglobin A1c done in February 2016 was 8.7, hypertension, hypothyroidism, coronary artery disease, ischemic cardiomyopathy , nephropathy, obesity, peripheral neuropathy, peripheral vascular disease and sleep apnea. 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 He has had problems with his left BKA prosthesis for [name] long while and had [name] vacuum system and then had to be changed over to [name] sleeve system and has been working with the orthopedic and prosthetic department at [facility]. He has also had [name] plastic surgery opinion in the past. The ulceration on the posterior part of his lower thigh continued to be aggravating an enlarging and have [name] foul order and drainage. 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 Lubertha Basque the PA whonoted that he continue to wear his prosthesis against medical advice. details from his past surgical history notes that he's had [name] cardiac catheterization, coronary angioplasty, amputation of the left leg in 2012, debridement of skin and subcutis tissue in June 2013, application of wound VAC at that time, below knee amputation revision in April 2014. Weida, Maziah [name]. ([id number]) Recommendation was for [name] new socket to be made which should be above the new wound. He also recommended surgical revision of the redundant skin and to stay off the prosthesis to the wound heals but the patient would not be able to do this because of work. Hence he  was advised to establish with [name] local wound clinic for management of his current wound and follow-up with the amputee clinic at [facility] or with Dr. Mindi Curling. If he decided to go back to them for surgery he should see Dr. Lanier Ensign. 03/27/2016 -- he was unable to get his prosthesis made at the place where they had begun to mold and hence he has to find himself [name] mother prosthetic department and is working on this. 04/10/2016 -- on his left popliteal fossa area he has got [name] small satellite abrasion with an ulceration which was caused by pulling off the socket on his skin. He still has to see his prosthetic department. 05/01/2016 -- today he comes with [name] new problem on his right foot which he says he's had for about 2 months and has been treated by his podiatrist Dr. Vaughan Sine. Says due to the fact that his footwear and insoles were for now he has developed an ulcerated area which continues to Cipro sanguinous fluid has been on doxycycline for about 2 months. He saw Dr. Loreta Ave who did an x-ray of the foot and has been following up with him for [name] long while regarding this and managing his Charcot's arthropathy right foot. Today the patient wanted me to take [name] look at it  and agrees to undergo treatment for this Objective Constitutional Pulse regular. Respirations normal and unlabored. Afebrile. Vitals Time Taken: 8:11 AM, Height: 66 in, Weight: 280 lbs, BMI: 45.2, Temperature: 98.2 F, Pulse: 70 bpm, Respiratory Rate: 18 breaths/min, Blood Pressure: 147/60 mmHg. Eyes Nonicteric. Reactive to light. Ears, Nose, Mouth, and Throat Lips, teeth, and gums WNL.Marland Kitchen Moist mucosa without lesions. Neck supple and nontender. No palpable supraclavicular or cervical adenopathy. Normal sized without goiter. Respiratory WNL. No retractions.. Breath sounds WNL, No rubs, rales, rhonchi, or wheeze.. Cardiovascular Heart rhythm and rate regular, no murmur or gallop.. Pedal Pulses WNL. No clubbing, cyanosis or edema. Lymphatic No adneopathy. No adenopathy. No adenopathy. Musculoskeletal Aye, Kavish [name]. ([id number]) Adexa without tenderness or enlargement.. Digits and nails w/o clubbing, cyanosis, infection, petechiae, ischemia, or inflammatory conditions.Marland Kitchen Psychiatric Judgement and insight Intact.. No evidence of depression, anxiety, or agitation.. General Notes: the ulcerated area on the left posterior thigh continues to be fairly significant but smaller. There is some lichenification of skin on the edges but there is no gross inflammation. The new wound on the right plantar aspect in the mid foot area has [name] lot of callus buildup and there is depth to it and there is seropurulent material draining from this. Using [name] #3 curet I have sharply debrided all the surrounding callus and freshened some of the edges and this does not probe down to bone. Bleeding controlled with pressure. Integumentary (Hair, Skin) No suspicious lesions. No crepitus or fluctuance. No peri-wound warmth or erythema. No masses.. Wound #3 status is Open. Original cause of wound was Pressure Injury. The wound is located on the Left,Posterior Amputation Site - Below Knee. The wound measures 5cm length x  7.4cm width x 0.2cm depth; 29.06cm^2 area and 5.812cm^3 volume. The wound is limited to skin breakdown. There is no tunneling or undermining noted. There is [name] large amount of purulent drainage noted. The wound margin is thickened. There is large (67-100%) pink, pale granulation within the wound bed. There is [name] small (1-33%) amount of necrotic tissue within the wound bed including Adherent Slough. The periwound skin appearance exhibited: Scarring, Maceration, Moist. The periwound skin appearance did not exhibit: Callus, Crepitus, Excoriation, Fluctuance, Friable, Induration, Localized  Edema, Rash, Dry/Scaly, Atrophie Blanche, Cyanosis, Ecchymosis, Hemosiderin Staining, Mottled, Pallor, Rubor, Erythema. Periwound temperature was noted as No Abnormality. The periwound has tenderness on palpation. Wound #4 status is Open. Original cause of wound was Gradually Appeared. The wound is located on the Right,Medial,Plantar Foot. The wound measures 1.5cm length x 1cm width x 0.1cm depth; 1.178cm^2 area and 0.118cm^3 volume. The wound is limited to skin breakdown. There is no tunneling or undermining noted. There is [name] large amount of serosanguineous drainage noted. The wound margin is distinct with the outline attached to the wound base. There is medium (34-66%) granulation within the wound bed. There is [name] medium (34-66%) amount of necrotic tissue within the wound bed including Adherent Slough. The periwound skin appearance exhibited: Callus, Dry/Scaly. The periwound skin appearance did not exhibit: Crepitus, Excoriation, Fluctuance, Friable, Induration, Localized Edema, Rash, Scarring, Maceration, Moist, Atrophie Blanche, Cyanosis, Ecchymosis, Hemosiderin Staining, Mottled, Pallor, Rubor, Erythema. Periwound temperature was noted as No Abnormality. 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 ([id number]) L97.412 - Non-pressure chronic ulcer of right heel and midfoot with fat layer exposed M14.671 - Charcot's joint, right ankle and foot This gentleman was type 2 diabetes mellitus and has been rather nonchalant about his left lower extremity prostheses informs me that he is unable to get the new socket made for at least another month and is going to be going to [facility]. We will outline local care as before and offloading has been discussed in great detail. Far as the new wound on the right foot goes he agreed after much discussion to let me debride the wound and the surrounding callus and says he will continue to see Dr. Loreta Ave for x-rays and other management of the Charcot's arthropathy. I have recommended silver alginate packing, felt [name] ring around it and to try his best to offload this foot as well as possible and work with his prosthetic department. The patient understands the importance of proper offloading but does not seem to have much resource not as he seemed to be greatly concerned about this and I have cautioned him into understanding that if this gets any worse he may get [name] bone infection and the possibility of him having prolonged treatment for this. He acknowledges my discussion and says he will be compliant including his diabetic control, protein intake and vitamins. Procedures Wound #4 Wound #4 is [name] Diabetic Wound/Ulcer of the Lower Extremity located on the Right,Medial,Plantar Foot . There was [name] Skin/Subcutaneous Tissue Debridement ([id number]) debridement with total area of 1.5 sq cm performed by Evlyn Kanner, MD. with the following instrument(s): Curette including Fibrin/Slough, Callus, and Subcutaneous after achieving pain control using Lidocaine 4% Topical Solution. [name] time out  was conducted prior to the start of the procedure. [name] Minimum amount of bleeding was controlled with Pressure. The procedure was tolerated well with [name] pain level of 0 throughout and [name] pain level of 0 following the procedure. Post Debridement Measurements: 1.5cm length x 1cm width x 0.1cm depth; 0.118cm^3 volume. Post procedure Diagnosis Wound #4: Same as Pre-Procedure Plan Wound Cleansing: Wound #3 Left,Posterior Amputation Site - Below Knee: Cleanse wound with mild soap and water May Shower, gently pat wound dry prior to applying new dressing. May shower with  protection. Wound #4 Right,Medial,Plantar Foot: [place]ora, Norlan [name]. ([id number]) Cleanse wound with mild soap and water May Shower, gently pat wound dry prior to applying new dressing. May shower with protection. Anesthetic: Wound #3 Left,Posterior Amputation Site - Below Knee: Topical Lidocaine 4% cream applied to wound bed prior to debridement Wound #4 Right,Medial,Plantar Foot: Topical Lidocaine 4% cream applied to wound bed prior to debridement Skin Barriers/Peri-Wound Care: Wound #3 Left,Posterior Amputation Site - Below Knee: Barrier cream Wound #4 Right,Medial,Plantar Foot: Barrier cream Primary Wound Dressing: Wound #3 Left,Posterior Amputation Site - Below Knee: Aquacel Ag Wound #4 Right,Medial,Plantar Foot: Aquacel Ag Secondary Dressing: Wound #3 Left,Posterior Amputation Site - Below Knee: Gauze and Kerlix/Conform Wound #4 Right,Medial,Plantar Foot: Gauze and Kerlix/Conform Dressing Change Frequency: Wound #3 Left,Posterior Amputation Site - Below Knee: Change dressing every day. Wound #4 Right,Medial,Plantar Foot: Change dressing every day. Follow-up Appointments: Wound #3 Left,Posterior Amputation Site - Below Knee: Return Appointment in 1 week. Off-Loading: Wound #4 Right,Medial,Plantar Foot: Other: - Felt Additional Orders / Instructions: Wound #3 Left,Posterior Amputation Site - Below  Knee: Increase protein intake. OK to return to work OK to return to work with the following restrictions: Activity as tolerated Other: - Vitamin [name], C, [employer], MVI Wound #4 Right,Medial,Plantar Foot: Increase protein intake. OK to return to work OK to return to work with the following restrictions: Activity as tolerated Other: - Vitamin [name], C, [employer], MVI Kavanagh, Tegan [name]. ([id number]) This gentleman was type 2 diabetes mellitus and has been rather nonchalant about his left lower extremity prostheses informs me that he is unable to get the new socket made for at least another month and is going to be going to [facility]. We will outline local care as before and offloading has been discussed in great detail. Far as the new wound on the right foot goes he agreed after much discussion to let me debride the wound and the surrounding callus and says he will continue to see Dr. Loreta Ave for x-rays and other management of the Charcot's arthropathy. I have recommended silver alginate packing, felt [name] ring around it and to try his best to offload this foot as well as possible and work with his prosthetic department. The patient understands the importance of proper offloading but does not seem to have much resource not as he seemed to be greatly concerned about this and I have cautioned him into understanding that if this gets any worse he may get [name] bone infection and the possibility of him having prolonged treatment for this. He acknowledges my discussion and says he will be compliant including his diabetic control, protein intake and vitamins. Electronic Signature(s) Signed: 05/01/2016 9:19:07 AM By: Evlyn Kanner MD, FACS Entered By: Evlyn Kanner on 05/01/2016 09:19:07 Reaves, Beth AMarland Kitchen ([id number]) -------------------------------------------------------------------------------- SuperBill Details Patient Name: [name]. Date of Service: 05/01/2016 Medical Record Number:  [id number] Patient Account Number: [id number] Date of Birth/Sex: 1966-09-04 (50 y.o. 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 M14.671 Charcot's joint, right ankle and foot Facility Procedures CPT4 Code Description: [id number] -  DEB SUBQ TISSUE 20 SQ CM/< ICD-10 Description Diagnosis E11.622 Type 2 diabetes mellitus with other skin ulcer L97.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 ([id number]) Modifier: with fat lay Quantity: 1 er exposed Electronic Signature(s) Signed: 05/01/2016 9:19:34 AM By: Evlyn Kanner MD, FACS Entered By: Evlyn Kanner on 05/01/2016 09:19:34

## 2016-05-02 NOTE — Progress Notes (Signed)
FELIX, MERAS ([id number]) Visit Report for 05/01/2016 Arrival Information Details Patient Name: Richard [name], Richard [name]. Date of Service: 05/01/2016 8:00 AM Medical Record Number: [id number] Patient Account Number: [id number] Date of Birth/Sex: 01-16-1966 (50 y.o. Male) Treating RN: Afful, RN, BSN, [place] Sink Primary Care Physician: Angus Palms Other Clinician: Referring Physician: Angus Palms Treating Physician/Extender: Rudene Re in Treatment: 8 Visit Information History Since Last Visit Added or deleted any medications: No Patient Arrived: Ambulatory Any new allergies or adverse reactions: No Arrival Time: 08:08 Had [name] fall or experienced change in No Accompanied By: self activities of daily living that may affect Transfer Assistance: None risk of falls: Patient Identification Verified: Yes Signs or symptoms of abuse/neglect since No Secondary Verification Process Yes last visito Completed: Hospitalized since last visit: No Patient Requires Transmission-Based No Has Dressing in Place as Prescribed: Yes Precautions: Has Footwear/Offloading in Place as Yes Patient Has Alerts: No Prescribed: Left: Other:Prosthetic Pain Present Now: No Electronic Signature(s) Signed: 05/01/2016 4:15:52 PM By: Elpidio Eric BSN, RN Entered By: Elpidio Eric on 05/01/2016 08:10:55 [place], Quaron [name] Kitchen ([id number]) -------------------------------------------------------------------------------- Encounter Discharge Information Details Patient Name: Richard [name]. Date of Service: 05/01/2016 8:00 AM Medical Record Number: [id number] Patient Account Number: [id number] Date of Birth/Sex: Jun 05, 1966 (50 y.o. Male) Treating RN: Afful, RN, BSN, [place] Sink Primary Care Physician: Angus Palms Other Clinician: Referring Physician: Angus Palms Treating Physician/Extender: Rudene Re in Treatment: 8 Encounter Discharge Information Items Discharge Pain Level: 0 Discharge Condition:  Stable Ambulatory Status: Ambulatory Discharge Destination: Home Transportation: Private Auto Accompanied By: self Schedule Follow-up Appointment: No Medication Reconciliation completed and provided to Patient/Care No Falcon Mccaskey: Provided on Clinical Summary of Care: 05/01/2016 Form Type Recipient Paper Patient DP Electronic Signature(s) Signed: 05/01/2016 9:03:39 AM By: Gwenlyn Perking Entered By: Gwenlyn Perking on 05/01/2016 09:03:39 Gerber, Alonso [name]. ([id number]) -------------------------------------------------------------------------------- Lower Extremity Assessment Details Patient Name: Richard [name]. Date of Service: 05/01/2016 8:00 AM Medical Record Number: [id number] Patient Account Number: [id number] Date of Birth/Sex: 21-Jan-1966 (50 y.o. Male) Treating RN: Afful, RN, BSN, [place] Sink Primary Care Physician: Angus Palms Other Clinician: Referring Physician: Angus Palms Treating Physician/Extender: Rudene Re in Treatment: 8 Electronic Signature(s) Signed: 05/01/2016 4:15:52 PM By: Elpidio Eric BSN, RN Entered By: Elpidio Eric on 05/01/2016 08:13:34 Richard [name], Richard [name] Kitchen ([id number]) -------------------------------------------------------------------------------- Multi Wound Chart Details Patient Name: Richard [name]. Date of Service: 05/01/2016 8:00 AM Medical Record Number: [id number] Patient Account Number: [id number] Date of Birth/Sex: Dec 10, 1965 (50 y.o. Male) Treating RN: Afful, RN, BSN, [place] Sink Primary Care Physician: Angus Palms Other Clinician: Referring Physician: Angus Palms Treating Physician/Extender: Rudene Re in Treatment: 8 Vital Signs Height(in): 66 Pulse(bpm): 70 Weight(lbs): 280 Blood Pressure 147/60 (mmHg): Body Mass Index(BMI): 45 Temperature(F): 98.2 Respiratory Rate 18 (breaths/min): Photos: [3:No Photos] [4:No Photos] [N/[name]:N/[name]] Wound Location: [3:Left Amputation Site - Below Knee - Posterior] [4:Right Foot -  Plantar, Medial] [N/[name]:N/[name]] Wounding Event: [3:Pressure Injury] [4:Gradually Appeared] [N/[name]:N/[name]] 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] [4:the Lower Extremity] Comorbid History: [3:Chronic sinus problems/congestion, Anemia, Asthma, Sleep Anemia, Asthma, Sleep Apnea, Coronary Artery Apnea, Coronary Artery Disease, Hypertension, Myocardial Infarction, Type II Diabetes, History Type II Diabetes, History of  pressure wounds, Gout, of pressure wounds, Gout, Neuropathy] [4:Chronic sinus problems/congestion, Disease, Hypertension, Myocardial Infarction, Neuropathy] [N/[name]:N/[name]] Date Acquired: [3:06/26/2015] [4:03/28/2016] [N/[name]:N/[name]] Weeks of Treatment: [3:8] [4:0] [N/[name]:N/[name]] Wound Status: [3:Open] [4:Open] [N/[name]:N/[name]] Measurements Richard [name] x W x D 5x7.4x0.2 [4:1.5x1x0.1] [N/[name]:N/[name]] (cm) Area (cm) : [3:29.06] [4:1.178] [N/[name]:N/[name]] Volume (cm) : [3:5.812] [4:0.118] [N/[name]:N/[name]] % Reduction  in Area: [3:-2.80%] [4:N/[name]] [N/[name]:N/[name]] % Reduction in Volume: -2.80% [4:N/[name]] [N/[name]:N/[name]] Classification: [3:Grade 2] [4:Grade 1] [N/[name]:N/[name]] Exudate Amount: [3:Large] [4:Large] [N/[name]:N/[name]] Exudate Type: [3:Purulent] [4:Serosanguineous] [N/[name]:N/[name]] Exudate Color: [3:yellow, brown, green] [4:red, brown] [N/[name]:N/[name]] Foul Odor After [3:Yes] [4:Yes] [N/[name]:N/[name]] Cleansing: Mill[place]in, Branndon [name]. ([id number]) Odor Anticipated Due to No No N/[name] Product Use: Wound Margin: Thickened Distinct, outline attached N/[name] Granulation Amount: Large (67-100%) Medium (34-66%) N/[name] Granulation Quality: Pink, Pale N/[name] N/[name] Necrotic Amount: Small (1-33%) Medium (34-66%) N/[name] Exposed Structures: Fascia: No Fascia: No N/[name] Fat: No Fat: No Tendon: No Tendon: No Muscle: No Muscle: No Joint: No Joint: No Bone: No Bone: No Limited to Skin Limited to Skin Breakdown Breakdown Epithelialization: None None N/[name] Periwound Skin Texture: Scarring: Yes Callus: Yes N/[name] Edema: No Edema: No Excoriation: No Excoriation:  No Induration: No Induration: No Callus: No Crepitus: No Crepitus: No Fluctuance: No Fluctuance: No Friable: No Friable: No Rash: No Rash: No Scarring: No Periwound Skin Maceration: Yes Dry/Scaly: Yes N/[name] Moisture: Moist: Yes Maceration: No Dry/Scaly: No Moist: No Periwound Skin Color: Atrophie Blanche: No Atrophie Blanche: No N/[name] Cyanosis: No Cyanosis: No Ecchymosis: No Ecchymosis: No Erythema: No Erythema: No Hemosiderin Staining: No Hemosiderin Staining: No Mottled: No Mottled: No Pallor: No Pallor: No Rubor: No Rubor: No Temperature: No Abnormality No Abnormality N/[name] Tenderness on Yes No N/[name] Palpation: Wound Preparation: Ulcer Cleansing: Ulcer Cleansing: N/[name] Rinsed/Irrigated with Rinsed/Irrigated with Saline Saline Topical Anesthetic Topical Anesthetic Applied: Other: lidocaine Applied: Other: Lidocaine 4% 4% Treatment Notes Electronic Signature(s) Richard [name], Richard [name] ([id number]) Signed: 05/01/2016 4:15:52 PM By: Elpidio Eric BSN, RN Entered By: Elpidio Eric on 05/01/2016 08:25:42 Richard [name], Richard [name] ([id number]) -------------------------------------------------------------------------------- Multi-Disciplinary Care Plan Details Patient Name: Richard [name]. Date of Service: 05/01/2016 8:00 AM Medical Record Number: [id number] Patient Account Number: [id number] Date of Birth/Sex: [birth year] (50 y.o. Male) Treating RN: Afful, RN, BSN, [place] Sink Primary Care Physician: Angus Palms Other Clinician: Referring Physician: Angus Palms Treating Physician/Extender: Rudene Re in Treatment: 8 Active Inactive Abuse / Safety / Falls / Self Care Management Nursing Diagnoses: Knowledge deficit related to: safety; personal, health (wound), emergency Potential for falls Self care deficit: actual or potential Goals: Patient will remain injury free Date Initiated: 03/06/2016 Goal Status: Active Patient/caregiver will verbalize understanding of skin  care regimen Date Initiated: 03/06/2016 Goal Status: Active Patient/caregiver will verbalize/demonstrate measure taken to improve self care Date Initiated: 03/06/2016 Goal Status: Active Patient/caregiver will verbalize/demonstrate measures taken to improve the patient's personal safety Date Initiated: 03/06/2016 Goal Status: Active Patient/caregiver will verbalize/demonstrate measures taken to prevent injury and/or falls Date Initiated: 03/06/2016 Goal Status: Active Patient/caregiver will verbalize/demonstrate understanding of what to do in case of emergency Date Initiated: 03/06/2016 Goal Status: Active Interventions: Assess fall risk on admission and as needed Assess: immobility, friction, shearing, incontinence upon admission and as needed Assess impairment of mobility on admission and as needed per policy Assess self care needs on admission and as needed Patient referred to community resources (specify in notes) Provide education on basic hygiene Provide education on fall prevention Bronaugh, Dyland [name]. ([id number]) Provide education on personal and home safety Provide education on safe transfers Treatment Activities: Education provided on Basic Hygiene : 04/10/2016 Notes: Orientation to the Wound Care Program Nursing Diagnoses: Knowledge deficit related to the wound healing center program Goals: Patient/caregiver will verbalize understanding of the Wound Healing Center Program Date Initiated: 03/06/2016 Goal Status: Active Interventions: Provide education on orientation to the wound center Notes: Pressure Nursing Diagnoses: Knowledge deficit related to causes and  risk factors for pressure ulcer development Knowledge deficit related to management of pressures ulcers Potential for impaired tissue integrity related to pressure, friction, moisture, and shear Goals: Patient will remain free from development of additional pressure ulcers Date Initiated: 03/06/2016 Goal Status:  Active Patient will remain free of pressure ulcers Date Initiated: 03/06/2016 Goal Status: Active Patient/caregiver will verbalize risk factors for pressure ulcer development Date Initiated: 03/06/2016 Goal Status: Active Patient/caregiver will verbalize understanding of pressure ulcer management Date Initiated: 03/06/2016 Goal Status: Active Interventions: Assess: immobility, friction, shearing, incontinence upon admission and as needed Assess offloading mechanisms upon admission and as needed Richard [name], Richard [name]. ([id number]) Assess potential for pressure ulcer upon admission and as needed Provide education on pressure ulcers Notes: Wound/Skin Impairment Nursing Diagnoses: Impaired tissue integrity Knowledge deficit related to smoking impact on wound healing Knowledge deficit related to ulceration/compromised skin integrity Goals: Patient/caregiver will verbalize understanding of skin care regimen Date Initiated: 03/06/2016 Goal Status: Active Ulcer/skin breakdown will have [name] volume reduction of 30% by week 4 Date Initiated: 03/06/2016 Goal Status: Active Ulcer/skin breakdown will have [name] volume reduction of 50% by week 8 Date Initiated: 03/06/2016 Goal Status: Active Ulcer/skin breakdown will have [name] volume reduction of 80% by week 12 Date Initiated: 03/06/2016 Goal Status: Active Ulcer/skin breakdown will heal within 14 weeks Date Initiated: 03/06/2016 Goal Status: Active Interventions: Assess patient/caregiver ability to obtain necessary supplies Assess patient/caregiver ability to perform ulcer/skin care regimen upon admission and as needed Assess ulceration(s) every visit Provide education on ulcer and skin care Treatment Activities: Referred to DME Nikos Anglemyer for dressing supplies : 03/06/2016 Skin care regimen initiated : 03/06/2016 Topical wound management initiated : 03/06/2016 Notes: Electronic Signature(s) Signed: 05/01/2016 4:15:52 PM By: Elpidio Eric BSN, RN Entered By:  Elpidio Eric on 05/01/2016 08:25:32 Richard [name], Richard [name]. ([id number]) Richard [name], Richard [name] Kitchen ([id number]) -------------------------------------------------------------------------------- Pain Assessment Details Patient Name: Richard [name]. Date of Service: 05/01/2016 8:00 AM Medical Record Number: [id number] Patient Account Number: [id number] Date of Birth/Sex: Mar 09, 1966 (50 y.o. Male) Treating RN: Afful, RN, BSN, [place] Sink Primary Care Physician: Angus Palms Other Clinician: Referring Physician: Angus Palms Treating Physician/Extender: Rudene Re in Treatment: 8 Active Problems Location of Pain Severity and Description of Pain Patient Has Paino No Site Locations With Dressing Change: No Pain Management and Medication Current Pain Management: Electronic Signature(s) Signed: 05/01/2016 4:15:52 PM By: Elpidio Eric BSN, RN Entered By: Elpidio Eric on 05/01/2016 08:11:01 Lagace, Shawnta [name] Kitchen ([id number]) -------------------------------------------------------------------------------- Patient/Caregiver Education Details Patient Name: Richard [name]. Date of Service: 05/01/2016 8:00 AM Medical Record Number: [id number] Patient Account Number: [id number] Date of Birth/Gender: 1966/02/13 (50 y.o. Male) Treating RN: Afful, RN, BSN, [place] Sink Primary Care Physician: Angus Palms Other Clinician: Referring Physician: Angus Palms Treating Physician/Extender: Rudene Re in Treatment: 8 Education Assessment Education Provided To: Patient Education Topics Provided Basic Hygiene: Methods: Explain/Verbal Responses: State content correctly Pressure: Methods: Explain/Verbal Responses: State content correctly Safety: Methods: Explain/Verbal Responses: State content correctly Welcome To The Wound Care Center: Methods: Explain/Verbal Responses: State content correctly Wound/Skin Impairment: Methods: Explain/Verbal Responses: State content correctly Electronic  Signature(s) Signed: 05/01/2016 3:57:20 PM By: Elpidio Eric BSN, RN Entered By: Elpidio Eric on 05/01/2016 15:57:19 Biber, Socorro [name]. ([id number]) -------------------------------------------------------------------------------- Wound Assessment Details Patient Name: Richard [name]. Date of Service: 05/01/2016 8:00 AM Medical Record Number: [id number] Patient Account Number: [id number] Date of Birth/Sex: 08/30/1966 (50 y.o. 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 Wound Open Status: Wounding Event: Pressure Injury Comorbid Chronic sinus problems/congestion, Date Acquired: 06/26/2015 History: Anemia, Asthma, Sleep Apnea, Coronary Weeks Of Treatment: 8 Artery Disease, Hypertension, Myocardial Clustered Wound: No Infarction, Type II Diabetes, History of pressure wounds, Gout, Neuropathy Photos Photo Uploaded By: Elpidio Eric on 05/01/2016 16:13:45 Wound Measurements Length: (cm) 5 Width: (cm) 7.4 Depth: (cm) 0.2 Area: (cm) 29.06 Volume: (cm) 5.812 % Reduction in Area: -2.8% % Reduction in Volume: -2.8% Epithelialization: None Tunneling: No Undermining: No Wound Description Classification: Grade 2 Foul Odor Aft Wound Margin: Thickened Due to Produc Exudate Amount: Large Exudate Type: Purulent Exudate Color: yellow, brown, green er Cleansing: Yes t Use: No Wound Bed Granulation Amount: Large (67-100%) Exposed Structure Granulation Quality: Pink, Pale Fascia Exposed: No Richard [name], Richard [name]. ([id number]) Necrotic Amount: Small (1-33%) Fat Layer Exposed: No Necrotic Quality: Adherent Slough Tendon Exposed: No Muscle Exposed: No Joint Exposed: No Bone Exposed:  No Limited to Skin Breakdown Periwound Skin Texture Texture Color No Abnormalities Noted: No No Abnormalities Noted: No Callus: No Atrophie Blanche: No Crepitus: No Cyanosis: No Excoriation: No Ecchymosis: No Fluctuance: No Erythema: No Friable: No Hemosiderin Staining: No Induration: No Mottled: No Localized Edema: No Pallor: No Rash: No Rubor: No Scarring: Yes Temperature / Pain Moisture Temperature: No Abnormality No Abnormalities Noted: No Tenderness on Palpation: Yes Dry / Scaly: No Maceration: Yes Moist: Yes Wound Preparation Ulcer Cleansing: Rinsed/Irrigated with Saline Topical Anesthetic Applied: Other: lidocaine 4%, Treatment Notes Wound #3 (Left, Posterior Amputation Site - Below Knee) 1. Cleansed with: Clean wound with Normal Saline 4. Dressing Applied: Aquacel Ag 5. Secondary Dressing Applied Dry Gauze Kerlix/Conform 7. Secured with Tape Notes Armed forces operational officer) Signed: 05/01/2016 4:15:52 PM By: Elpidio Eric BSN, RN Entered By: Elpidio Eric on 05/01/2016 08:15:47 Richard [name], Richard [name]. ([id number]) Richard [name], Richard [name] Kitchen ([id number]) -------------------------------------------------------------------------------- Wound Assessment Details Patient Name: Richard [name]. Date of Service: 05/01/2016 8:00 AM Medical Record Number: [id number] Patient Account Number: [id number] Date of Birth/Sex: June 30, 1966 (50 y.o. Male) Treating RN: Afful, RN, BSN, Rita Primary Care Physician: Angus Palms Other Clinician: Referring Physician: Angus Palms Treating Physician/Extender: Rudene Re in Treatment: 8 Wound Status Wound Number: 4 Primary Diabetic Wound/Ulcer of the Lower Etiology: Extremity Wound Location: Right Foot - Plantar, Medial Wound Open Wounding Event: Gradually Appeared Status: Date Acquired: 03/28/2016 Comorbid Chronic sinus problems/congestion, Weeks Of Treatment: 0 History: Anemia, Asthma, Sleep Apnea,  Coronary Clustered Wound: No Artery Disease, Hypertension, Myocardial Infarction, Type II Diabetes, History of pressure wounds, Gout, Neuropathy Photos Photo Uploaded By: Elpidio Eric on 05/01/2016 16:14:24 Wound Measurements Length: (cm) 1.5 Width: (cm) 1 Depth: (cm) 0.1 Area: (cm) 1.178 Volume: (cm) 0.118 % Reduction in Area: % Reduction in Volume: Epithelialization: None Tunneling: No Undermining: No Wound Description Classification: Grade 1 Foul Odor Aft Wound Margin: Distinct, outline attached Due to Produc Exudate Amount: Large Exudate Type: Serosanguineous Exudate Color: red, brown er Cleansing: Yes t Use: No Wound Bed Granulation Amount: Medium (34-66%) Exposed Structure Necrotic Amount: Medium (34-66%) Fascia Exposed: No Richard [name], Richard [name]. ([id number]) Necrotic Quality: Adherent Slough Fat Layer Exposed: No Tendon Exposed: No Muscle Exposed: No Joint Exposed: No Bone Exposed: No Limited to Skin Breakdown Periwound Skin Texture Texture Color No Abnormalities Noted: No No Abnormalities Noted: No Callus: Yes Atrophie Blanche: No Crepitus: No Cyanosis: No Excoriation: No Ecchymosis: No Fluctuance: No Erythema: No Friable: No Hemosiderin  Staining: No Induration: No Mottled: No Localized Edema: No Pallor: No Rash: No Rubor: No Scarring: No Temperature / Pain Moisture Temperature: No Abnormality No Abnormalities Noted: No Dry / Scaly: Yes Maceration: No Moist: No Wound Preparation Ulcer Cleansing: Rinsed/Irrigated with Saline Topical Anesthetic Applied: Other: Lidocaine 4%, Treatment Notes Wound #4 (Right, Medial, Plantar Foot) 1. Cleansed with: Clean wound with Normal Saline 4. Dressing Applied: Aquacel Ag 5. Secondary Dressing Applied Dry Gauze Kerlix/Conform 7. Secured with Tape Notes Armed forces operational officerxtrasorb Electronic Signature(s) Signed: 05/01/2016 4:15:52 PM By: Elpidio EricAfful, Rita BSN, RN Entered By: Elpidio EricAfful, Rita on 05/01/2016 08:20:57 Hinchliffe,  Tanis [name] Kitchen. ([id number]) Busser, Qadir [name] Kitchen. ([id number]) -------------------------------------------------------------------------------- Vitals Details Patient Name: Richard KylePHILLIPS, Dossie [name]. Date of Service: 05/01/2016 8:00 AM Medical Record Number: [id number] Patient Account Number: [id number] Date of Birth/Sex: [birth year] (50 y.o. Male) Treating RN: Afful, RN, BSN, [place] Sinkita Primary Care Physician: Angus PalmsGEORGE, SIONNE Other Clinician: Referring Physician: Angus PalmsGEORGE, SIONNE Treating Physician/Extender: Rudene ReBritto, Errol Weeks in Treatment: 8 Vital Signs Time Taken: 08:11 Temperature (F): 98.2 Height (in): 66 Pulse (bpm): 70 Weight (lbs): 280 Respiratory Rate (breaths/min): 18 Body Mass Index (BMI): 45.2 Blood Pressure (mmHg): 147/60 Reference Range: 80 - 120 mg / dl Electronic Signature(s) Signed: 05/01/2016 4:15:52 PM By: Elpidio EricAfful, Rita BSN, RN Entered By: Elpidio EricAfful, Rita on 05/01/2016 08:11:19

## 2016-05-06 ENCOUNTER — Encounter: Payer: Self-pay | Admitting: Podiatry

## 2016-05-06 ENCOUNTER — Ambulatory Visit (INDEPENDENT_AMBULATORY_CARE_PROVIDER_SITE_OTHER): Payer: PPO | Admitting: Podiatry

## 2016-05-06 DIAGNOSIS — M79675 Pain in left toe(s): Secondary | ICD-10-CM

## 2016-05-06 DIAGNOSIS — B351 Tinea unguium: Secondary | ICD-10-CM

## 2016-05-06 DIAGNOSIS — E1149 Type 2 diabetes mellitus with other diabetic neurological complication: Secondary | ICD-10-CM | POA: Diagnosis not present

## 2016-05-06 DIAGNOSIS — M79676 Pain in unspecified toe(s): Secondary | ICD-10-CM

## 2016-05-06 DIAGNOSIS — L97511 Non-pressure chronic ulcer of other part of right foot limited to breakdown of skin: Secondary | ICD-10-CM | POA: Diagnosis not present

## 2016-05-06 DIAGNOSIS — L97919 Non-pressure chronic ulcer of unspecified part of right lower leg with unspecified severity: Secondary | ICD-10-CM | POA: Diagnosis not present

## 2016-05-06 MED ORDER — CIPROFLOXACIN HCL 500 MG PO TABS: 500.0000 mg | ORAL_TABLET | Freq: Two times a day (BID) | ORAL | Status: DC

## 2016-05-06 NOTE — Progress Notes (Signed)
Patient ID: [name], male   DOB: [birth year], 50 y.o.   MRN: [id number]  Subjective: 50 year old male presents the office today for follow-up evaluation of right foot ulceration. He came to the office a couple weeks ago to have his feet measured for diabetic shoes. At that time he stated the wound was still open and he had some drainage to the area and therefore he presents today for follow-up. He states of the wound care center started to look at this foot last week in nature and the wound. He is on doxycycline but would like to add more coverage. He denies any redness or warmth. No pus. No swelling to his foot.  Denies any redness or red streaks. No previous treatment. Denies any systemic complaints such as fevers, chills, nausea, vomiting. No acute changes since last appointment, and no other complaints at this time.   Objective: AAO x3, NAD Indication left leg DP/PT pulses palpable, CRT less than 3 seconds Protective sensation decreased with Simms Weinstein monofilament On the plantar aspect of the right midfoot is a wound with hyperkeratotic periwound. Today the wound measures 1.5 x 0.5 x 2.2 cm. There is, hyperkeratotic periwound have this was recently debrided by wound care. There is no drainage or pus. No probing to bone, undermining or tunneling. Follow-up with deformity is evident with Charcot changes present. No other open lesions on the right foot. There is a mild increase in warmth to the right foot however there is no erythema or ascending synovitis. Nails on the right foot are hypertrophic, dystrophic, discolored and elongated. Subjective there is tenderness nails 1-5 on the right foot. No redness or drainage. No areas of pinpoint bony tenderness or pain with vibratory sensation. No edema, erythema, increase in warmth to the right lower extremities.  No open lesions or pre-ulcerative lesions.  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 Ulceration right plantar foot  Plan: -All treatment options discussed with the patient including all alternatives, risks, complications.  -At this time continue the wound care follow-up. Continue daily dressing changes per wound care. Will start ciprofloxacin as a very mild increase in warmth the right foot. Continue doxycycline as well. He states that he continue to follow up with the wound care center for the wound. Because of this out of for further treatment to the wound care center and I'll see him back in 3 months for nail trimming or sooner if any issues are to arise. Decrease this plan. Nails debrided 5 without couple complications or bleeding.-  Ovid CurdMatthew Tenna Lacko, DPM

## 2016-05-07 DIAGNOSIS — Z89619 Acquired absence of unspecified leg above knee: Secondary | ICD-10-CM | POA: Diagnosis not present

## 2016-05-08 ENCOUNTER — Encounter: Payer: PPO | Admitting: Surgery

## 2016-05-08 DIAGNOSIS — E11622 Type 2 diabetes mellitus with other skin ulcer: Secondary | ICD-10-CM | POA: Diagnosis not present

## 2016-05-08 DIAGNOSIS — L97122 Non-pressure chronic ulcer of left thigh with fat layer exposed: Secondary | ICD-10-CM | POA: Diagnosis not present

## 2016-05-08 DIAGNOSIS — L89899 Pressure ulcer of other site, unspecified stage: Secondary | ICD-10-CM | POA: Diagnosis not present

## 2016-05-08 DIAGNOSIS — M14671 Charcot's joint, right ankle and foot: Secondary | ICD-10-CM | POA: Diagnosis not present

## 2016-05-08 DIAGNOSIS — L97411 Non-pressure chronic ulcer of right heel and midfoot limited to breakdown of skin: Secondary | ICD-10-CM | POA: Diagnosis not present

## 2016-05-08 NOTE — Progress Notes (Addendum)
THURMAN, SARVER ([id number]) Visit Report for 05/08/2016 Chief Complaint Document Details Patient Name: [name], [name] Date of Service: 05/08/2016 8:45 AM Medical Record Number: [id number] Patient Account Number: [id number] Date of Birth/Sex: [birth year] (50 yNormano. Male) Treating RN: Bing Matter Primary Care Physician: Angus Palms Other Clinician: Referring Physician: Angus Palms Treating Physician/Extender: Rudene Re in Treatment: 9 Information Obtained from: Patient Chief Complaint Patients presents for treatment of an open diabetic ulcer and pressure injury to his left lower thigh posteriorly for about a year. he is back after her recent consultation at [facility] outpatient prosthetic center Electronic Signature(s) Signed: 05/08/2016 10:20:01 AM By: Evlyn Kanner MD, FACS Entered By: Evlyn Kanner on 05/08/2016 10:20:01 [name], [name] ([id number]) -------------------------------------------------------------------------------- Debridement Details Patient Name: Richard Kyle A. Date of Service: 05/08/2016 8:45 AM Medical Record Number: [id number] Patient Account Number: [id number] Date of Birth/Sex: 1965/12/23 (50 yNormano. Male) Treating RN: Bing Matter Primary Care Physician: Angus Palms Other Clinician: Referring Physician: Angus Palms Treating Physician/Extender: Rudene Re in Treatment: 9 Debridement Performed for Wound #4 Right,Medial,Plantar Foot Assessment: Performed By: Physician Evlyn Kanner, MD Debridement: Debridement Pre-procedure Yes Verification/Time Out Taken: Start Time: 10:08 Pain Control: Other : lidocaine 4% cream Level: Skin/Subcutaneous Tissue Total Area Debrided (L x 1[name]5 (cm) x 0[name]5 (cm) = 0[name]75 (cm) W): Tissue and other Viable, Non-Viable, Fibrin/Slough, Skin, Subcutaneous material debrided: Instrument: Curette Bleeding: Minimum Hemostasis Achieved: Silver Nitrate End Time: 10:13 Procedural Pain:  4 Post Procedural Pain: 2 Response to Treatment: Procedure was tolerated well Post Debridement Measurements of Total Wound Length: (cm) 1[name]5 Width: (cm) 0[name]5 Depth: (cm) 0[name]6 Volume: (cm) 0[name]353 Post Procedure Diagnosis Same as Pre-procedure Notes a large amount of surrounding callus was sharply debrided and then the ulcer base was debrided down to the subcutaneous tissue and brisk bleeding controlled with pressure and silver nitrate. Electronic Signature(s) Signed: 05/08/2016 10:19:48 AM By: Evlyn Kanner MD, FACS Signed: 05/08/2016 5:30:07 PM By: Bing Matter Entered By: Evlyn Kanner on 05/08/2016 10:19:48 [name], [name] ([id number]) [name], Richard AMarland Kitchen ([id number]) -------------------------------------------------------------------------------- HPI Details Patient Name: Richard Kyle A. Date of Service: 05/08/2016 8:45 AM Medical Record Number: [id number] Patient Account Number: [id number] Date of Birth/Sex: Oct 08, 1966 (50 yNormano. Male) Treating RN: Phillis Haggis Primary Care Physician: Angus Palms Other Clinician: Referring Physician: Angus Palms Treating Physician/Extender: Rudene Re in Treatment: 9 History of Present Illness Location: left lower thigh posteriorly Quality: Patient reports experiencing a dull pain to affected area(s). Severity: Patient states wound are getting worse. 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 HPI Description: 50 year old gentleman with past medical history of diabetes mellitus with last hemoglobin A1c done in  February 2016 was 8[name]7, hypertension, hypothyroidism, coronary artery disease, ischemic cardiomyopathy , nephropathy, obesity, peripheral neuropathy, peripheral vascular disease and sleep apnea. 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 He has had problems with his left BKA prosthesis for a long while and had a vacuum system and then had to be changed over to a sleeve system and  has [facility] He has also had a plastic surgery opinion in the past. The ulceration on the posterior part of his lower thigh continued to be aggravating an enlarging and have a foul order and drainage. 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 Lubertha BasqueJennifer Dye the PA whonoted that he continue to wear his prosthesis against medical advice. details from his past surgical history notes that he's had a cardiac catheterization, coronary angioplasty, amputation of the left leg in 2012, debridement of skin and subcutis tissue in June 2013, application of wound VAC at that time, below knee amputation revision in April 2014. Recommendation was for a new socket to be made which should be above the new wound. He also recommended surgical revision of the redundant skin and to stay off the prosthesis to the wound heals but the patient would not be able to do this because of work. Hence he was advised to establish with a local wound clinic for management of his current wound and follow-up with the [facility]putee [facility] or with Dr. Mindi Curlingawney. If he decided to go back to them for surgery he should see Dr. Lanier Ensigneilley. 03/27/2016 -- he was unable to get his prosthesis  made at the place where they had begun to mold and hence he has to find himself a mother prosthetic department and is working on this. 04/10/2016 -- on his left popliteal fossa area he has got a small satellite abrasion with an ulceration which was caused by pulling off the socket on his skin. He still has to see his prosthetic department. [name], [name] ([id number]) 05/01/2016 -- today he comes with a new problem on his right foot which he says he's had for about 2 months and has been treated by his podiatrist Dr. Ovid CurdMatthew Wagoner. Says due to the fact that his footwear and insoles were worn out, he has developed an ulcerated area which continues to drain sero- sanguinous fluid and has been on doxycycline for about 2 months. He saw Dr. Ardelle AntonWagoner who did an x-ray of the foot and has been following up with him for a long while regarding this and managing his Charcot's arthropathy right foot. Today the patient wanted me to take a look at it and agrees to undergo treatment for this. 05/08/2016 -- I have reviewed notes from Dr. Vaughan SineMatthew Wagner who saw him on 05/06/2016. after review he asked him to continue with local wound care and started him on ciprofloxacin as well as his doxycycline. note is made of the fact that on his previous visit on 02/01/2016 x-rays were done which showed chronic arthritic changes present in the rear foot due to Charcot deformity. Vessel occification is present. Electronic Signature(s) Signed: 05/08/2016 10:20:14 AM By: Evlyn KannerBritto, Jawanna Dykman MD, FACS Previous Signature: 05/08/2016 9:22:10 AM Version By: Evlyn KannerBritto, Zahlia Deshazer MD, FACS Previous Signature: 05/08/2016 9:14:14 AM Version By: Evlyn KannerBritto, Joniel Graumann MD, FACS Entered By: Evlyn KannerBritto, Johnatha Zeidman on 05/08/2016 10:20:13 [name], Richard AMarland Kitchen. ([id number]) -------------------------------------------------------------------------------- Physical Exam Details Patient Name: Richard KylePHILLIPS, Mirza A. Date of Service: 05/08/2016 8:45 AM Medical Record Number:  [id number] Patient Account Number: [id number] Date of Birth/Sex: 1966/09/04 (50 yNormano. Male) Treating RN: Bing MatterSlack, Angela Primary Care Physician: Angus PalmsGEORGE, SIONNE Other Clinician: Referring Physician: Angus PalmsGEORGE, SIONNE Treating Physician/Extender: Rudene ReBritto, Kenyonna Micek Weeks in  Treatment: 9 Constitutional . Pulse regular. Respirations normal and unlabored. Afebrile. . Eyes Nonicteric. Reactive to light. Ears, Nose, Mouth, and Throat Lips, teeth, and gums WNLNormanMarland Kitchen Moist mucosa without lesions. Neck supple and nontender. No palpable supraclavicular or cervical adenopathy. Normal sized without goiter. Respiratory WNL. No retractions.. Cardiovascular Pedal Pulses WNL. No clubbing, cyanosis or edema. Lymphatic No adneopathy. No adenopathy. No adenopathy. Musculoskeletal Adexa without tenderness or enlargement.. Digits and nails w/o clubbing, cyanosis, infection, petechiae, ischemia, or inflammatory conditions.. Integumentary (Hair, Skin) No suspicious lesions. No crepitus or fluctuance. No peri-wound warmth or erythema. No massesNormanMarland Kitchen Psychiatric Judgement and insight Intact.. No evidence of depression, anxiety, or agitation.. Notes I was start the ulcerated area on the left posterior thigh with more saline gauze and remove some of the surrounding debris which easily comes out. The base of the ulcer is looking clean. The wound on the right plantar aspect of the midfoot area has significant amount of surrounding callus and I have debrided this with a #3 curet and got down to saucerized the wound with healthy granulation tissue, which bled briskly and bleeding was controlled with Silver nitrate. Electronic Signature(s) Signed: 05/08/2016 10:22:38 AM By: Evlyn Kanner MD, FACS Entered By: Evlyn Kanner on 05/08/2016 10:22:37 [name], [name] ([id number]) -------------------------------------------------------------------------------- Physician Orders Details Patient Name: Richard Kyle A. Date of  Service: 05/08/2016 8:45 AM Medical Record Number: [id number] Patient Account Number: [id number] Date of Birth/Sex: 12-02-1965 (50 yNormano. Male) Treating RN: Bing Matter Primary Care Physician: Angus Palms Other Clinician: Referring Physician: Angus Palms Treating Physician/Extender: Rudene Re in Treatment: 9 Verbal / Phone Orders: Yes Clinician: Bing Matter Read Back and Verified: Yes Diagnosis Coding Wound Cleansing Wound #3 Left,Posterior Amputation Site - Below Knee o Cleanse wound with mild soap and water o May Shower, gently pat wound dry prior to applying new dressing. o May shower with protection. Wound #4 Right,Medial,Plantar Foot o Cleanse wound with mild soap and water o May Shower, gently pat wound dry prior to applying new dressing. o May shower with protection. Anesthetic Wound #3 Left,Posterior Amputation Site - Below Knee o Topical Lidocaine 4% cream applied to wound bed prior to debridement Wound #4 Right,Medial,Plantar Foot o Topical Lidocaine 4% cream applied to wound bed prior to debridement Skin Barriers/Peri-Wound Care Wound #3 Left,Posterior Amputation Site - Below Knee o Barrier cream Wound #4 Right,Medial,Plantar Foot o Barrier cream Primary Wound Dressing Wound #3 Left,Posterior Amputation Site - Below Knee o Aquacel Ag Wound #4 Right,Medial,Plantar Foot o Aquacel Ag Secondary Dressing Wound #3 Left,Posterior Amputation Site - Below Knee o Gauze and Kerlix/Conform [name], [name] ([id number]) Wound #4 Right,Medial,Plantar Foot o Gauze and Kerlix/Conform Dressing Change Frequency Wound #3 Left,Posterior Amputation Site - Below Knee o Change dressing every day. Wound #4 Right,Medial,Plantar Foot o Change dressing every day. Follow-up Appointments Wound #3 Left,Posterior Amputation Site - Below Knee o Return Appointment in 1 week. Off-Loading Wound #4 Right,Medial,Plantar Foot o Other: -  Felt Additional Orders / Instructions Wound #3 Left,Posterior Amputation Site - Below Knee o Increase protein intake. o OK to return to work o OK to return to work with the following restrictions: o Activity as tolerated o Other: - Vitamin A, C, [employer], MVI Wound #4 Right,Medial,Plantar Foot o Increase protein intake. o OK to return to work o OK to return to work with the following restrictions: o Activity as tolerated o Other: - Vitamin A, C, [employer], MVI Electronic Signature(s) Signed: 05/08/2016 4:59:18 PM By: Evlyn Kanner MD, FACS Signed: 05/08/2016 5:30:07 PM  By: Bing Matter Entered By: Bing Matter on 05/08/2016 10:16:31 [name], Richard AMarland Kitchen ([id number]) -------------------------------------------------------------------------------- Problem List Details Patient Name: Richard Kyle A. Date of Service: 05/08/2016 8:45 AM Medical Record Number: [id number] Patient Account Number: [id number] Date of Birth/Sex: May 24, 1966 (50 yNormano. 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  ([id number]) -------------------------------------------------------------------------------- Progress Note Details Patient Name: Richard Kyle A. Date of Service: 05/08/2016 8:45 AM Medical Record Number: [id number] Patient Account Number: [id number] Date of Birth/Sex: 01-05-1966 (50 yNormano. Male) Treating RN: Bing Matter Primary Care Physician: Angus Palms Other Clinician: Referring Physician: Angus Palms Treating Physician/Extender: Rudene Re in Treatment: 9 Subjective Chief Complaint Information obtained from Patient Patients presents for treatment of an open diabetic ulcer and pressure injury to his left lower thigh posteriorly for about a year. he is back after her recent consultation at [facility] outpatient prosthetic center History of Present Illness (HPI) The following HPI elements were documented for the patient's wound: Location: left lower thigh posteriorly Quality: Patient reports experiencing a dull pain to affected area(s). Severity: Patient states wound are getting worse. 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 51 year old gentleman with past medical history of diabetes mellitus with last hemoglobin A1c done in February 2016 was 8[name]7, hypertension, hypothyroidism, coronary artery disease, ischemic cardiomyopathy , nephropathy, obesity, peripheral neuropathy, peripheral vascular disease and sleep apnea. 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 He has had problems with his left BKA prosthesis for a long while and had a vacuum system and then had to be changed over to a sleeve system and has been working with the orthopedic and prosthetic department at [facility]. He has also had a plastic surgery opinion in the past. The ulceration on the posterior part of his lower thigh continued to be aggravating an enlarging and have a foul order and drainage. 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 Lubertha Basque the PA whonoted that he continue to wear his prosthesis against medical advice. details from his past surgical history notes that he's had a cardiac catheterization, coronary angioplasty, amputation  of the left leg in 2012, debridement of skin and subcutis tissue in June 2013, application of wound VAC at that time, below knee amputation revision in April 2014. Wesenberg, Cotey A. ([id number]) Recommendation was for a new socket to be made which should be above the new wound. He also recommended surgical revision of the redundant skin and to stay off the prosthesis to the wound heals but the patient would not be able to do this because of work. Hence he was advised to establish with a local wound clinic for management of his current wound and follow-up with the amputee clinic at [facility] or with Dr. Mindi Curling. If he decided to go back to them for surgery he should see Dr. Lanier Ensign. 03/27/2016 -- he was unable to get his prosthesis made at the place where they had begun to mold and hence he has to find himself a mother prosthetic department and is working on this. 04/10/2016 -- on his left popliteal fossa area he has got a small satellite abrasion with an ulceration which was caused by pulling off the socket on his skin. He  still has to see his prosthetic department. 05/01/2016 -- today he comes with a new problem on his right foot which he says he's had for about 2 months and has been treated by his podiatrist Dr. Ovid Curd. Says due to the fact that his footwear and insoles were worn out, he has developed an ulcerated area which continues to drain sero- sanguinous fluid and has been on doxycycline for about 2 months. He saw Dr. Ardelle Anton who did an x-ray of the foot and has been following up with him for a long while regarding this and managing his Charcot's arthropathy right foot. Today the patient wanted me to take a look at it and agrees to undergo treatment for this. 05/08/2016 -- I have reviewed notes from Dr. Vaughan Sine who saw him on 05/06/2016. after review he asked him to continue with local wound care and started him on ciprofloxacin as well as his doxycycline. note is made of the fact that on his previous visit on 02/01/2016 x-rays were done which showed chronic arthritic changes present in the rear foot due to Charcot deformity. Vessel occification is present. Objective Constitutional Pulse regular. Respirations normal and unlabored. Afebrile. Vitals Time Taken: 9:38 AM, Height: 66 in, Source: Stated, Weight: 280 lbs, Source: Stated, BMI: 45[name]2, Temperature: 98[name]3 F, Pulse: 69 bpm, Respiratory Rate: 16 breaths/min, Blood Pressure: 127/81 mmHg. Eyes Nonicteric. Reactive to light. Ears, Nose, Mouth, and Throat Lips, teeth, and gums WNLNormanMarland Kitchen Moist mucosa without lesions. Neck supple and nontender. No palpable supraclavicular or cervical adenopathy. Normal sized without goiter. Respiratory WNL. No retractions.. Cardiovascular Pedal Pulses WNL. No clubbing, cyanosis or edema. Byrum, Gagan A. ([id number]) Lymphatic No adneopathy. No adenopathy. No adenopathy. Musculoskeletal Adexa without tenderness or enlargement.. Digits and nails w/o clubbing, cyanosis, infection, petechiae, ischemia,  or inflammatory conditionsNormanMarland Kitchen Psychiatric Judgement and insight Intact.. No evidence of depression, anxiety, or agitation.. General Notes: I was start the ulcerated area on the left posterior thigh with more saline gauze and remove some of the surrounding debris which easily comes out. The base of the ulcer is looking clean. The wound on the right plantar aspect of the midfoot area has significant amount of surrounding callus and I have debrided this with a #3 curet and got down to saucerized the wound with healthy granulation tissue, which bled briskly and bleeding was controlled with Silver nitrate. Integumentary (Hair, Skin) No suspicious  lesions. No crepitus or fluctuance. No peri-wound warmth or erythema. No masses.. Wound #3 status is Open. Original cause of wound was Pressure Injury. The wound is located on the Left,Posterior Amputation Site - Below Knee. The wound measures 4[name]3cm length x 7[name]4cm width x 0[name]2cm depth; 24[name]991cm^2 area and 4[name]998cm^3 volume. The wound is limited to skin breakdown. There is no tunneling or undermining noted. There is a large amount of purulent drainage noted. The wound margin is thickened. There is large (67-100%) pink granulation within the wound bed. There is a small (1-33%) amount of necrotic tissue within the wound bed including Adherent Slough. The periwound skin appearance exhibited: Scarring, Maceration, Moist. The periwound skin appearance did not exhibit: Callus, Crepitus, Excoriation, Fluctuance, Friable, Induration, Localized Edema, Rash, Dry/Scaly, Atrophie Blanche, Cyanosis, Ecchymosis, Hemosiderin Staining, Mottled, Pallor, Rubor, Erythema. Periwound temperature was noted as No Abnormality. The periwound has tenderness on palpation. Wound #4 status is Open. Original cause of wound was Gradually Appeared. The wound is located on the Right,Medial,Plantar Foot. The wound measures 1[name]5cm length x 0[name]5cm width x 0[name]5cm depth; 0[name]589cm^2 area and 0[name]295cm^3  volume. The wound is limited to skin breakdown. There is no tunneling or undermining noted. There is a medium amount of serosanguineous drainage noted. The wound margin is thickened. There is large (67-100%) red granulation within the wound bed. There is no necrotic tissue within the wound bed. The periwound skin appearance exhibited: Callus, Dry/Scaly. The periwound skin appearance did not exhibit: Crepitus, Excoriation, Fluctuance, Friable, Induration, Localized Edema, Rash, Scarring, Maceration, Moist, Atrophie Blanche, Cyanosis, Ecchymosis, Hemosiderin Staining, Mottled, Pallor, Rubor, Erythema. Periwound temperature was noted as No Abnormality. Assessment Active Problems ICD-10 E11[name]622 - Type 2 diabetes mellitus with other skin ulcer Ammirati, Khoen A. ([id number]) W09[name]811 - Non-pressure chronic ulcer of left thigh with fat layer exposed M70[name]852 - Other soft tissue disorders related to use, overuse and pressure, left thigh E66[name]01 - Morbid (severe) obesity due to excess calories L97[name]412 - Non-pressure chronic ulcer of right heel and midfoot with fat layer exposed M14[name]671 - Charcot's joint, right ankle and foot Procedures Wound #4 Wound #4 is a Diabetic Wound/Ulcer of the Lower Extremity located on the Right,Medial,Plantar Foot . There was a Skin/Subcutaneous Tissue Debridement ([id number]) debridement with total area of 0[name]75 sq cm performed by Evlyn Kanner, MD. with the following instrument(s): Curette to remove Viable and Non- Viable tissue/material including Fibrin/Slough, Skin, and Subcutaneous after achieving pain control using Other (lidocaine 4% cream). A time out was conducted prior to the start of the procedure. A Minimum amount of bleeding was controlled with Silver Nitrate. The procedure was tolerated well with a pain level of 4 throughout and a pain level of 2 following the procedure. Post Debridement Measurements: 1[name]5cm length x 0[name]5cm width x 0[name]6cm depth; 0[name]353cm^3  volume. Post procedure Diagnosis Wound #4: Same as Pre-Procedure General Notes: a large amount of surrounding callus was sharply debrided and then the ulcer base was debrided down to the subcutaneous tissue and brisk bleeding controlled with pressure and silver nitrate.. Plan Wound Cleansing: Wound #3 Left,Posterior Amputation Site - Below Knee: Cleanse wound with mild soap and water May Shower, gently pat wound dry prior to applying new dressing. May shower with protection. Wound #4 Right,Medial,Plantar Foot: Cleanse wound with mild soap and water May Shower, gently pat wound dry prior to applying new dressing. May shower with protection. Anesthetic: Wound #3 Left,Posterior Amputation Site - Below Knee: Topical Lidocaine 4% cream applied to wound bed prior to debridement Wound #4 Right,Medial,Plantar Foot:  Topical Lidocaine 4% cream applied to wound bed prior to debridement Skin Barriers/Peri-Wound Care: Wound #3 Left,Posterior Amputation Site - Below Knee: Barrier cream Wound #4 Right,Medial,Plantar Foot: Spangle, Finn A. ([id number]) Barrier cream Primary Wound Dressing: Wound #3 Left,Posterior Amputation Site - Below Knee: Aquacel Ag Wound #4 Right,Medial,Plantar Foot: Aquacel Ag Secondary Dressing: Wound #3 Left,Posterior Amputation Site - Below Knee: Gauze and Kerlix/Conform Wound #4 Right,Medial,Plantar Foot: Gauze and Kerlix/Conform Dressing Change Frequency: Wound #3 Left,Posterior Amputation Site - Below Knee: Change dressing every day. Wound #4 Right,Medial,Plantar Foot: Change dressing every day. Follow-up Appointments: Wound #3 Left,Posterior Amputation Site - Below Knee: Return Appointment in 1 week. Off-Loading: Wound #4 Right,Medial,Plantar Foot: Other: - Felt Additional Orders / Instructions: Wound #3 Left,Posterior Amputation Site - Below Knee: Increase protein intake. OK to return to work OK to return to work with the following  restrictions: Activity as tolerated Other: - Vitamin A, C, [employer], MVI Wound #4 Right,Medial,Plantar Foot: Increase protein intake. OK to return to work OK to return to work with the following restrictions: Activity as tolerated Other: - Vitamin A, C, [employer], MVI For the left posterior thigh wound we will use silver alginate and an appropriate bandage over this to help him place his socket for his prostheses. On the right foot after sharp debridement we will use silver alginate in the wound using an offloading felt around the ulcer and he will offload with his diabetic shoe. Control of his diabetes mellitus and nutrition has been emphasized. [name], Cecilia A. ([id number]) Electronic Signature(s) Signed: 05/08/2016 10:24:28 AM By: Evlyn KannerBritto, Jazara Swiney MD, FACS Entered By: Evlyn KannerBritto, Alzena Gerber on 05/08/2016 10:24:28 Nyman, Xian AMarland Kitchen. ([id number]) -------------------------------------------------------------------------------- SuperBill Details Patient Name: Richard KylePHILLIPS, Edris A. Date of Service: 05/08/2016 Medical Record Number: [id number] Patient Account Number: [id number] Date of Birth/Sex: 1966-08-05 (50 yNormano. 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 [name] Other soft tissue disorders related to use, overuse and pressure, left thigh E66[name]01 Morbid (severe) obesity due to excess calories L97[name]412 Non-pressure chronic ulcer of right heel and midfoot with fat layer exposed M14[name]671 Charcot's joint, right ankle and foot Facility Procedures CPT4 Code Description: [id number] 11042 - DEB SUBQ TISSUE 20 SQ CM/< ICD-10 Description Diagnosis [name] Type 2 diabetes mellitus with other skin  ulcer [name] Non-pressure chronic ulcer of left thigh with fat la L97[name]412 Non-pressure chronic ulcer of  right heel and midfoot Modifier: yer exposed with fat la Quantity: 1 yer exposed Physician Procedures CPT4 Code Description: [id number] 99213 - WC PHYS LEVEL 3 - EST PT ICD-10 Description Diagnosis [name] Type 2 diabetes mellitus with other skin ulcer [name] Non-pressure chronic ulcer of left thigh with fat la L97[name]412 Non-pressure chronic ulcer of right  heel and midfoot M14[name]671 Charcot's joint, right ankle and foot Modifier: 25 yer exposed with fat lay Quantity: 1 er exposed CPT4 Code Description: [id number] 11042 - WC PHYS SUBQ TISS 20 SQ CM ICD-10 Description Diagnosis [name] Type 2 diabetes mellitus with other skin ulcer [name] Non-pressure chronic ulcer of left thigh with fat la [name] Non-pressure chronic ulcer of right  heel and midfoot Steptoe, Edy A. ([id number]) Modifier: yer exposed with fat lay Quantity: 1 er exposed Electronic Signature(s) Signed: 05/08/2016 10:24:57 AM By: Evlyn KannerBritto, Washington Whedbee MD, FACS Entered By: Evlyn KannerBritto, Bobby Barton on 05/08/2016 10:24:57

## 2016-05-09 NOTE — Progress Notes (Signed)
Idamae SchullerHILLIPS, [name] ([id number]) Visit Report for 05/08/2016 Arrival Information Details Patient Name: [name], [name] Date of Service: 05/08/2016 8:45 AM Medical Record Number: [id number] Patient Account Number: [id number] Date of Birth/Sex: [birth year] (50 y.o. Male) Treating RN: Bing MatterSlack, Angela Primary Care Physician: Angus PalmsGEORGE, SIONNE Other Clinician: Referring Physician: Angus PalmsGEORGE, SIONNE Treating Physician/Extender: Rudene ReBritto, Errol Weeks in Treatment: 9 Visit Information History Since Last Visit Added or deleted any medications: No Patient Arrived: Ambulatory Any new allergies or adverse reactions: No Arrival Time: 09:37 Had a fall or experienced change in No Accompanied By: self activities of daily living that may affect Transfer Assistance: None risk of falls: Patient Identification Verified: Yes Signs or symptoms of abuse/neglect since last No Secondary Verification Process Yes visito Completed: Hospitalized since last visit: No Patient Requires Transmission-Based No Has Dressing in Place as Prescribed: Yes Precautions: Pain Present Now: No Patient Has Alerts: No Electronic Signature(s) Signed: 05/08/2016 5:30:07 PM By: Bing MatterSlack, Angela Entered By: Bing MatterSlack, Angela on 05/08/2016 09:39:06 [name], [name] ([id number]) -------------------------------------------------------------------------------- Encounter Discharge Information Details Patient Name: [name], [name] Date of Service: 05/08/2016 8:45 AM Medical Record Number: [id number] Patient Account Number: [id number] Date of Birth/Sex: [birth year] (50 y.o. Male) Treating RN: Bing MatterSlack, Angela Primary Care Physician: Angus PalmsGEORGE, SIONNE Other Clinician: Referring Physician: Angus PalmsGEORGE, SIONNE Treating Physician/Extender: Rudene ReBritto, Errol Weeks in Treatment: 9 Encounter Discharge Information Items Discharge Pain Level: 0 Discharge Condition: Stable Ambulatory Status: Ambulatory Discharge Destination: Home Transportation: Private  Auto Accompanied By: self Schedule Follow-up Appointment: No Medication Reconciliation completed and provided to Patient/Care No [name]: Provided on Clinical Summary of Care: 05/08/2016 Form Type Recipient Paper Patient DP Electronic Signature(s) Signed: 05/08/2016 10:34:53 AM By: Gwenlyn PerkingMoore, Shelia Entered By: Gwenlyn PerkingMoore, Shelia on 05/08/2016 10:34:52 [name], [name] ([id number]) -------------------------------------------------------------------------------- Multi-Disciplinary Care Plan Details Patient Name: [name], [name] Date of Service: 05/08/2016 8:45 AM Medical Record Number: [id number] Patient Account Number: [id number] Date of Birth/Sex: [birth year] (50 y.o. Male) Treating RN: Bing MatterSlack, Angela Primary Care Physician: Angus PalmsGEORGE, SIONNE Other Clinician: Referring Physician: Angus PalmsGEORGE, SIONNE Treating Physician/Extender: Rudene ReBritto, Errol Weeks in Treatment: 9 Active Inactive Abuse / Safety / Falls / Self Care Management Nursing Diagnoses: Knowledge deficit related to: safety; personal, health (wound), emergency Potential for falls Self care deficit: actual or potential Goals: Patient will remain injury free Date Initiated: 03/06/2016 Goal Status: Active Patient/caregiver will verbalize understanding of skin care regimen Date Initiated: 03/06/2016 Goal Status: Active Patient/caregiver will verbalize/demonstrate measure taken to improve self care Date Initiated: 03/06/2016 Goal Status: Active Patient/caregiver will verbalize/demonstrate measures taken to improve the patient's personal safety Date Initiated: 03/06/2016 Goal Status: Active Patient/caregiver will verbalize/demonstrate measures taken to prevent injury and/or falls Date Initiated: 03/06/2016 Goal Status: Active Patient/caregiver will verbalize/demonstrate understanding of what to do in case of emergency Date Initiated: 03/06/2016 Goal Status: Active Interventions: Assess fall risk on admission and as needed Assess:  immobility, friction, shearing, incontinence upon admission and as needed Assess impairment of mobility on admission and as needed per policy Assess self care needs on admission and as needed Patient referred to community resources (specify in notes) Provide education on basic hygiene Provide education on fall prevention Segar, Cederic A. ([id number]) Provide education on personal and home safety Provide education on safe transfers Treatment Activities: Education provided on Basic Hygiene : 05/01/2016 Notes: Orientation to the Wound Care Program Nursing Diagnoses: Knowledge deficit related to the wound healing center program Goals: Patient/caregiver will verbalize understanding of the Wound Healing Center Program Date Initiated: 03/06/2016 Goal Status: Active Interventions: Provide education on orientation to the wound center  Notes: Pressure Nursing Diagnoses: Knowledge deficit related to causes and risk factors for pressure ulcer development Knowledge deficit related to management of pressures ulcers Potential for impaired tissue integrity related to pressure, friction, moisture, and shear Goals: Patient will remain free from development of additional pressure ulcers Date Initiated: 03/06/2016 Goal Status: Active Patient will remain free of pressure ulcers Date Initiated: 03/06/2016 Goal Status: Active Patient/caregiver will verbalize risk factors for pressure ulcer development Date Initiated: 03/06/2016 Goal Status: Active Patient/caregiver will verbalize understanding of pressure ulcer management Date Initiated: 03/06/2016 Goal Status: Active Interventions: Assess: immobility, friction, shearing, incontinence upon admission and as needed Assess offloading mechanisms upon admission and as needed [name], [name] ([id number]) Assess potential for pressure ulcer upon admission and as needed Provide education on pressure ulcers Notes: Wound/Skin Impairment Nursing  Diagnoses: Impaired tissue integrity Knowledge deficit related to smoking impact on wound healing Knowledge deficit related to ulceration/compromised skin integrity Goals: Patient/caregiver will verbalize understanding of skin care regimen Date Initiated: 03/06/2016 Goal Status: Active Ulcer/skin breakdown will have a volume reduction of 30% by week 4 Date Initiated: 03/06/2016 Goal Status: Active Ulcer/skin breakdown will have a volume reduction of 50% by week 8 Date Initiated: 03/06/2016 Goal Status: Active Ulcer/skin breakdown will have a volume reduction of 80% by week 12 Date Initiated: 03/06/2016 Goal Status: Active Ulcer/skin breakdown will heal within 14 weeks Date Initiated: 03/06/2016 Goal Status: Active Interventions: Assess patient/caregiver ability to obtain necessary supplies Assess patient/caregiver ability to perform ulcer/skin care regimen upon admission and as needed Assess ulceration(s) every visit Provide education on ulcer and skin care Treatment Activities: Referred to DME Vernal Rutan for dressing supplies : 03/06/2016 Skin care regimen initiated : 03/06/2016 Topical wound management initiated : 03/06/2016 Notes: Electronic Signature(s) Signed: 05/08/2016 5:30:07 PM By: Bing Matter Entered By: Bing Matter on 05/08/2016 10:04:08 [name], [name] ([id number]) Brickle, Brayton A. ([id number]) -------------------------------------------------------------------------------- Pain Assessment Details Patient Name: Richard Kyle A. Date of Service: 05/08/2016 8:45 AM Medical Record Number: [id number] Patient Account Number: [id number] Date of Birth/Sex: 1965/12/19 (50 y.o. Male) Treating RN: Bing Matter Primary Care Physician: Angus Palms Other Clinician: Referring Physician: Angus Palms Treating Physician/Extender: Rudene Re in Treatment: 9 Active Problems Location of Pain Severity and Description of Pain Patient Has Paino No Site  Locations Pain Management and Medication Current Pain Management: Electronic Signature(s) Signed: 05/08/2016 5:30:07 PM By: Bing Matter Entered By: Bing Matter on 05/08/2016 09:39:14 Dreisbach, Rhyker A. ([id number]) -------------------------------------------------------------------------------- Patient/Caregiver Education Details Patient Name: Richard Kyle A. Date of Service: 05/08/2016 8:45 AM Medical Record Number: [id number] Patient Account Number: [id number] Date of Birth/Gender: [birth year] (50 y.o. Male) Treating RN: Bing Matter Primary Care Physician: Angus Palms Other Clinician: Referring Physician: Angus Palms Treating Physician/Extender: Rudene Re in Treatment: 9 Education Assessment Education Provided To: Patient Education Topics Provided Wound/Skin Impairment: Methods: Explain/Verbal Responses: State content correctly Electronic Signature(s) Signed: 05/08/2016 5:30:07 PM By: Bing Matter Entered By: Bing Matter on 05/08/2016 10:31:29 Carfagno, Yadier A. ([id number]) -------------------------------------------------------------------------------- Wound Assessment Details Patient Name: Richard Kyle A. Date of Service: 05/08/2016 8:45 AM Medical Record Number: [id number] Patient Account Number: [id number] Date of Birth/Sex: 1966-03-14 (50 y.o. Male) Treating RN: Bing Matter Primary Care Physician: Angus Palms Other Clinician: Referring Physician: Angus Palms Treating Physician/Extender: Rudene Re in Treatment: 9 Wound Status Wound Number: 3 Primary Diabetic Wound/Ulcer of the Lower Etiology: Extremity Wound Location: Left Amputation Site - Below Knee - Posterior Wound Open Status: Wounding Event: Pressure Injury Comorbid Chronic sinus problems/congestion, Date Acquired: 06/26/2015 History: Anemia, Asthma,  Sleep Apnea, Weeks Of Treatment: 9 Coronary Artery Disease, Hypertension, Clustered Wound: No Myocardial  Infarction, Type II Diabetes, History of pressure wounds, Gout, Neuropathy Wound Measurements Length: (cm) 4.3 Width: (cm) 7.4 Depth: (cm) 0.2 Area: (cm) 24.991 Volume: (cm) 4.998 % Reduction in Area: 11.6% % Reduction in Volume: 11.6% Epithelialization: None Tunneling: No Undermining: No Wound Description Classification: Grade 2 Foul Odor Afte Wound Margin: Thickened Due to Product Exudate Amount: Large Exudate Type: Purulent Exudate Color: yellow, brown, green r Cleansing: Yes Use: No Wound Bed Granulation Amount: Large (67-100%) Exposed Structure Granulation Quality: Pink Fascia Exposed: No Necrotic Amount: Small (1-33%) Fat Layer Exposed: No Necrotic Quality: Adherent Slough Tendon Exposed: No Muscle Exposed: No Joint Exposed: No Bone Exposed: No Limited to Skin Breakdown Periwound Skin Texture Texture Color No Abnormalities Noted: No No Abnormalities Noted: No [name], [name] ([id number]) Callus: No Atrophie Blanche: No Crepitus: No Cyanosis: No Excoriation: No Ecchymosis: No Fluctuance: No Erythema: No Friable: No Hemosiderin Staining: No Induration: No Mottled: No Localized Edema: No Pallor: No Rash: No Rubor: No Scarring: Yes Temperature / Pain Moisture Temperature: No Abnormality No Abnormalities Noted: No Tenderness on Palpation: Yes Dry / Scaly: No Maceration: Yes Moist: Yes Wound Preparation Ulcer Cleansing: Rinsed/Irrigated with Saline Topical Anesthetic Applied: Other: lidocaine 4%, Treatment Notes Wound #3 (Left, Posterior Amputation Site - Below Knee) 1. Cleansed with: Clean wound with Normal Saline 4. Dressing Applied: Calcium Alginate with Silver 5. Secondary Dressing Applied Dry Gauze Kerlix/Conform 6. Footwear/Offloading device applied Felt/Foam 7. Secured with Tape Notes felt callous pad to periwound of rt. plantar foot wound Electronic Signature(s) Signed: 05/08/2016 5:30:07 PM By: Bing MatterSlack, Angela Entered By:  Bing MatterSlack, Angela on 05/08/2016 09:59:33 [name], [name] ([id number]) -------------------------------------------------------------------------------- Wound Assessment Details Patient Name: [name], [name] Date of Service: 05/08/2016 8:45 AM Medical Record Number: [id number] Patient Account Number: [id number] Date of Birth/Sex: 1966-08-08 (50 y.o. Male) Treating RN: Bing MatterSlack, Angela Primary Care Physician: Angus PalmsGEORGE, SIONNE Other Clinician: Referring Physician: Angus PalmsGEORGE, SIONNE Treating Physician/Extender: Rudene ReBritto, Errol Weeks in Treatment: 9 Wound Status Wound Number: 4 Primary Diabetic Wound/Ulcer of the Lower Etiology: Extremity Wound Location: Right Foot - Plantar, Medial Wound Open Wounding Event: Gradually Appeared Status: Date Acquired: 03/28/2016 Comorbid Chronic sinus problems/congestion, Weeks Of Treatment: 1 History: Anemia, Asthma, Sleep Apnea, Clustered Wound: No Coronary Artery Disease, Hypertension, Myocardial Infarction, Type II Diabetes, History of pressure wounds, Gout, Neuropathy Wound Measurements Length: (cm) 1.5 Width: (cm) 0.5 Depth: (cm) 0.5 Area: (cm) 0.589 Volume: (cm) 0.295 % Reduction in Area: 50% % Reduction in Volume: -150% Epithelialization: None Tunneling: No Undermining: No Wound Description Classification: Grade 1 Wound Margin: Thickened Exudate Amount: Medium Exudate Type: Serosanguineous Exudate Color: red, brown Foul Odor After Cleansing: No Wound Bed Granulation Amount: Large (67-100%) Exposed Structure Granulation Quality: Red Fascia Exposed: No Necrotic Amount: None Present (0%) Fat Layer Exposed: No Tendon Exposed: No Muscle Exposed: No Joint Exposed: No Bone Exposed: No Limited to Skin Breakdown Periwound Skin Texture Texture Color No Abnormalities Noted: No No Abnormalities Noted: No Mcclanahan, Vrishank A. ([id number]) Callus: Yes Atrophie Blanche: No Crepitus: No Cyanosis: No Excoriation: No Ecchymosis:  No Fluctuance: No Erythema: No Friable: No Hemosiderin Staining: No Induration: No Mottled: No Localized Edema: No Pallor: No Rash: No Rubor: No Scarring: No Temperature / Pain Moisture Temperature: No Abnormality No Abnormalities Noted: No Dry / Scaly: Yes Maceration: No Moist: No Wound Preparation Ulcer Cleansing: Rinsed/Irrigated with Saline Topical Anesthetic Applied: Other: Lidocaine 4%, Treatment Notes Wound #4 (Right, Medial, Plantar Foot) 1. Cleansed  with: Clean wound with Normal Saline 4. Dressing Applied: Calcium Alginate with Silver 5. Secondary Dressing Applied Dry Gauze Kerlix/Conform 6. Footwear/Offloading device applied Felt/Foam 7. Secured with Tape Notes felt callous pad to periwound of rt. plantar foot wound Electronic Signature(s) Signed: 05/08/2016 5:30:07 PM By: Bing Matter Entered By: Bing Matter on 05/08/2016 10:00:27 Bouvier, Tarance A. ([id number]) -------------------------------------------------------------------------------- Vitals Details Patient Name: Richard Kyle A. Date of Service: 05/08/2016 8:45 AM Medical Record Number: [id number] Patient Account Number: [id number] Date of Birth/Sex: Jun 27, 1966 (50 y.o. Male) Treating RN: Bing Matter Primary Care Physician: Angus Palms Other Clinician: Referring Physician: Angus Palms Treating Physician/Extender: Rudene Re in Treatment: 9 Vital Signs Time Taken: 09:38 Temperature (F): 98.3 Height (in): 66 Pulse (bpm): 69 Source: Stated Respiratory Rate (breaths/min): 16 Weight (lbs): 280 Blood Pressure (mmHg): 127/81 Source: Stated Reference Range: 80 - 120 mg / dl Body Mass Index (BMI): 45.2 Electronic Signature(s) Signed: 05/08/2016 5:30:07 PM By: Bing Matter Entered By: Bing Matter on 05/08/2016 09:39:57

## 2016-05-12 ENCOUNTER — Ambulatory Visit: Payer: PPO | Admitting: Surgery

## 2016-05-15 ENCOUNTER — Ambulatory Visit: Payer: PPO | Admitting: Surgery

## 2016-05-16 ENCOUNTER — Telehealth: Payer: Self-pay | Admitting: Podiatry

## 2016-05-16 NOTE — Telephone Encounter (Signed)
Patient only has 1 foot and we ordered diabetic inserts and shoes for him. The inserts he is wearing are wornout with a hole in them and he said that we put a rush on his inserts because he has a diabetic wound that we are treating. Is it okay to dispense only the inserts at this time ? They just came in today.)  But the shoes have not arrived yet. He is requesting to pick up the inserts now. IS that okay?

## 2016-05-16 NOTE — Telephone Encounter (Signed)
I think per guidelines they have to be dispensed together.

## 2016-05-22 ENCOUNTER — Ambulatory Visit: Payer: PPO | Admitting: Surgery

## 2016-05-28 DIAGNOSIS — Z4781 Encounter for orthopedic aftercare following surgical amputation: Secondary | ICD-10-CM | POA: Diagnosis not present

## 2016-05-28 DIAGNOSIS — Z89512 Acquired absence of left leg below knee: Secondary | ICD-10-CM | POA: Diagnosis not present

## 2016-05-29 ENCOUNTER — Encounter: Payer: Self-pay | Admitting: General Surgery

## 2016-05-29 ENCOUNTER — Encounter: Payer: PPO | Attending: General Surgery | Admitting: General Surgery

## 2016-05-29 DIAGNOSIS — M70852 Other soft tissue disorders related to use, overuse and pressure, left thigh: Secondary | ICD-10-CM | POA: Insufficient documentation

## 2016-05-29 DIAGNOSIS — M14671 Charcot's joint, right ankle and foot: Secondary | ICD-10-CM | POA: Insufficient documentation

## 2016-05-29 DIAGNOSIS — L97919 Non-pressure chronic ulcer of unspecified part of right lower leg with unspecified severity: Secondary | ICD-10-CM | POA: Diagnosis not present

## 2016-05-29 DIAGNOSIS — G4730 Sleep apnea, unspecified: Secondary | ICD-10-CM | POA: Diagnosis not present

## 2016-05-29 DIAGNOSIS — L97412 Non-pressure chronic ulcer of right heel and midfoot with fat layer exposed: Secondary | ICD-10-CM | POA: Insufficient documentation

## 2016-05-29 DIAGNOSIS — L97122 Non-pressure chronic ulcer of left thigh with fat layer exposed: Secondary | ICD-10-CM | POA: Diagnosis not present

## 2016-05-29 DIAGNOSIS — E11622 Type 2 diabetes mellitus with other skin ulcer: Secondary | ICD-10-CM

## 2016-05-29 DIAGNOSIS — I10 Essential (primary) hypertension: Secondary | ICD-10-CM | POA: Diagnosis not present

## 2016-05-29 DIAGNOSIS — IMO0002 Reserved for concepts with insufficient information to code with codable children: Secondary | ICD-10-CM

## 2016-05-29 DIAGNOSIS — E1140 Type 2 diabetes mellitus with diabetic neuropathy, unspecified: Secondary | ICD-10-CM | POA: Diagnosis not present

## 2016-05-29 DIAGNOSIS — I2510 Atherosclerotic heart disease of native coronary artery without angina pectoris: Secondary | ICD-10-CM | POA: Insufficient documentation

## 2016-05-29 DIAGNOSIS — E039 Hypothyroidism, unspecified: Secondary | ICD-10-CM | POA: Diagnosis not present

## 2016-05-29 NOTE — Progress Notes (Addendum)
SABIN, GIBEAULT ([id number]) Visit Report for 05/29/2016 Chief Complaint Document Details Patient Name: [name], [name]. Date of Service: 05/29/2016 8:00 AM Medical Record Number: [id number] Patient Account Number: [id number] Date of Birth/Sex: October 08, 1966 (50 y.o. Male) Treating RN: Afful, RN, BSN, [place] Sink Primary Care Physician: Angus Palms Other Clinician: Referring Physician: Angus Palms Treating Physician/Extender: Elayne Snare in Treatment: 12 Information Obtained from: Patient Chief Complaint Patients presents for treatment of an open diabetic ulcer and pressure injury to his left lower thigh posteriorly for about [name] year. he is back after her recent consultation at [facility] outpatient prosthetic center Electronic Signature(s) Signed: 05/29/2016 8:12:04 AM By: Ardath Sax MD Entered By: Ardath Sax on 05/29/2016 08:12:04 Marsteller, Hamza AMarland Kitchen ([id number]) -------------------------------------------------------------------------------- Debridement Details Patient Name: [name]. Date of Service: 05/29/2016 8:00 AM Medical Record Number: [id number] Patient Account Number: [id number] Date of Birth/Sex: 07-07-1966 (50 y.o. Male) Treating RN: Afful, RN, BSN, [place] Sink Primary Care Physician: Angus Palms Other Clinician: Referring Physician: Angus Palms Treating Physician/Extender: Elayne Snare in Treatment: 12 Debridement Performed for Wound #4 Right,Medial,Plantar Foot Assessment: Performed By: Physician Ardath Sax, MD Debridement: Debridement Pre-procedure Yes Verification/Time Out Taken: Start Time: 08:17 Pain Control: Lidocaine 4% Topical Solution Level: Skin/Subcutaneous Tissue Total Area Debrided (L x 1 (cm) x 0.4 (cm) = 0.4 (cm) W): Tissue and other Callus material debrided: Instrument: Blade Bleeding: None End Time: 08:20 Procedural Pain: 0 Post Procedural Pain: 0 Response to Treatment: Procedure was tolerated  well Post Debridement Measurements of Total Wound Length: (cm) 1 Width: (cm) 0.4 Depth: (cm) 0.2 Volume: (cm) 0.063 Post Procedure Diagnosis Same as Pre-procedure Electronic Signature(s) Signed: 05/29/2016 4:12:24 PM By: Elpidio Eric BSN, RN Signed: 05/29/2016 4:50:06 PM By: Ardath Sax MD Entered By: Elpidio Eric on 05/29/2016 08:20:20 Boerema, Chrishaun [name]. ([id number]) -------------------------------------------------------------------------------- HPI Details Patient Name: [name]. Date of Service: 05/29/2016 8:00 AM Medical Record Number: [id number] Patient Account Number: [id number] Date of Birth/Sex: 1966-09-02 (50 y.o. Male) Treating RN: Afful, RN, BSN, [place] Sink Primary Care Physician: Angus Palms Other Clinician: Referring Physician: Angus Palms Treating Physician/Extender: Elayne Snare in Treatment: 12 History of Present Illness Location: left lower thigh posteriorly Quality: Patient reports experiencing [name] dull pain to affected area(s). Severity: Patient states wound are getting worse. 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 HPI Description: 50 year old gentleman with past medical history of diabetes mellitus with last hemoglobin A1c done in February 2016 was 8.7, hypertension, hypothyroidism, coronary artery disease, ischemic cardiomyopathy , nephropathy, obesity, peripheral neuropathy, peripheral vascular disease and sleep apnea. 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 He has had problems with his left BKA prosthesis for [name] long while and had [name] vacuum system and then had to be changed over to [name] sleeve system and has been working with the orthopedic and prosthetic department at [facility]. He has also had [name] plastic surgery opinion in the past. The ulceration on the posterior part of his lower thigh continued to be aggravating an enlarging  and have [name] foul order and drainage. 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 Lubertha BasqueJennifer Dye the PA whonoted that he continue to wear his prosthesis against medical advice. details from his past surgical history notes that he's had [name] cardiac catheterization, coronary angioplasty, amputation of the left leg in 2012, debridement of skin and subcutis tissue in June 2013, application of wound VAC at that time, below knee amputation revision in April 2014. Recommendation was for [name] new socket to be made which should be above the new wound. He also recommended surgical revision of the redundant skin and to stay off the prosthesis to the wound heals but the patient would not be able to do this because of work. Hence he was advised to establish with [name] local wound clinic for management of his current wound and [facility] with Dr. Mindi Curlingawney. If he decided to go back to them for surgery he should see Dr. Lanier Ensigneilley. 03/27/2016 -- he was unable to get his prosthesis made at the place where they had begun to mold and hence he has to find himself [name] mother prosthetic department and is working on this. 04/10/2016 -- on his left popliteal fossa area he has got [name] small satellite abrasion with an ulceration which was caused by pulling off the socket on his skin. He  still has to see his prosthetic department. Idamae SchullerHILLIPS, Merdith [name]. ([id number]) 05/01/2016 -- today he comes with [name] new problem on his right foot which he says he's had for about 2 months and has been treated by his podiatrist Dr. Ovid CurdMatthew Wagoner. Says due to the fact that his footwear and insoles were worn out, he has developed an ulcerated area which continues to drain sero- sanguinous fluid and has been on doxycycline for about 2 months. He saw Dr. Ardelle AntonWagoner who did an x-ray of the foot and has been following up with him for [name] long while regarding this and managing his Charcot's arthropathy right foot. Today the patient wanted me to take [name] look at it and agrees to undergo treatment for this. 05/08/2016 -- I have reviewed notes from Dr. Vaughan SineMatthew Wagner who saw him on 05/06/2016. after review he asked him to continue with local wound care and started him on ciprofloxacin as well as his doxycycline. note is made of the fact that on his previous visit on 02/01/2016 x-rays were done which showed chronic arthritic changes present in the rear foot due to Charcot deformity. Vessel occification is present. Electronic Signature(s) Signed: 05/29/2016 8:14:02 AM By: Ardath SaxParker, Prisilla Kocsis MD Entered By: Ardath SaxParker, Evelynne Spiers on 05/29/2016 08:14:02 Matsushima, Jens SomARREN [name]. ([id number]) -------------------------------------------------------------------------------- Physical Exam Details Patient Name: Richard KylePHILLIPS, Ab [name]. Date of Service: 05/29/2016 8:00 AM Medical Record Number: [id number] Patient Account Number: [id number] Date of Birth/Sex: 14-Mar-1966 (50 y.o. Male) Treating RN: Afful, RN, BSN, [place] Sinkita Primary Care Physician: Angus PalmsGEORGE, SIONNE Other Clinician: Referring Physician: Angus PalmsGEORGE, SIONNE Treating Physician/Extender: Elayne SnarePARKER, Traeh Milroy Weeks in Treatment: 12 Electronic Signature(s) Signed: 05/29/2016 8:14:10 AM By: Ardath SaxParker, Niesha Bame MD Entered By: Ardath SaxParker, Lyon Dumont on 05/29/2016 08:14:10 Whittley, Jens SomARREN [name].  ([id number]) -------------------------------------------------------------------------------- Physician Orders Details Patient Name: Richard KylePHILLIPS, Joeangel [name]. Date of Service: 05/29/2016 8:00 AM Medical Record Number: [id number] Patient Account Number: [id number] Date of Birth/Sex: 14-Mar-1966 (50 y.o. 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 o May shower with protection. Wound #4 Right,Medial,Plantar Foot o Cleanse wound with mild soap and water o May Shower, gently pat wound dry prior to applying new dressing. o May shower with protection. Anesthetic Wound #3 Left,Posterior Amputation Site - Below Knee o Topical Lidocaine 4% cream applied to wound bed prior to debridement Wound #4 Right,Medial,Plantar Foot o Topical Lidocaine 4% cream applied to wound bed prior to debridement Skin Barriers/Peri-Wound Care Wound #3 Left,Posterior Amputation Site - Below Knee o Barrier cream Wound #4 Right,Medial,Plantar Foot o Barrier cream Primary Wound Dressing Hernon, Tamir [name]. ([id number]) Wound #3 Left,Posterior Amputation  Site - Below Knee o Aquacel Ag Wound #4 Right,Medial,Plantar Foot o Aquacel Ag Secondary Dressing Wound #3 Left,Posterior Amputation Site - Below Knee o Gauze and Kerlix/Conform Wound #4 Right,Medial,Plantar Foot o Gauze and Kerlix/Conform Dressing Change Frequency Wound #3 Left,Posterior Amputation Site - Below Knee o Change dressing every day. Wound #4 Right,Medial,Plantar Foot o Change dressing every day. Follow-up Appointments Wound #3 Left,Posterior Amputation Site - Below Knee o Return Appointment in 1 week. Off-Loading Wound #4 Right,Medial,Plantar Foot o Other: - Felt Additional Orders / Instructions Wound #3 Left,Posterior Amputation Site - Below Knee o Increase protein intake. o OK to return to work o OK to return to work with the following restrictions: o Activity as tolerated o Other: - Vitamin [name], C, [employer], MVI Wound #4 Right,Medial,Plantar Foot o Increase protein intake. o OK to return to work o OK to return to work with the following restrictions: o Activity as tolerated o Other: - Vitamin [name], C, [employer], MVI Electronic Signature(s) Signed: 05/29/2016 4:12:24 PM By: Elpidio Eric BSN, RN Pring, Jens Som ([id number]) Signed: 05/29/2016 4:50:06 PM By: Ardath Sax MD Entered By: Elpidio Eric on 05/29/2016 08:20:44 Hor, Nollan AMarland Kitchen ([id number]) -------------------------------------------------------------------------------- Problem List Details Patient Name: [name]. Date of Service: 05/29/2016 8:00 AM Medical Record Number: [id number] Patient Account Number: [id number] Date of Birth/Sex: 06-08-1966 (50 y.o. Male) Treating RN: Afful, RN, BSN, Rita Primary Care Physician: Angus Palms Other Clinician: Referring Physician: Angus Palms Treating Physician/Extender: Elayne Snare in Treatment: 12 Active Problems ICD-10 Encounter Code Description Active Date Diagnosis E11.622 Type 2 diabetes mellitus with  other skin ulcer 03/06/2016 Yes L97.122 Non-pressure chronic ulcer of left thigh with fat layer 03/06/2016 Yes exposed M70.852 Other soft tissue disorders related to use, overuse and 03/06/2016 Yes pressure, left thigh E66.01 Morbid (severe) obesity due to excess calories 03/06/2016 Yes L97.412 Non-pressure chronic ulcer of right heel and midfoot with 05/01/2016 Yes fat layer exposed M14.671 Charcot's joint, right ankle and foot 05/01/2016 Yes Inactive Problems Resolved Problems Electronic Signature(s) Signed: 05/29/2016 8:11:48 AM By: Ardath Sax MD Entered By: Ardath Sax on 05/29/2016 08:11:48 Roussin, Deiontae [name]. ([id number]) -------------------------------------------------------------------------------- Progress  Note Details Patient Name: JATORIAN, RENAULT [name]. Date of Service: 05/29/2016 8:00 AM Medical Record Number: [id number] Patient Account Number: [id number] Date of Birth/Sex: Sep 28, 1966 (50 y.o. Male) Treating RN: Afful, RN, BSN, [place] Sink Primary Care Physician: Angus Palms Other Clinician: Referring Physician: Angus Palms Treating Physician/Extender: Elayne Snare in Treatment: 12 Subjective Chief Complaint Information obtained from Patient Patients presents for treatment of an open diabetic ulcer and pressure injury to his left lower thigh posteriorly for about [name] year. he is back after her recent consultation at [facility] outpatient prosthetic center History of Present Illness (HPI) The following HPI elements were documented for the patient's wound: Location: left lower thigh posteriorly Quality: Patient reports experiencing [name] dull pain to affected area(s). Severity: Patient states wound are getting worse. Duration: Patient has had the wound for > 11 months prior to seeking treatment at the wound center Timing: Pain in wound is Intermittent (comes and goes Context: The wound occurred when the patient has been wearing [name] prosthesis on his left lower extremity  for [name] left BKA Modifying Factors: Other treatment(s) tried include:working with the prosthetic and orthopedic department at [facility] Associated Signs and Symptoms: Patient reports having foul odor. 50 year old gentleman with past medical history of diabetes mellitus with last hemoglobin A1c done in February 2016 was 8.7, hypertension, hypothyroidism, coronary artery disease, ischemic cardiomyopathy , nephropathy, obesity, peripheral neuropathy, peripheral vascular disease and sleep apnea. 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 He has had problems with his left BKA prosthesis for [name] long while and had [name] vacuum system and then had to be changed over to [name] sleeve system and has been working with the orthopedic and prosthetic department at [facility]. He has also had [name] plastic surgery opinion in the past. The ulceration on the posterior part of his lower thigh continued to be aggravating an enlarging and have [name] foul order and drainage. 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 and was seen by Ms. Lubertha Basque the PA whonoted that he continue to wear his prosthesis against medical advice. details from his past surgical history notes that he's had [name] cardiac catheterization, coronary angioplasty, amputation of the left leg in 2012, debridement of skin and subcutis tissue in June 2013, application of wound VAC at that time, below knee amputation revision in April 2014. Lisby, Pantelis [name]. ([id number]) Recommendation was for [name] new socket to be made which should be above the new wound. He also recommended surgical revision of the redundant skin and to stay off the prosthesis to the wound heals  but the patient would not be able to do this because of work. Hence he was advised to establish with [name] local wound clinic for management of his current wound and follow-up with the amputee clinic at [facility] or with Dr. Mindi Curling. If he decided to go back to them for surgery he should see Dr. Lanier Ensign. 03/27/2016 -- he was unable to get his prosthesis made at the place where they had begun to mold and hence he has to find himself [name] mother prosthetic department and is working on this. 04/10/2016 -- on his left popliteal fossa area he has got [name] small satellite abrasion  with an ulceration which was caused by pulling off the socket on his skin. He still has to see his prosthetic department. 05/01/2016 -- today he comes with [name] new problem on his right foot which he says he's had for about 2 months and has been treated by his podiatrist Dr. Ovid Curd. Says due to the fact that his footwear and insoles were worn out, he has developed an ulcerated area which continues to drain sero- sanguinous fluid and has been on doxycycline for about 2 months. He saw Dr. Ardelle Anton who did an x-ray of the foot and has been following up with him for [name] long while regarding this and managing his Charcot's arthropathy right foot. Today the patient wanted me to take [name] look at it and agrees to undergo treatment for this. 05/08/2016 -- I have reviewed notes from Dr. Vaughan Sine who saw him on 05/06/2016. after review he asked him to continue with local wound care and started him on ciprofloxacin as well as his doxycycline. note is made of the fact that on his previous visit on 02/01/2016 x-rays were done which showed chronic arthritic changes present in the rear foot due to Charcot deformity. Vessel occification is present. Objective Constitutional Vitals Time Taken: 8:07 AM, Height: 66 in, Weight: 280 lbs, BMI: 45.2, Temperature: 97.9 F, Pulse: 72 bpm, Respiratory Rate: 16 breaths/min, Blood Pressure: 129/77  mmHg. Integumentary (Hair, Skin) Wound #3 status is Open. Original cause of wound was Pressure Injury. The wound is located on the Left,Posterior Amputation Site - Below Knee. The wound measures 4cm length x 8cm width x 0.2cm depth; 25.133cm^2 area and 5.027cm^3 volume. Wound #4 status is Open. Original cause of wound was Gradually Appeared. The wound is located on the Right,Medial,Plantar Foot. The wound measures 1cm length x 0.4cm width x 0.2cm depth; 0.314cm^2 area and 0.063cm^3 volume. Assessment Active Problems ICD-10 DODGE, ATOR ([id number]) E11.622 - Type 2 diabetes mellitus with other skin ulcer L97.122 - Non-pressure chronic ulcer of left thigh with fat layer exposed M70.852 - Other soft tissue disorders related to use, overuse and pressure, left thigh E66.01 - Morbid (severe) obesity due to excess calories L97.412 - Non-pressure chronic ulcer of right heel and midfoot with fat layer exposed M14.671 - Charcot's joint, right ankle and foot Debrided thick callous plantar right foot. Ulcer Rx'ed with silver alginate daily. Pressure ulcer posterior left thigh also Rx'ed withalginate. He is working and walking [name] lot on his prosthesis. Post left BKA. Plan Wound Cleansing: Wound #3 Left,Posterior Amputation Site - Below Knee: Cleanse wound with mild soap and water May Shower, gently pat wound dry prior to applying new dressing. May shower with protection. Wound #4 Right,Medial,Plantar Foot: Cleanse wound with mild soap and water May Shower, gently pat wound dry prior to applying new dressing. May shower with protection. Anesthetic: Wound #3 Left,Posterior Amputation Site - Below Knee: Topical Lidocaine 4% cream applied to wound bed prior to debridement Wound #4 Right,Medial,Plantar Foot: Topical Lidocaine 4% cream applied to wound bed prior to debridement Skin Barriers/Peri-Wound Care: Wound #3 Left,Posterior Amputation Site - Below Knee: Barrier cream Wound #4  Right,Medial,Plantar Foot: Barrier cream Primary Wound Dressing: Wound #3 Left,Posterior Amputation Site - Below Knee: Aquacel Ag Wound #4 Right,Medial,Plantar Foot: Aquacel Ag Secondary Dressing: Wound #3 Left,Posterior Amputation Site - Below Knee: Gauze and Kerlix/Conform Wound #4 Right,Medial,Plantar Foot: Gauze and Kerlix/Conform Dressing Change Frequency: Wound #3 Left,Posterior Amputation Site - Below Knee: Change dressing every day. Brockel, Josiyah [name]. ([id number]) Wound #4 Right,Medial,Plantar  Foot: Change dressing every day. Follow-up Appointments: Wound #3 Left,Posterior Amputation Site - Below Knee: Return Appointment in 1 week. Off-Loading: Wound #4 Right,Medial,Plantar Foot: Other: - Felt Additional Orders / Instructions: Wound #3 Left,Posterior Amputation Site - Below Knee: Increase protein intake. OK to return to work OK to return to work with the following restrictions: Activity as tolerated Other: - Vitamin [name], C, [employer], MVI Wound #4 Right,Medial,Plantar Foot: Increase protein intake. OK to return to work OK to return to work with the following restrictions: Activity as tolerated Other: - Vitamin [name], C, [employer], MVI Follow-Up Appointments: [name] Patient Clinical Summary of Care was provided to [facility] Electronic Signature(s) Signed: 06/06/2016 11:55:20 AM By: Ardath Sax MD Previous Signature: 05/29/2016 8:49:31 AM Version By: Ardath Sax MD Entered By: Ardath Sax on 06/06/2016 11:55:20 Baehr, Ameir [name]. ([id number]) -------------------------------------------------------------------------------- SuperBill Details Patient Name: [name]. Date of Service: 05/29/2016 Medical Record Number: [id number] Patient Account Number: [id number] Date of Birth/Sex: 27-Aug-1966 (50 y.o. 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 M70.852 Other soft tissue disorders related to use, overuse and pressure, left thigh E66.01 Morbid (severe) obesity due to excess calories L97.412 Non-pressure chronic ulcer of right heel and midfoot with fat layer exposed M14.671 Charcot's joint, right ankle and foot Facility Procedures CPT4 Code Description: 56213086 11042 - DEB SUBQ TISSUE 20 SQ CM/< ICD-10 Description Diagnosis M70.852 Other soft tissue disorders related to use, overuse Modifier: and pressure, Quantity: 1 left thigh Physician Procedures CPT4 Code Description: [id number] 99213 - WC PHYS LEVEL 3 - EST PT ICD-10 Description Diagnosis E11.622 Type 2 diabetes mellitus with other skin ulcer L97.412 Non-pressure chronic ulcer of right heel and midfoot Modifier: with fat laye Quantity: 1 r exposed CPT4 Code Description: 2952841 11042 - WC PHYS SUBQ TISS 20 SQ CM ICD-10 Description Diagnosis M70.852 Other soft tissue disorders related to use, overuse Modifier: and pressure, Quantity: 1 left thigh Electronic Signature(s) Signed: 05/29/2016 8:50:55 AM By: Ardath Sax MD Entered By: Ardath Sax on 05/29/2016 08:50:55

## 2016-05-29 NOTE — Progress Notes (Signed)
Pressure ulcer after BKA left leg and DFU plantar right foot.  Rx with silver alginater and off load

## 2016-05-30 NOTE — Progress Notes (Signed)
DELANDO, SATTER ([id number]) Visit Report for 05/29/2016 Arrival Information Details Patient Name: ZELIG, GACEK A. Date of Service: 05/29/2016 8:00 AM Medical Record Number: [id number] Patient Account Number: [id number] Date of Birth/Sex: 1966-02-10 (50 y.o. Male) Treating RN: Afful, RN, BSN, [place] Sink Primary Care Physician: Angus Palms Other Clinician: Referring Physician: Angus Palms Treating Physician/Extender: Elayne Snare in Treatment: 12 Visit Information History Since Last Visit Any new allergies or adverse reactions: No Patient Arrived: Ambulatory Signs or symptoms of abuse/neglect since last No Arrival Time: 08:07 visito Accompanied By: self Hospitalized since last visit: No Transfer Assistance: None Has Dressing in Place as Prescribed: Yes Patient Identification Verified: Yes Pain Present Now: No Secondary Verification Process Yes Completed: Patient Requires Transmission-Based No Precautions: Patient Has Alerts: No Electronic Signature(s) Signed: 05/29/2016 4:12:24 PM By: Elpidio Eric BSN, RN Entered By: Elpidio Eric on 05/29/2016 08:08:18 Lauture, Jens Som ([id number]) -------------------------------------------------------------------------------- Encounter Discharge Information Details Patient Name: [name] Date of Service: 05/29/2016 8:00 AM Medical Record Number: [id number] Patient Account Number: [id number] Date of Birth/Sex: 1966-03-20 (50 y.o. Male) Treating RN: Afful, RN, BSN, [place] Sink Primary Care Physician: Angus Palms Other Clinician: Referring Physician: Angus Palms Treating Physician/Extender: Elayne Snare in Treatment: 12 Encounter Discharge Information Items Discharge Pain Level: 0 Discharge Condition: Stable Ambulatory Status: Ambulatory Discharge Destination: Home Transportation: Private Auto Accompanied By: self Schedule Follow-up Appointment: No Medication Reconciliation completed and provided to  Patient/Care No Markale Birdsell: Provided on Clinical Summary of Care: 05/29/2016 Form Type Recipient Paper Patient DP Electronic Signature(s) Signed: 05/29/2016 8:51:33 AM By: Ardath Sax MD Previous Signature: 05/29/2016 8:36:46 AM Version By: Gwenlyn Perking Entered By: Ardath Sax on 05/29/2016 08:51:33 Joung, Lot A. ([id number]) -------------------------------------------------------------------------------- Lower Extremity Assessment Details Patient Name: [name] Date of Service: 05/29/2016 8:00 AM Medical Record Number: [id number] Patient Account Number: [id number] Date of Birth/Sex: 1966/07/07 (50 y.o. Male) Treating RN: Afful, RN, BSN, [place] Sink Primary Care Physician: Angus Palms Other Clinician: Referring Physician: Angus Palms Treating Physician/Extender: Elayne Snare in Treatment: 12 Electronic Signature(s) Signed: 05/29/2016 4:12:24 PM By: Elpidio Eric BSN, RN Entered By: Elpidio Eric on 05/29/2016 08:08:29 Kreutzer, Duane AMarland Kitchen ([id number]) -------------------------------------------------------------------------------- Multi Wound Chart Details Patient Name: [name] Date of Service: 05/29/2016 8:00 AM Medical Record Number: [id number] Patient Account Number: [id number] Date of Birth/Sex: 1966-06-15 (50 y.o. 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] [4:the Lower Extremity] Comorbid History: [3:Chronic sinus problems/congestion, Anemia, Asthma, Sleep Anemia, Asthma, Sleep Apnea, Coronary Artery Apnea, Coronary Artery Disease, Hypertension, Myocardial Infarction, Type II Diabetes, History Type II Diabetes, History of  pressure wounds, Gout, of pressure wounds, Gout, Neuropathy] [4:Chronic sinus problems/congestion, Disease, Hypertension, Myocardial Infarction, Neuropathy] [N/A:N/A] Date Acquired: [3:06/26/2015] [4:03/28/2016] [N/A:N/A] Weeks of Treatment: [3:12] [4:4] [N/A:N/A] Wound Status: [3:Open] [4:Open] [N/A:N/A] Measurements L x W x D 4x8x0.2 [4:1x0.4x0.2] [N/A:N/A] (cm) Area (cm) : [3:25.133] [4:0.314] [N/A:N/A] Volume (cm) : [3:5.027] [4:0.063] [N/A:N/A] % Reduction in Area: [3:11.10%] [4:73.30%] [N/A:N/A] % Reduction in Volume: 11.10% [4:46.60%] [N/A:N/A] Classification: [3:Grade 2] [4:Grade 1] [N/A:N/A] Exudate Amount: [3:Large] [4:Medium] [N/A:N/A]  Exudate Type: [3:Purulent] [4:Serosanguineous] [N/A:N/A] Exudate Color: [3:yellow, brown, green] [4:red, brown] [N/A:N/A] Foul Odor After [3:Yes] [4:No] [N/A:N/A] Cleansing: Maggart, Nazaire A. ([id number]) Odor Anticipated Due to No N/A N/A Product Use: Wound Margin: Thickened Thickened N/A Granulation Amount: Large (67-100%) Large (67-100%) N/A Granulation Quality: Pink Red N/A Necrotic Amount: Small (1-33%) None Present (0%) N/A Exposed Structures: Fascia: No Fascia: No N/A Fat: No Fat: No Tendon: No Tendon: No Muscle: No Muscle: No Joint: No Joint: No Bone: No Bone: No Limited to Skin Limited to Skin Breakdown Breakdown Epithelialization: None None N/A Periwound Skin Texture: Scarring: Yes Callus: Yes N/A Edema: No Edema: No Excoriation: No Excoriation: No Induration: No Induration: No Callus: No Crepitus: No Crepitus: No Fluctuance: No Fluctuance: No Friable: No Friable: No Rash: No Rash:  No Scarring: No Periwound Skin Maceration: Yes Dry/Scaly: Yes N/A Moisture: Moist: Yes Maceration: No Dry/Scaly: No Moist: No Periwound Skin Color: Atrophie Blanche: No Atrophie Blanche: No N/A Cyanosis: No Cyanosis: No Ecchymosis: No Ecchymosis: No Erythema: No Erythema: No Hemosiderin Staining: No Hemosiderin Staining: No Mottled: No Mottled: No Pallor: No Pallor: No Rubor: No Rubor: No Temperature: No Abnormality No Abnormality N/A Tenderness on Yes No N/A Palpation: Wound Preparation: Ulcer Cleansing: Ulcer Cleansing: N/A Rinsed/Irrigated with Rinsed/Irrigated with Saline Saline Topical Anesthetic Topical Anesthetic Applied: Other: lidocaine Applied: Other: Lidocaine 4% 4% Treatment Notes Electronic Signature(s) LASHAWN, ORREGO ([id number]) Signed: 05/29/2016 4:12:24 PM By: Elpidio Eric BSN, RN Entered By: Elpidio Eric on 05/29/2016 08:19:25 Kohn, Jens Som ([id number]) -------------------------------------------------------------------------------- Multi-Disciplinary Care Plan Details Patient Name: [name] Date of Service: 05/29/2016 8:00 AM Medical Record Number: [id number] Patient Account Number: [id number] Date of Birth/Sex: 1966-05-29 (50 y.o. Male) Treating RN: Afful, RN, BSN, [place] Sink Primary Care Physician: Angus Palms Other Clinician: Referring Physician: Angus Palms Treating Physician/Extender: Elayne Snare in Treatment: 12 Active Inactive Abuse / Safety / Falls / Self Care Management Nursing Diagnoses: Knowledge deficit related to: safety; personal, health (wound), emergency Potential for falls Self care deficit: actual or potential Goals: Patient will remain injury free Date Initiated: 03/06/2016 Goal Status: Active Patient/caregiver will verbalize understanding of skin care regimen Date Initiated: 03/06/2016 Goal Status: Active Patient/caregiver will verbalize/demonstrate measure taken to improve self care Date  Initiated: 03/06/2016 Goal Status: Active Patient/caregiver will verbalize/demonstrate measures taken to improve the patient's personal safety Date Initiated: 03/06/2016 Goal Status: Active Patient/caregiver will verbalize/demonstrate measures taken to prevent injury and/or falls Date Initiated: 03/06/2016 Goal Status: Active Patient/caregiver will verbalize/demonstrate understanding of what to do in case of emergency Date Initiated: 03/06/2016 Goal Status: Active Interventions: Assess fall risk on admission and as needed Assess: immobility, friction, shearing, incontinence upon admission and as needed Assess impairment of mobility on admission and as needed per policy Assess self care needs on admission and as needed Patient referred to community resources (specify in notes) Provide education on basic hygiene Provide education on fall prevention [place], Jaspreet A. ([id number]) Provide education on personal and home safety Provide education on safe transfers Treatment Activities: Education provided on Basic Hygiene : 04/10/2016 Notes: Orientation to the Wound Care Program Nursing Diagnoses: Knowledge deficit related to the wound healing center program Goals: Patient/caregiver will verbalize understanding of the Wound Healing Center Program Date Initiated: 03/06/2016 Goal Status: Active Interventions: Provide education on orientation to the wound center Notes: Pressure Nursing Diagnoses: Knowledge deficit related to causes and risk factors for pressure ulcer development Knowledge deficit related to management of pressures ulcers Potential for impaired tissue integrity related to pressure, friction, moisture, and  shear Goals: Patient will remain free from development of additional pressure ulcers Date Initiated: 03/06/2016 Goal Status: Active Patient will remain free of pressure ulcers Date Initiated: 03/06/2016 Goal Status: Active Patient/caregiver will verbalize risk factors  for pressure ulcer development Date Initiated: 03/06/2016 Goal Status: Active Patient/caregiver will verbalize understanding of pressure ulcer management Date Initiated: 03/06/2016 Goal Status: Active Interventions: Assess: immobility, friction, shearing, incontinence upon admission and as needed Assess offloading mechanisms upon admission and as needed Sonn, Hilton A. ([id number]) Assess potential for pressure ulcer upon admission and as needed Provide education on pressure ulcers Notes: Wound/Skin Impairment Nursing Diagnoses: Impaired tissue integrity Knowledge deficit related to smoking impact on wound healing Knowledge deficit related to ulceration/compromised skin integrity Goals: Patient/caregiver will verbalize understanding of skin care regimen Date Initiated: 03/06/2016 Goal Status: Active Ulcer/skin breakdown will have a volume reduction of 30% by week 4 Date Initiated: 03/06/2016 Goal Status: Active Ulcer/skin breakdown will have a volume reduction of 50% by week 8 Date Initiated: 03/06/2016 Goal Status: Active Ulcer/skin breakdown will have a volume reduction of 80% by week 12 Date Initiated: 03/06/2016 Goal Status: Active Ulcer/skin breakdown will heal within 14 weeks Date Initiated: 03/06/2016 Goal Status: Active Interventions: Assess patient/caregiver ability to obtain necessary supplies Assess patient/caregiver ability to perform ulcer/skin care regimen upon admission and as needed Assess ulceration(s) every visit Provide education on ulcer and skin care Treatment Activities: Referred to DME Ta[facility] Repka for dressing supplies : 03/06/2016 Skin care regimen initiated : 03/06/2016 Topical wound management initiated : 03/06/2016 Notes: Electronic Signature(s) Signed: 05/29/2016 4:12:24 PM By: Elpidio Eric BSN, RN Entered By: Elpidio Eric on 05/29/2016 08:19:18 Coover, Salar A. ([id number]) Curran, Afton AMarland Kitchen  ([id number]) -------------------------------------------------------------------------------- Pain Assessment Details Patient Name: [name] Date of Service: 05/29/2016 8:00 AM Medical Record Number: [id number] Patient Account Number: [id number] Date of Birth/Sex: 08-25-1966 (50 y.o. Male) Treating RN: Afful, RN, BSN, [place] Sink Primary Care Physician: Angus Palms Other Clinician: Referring Physician: Angus Palms Treating Physician/Extender: Elayne Snare in Treatment: 12 Active Problems Location of Pain Severity and Description of Pain Patient Has Paino No Site Locations With Dressing Change: No Pain Management and Medication Current Pain Management: Electronic Signature(s) Signed: 05/29/2016 4:12:24 PM By: Elpidio Eric BSN, RN Entered By: Elpidio Eric on 05/29/2016 08:07:42 Mchale, Jens Som ([id number]) -------------------------------------------------------------------------------- Patient/Caregiver Education Details Patient Name: [name] Date of Service: 05/29/2016 8:00 AM Medical Record Number: [id number] Patient Account Number: [id number] Date of Birth/Gender: 02/02/1966 (50 y.o. Male) Treating RN: Afful, RN, BSN, [place] Sink Primary Care Physician: Angus Palms Other Clinician: Referring Physician: Angus Palms Treating Physician/Extender: Elayne Snare in Treatment: 12 Education Assessment Education Provided To: Patient Education Topics Provided Basic Hygiene: Methods: Explain/Verbal Responses: State content correctly Pressure: Methods: Explain/Verbal Responses: State content correctly Safety: Methods: Explain/Verbal Responses: State content correctly Welcome To The Wound Care Center: Methods: Explain/Verbal Responses: State content correctly Wound/Skin Impairment: Methods: Explain/Verbal Responses: State content correctly Electronic Signature(s) Signed: 05/29/2016 4:50:06 PM By: Ardath Sax MD Entered By: Ardath Sax on  05/29/2016 08:51:43 Freshour, Aariv A. ([id number]) -------------------------------------------------------------------------------- Wound Assessment Details Patient Name: [name] Date of Service: 05/29/2016 8:00 AM Medical Record Number: [id number] Patient Account Number: [id number] Date of Birth/Sex: 1966-05-07 (50 y.o. 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 Wound Open  Status: Wounding Event: Pressure Injury Comorbid Chronic sinus problems/congestion, Date Acquired: 06/26/2015 History: Anemia, Asthma, Sleep Apnea, Coronary Weeks Of Treatment: 12 Artery Disease, Hypertension, Myocardial Clustered Wound: No Infarction, Type II Diabetes, History of pressure wounds, Gout, Neuropathy Photos Photo Uploaded By: Elpidio EricAfful, Rita on 05/29/2016 12:37:52 Wound Measurements Length: (cm) 4 Width: (cm) 8 Depth: (cm) 0.2 Area: (cm) 25.133 Volume: (cm) 5.027 % Reduction in Area: 11.1% % Reduction in Volume: 11.1% Epithelialization: None Tunneling: No Undermining: No Wound Description Classification: Grade 2 Foul Odor Aft Wound Margin: Thickened Due to Produc Exudate Amount: Large Exudate Type: Purulent Exudate Color: yellow, brown, green er Cleansing: Yes t Use: No Wound Bed Granulation Amount: Large (67-100%) Exposed Structure Granulation Quality: Pink Fascia Exposed: No Suber, Efstathios A. ([id number]) Necrotic Amount: Small (1-33%) Fat Layer Exposed: No Necrotic Quality: Adherent Slough Tendon Exposed: No Muscle Exposed: No Joint Exposed: No Bone Exposed: No Limited to Skin Breakdown Periwound Skin Texture Texture Color No Abnormalities Noted: No No  Abnormalities Noted: No Callus: No Atrophie Blanche: No Crepitus: No Cyanosis: No Excoriation: No Ecchymosis: No Fluctuance: No Erythema: No Friable: No Hemosiderin Staining: No Induration: No Mottled: No Localized Edema: No Pallor: No Rash: No Rubor: No Scarring: Yes Temperature / Pain Moisture Temperature: No Abnormality No Abnormalities Noted: No Tenderness on Palpation: Yes Dry / Scaly: No Maceration: Yes Moist: Yes Wound Preparation Ulcer Cleansing: Rinsed/Irrigated with Saline Topical Anesthetic Applied: Other: lidocaine 4%, Treatment Notes Wound #3 (Left, Posterior Amputation Site - Below Knee) 1. Cleansed with: Clean wound with Normal Saline 4. Dressing Applied: Aquacel Ag 5. Secondary Dressing Applied Dry Gauze Kerlix/Conform 6. Footwear/Offloading device applied Felt/Foam 7. Secured with Tape Notes felt callous pad to periwound of rt. plantar foot wound Electronic Signature(s) Idamae SchullerHILLIPS, Yasser A. ([id number]) Signed: 05/29/2016 4:12:24 PM By: Elpidio EricAfful, Rita BSN, RN Entered By: Elpidio EricAfful, Rita on 05/29/2016 08:18:19 Blumstein, Cleatus A. ([id number]) -------------------------------------------------------------------------------- Wound Assessment Details Patient Name: Claudell KylePHILLIPS, Madhav A. Date of Service: 05/29/2016 8:00 AM Medical Record Number: [id number] Patient Account Number: [id number] Date of Birth/Sex: Jun 10, 1966 (50 y.o. Male) Treating RN: Afful, RN, BSN, [place] Sinkita Primary Care Physician: Angus PalmsGEORGE, SIONNE Other Clinician: Referring Physician: Angus PalmsGEORGE, SIONNE Treating Physician/Extender: Elayne SnarePARKER, PETER Weeks in Treatment: 12 Wound Status Wound Number: 4 Primary Diabetic Wound/Ulcer of the Lower Etiology: Extremity Wound Location: Right Foot - Plantar, Medial Wound Open Wounding Event: Gradually Appeared Status: Date Acquired: 03/28/2016 Comorbid Chronic sinus problems/congestion, Weeks Of Treatment: 4 History: Anemia, Asthma, Sleep Apnea,  Coronary Clustered Wound: No Artery Disease, Hypertension, Myocardial Infarction, Type II Diabetes, History of pressure wounds, Gout, Neuropathy Photos Photo Uploaded By: Elpidio EricAfful, Rita on 05/29/2016 12:38:13 Wound Measurements Length: (cm) 1 Width: (cm) 0.4 Depth: (cm) 0.2 Area: (cm) 0.314 Volume: (cm) 0.063 % Reduction in Area: 73.3% % Reduction in Volume: 46.6% Epithelialization: None Tunneling: No Undermining: No Wound Description Classification: Grade 1 Foul Odor Aft Wound Margin: Thickened Exudate Amount: Medium Exudate Type: Serosanguineous Exudate Color: red, brown er Cleansing: No Wound Bed Granulation Amount: Large (67-100%) Exposed Structure Granulation Quality: Red Fascia Exposed: No Carlino, Havard A. ([id number]) Necrotic Amount: None Present (0%) Fat Layer Exposed: No Tendon Exposed: No Muscle Exposed: No Joint Exposed: No Bone Exposed: No Limited to Skin Breakdown Periwound Skin Texture Texture Color No Abnormalities Noted: No No Abnormalities Noted: No Callus: Yes Atrophie Blanche: No Crepitus: No Cyanosis: No Excoriation: No Ecchymosis: No Fluctuance: No Erythema: No Friable: No Hemosiderin Staining: No Induration: No Mottled: No Localized Edema: No Pallor: No Rash: No Rubor: No Scarring: No Temperature /  Pain Moisture Temperature: No Abnormality No Abnormalities Noted: No Dry / Scaly: Yes Maceration: No Moist: No Wound Preparation Ulcer Cleansing: Rinsed/Irrigated with Saline Topical Anesthetic Applied: Other: Lidocaine 4%, Treatment Notes Wound #4 (Right, Medial, Plantar Foot) 1. Cleansed with: Clean wound with Normal Saline 4. Dressing Applied: Aquacel Ag 5. Secondary Dressing Applied Dry Gauze Kerlix/Conform 6. Footwear/Offloading device applied Felt/Foam 7. Secured with Tape Notes felt callous pad to periwound of rt. plantar foot wound Electronic Signature(s) WAH, SABIC ([id number]) Signed: 05/29/2016  4:12:24 PM By: Elpidio Eric BSN, RN Entered By: Elpidio Eric on 05/29/2016 08:19:13 Jeancharles, Finbar A. ([id number]) -------------------------------------------------------------------------------- Vitals Details Patient Name: [name] Date of Service: 05/29/2016 8:00 AM Medical Record Number: [id number] Patient Account Number: [id number] Date of Birth/Sex: [birth year] (50 y.o. Male) Treating RN: Afful, RN, BSN, [place] Sink Primary Care Physician: Angus Palms Other Clinician: Referring Physician: Angus Palms Treating Physician/Extender: Elayne Snare in Treatment: 12 Vital Signs Time Taken: 08:07 Temperature (F): 97.9 Height (in): 66 Pulse (bpm): 72 Weight (lbs): 280 Respiratory Rate (breaths/min): 16 Body Mass Index (BMI): 45.2 Blood Pressure (mmHg): 129/77 Reference Range: 80 - 120 mg / dl Electronic Signature(s) Signed: 05/29/2016 4:12:24 PM By: Elpidio Eric BSN, RN Entered By: Elpidio Eric on 05/29/2016 08:07:33

## 2016-06-02 ENCOUNTER — Ambulatory Visit: Payer: PPO | Admitting: *Deleted

## 2016-06-02 DIAGNOSIS — E1149 Type 2 diabetes mellitus with other diabetic neurological complication: Secondary | ICD-10-CM

## 2016-06-02 NOTE — Patient Instructions (Signed)

## 2016-06-02 NOTE — Progress Notes (Signed)
Patient ID: [name], male   DOB: 1966/01/22, 50 y.o.   MRN: [id number] Shoes are tried on and are not wide enough at 9.5 extra wide will return and reorder a 10 extra wide.  We will call [place] and [place].

## 2016-06-03 ENCOUNTER — Ambulatory Visit: Payer: PPO

## 2016-06-12 ENCOUNTER — Encounter: Payer: PPO | Admitting: Surgery

## 2016-06-12 DIAGNOSIS — L97412 Non-pressure chronic ulcer of right heel and midfoot with fat layer exposed: Secondary | ICD-10-CM | POA: Diagnosis not present

## 2016-06-12 DIAGNOSIS — E11622 Type 2 diabetes mellitus with other skin ulcer: Secondary | ICD-10-CM | POA: Diagnosis not present

## 2016-06-12 DIAGNOSIS — L97511 Non-pressure chronic ulcer of other part of right foot limited to breakdown of skin: Secondary | ICD-10-CM | POA: Diagnosis not present

## 2016-06-12 DIAGNOSIS — L89899 Pressure ulcer of other site, unspecified stage: Secondary | ICD-10-CM | POA: Diagnosis not present

## 2016-06-12 DIAGNOSIS — E11621 Type 2 diabetes mellitus with foot ulcer: Secondary | ICD-10-CM | POA: Diagnosis not present

## 2016-06-13 NOTE — Progress Notes (Signed)
KLINE, BULTHUIS ([id number]) Visit Report for 06/12/2016 Arrival Information Details Patient Name: [name], [name] Date of Service: 06/12/2016 8:00 AM Medical Record Number: [id number] Patient Account Number: [id number] Date of Birth/Sex: October 13, 1966 (50 y.o. Male) Treating RN: Afful, RN, BSN, [place] Sink Primary Care Physician: Angus Palms Other Clinician: Referring Physician: Angus Palms Treating Physician/Extender: Rudene Re in Treatment: 14 Visit Information History Since Last Visit Added or deleted any medications: No Patient Arrived: Ambulatory Any new allergies or adverse reactions: No Arrival Time: 08:11 Had a fall or experienced change in No Accompanied By: wife activities of daily living that may affect Transfer Assistance: None risk of falls: Patient Identification Verified: Yes Signs or symptoms of abuse/neglect since last No Secondary Verification Process Yes visito Completed: Hospitalized since last visit: No Patient Requires Transmission-Based No Has Dressing in Place as Prescribed: Yes Precautions: Pain Present Now: No Patient Has Alerts: No Electronic Signature(s) Signed: 06/12/2016 4:44:30 PM By: Elpidio Eric BSN, RN Entered By: Elpidio Eric on 06/12/2016 08:12:24 [place], Jens Som ([id number]) -------------------------------------------------------------------------------- Encounter Discharge Information Details Patient Name: Richard Kyle A. Date of Service: 06/12/2016 8:00 AM Medical Record Number: [id number] Patient Account Number: [id number] Date of Birth/Sex: 09/14/1966 (50 y.o. Male) Treating RN: Afful, RN, BSN, [place] Sink Primary Care Physician: Angus Palms Other Clinician: Referring Physician: Angus Palms Treating Physician/Extender: Rudene Re in Treatment: 62 Encounter Discharge Information Items Discharge Pain Level: 0 Discharge Condition: Stable Ambulatory Status: Ambulatory Discharge Destination:  Home Transportation: Private Auto Accompanied By: wife Schedule Follow-up Appointment: No Medication Reconciliation completed and provided to Patient/Care No Adonte Vanriper: Provided on Clinical Summary of Care: 06/12/2016 Form Type Recipient Paper Patient DP Electronic Signature(s) Signed: 06/12/2016 9:00:51 AM By: Gwenlyn Perking Entered By: Gwenlyn Perking on 06/12/2016 09:00:51 Huntsman, Jehiel A. ([id number]) -------------------------------------------------------------------------------- Lower Extremity Assessment Details Patient Name: Richard Kyle A. Date of Service: 06/12/2016 8:00 AM Medical Record Number: [id number] Patient Account Number: [id number] Date of Birth/Sex: 06-19-1966 (50 y.o. Male) Treating RN: Afful, RN, BSN, [place] Sink Primary Care Physician: Angus Palms Other Clinician: Referring Physician: Angus Palms Treating Physician/Extender: Rudene Re in Treatment: 14 Vascular Assessment Pulses: Posterior Tibial Dorsalis Pedis Palpable: [Right:Yes] Extremity colors, hair growth, and conditions: Extremity Color: [Right:Dusky] Hair Growth on Extremity: [Right:Yes] Temperature of Extremity: [Right:Warm] Capillary Refill: [Right:< 3 seconds] Toe Nail Assessment Left: Right: Thick: Yes Discolored: Yes Deformed: No Improper Length and Hygiene: No Electronic Signature(s) Signed: 06/12/2016 4:44:30 PM By: Elpidio Eric BSN, RN Entered By: Elpidio Eric on 06/12/2016 08:14:56 Rampersad, Sakai A. ([id number]) -------------------------------------------------------------------------------- Multi Wound Chart Details Patient Name: Richard Kyle A. Date of Service: 06/12/2016 8:00 AM Medical Record Number: [id number] Patient Account Number: [id number] Date of Birth/Sex: 11-Jun-1966 (50 y.o. 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] [4:the Lower Extremity] Comorbid History: [3:Chronic sinus problems/congestion, Anemia, Asthma, Sleep Anemia, Asthma, Sleep Apnea, Coronary Artery Apnea, Coronary Artery Disease, Hypertension, Myocardial Infarction, Type II Diabetes, History Type II Diabetes, History of  pressure wounds, Gout, of pressure wounds, Gout, Neuropathy] [4:Chronic sinus problems/congestion, Disease, Hypertension, Myocardial Infarction, Neuropathy] [N/A:N/A] Date  Acquired: [3:06/26/2015] [4:03/28/2016] [N/A:N/A] Weeks of Treatment: [3:14] [4:6] [N/A:N/A] Wound Status: [3:Open] [4:Open] [N/A:N/A] Measurements L x W x D 4x8.1x0.2 [4:1.5x1x0.4] [N/A:N/A] (cm) Area (cm) : [3:25.447] [4:1.178] [N/A:N/A] Volume (cm) : [3:5.089] [4:0.471] [N/A:N/A] % Reduction in Area: [3:10.00%] [4:0.00%] [N/A:N/A] % Reduction in Volume: 10.00% [4:-299.20%] [N/A:N/A] Classification: [3:Grade 2] [4:Grade 1] [N/A:N/A] Exudate Amount: [3:Large] [4:Medium] [N/A:N/A] Exudate Type: [3:Purulent] [4:Serosanguineous] [N/A:N/A] Exudate Color: [3:yellow, brown, green] [4:red, brown] [N/A:N/A] Foul Odor After [3:Yes] [4:No] [N/A:N/A] Cleansing: Canova, Gaje A. ([id number]) Odor Anticipated Due to No N/A N/A Product Use: Wound Margin: Thickened Thickened N/A Granulation Amount: Large (67-100%) Large (67-100%) N/A Granulation Quality: Pink Red N/A Necrotic Amount: Small (1-33%) None Present (0%) N/A Exposed  Structures: Fascia: No Fascia: No N/A Fat: No Fat: No Tendon: No Tendon: No Muscle: No Muscle: No Joint: No Joint: No Bone: No Bone: No Limited to Skin Limited to Skin Breakdown Breakdown Epithelialization: None None N/A Periwound Skin Texture: Scarring: Yes Callus: Yes N/A Edema: No Edema: No Excoriation: No Excoriation: No Induration: No Induration: No Callus: No Crepitus: No Crepitus: No Fluctuance: No Fluctuance: No Friable: No Friable: No Rash: No Rash: No Scarring: No Periwound Skin Maceration: Yes Moist: Yes N/A Moisture: Moist: Yes Dry/Scaly: Yes Dry/Scaly: No Maceration: No Periwound Skin Color: Atrophie Blanche: No Atrophie Blanche: No N/A Cyanosis: No Cyanosis: No Ecchymosis: No Ecchymosis: No Erythema: No Erythema: No Hemosiderin Staining: No Hemosiderin Staining: No Mottled: No Mottled: No Pallor: No Pallor: No Rubor: No Rubor: No Temperature: No Abnormality No Abnormality N/A Tenderness on Yes No N/A Palpation: Wound Preparation: Ulcer Cleansing: Ulcer Cleansing: N/A Rinsed/Irrigated with Rinsed/Irrigated with Saline Saline Topical Anesthetic Topical Anesthetic Applied: None Applied: Other: Lidocaine 4% Treatment Notes Electronic Signature(s) JAECOB, LOWDEN ([id number]) Signed: 06/12/2016 4:44:30 PM By: Elpidio Eric BSN, RN Entered By: Elpidio Eric on 06/12/2016 08:40:55 Buccellato, Jens Som ([id number]) -------------------------------------------------------------------------------- Multi-Disciplinary Care Plan Details Patient Name: Richard Kyle A. Date of Service: 06/12/2016 8:00 AM Medical Record Number: [id number] Patient Account Number: [id number] Date of Birth/Sex: October 07, 1966 (50 y.o. Male) Treating RN: Afful, RN, BSN, [place] Sink Primary Care Physician: Angus Palms Other Clinician: Referring Physician: Angus Palms Treating Physician/Extender: Rudene Re in Treatment: 12 Active Inactive Abuse / Safety /  Falls / Self Care Management Nursing Diagnoses: Knowledge deficit related to: safety; personal, health (wound), emergency Potential for falls Self care deficit: actual or potential Goals: Patient will remain injury free Date Initiated: 03/06/2016 Goal Status: Active Patient/caregiver will verbalize understanding of skin care regimen Date Initiated: 03/06/2016 Goal Status: Active Patient/caregiver will verbalize/demonstrate measure taken to improve self care Date Initiated: 03/06/2016 Goal Status: Active Patient/caregiver will verbalize/demonstrate measures taken to improve the patient's personal safety Date Initiated: 03/06/2016 Goal Status: Active Patient/caregiver will verbalize/demonstrate measures taken to prevent injury and/or falls Date Initiated: 03/06/2016 Goal Status: Active Patient/caregiver will verbalize/demonstrate understanding of what to do in case of emergency Date Initiated: 03/06/2016 Goal Status: Active Interventions: Assess fall risk on admission and as needed Assess: immobility, friction, shearing, incontinence upon admission and as needed Assess impairment of mobility on admission and as needed per policy Assess self care needs on admission and as needed Patient referred to community resources (specify in notes) Provide education on basic hygiene Provide education on fall prevention Braaten, Sohail A. ([id number]) Provide education on personal and home safety Provide education on safe transfers Treatment Activities: Education provided on Basic Hygiene : 05/29/2016 Notes: Orientation to the Wound Care Program Nursing Diagnoses: Knowledge deficit related to the wound healing center program Goals:  Patient/caregiver will verbalize understanding of the Wound Healing Center Program Date Initiated: 03/06/2016 Goal Status: Active Interventions: Provide education on orientation to the wound center Notes: Pressure Nursing Diagnoses: Knowledge deficit related to  causes and risk factors for pressure ulcer development Knowledge deficit related to management of pressures ulcers Potential for impaired tissue integrity related to pressure, friction, moisture, and shear Goals: Patient will remain free from development of additional pressure ulcers Date Initiated: 03/06/2016 Goal Status: Active Patient will remain free of pressure ulcers Date Initiated: 03/06/2016 Goal Status: Active Patient/caregiver will verbalize risk factors for pressure ulcer development Date Initiated: 03/06/2016 Goal Status: Active Patient/caregiver will verbalize understanding of pressure ulcer management Date Initiated: 03/06/2016 Goal Status: Active Interventions: Assess: immobility, friction, shearing, incontinence upon admission and as needed Assess offloading mechanisms upon admission and as needed [place], Kyre A. ([id number]) Assess potential for pressure ulcer upon admission and as needed Provide education on pressure ulcers Notes: Wound/Skin Impairment Nursing Diagnoses: Impaired tissue integrity Knowledge deficit related to smoking impact on wound healing Knowledge deficit related to ulceration/compromised skin integrity Goals: Patient/caregiver will verbalize understanding of skin care regimen Date Initiated: 03/06/2016 Goal Status: Active Ulcer/skin breakdown will have a volume reduction of 30% by week 4 Date Initiated: 03/06/2016 Goal Status: Active Ulcer/skin breakdown will have a volume reduction of 50% by week 8 Date Initiated: 03/06/2016 Goal Status: Active Ulcer/skin breakdown will have a volume reduction of 80% by week 12 Date Initiated: 03/06/2016 Goal Status: Active Ulcer/skin breakdown will heal within 14 weeks Date Initiated: 03/06/2016 Goal Status: Active Interventions: Assess patient/caregiver ability to obtain necessary supplies Assess patient/caregiver ability to perform ulcer/skin care regimen upon admission and as needed Assess  ulceration(s) every visit Provide education on ulcer and skin care Treatment Activities: Referred to DME Gatha Mcnulty for dressing supplies : 03/06/2016 Skin care regimen initiated : 03/06/2016 Topical wound management initiated : 03/06/2016 Notes: Electronic Signature(s) Signed: 06/12/2016 4:44:30 PM By: Elpidio Eric BSN, RN Entered By: Elpidio Eric on 06/12/2016 08:40:46 Okuda, Dmarius A. ([id number]) Cherubini, Nichole AMarland Kitchen ([id number]) -------------------------------------------------------------------------------- Pain Assessment Details Patient Name: Richard Kyle A. Date of Service: 06/12/2016 8:00 AM Medical Record Number: [id number] Patient Account Number: [id number] Date of Birth/Sex: [birth year] (50 y.o. Male) Treating RN: Afful, RN, BSN, [place] Sink Primary Care Physician: Angus Palms Other Clinician: Referring Physician: Angus Palms Treating Physician/Extender: Rudene Re in Treatment: 14 Active Problems Location of Pain Severity and Description of Pain Patient Has Paino No Site Locations With Dressing Change: No Pain Management and Medication Current Pain Management: Electronic Signature(s) Signed: 06/12/2016 4:44:30 PM By: Elpidio Eric BSN, RN Entered By: Elpidio Eric on 06/12/2016 08:12:32 Murakami, Jens Som ([id number]) -------------------------------------------------------------------------------- Patient/Caregiver Education Details Patient Name: Richard Kyle A. Date of Service: 06/12/2016 8:00 AM Medical Record Number: [id number] Patient Account Number: [id number] Date of Birth/Gender: Jun 05, 1966 (50 y.o. Male) Treating RN: Afful, RN, BSN, [place] Sink Primary Care Physician: Angus Palms Other Clinician: Referring Physician: Angus Palms Treating Physician/Extender: Rudene Re in Treatment: 14 Education Assessment Education Provided To: Patient Education Topics Provided Basic Hygiene: Methods: Explain/Verbal Responses: State content  correctly Pressure: Methods: Explain/Verbal Responses: State content correctly Safety: Methods: Explain/Verbal Responses: State content correctly Welcome To The [facility] Center: Methods: Explain/Verbal Responses: State content correctly Wound/Skin Impairment: Methods: Explain/Verbal Responses: State content correctly Electronic Signature(s) Signed: 06/12/2016 4:44:30 PM By: Elpidio Eric BSN, RN Entered By: Elpidio Eric on 06/12/2016 08:56:17 Aldaz, Zimir A. ([id number]) -------------------------------------------------------------------------------- Wound Assessment Details Patient Name: Richard Kyle A. Date of Service: 06/12/2016 8:00 AM Medical Record Number: [id number]  Patient Account Number: [id number] Date of Birth/Sex: 11/19/1965 (50 y.o. 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 Wound Open Status: Wounding Event: Pressure Injury Comorbid Chronic sinus problems/congestion, Date Acquired: 06/26/2015 History: Anemia, Asthma, Sleep Apnea, Coronary Weeks Of Treatment: 14 Artery Disease, Hypertension, Myocardial Clustered Wound: No Infarction, Type II Diabetes, History of pressure wounds, Gout, Neuropathy Photos Photo Uploaded By: Elpidio Eric on 06/12/2016 16:19:03 Wound Measurements Length: (cm) 4 Width: (cm) 8.1 Depth: (cm) 0.2 Area: (cm) 25.447 Volume: (cm) 5.089 % Reduction in Area: 10% % Reduction in Volume: 10% Epithelialization: None Tunneling: No Undermining: No Wound Description Classification: Grade 2 Foul Odor Aft Wound Margin: Thickened Due to Produc Exudate Amount: Large Exudate Type: Purulent Exudate Color: yellow, brown, green er  Cleansing: Yes t Use: No Wound Bed Granulation Amount: Large (67-100%) Exposed Structure Granulation Quality: Pink Fascia Exposed: No Beechy, Travares A. ([id number]) Necrotic Amount: Small (1-33%) Fat Layer Exposed: No Necrotic Quality: Adherent Slough Tendon Exposed: No Muscle Exposed: No Joint Exposed: No Bone Exposed: No Limited to Skin Breakdown Periwound Skin Texture Texture Color No Abnormalities Noted: No No Abnormalities Noted: No Callus: No Atrophie Blanche: No Crepitus: No Cyanosis: No Excoriation: No Ecchymosis: No Fluctuance: No Erythema: No Friable: No Hemosiderin Staining: No Induration: No Mottled: No Localized Edema: No Pallor: No Rash: No Rubor: No Scarring: Yes Temperature / Pain Moisture Temperature: No Abnormality No Abnormalities Noted: No Tenderness on Palpation: Yes Dry / Scaly: No Maceration: Yes Moist: Yes Wound Preparation Ulcer Cleansing: Rinsed/Irrigated with Saline Topical Anesthetic Applied: None Treatment Notes Wound #3 (Left, Posterior Amputation Site - Below Knee) 1. Cleansed with: Clean wound with Normal Saline 4. Dressing Applied: Aquacel Ag 5. Secondary Dressing Applied Dry Gauze Kerlix/Conform 6. Footwear/Offloading device applied Felt/Foam 7. Secured with Tape Notes felt callous pad to periwound of rt. plantar foot wound Electronic Signature(s) LAWSON, MAHONE ([id number]) Signed: 06/12/2016 4:44:30 PM By: Elpidio Eric BSN, RN Entered By: Elpidio Eric on 06/12/2016 08:24:04 Kestenbaum, Aadan A. ([id number]) -------------------------------------------------------------------------------- Wound Assessment Details Patient Name: Richard Kyle A. Date of Service: 06/12/2016 8:00 AM Medical Record Number: [id number] Patient Account Number: [id number] Date of Birth/Sex: Mar 21, 1966 (50 y.o. Male) Treating RN: Afful, RN, BSN, [place] Sink Primary Care Physician: Angus Palms Other Clinician: Referring Physician: Angus Palms Treating Physician/Extender: Rudene Re in Treatment: 14 Wound Status Wound Number: 4 Primary Diabetic Wound/Ulcer of the Lower Etiology: Extremity Wound Location: Right Foot - Plantar, Medial Wound Open Wounding Event: Gradually Appeared Status: Date Acquired: 03/28/2016 Comorbid Chronic sinus problems/congestion, Weeks Of Treatment: 6 History: Anemia, Asthma, Sleep Apnea, Coronary Clustered Wound: No Artery Disease, Hypertension, Myocardial Infarction, Type II Diabetes, History of pressure wounds, Gout, Neuropathy Photos Photo Uploaded By: Elpidio Eric on 06/12/2016 16:19:24 Wound Measurements Length: (cm) 1.5 Width: (cm) 1 Depth: (cm) 0.4 Area: (cm) 1.178 Volume: (cm) 0.471 % Reduction in Area: 0% % Reduction in Volume: -299.2% Epithelialization: None Tunneling: No Undermining: No Wound Description Classification: Grade 1 Foul Odor Aft Wound Margin: Thickened Exudate Amount: Medium Exudate Type: Serosanguineous Exudate Color: red, brown er Cleansing: No Wound Bed Granulation Amount: Large (67-100%) Exposed Structure Granulation Quality: Red Fascia Exposed: No Homann, Meredith A. ([id number]) Necrotic Amount: None Present (0%) Fat Layer Exposed: No Tendon Exposed: No Muscle Exposed: No Joint Exposed: No  Bone Exposed: No Limited to Skin Breakdown Periwound Skin Texture Texture Color No Abnormalities Noted: No No Abnormalities Noted: No Callus: Yes Atrophie Blanche: No Crepitus: No Cyanosis: No Excoriation: No Ecchymosis: No Fluctuance: No Erythema: No Friable: No Hemosiderin Staining: No Induration: No Mottled: No Localized Edema: No Pallor: No Rash: No Rubor: No Scarring: No Temperature / Pain Moisture Temperature: No Abnormality No Abnormalities Noted: No Dry / Scaly: Yes Maceration: No Moist: Yes Wound Preparation Ulcer Cleansing: Rinsed/Irrigated with Saline Topical Anesthetic Applied: Other: Lidocaine 4%, Treatment  Notes Wound #4 (Right, Medial, Plantar Foot) 1. Cleansed with: Clean wound with Normal Saline 4. Dressing Applied: Aquacel Ag 5. Secondary Dressing Applied Dry Gauze Kerlix/Conform 6. Footwear/Offloading device applied Felt/Foam 7. Secured with Tape Notes felt callous pad to periwound of rt. plantar foot wound Electronic Signature(s) HAVOC, SANLUIS ([id number]) Signed: 06/12/2016 4:44:30 PM By: Elpidio Eric BSN, RN Entered By: Elpidio Eric on 06/12/2016 08:24:26 Shryock, Eldwin A. ([id number]) -------------------------------------------------------------------------------- Vitals Details Patient Name: Richard Kyle A. Date of Service: 06/12/2016 8:00 AM Medical Record Number: [id number] Patient Account Number: [id number] Date of Birth/Sex: [birth year] (50 y.o. Male) Treating RN: Afful, RN, BSN, [place] Sink Primary Care Physician: Angus Palms Other Clinician: Referring Physician: Angus Palms Treating Physician/Extender: Rudene Re in Treatment: 14 Vital Signs Time Taken: 08:15 Temperature (F): 98.1 Height (in): 66 Pulse (bpm): 70 Weight (lbs): 280 Respiratory Rate (breaths/min): 16 Body Mass Index (BMI): 45.2 Blood Pressure (mmHg): 111/70 Reference Range: 80 - 120 mg / dl Electronic Signature(s) Signed: 06/12/2016 4:44:30 PM By: Elpidio Eric BSN, RN Entered By: Elpidio Eric on 06/12/2016 08:15:34

## 2016-06-13 NOTE — Progress Notes (Signed)
Richard, [name] ([id number]) Visit Report for 06/12/2016 Chief Complaint Document Details Patient Name: [name], [name] Date of Service: 06/12/2016 8:00 AM Medical Record Number: [id number] Patient Account Number: [id number] Date of Birth/Sex: [birth year] (50 y.o. Male) Treating RN: Afful, RN, BSN, [place] Primary Care Physician: Angus Palms Other Clinician: Referring Physician: Angus Palms Treating Physician/Extender: Rudene Re in Treatment: 14 Information Obtained from: Patient Chief Complaint Patients presents for treatment of an open diabetic ulcer and pressure injury to his left lower thigh posteriorly for about a year. he is back after her recent consultation at [facility] outpatient prosthetic center Electronic Signature(s) Signed: 06/12/2016 9:08:03 AM By: Evlyn Kanner MD, FACS Entered By: Evlyn Kanner on 06/12/2016 09:08:03 Broccoli, Giang AMarland Kitchen ([id number]) -------------------------------------------------------------------------------- Debridement Details Patient Name: Richard Kyle A. Date of Service: 06/12/2016 8:00 AM Medical Record Number: [id number] Patient Account Number: [id number] Date of Birth/Sex: [birth year] (50 y.o. Male) Treating RN: Afful, RN, BSN, [place] Sink Primary Care Physician: Angus Palms Other Clinician: Referring Physician: Angus Palms Treating Physician/Extender: Rudene Re in Treatment: 14 Debridement Performed for Wound #4 Right,Medial,Plantar Foot Assessment: Performed By: Physician Evlyn Kanner, MD Debridement: Debridement Pre-procedure Yes Verification/Time Out Taken: Start Time: 08:38 Pain Control: Lidocaine 4% Topical Solution Level: Skin/Subcutaneous Tissue Total Area Debrided (L x 1.5 (cm) x 1 (cm) = 1.5 (cm) W): Tissue and other Non-Viable, Callus, Fibrin/Slough, Skin, Subcutaneous material debrided: Instrument: Forceps, Scissors Bleeding: Minimum Hemostasis Achieved: Pressure End Time:  08:42 Procedural Pain: 0 Post Procedural Pain: 0 Response to Treatment: Procedure was tolerated well Post Debridement Measurements of Total Wound Length: (cm) 1.5 Width: (cm) 1 Depth: (cm) 0.2 Volume: (cm) 0.236 Post Procedure Diagnosis Same as Pre-procedure Electronic Signature(s) Signed: 06/12/2016 9:07:55 AM By: Evlyn Kanner MD, FACS Signed: 06/12/2016 4:44:30 PM By: Elpidio Eric BSN, RN Entered By: Evlyn Kanner on 06/12/2016 09:07:55 Pae, Christopher A. ([id number]) -------------------------------------------------------------------------------- HPI Details Patient Name: Richard Kyle A. Date of Service: 06/12/2016 8:00 AM Medical Record Number: [id number] Patient Account Number: [id number] Date of Birth/Sex: Dec 26, 1965 (50 y.o. Male) Treating RN: Afful, RN, BSN, [place] Sink Primary Care Physician: Angus Palms Other Clinician: Referring Physician: Angus Palms Treating Physician/Extender: Rudene Re in Treatment: 14 History of Present Illness Location: left lower thigh posteriorly Quality: Patient reports experiencing a dull pain to affected area(s). Severity: Patient states wound are getting worse. 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 HPI Description: 50 year old gentleman with past medical history of diabetes mellitus with last hemoglobin A1c done in February 2016 was 8.7, hypertension, hypothyroidism, coronary artery disease, ischemic cardiomyopathy , nephropathy, obesity, peripheral neuropathy, peripheral vascular disease and sleep apnea. 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 He has had problems with his left BKA prosthesis for a long while and had a vacuum system and then had to be changed over to a sleeve system and has been working with the orthopedic and prosthetic department at [facility]. He has also had a plastic surgery opinion in the past. The ulceration on the posterior part  of his lower thigh continued to be aggravating an enlarging and have a foul order and drainage. He works as a Financial risk analyst at [employer] and has to wear his prostheses and standing for long periods of time as he is the only finding member in the family 03/06/2016 -- the notes from the [facility] and was seen by Ms. Lubertha Basque the PA whonoted that he continue to wear his prosthesis against medical advice. details from his past surgical history notes that he's had a cardiac catheterization, coronary angioplasty, amputation of the left leg in 2012, debridement of skin and subcutis tissue in June 2013, application of wound VAC at that time, below knee amputation revision in April 2014. Recommendation was for a new socket to be made which should be above the new wound. He also recommended surgical revision of the redundant skin and to stay off the prosthesis to the wound heals but the patient would not be able to do this because of work. Hence he was advised to establish with a local wound clinic for management of his current wound and follow-up with the amputee clinic at [facility] or with Dr. Mindi Curling. If he decided to go back to them for surgery he should see Dr. Lanier Ensign. 03/27/2016 -- he was unable to get his prosthesis made at the place where they had begun to mold and hence he has to find himself a mother prosthetic department and is working on this. 04/10/2016 -- on his left popliteal fossa area he has got a small  satellite abrasion with an ulceration which was caused by pulling off the socket on his skin. He still has to see his prosthetic department. KHYLEN, KNAUER ([id number]) 05/01/2016 -- today he comes with a new problem on his right foot which he says he's had for about 2 months and has been treated by his podiatrist Dr. Ovid Curd. Says due to the fact that his footwear and insoles were worn out, he has developed an ulcerated area which continues to drain sero- sanguinous fluid and has been on doxycycline for about 2 months. He saw Dr. Ardelle Anton who did an x-ray of the foot and has been following up with him for a long while regarding this and managing his Charcot's arthropathy right foot. Today the patient wanted me to take a look at it and agrees to undergo treatment for this. 05/08/2016 -- I have reviewed notes from Dr. Vaughan Sine who saw him on 05/06/2016. after review he asked him to continue with local wound care and started him on ciprofloxacin as well as his doxycycline. note is made of the fact that on his previous visit on 02/01/2016 x-rays were done which showed chronic arthritic changes present in the rear foot due to Charcot deformity. Vessel occification is present. 06/12/2016 -- the patient has not yet got his left below-knee prostheses sleeve which was to be made afresh. He has also not received his right foot orthotic shoe which was to be made at Dr. Kenna Gilbert office. It has been over 2 months since he keeps telling me that he is going to get this done soon. Electronic Signature(s) Signed: 06/12/2016 9:09:51 AM By: Evlyn Kanner MD, FACS Entered By: Evlyn Kanner on 06/12/2016 09:09:51 Roehr, Jens Som ([id number]) -------------------------------------------------------------------------------- Physical Exam Details Patient Name: Richard Kyle A. Date of Service: 06/12/2016 8:00 AM Medical Record Number: [id number] Patient Account Number: [id number] Date of  Birth/Sex: [birth year] (50 y.o. Male) Treating RN: Afful, RN, BSN, [place] Sink Primary Care Physician: Angus Palms Other Clinician:  Referring Physician: Angus Palms Treating Physician/Extender: Rudene Re in Treatment: 14 Constitutional . Pulse regular. Respirations normal and unlabored. Afebrile. . Eyes Nonicteric. Reactive to light. Ears, Nose, Mouth, and Throat Lips, teeth, and gums WNL.Marland Kitchen Moist mucosa without lesions. Neck supple and nontender. No palpable supraclavicular or cervical adenopathy. Normal sized without goiter. Respiratory WNL. No retractions.. Cardiovascular Pedal Pulses WNL. No clubbing, cyanosis or edema. Lymphatic No adneopathy. No adenopathy. No adenopathy. Musculoskeletal Adexa without tenderness or enlargement.. Digits and nails w/o clubbing, cyanosis, infection, petechiae, ischemia, or inflammatory conditions.. Integumentary (Hair, Skin) No suspicious lesions. No crepitus or fluctuance. No peri-wound warmth or erythema. No masses.Marland Kitchen Psychiatric Judgement and insight Intact.. No evidence of depression, anxiety, or agitation.. Notes the left lower thigh large ulceration is about the same with no deeper structures visible and the granulation tissue is fairly healthy but more on the fibrotic side. The right plantar ulceration in the middle of his foot needed sharp dissection with a forceps and scissors and the entire area was saucerized and the ulcer base freshened. Minimal bleeding controlled with pressure. Electronic Signature(s) Signed: 06/12/2016 9:11:30 AM By: Evlyn Kanner MD, FACS Entered By: Evlyn Kanner on 06/12/2016 09:11:30 Pinson, Jens Som ([id number]) -------------------------------------------------------------------------------- Physician Orders Details Patient Name: Richard Kyle A. Date of Service: 06/12/2016 8:00 AM Medical Record Number: [id number] Patient Account Number: [id number] Date of Birth/Sex: 10-18-1966 (50 y.o.  Male) Treating RN: Afful, RN, BSN, [place] Sink Primary Care Physician: Angus Palms Other Clinician: Referring Physician: Angus Palms Treating Physician/Extender: Rudene Re in Treatment: 85 Verbal / Phone Orders: Yes Clinician: Afful, RN, BSN, Rita Read Back and Verified: Yes Diagnosis Coding Wound Cleansing Wound #3 Left,Posterior Amputation Site - Below Knee o Cleanse wound with mild soap and water o May Shower, gently pat wound dry prior to applying new dressing. o May shower with protection. Wound #4 Right,Medial,Plantar Foot o Cleanse wound with mild soap and water o May Shower, gently pat wound dry prior to applying new dressing. o May shower with protection. Anesthetic Wound #3 Left,Posterior Amputation Site - Below Knee o Topical Lidocaine 4% cream applied to wound bed prior to debridement Wound #4 Right,Medial,Plantar Foot o Topical Lidocaine 4% cream applied to wound bed prior to debridement Primary Wound Dressing Wound #3 Left,Posterior Amputation Site - Below Knee o Aquacel Ag Wound #4 Right,Medial,Plantar Foot o Aquacel Ag Secondary Dressing Wound #3 Left,Posterior Amputation Site - Below Knee o Gauze and Kerlix/Conform Wound #4 Right,Medial,Plantar Foot o Gauze and Kerlix/Conform Dressing Change Frequency Wound #3 Left,Posterior Amputation Site - Below Knee o Change dressing every day. Bogus, Ashly A. ([id number]) Wound #4 Right,Medial,Plantar Foot o Change dressing every day. Follow-up Appointments Wound #3 Left,Posterior Amputation Site - Below Knee o Return Appointment in 1 week. Off-Loading Wound #4 Right,Medial,Plantar Foot o Other: - Felt Additional Orders / Instructions Wound #3 Left,Posterior Amputation Site - Below Knee o Increase protein intake. o OK to return to work o OK to return to work with the following restrictions: o Activity as tolerated o Other: - Vitamin A, C, [employer], MVI Wound  #4 Right,Medial,Plantar Foot o Increase protein intake. o OK to return to work o OK to return to work with the following restrictions: o Activity as tolerated o Other: - Vitamin A, C, [employer], MVI Electronic Signature(s) Signed: 06/12/2016 4:12:41 PM By: Evlyn Kanner MD, FACS Signed: 06/12/2016 4:44:30 PM By: Elpidio Eric BSN, RN Entered By: Elpidio Eric on 06/12/2016 08:43:34 Haydu, Camara AMarland Kitchen ([id number]) -------------------------------------------------------------------------------- Problem List Details Patient Name: Richard Kyle A. Date  of Service: 06/12/2016 8:00 AM Medical Record Number: [id number] Patient Account Number: [id number] Date of Birth/Sex: Sep 15, 1966 (50 y.o. 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 ([id number]) -------------------------------------------------------------------------------- Progress Note Details Patient Name: Richard Kyle A. Date of Service: 06/12/2016 8:00 AM Medical Record Number: [id number] Patient Account Number: [id number] Date of Birth/Sex:  11-26-1965 (50 y.o. Male) Treating RN: Afful, RN, BSN, [place] Sink Primary Care Physician: Angus Palms Other Clinician: Referring Physician: Angus Palms Treating Physician/Extender: Rudene Re in Treatment: 14 Subjective Chief Complaint Information obtained from Patient Patients presents for treatment of an open diabetic ulcer and pressure injury to his left lower thigh posteriorly for about a year. he is back after her recent consultation at [facility] outpatient prosthetic center History of Present Illness (HPI) The following HPI elements were documented for the patient's wound: Location: left lower thigh posteriorly Quality: Patient reports experiencing a dull pain to affected area(s). Severity: Patient states wound are getting worse. 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 50 year old gentleman with past medical history of diabetes mellitus with last hemoglobin A1c done in February 2016 was 8.7, hypertension, hypothyroidism, coronary artery disease, ischemic cardiomyopathy , nephropathy, obesity, peripheral neuropathy, peripheral vascular disease and sleep apnea. 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 He has had problems with his left BKA prosthesis for a long while and had a vacuum system and then had to be changed over to a sleeve system and has been working with the orthopedic and prosthetic department  at [facility]. He has also had a plastic surgery opinion in the past. The ulceration on the posterior part of his lower thigh continued to be aggravating an enlarging and have a foul order and drainage. 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 Lubertha Basque the PA whonoted that he continue to wear his prosthesis against medical advice. details from his past surgical history notes that he's had a cardiac catheterization, coronary angioplasty, amputation of the left leg in 2012, debridement of skin and subcutis tissue in June 2013, application of wound VAC at  that time, below knee amputation revision in April 2014. Melick, Frank A. ([id number]) Recommendation was for a new socket to be made which should be above the new wound. He also recommended surgical revision of the redundant skin and to stay off the prosthesis to the wound heals but the patient would not be able to do this because of work. Hence he was advised to establish with a local wound clinic for management of his current wound and follow-up with the amputee clinic at [facility] or with Dr. Mindi Curling. If he decided to go back to them for surgery he should see Dr. Lanier Ensign. 03/27/2016 -- he was unable to get his prosthesis made at the place where they had begun to mold and hence he has to find himself a mother prosthetic department and is working on this. 04/10/2016 -- on his left popliteal fossa area he has got a small satellite abrasion with an ulceration which was caused by pulling off the socket on his skin. He still has to see his prosthetic department. 05/01/2016 -- today he comes with a new problem on his right foot which he says he's had for about 2 months and has been treated by his podiatrist Dr. Ovid Curd. Says due to the fact that his footwear and insoles were  worn out, he has developed an ulcerated area which continues to drain sero- sanguinous fluid and has been on doxycycline for about 2 months. He saw Dr. Ardelle Anton who did an x-ray of the foot and has been following up with him for a long while regarding this and managing his Charcot's arthropathy right foot. Today the patient wanted me to take a look at it and agrees to undergo treatment for this. 05/08/2016 -- I have reviewed notes from Dr. Vaughan Sine who saw him on 05/06/2016. after review he asked him to continue with local wound care and started him on ciprofloxacin as well as his doxycycline. note is made of the fact that on his previous visit on 02/01/2016 x-rays were done which showed chronic arthritic changes present in the rear foot due to Charcot deformity. Vessel occification is present. 06/12/2016 -- the patient has not yet got his left below-knee prostheses sleeve which was to be made afresh. He has also not received his right foot orthotic shoe which was to be made at Dr. Kenna Gilbert office. It has been over 2 months since he keeps telling me that he is going to get this done soon. Objective Constitutional Pulse regular. Respirations normal and unlabored. Afebrile. Vitals Time Taken: 8:15 AM, Height: 66 in, Weight: 280 lbs, BMI: 45.2, Temperature: 98.1 F, Pulse: 70 bpm, Respiratory Rate: 16 breaths/min, Blood Pressure: 111/70 mmHg. Eyes Nonicteric. Reactive to light. Ears, Nose, Mouth, and Throat Lips, teeth, and gums WNL.Marland Kitchen Moist mucosa without lesions. Neck supple and nontender. No palpable supraclavicular or cervical adenopathy. Normal sized without goiter. Respiratory WNL. No retractions.Marland Kitchen Leedy, Anes A. ([id number]) Cardiovascular Pedal Pulses WNL. No clubbing, cyanosis or edema. Lymphatic No adneopathy. No adenopathy. No adenopathy. Musculoskeletal Adexa without tenderness or enlargement.. Digits and nails w/o clubbing, cyanosis, infection, petechiae, ischemia,  or inflammatory conditions.Marland Kitchen Psychiatric Judgement and insight Intact.. No evidence of depression, anxiety, or agitation.. General Notes: the left lower thigh large ulceration is about the same with no deeper structures visible and the granulation tissue is fairly healthy but more on the fibrotic side. The right plantar ulceration in the middle of his foot needed sharp dissection with a forceps and scissors and the entire area was saucerized  and the ulcer base freshened. Minimal bleeding controlled with pressure. Integumentary (Hair, Skin) No suspicious lesions. No crepitus or fluctuance. No peri-wound warmth or erythema. No masses.. Wound #3 status is Open. Original cause of wound was Pressure Injury. The wound is located on the Left,Posterior Amputation Site - Below Knee. The wound measures 4cm length x 8.1cm width x 0.2cm depth; 25.447cm^2 area and 5.089cm^3 volume. The wound is limited to skin breakdown. There is no tunneling or undermining noted. There is a large amount of purulent drainage noted. The wound margin is thickened. There is large (67-100%) pink granulation within the wound bed. There is a small (1-33%) amount of necrotic tissue within the wound bed including Adherent Slough. The periwound skin appearance exhibited: Scarring, Maceration, Moist. The periwound skin appearance did not exhibit: Callus, Crepitus, Excoriation, Fluctuance, Friable, Induration, Localized Edema, Rash, Dry/Scaly, Atrophie Blanche, Cyanosis, Ecchymosis, Hemosiderin Staining, Mottled, Pallor, Rubor, Erythema. Periwound temperature was noted as No Abnormality. The periwound has tenderness on palpation. Wound #4 status is Open. Original cause of wound was Gradually Appeared. The wound is located on the Right,Medial,Plantar Foot. The wound measures 1.5cm length x 1cm width x 0.4cm depth; 1.178cm^2 area and 0.471cm^3 volume. The wound is limited to skin breakdown. There is no tunneling or undermining noted.  There is a medium amount of serosanguineous drainage noted. The wound margin is thickened. There is large (67-100%) red granulation within the wound bed. There is no necrotic tissue within the wound bed. The periwound skin appearance exhibited: Callus, Dry/Scaly, Moist. The periwound skin appearance did not exhibit: Crepitus, Excoriation, Fluctuance, Friable, Induration, Localized Edema, Rash, Scarring, Maceration, Atrophie Blanche, Cyanosis, Ecchymosis, Hemosiderin Staining, Mottled, Pallor, Rubor, Erythema. Periwound temperature was noted as No Abnormality. Assessment Sainvil, Cid A. ([id number]) Active Problems ICD-10 E11.622 - Type 2 diabetes mellitus with other skin ulcer L97.122 - Non-pressure chronic ulcer of left thigh with fat layer exposed M70.852 - Other soft tissue disorders related to use, overuse and pressure, left thigh E66.01 - Morbid (severe) obesity due to excess calories L97.412 - Non-pressure chronic ulcer of right heel and midfoot with fat layer exposed M14.671 - Charcot's joint, right ankle and foot Procedures Wound #4 Wound #4 is a Diabetic Wound/Ulcer of the Lower Extremity located on the Right,Medial,Plantar Foot . There was a Skin/Subcutaneous Tissue Debridement ([id number]) debridement with total area of 1.5 sq cm performed by Evlyn Kanner, MD. with the following instrument(s): Forceps and Scissors to remove Non- Viable tissue/material including Fibrin/Slough, Skin, Callus, and Subcutaneous after achieving pain control using Lidocaine 4% Topical Solution. A time out was conducted prior to the start of the procedure. A Minimum amount of bleeding was controlled with Pressure. The procedure was tolerated well with a pain level of 0 throughout and a pain level of 0 following the procedure. Post Debridement Measurements: 1.5cm length x 1cm width x 0.2cm depth; 0.236cm^3 volume. Post procedure Diagnosis Wound #4: Same as Pre-Procedure Plan Wound Cleansing: Wound  #3 Left,Posterior Amputation Site - Below Knee: Cleanse wound with mild soap and water May Shower, gently pat wound dry prior to applying new dressing. May shower with protection. Wound #4 Right,Medial,Plantar Foot: Cleanse wound with mild soap and water May Shower, gently pat wound dry prior to applying new dressing. May shower with protection. Anesthetic: Wound #3 Left,Posterior Amputation Site - Below Knee: Topical Lidocaine 4% cream applied to wound bed prior to debridement Wound #4 Right,Medial,Plantar Foot: Topical Lidocaine 4% cream applied to wound bed prior to debridement Primary Wound Dressing: Mapes, Croix A. (  [id number]) Wound #3 Left,Posterior Amputation Site - Below Knee: Aquacel Ag Wound #4 Right,Medial,Plantar Foot: Aquacel Ag Secondary Dressing: Wound #3 Left,Posterior Amputation Site - Below Knee: Gauze and Kerlix/Conform Wound #4 Right,Medial,Plantar Foot: Gauze and Kerlix/Conform Dressing Change Frequency: Wound #3 Left,Posterior Amputation Site - Below Knee: Change dressing every day. Wound #4 Right,Medial,Plantar Foot: Change dressing every day. Follow-up Appointments: Wound #3 Left,Posterior Amputation Site - Below Knee: Return Appointment in 1 week. Off-Loading: Wound #4 Right,Medial,Plantar Foot: Other: - Felt Additional Orders / Instructions: Wound #3 Left,Posterior Amputation Site - Below Knee: Increase protein intake. OK to return to work OK to return to work with the following restrictions: Activity as tolerated Other: - Vitamin A, C, [employer], MVI Wound #4 Right,Medial,Plantar Foot: Increase protein intake. OK to return to work OK to return to work with the following restrictions: Activity as tolerated Other: - Vitamin A, C, [employer], MVI For the left posterior thigh wound we will use silver alginate and an appropriate bandage over this to help him place his socket for his prostheses. On the right foot after sharp debridement we will use  silver alginate in the wound using an offloading felt around the ulcer and he will offload with his diabetic shoe. Control of his diabetes mellitus and nutrition has been emphasized. also urged him to work with his orthotic and prosthetic group to try and get his new sleeve and new orthotic shoe Reth, Arlen A. ([id number]) Electronic Signature(s) Signed: 06/12/2016 9:12:44 AM By: Evlyn Kanner MD, FACS Entered By: Evlyn Kanner on 06/12/2016 09:12:43 Milby, Blane A. ([id number]) -------------------------------------------------------------------------------- SuperBill Details Patient Name: Richard Kyle A. Date of Service: 06/12/2016 Medical Record Number: [id number] Patient Account Number: [id number] Date of Birth/Sex: May 19, 1966 (50 y.o. 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 M14.671 Charcot's joint, right ankle and foot Facility Procedures CPT4 Code Description: [id number] 11042 - DEB SUBQ TISSUE 20 SQ CM/< ICD-10 Description Diagnosis E11.622 Type 2 diabetes mellitus with other skin ulcer 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

## 2016-06-16 DIAGNOSIS — R0609 Other forms of dyspnea: Secondary | ICD-10-CM | POA: Diagnosis not present

## 2016-06-16 DIAGNOSIS — G894 Chronic pain syndrome: Secondary | ICD-10-CM | POA: Diagnosis not present

## 2016-06-16 DIAGNOSIS — Z6841 Body Mass Index (BMI) 40.0 and over, adult: Secondary | ICD-10-CM | POA: Diagnosis not present

## 2016-06-16 DIAGNOSIS — E1165 Type 2 diabetes mellitus with hyperglycemia: Secondary | ICD-10-CM | POA: Diagnosis not present

## 2016-06-16 DIAGNOSIS — I10 Essential (primary) hypertension: Secondary | ICD-10-CM | POA: Diagnosis not present

## 2016-06-16 DIAGNOSIS — I5022 Chronic systolic (congestive) heart failure: Secondary | ICD-10-CM | POA: Diagnosis not present

## 2016-06-16 DIAGNOSIS — Z89619 Acquired absence of unspecified leg above knee: Secondary | ICD-10-CM | POA: Diagnosis not present

## 2016-06-16 DIAGNOSIS — I25110 Atherosclerotic heart disease of native coronary artery with unstable angina pectoris: Secondary | ICD-10-CM | POA: Diagnosis not present

## 2016-06-16 DIAGNOSIS — N183 Chronic kidney disease, stage 3 (moderate): Secondary | ICD-10-CM | POA: Diagnosis not present

## 2016-06-17 ENCOUNTER — Ambulatory Visit (INDEPENDENT_AMBULATORY_CARE_PROVIDER_SITE_OTHER): Payer: PPO | Admitting: Podiatry

## 2016-06-17 ENCOUNTER — Encounter: Payer: Self-pay | Admitting: Podiatry

## 2016-06-17 DIAGNOSIS — L97511 Non-pressure chronic ulcer of other part of right foot limited to breakdown of skin: Secondary | ICD-10-CM

## 2016-06-17 DIAGNOSIS — E1149 Type 2 diabetes mellitus with other diabetic neurological complication: Secondary | ICD-10-CM

## 2016-06-17 NOTE — Progress Notes (Signed)
Reason significant diabetic shoes. However upon fitting the shoes they were not fitting. I will have him come back tomorrow to see [name] and we have tried now twice and not been able to get a good fit. He is getting frustrated in the length of time it has taken. He will not wear a surgical shoe or boot in the meantime to offload the wound. Unfortunately, some of the delay has been because of insurance and the shoes dd not come in. He would not take off his shoe for me to look at the wound or foot today as she states he goes to the wound care center and they are looking at the foot.

## 2016-06-18 ENCOUNTER — Ambulatory Visit (INDEPENDENT_AMBULATORY_CARE_PROVIDER_SITE_OTHER): Payer: PPO | Admitting: Podiatry

## 2016-06-18 DIAGNOSIS — E1149 Type 2 diabetes mellitus with other diabetic neurological complication: Secondary | ICD-10-CM

## 2016-06-18 DIAGNOSIS — L97511 Non-pressure chronic ulcer of other part of right foot limited to breakdown of skin: Secondary | ICD-10-CM

## 2016-06-20 DIAGNOSIS — E1165 Type 2 diabetes mellitus with hyperglycemia: Secondary | ICD-10-CM | POA: Diagnosis not present

## 2016-06-26 ENCOUNTER — Ambulatory Visit: Payer: PPO | Admitting: Surgery

## 2016-06-27 NOTE — Progress Notes (Signed)
Patient unhappy with the fit of his diabetics shoes. Betha re-measured and ordered a different size. Will contact pt once those arrive.

## 2016-07-07 DIAGNOSIS — I517 Cardiomegaly: Secondary | ICD-10-CM | POA: Diagnosis not present

## 2016-07-07 DIAGNOSIS — G4733 Obstructive sleep apnea (adult) (pediatric): Secondary | ICD-10-CM | POA: Diagnosis not present

## 2016-07-07 DIAGNOSIS — Z8614 Personal history of Methicillin resistant Staphylococcus aureus infection: Secondary | ICD-10-CM | POA: Diagnosis not present

## 2016-07-07 DIAGNOSIS — Z89619 Acquired absence of unspecified leg above knee: Secondary | ICD-10-CM | POA: Diagnosis not present

## 2016-07-07 DIAGNOSIS — R0609 Other forms of dyspnea: Secondary | ICD-10-CM | POA: Diagnosis not present

## 2016-07-07 DIAGNOSIS — R509 Fever, unspecified: Secondary | ICD-10-CM | POA: Diagnosis not present

## 2016-07-07 DIAGNOSIS — N184 Chronic kidney disease, stage 4 (severe): Secondary | ICD-10-CM | POA: Diagnosis not present

## 2016-07-07 DIAGNOSIS — I10 Essential (primary) hypertension: Secondary | ICD-10-CM | POA: Diagnosis not present

## 2016-07-07 DIAGNOSIS — I2510 Atherosclerotic heart disease of native coronary artery without angina pectoris: Secondary | ICD-10-CM | POA: Diagnosis not present

## 2016-07-07 DIAGNOSIS — I255 Ischemic cardiomyopathy: Secondary | ICD-10-CM | POA: Diagnosis not present

## 2016-07-16 DIAGNOSIS — G4733 Obstructive sleep apnea (adult) (pediatric): Secondary | ICD-10-CM | POA: Diagnosis not present

## 2016-07-17 ENCOUNTER — Encounter: Payer: PPO | Attending: Surgery | Admitting: Surgery

## 2016-07-17 DIAGNOSIS — L97122 Non-pressure chronic ulcer of left thigh with fat layer exposed: Secondary | ICD-10-CM | POA: Diagnosis not present

## 2016-07-17 DIAGNOSIS — I2510 Atherosclerotic heart disease of native coronary artery without angina pectoris: Secondary | ICD-10-CM | POA: Diagnosis not present

## 2016-07-17 DIAGNOSIS — E11622 Type 2 diabetes mellitus with other skin ulcer: Secondary | ICD-10-CM | POA: Insufficient documentation

## 2016-07-17 DIAGNOSIS — L97511 Non-pressure chronic ulcer of other part of right foot limited to breakdown of skin: Secondary | ICD-10-CM | POA: Diagnosis not present

## 2016-07-17 DIAGNOSIS — G4730 Sleep apnea, unspecified: Secondary | ICD-10-CM | POA: Diagnosis not present

## 2016-07-17 DIAGNOSIS — I255 Ischemic cardiomyopathy: Secondary | ICD-10-CM | POA: Diagnosis not present

## 2016-07-17 DIAGNOSIS — E1121 Type 2 diabetes mellitus with diabetic nephropathy: Secondary | ICD-10-CM | POA: Diagnosis not present

## 2016-07-17 DIAGNOSIS — I739 Peripheral vascular disease, unspecified: Secondary | ICD-10-CM | POA: Insufficient documentation

## 2016-07-17 DIAGNOSIS — M70852 Other soft tissue disorders related to use, overuse and pressure, left thigh: Secondary | ICD-10-CM | POA: Insufficient documentation

## 2016-07-17 DIAGNOSIS — L97821 Non-pressure chronic ulcer of other part of left lower leg limited to breakdown of skin: Secondary | ICD-10-CM | POA: Diagnosis not present

## 2016-07-17 DIAGNOSIS — I10 Essential (primary) hypertension: Secondary | ICD-10-CM | POA: Insufficient documentation

## 2016-07-17 DIAGNOSIS — M14671 Charcot's joint, right ankle and foot: Secondary | ICD-10-CM | POA: Insufficient documentation

## 2016-07-17 DIAGNOSIS — L97412 Non-pressure chronic ulcer of right heel and midfoot with fat layer exposed: Secondary | ICD-10-CM | POA: Insufficient documentation

## 2016-07-17 DIAGNOSIS — E1140 Type 2 diabetes mellitus with diabetic neuropathy, unspecified: Secondary | ICD-10-CM | POA: Diagnosis not present

## 2016-07-17 DIAGNOSIS — E039 Hypothyroidism, unspecified: Secondary | ICD-10-CM | POA: Diagnosis not present

## 2016-07-17 NOTE — Progress Notes (Addendum)
[name], Richard [name] ([id number]) Visit Report for 07/17/2016 Chief Complaint Document Details Patient Name: Richard, [name] [name]. Date of Service: 07/17/2016 8:00 AM Medical Record Number: [id number] Patient Account Number: [id number] Date of Birth/Sex: [birth year] (50 y.o. Male) Treating RN: Huel Coventry Primary Care Physician: Angus Palms Other Clinician: Referring Physician: Angus Palms Treating Physician/Extender: Rudene Re in Treatment: 95 Information Obtained from: Patient Chief Complaint Patients presents for treatment of an open diabetic ulcer and pressure injury to his left lower thigh posteriorly for about [name] year. he is back after her recent consultation at [facility] outpatient prosthetic center Electronic Signature(s) Signed: 07/17/2016 9:09:55 AM By: Evlyn Kanner MD, FACS Entered By: Evlyn Kanner on 07/17/2016 09:09:55 [name], Richard AMarland Kitchen ([id number]) -------------------------------------------------------------------------------- HPI Details Patient Name: Richard [name] [name]. Date of Service: 07/17/2016 8:00 AM Medical Record Number: [id number] Patient Account Number: [id number] Date of Birth/Sex: 1966/10/15 (50 y.o. Male) Treating RN: Huel Coventry Primary Care Physician: Angus Palms Other Clinician: Referring Physician: Angus Palms Treating Physician/Extender: Rudene Re in Treatment: 32 History of Present Illness Location: left lower thigh posteriorly Quality: Patient reports experiencing [name] dull pain to affected area(s). Severity: Patient states wound are getting worse. Duration: Patient has had the wound for > 11 months prior to seeking treatment at the wound center Timing: Pain in wound is Intermittent (comes and goes Context: The wound occurred when the patient has been wearing [name] prosthesis on his left lower extremity for [name] left BKA Modifying Factors: Other treatment(s) tried include:working with the prosthetic and orthopedic  department at [facility] Associated Signs and Symptoms: Patient reports having foul odor. HPI Description: 50 year old gentleman with past medical history of diabetes mellitus with last hemoglobin A1c done in February 2016 was 8.7, hypertension, hypothyroidism, coronary artery disease, ischemic cardiomyopathy , nephropathy, obesity, peripheral neuropathy, peripheral vascular disease and sleep apnea. 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 He has had problems with his left BKA prosthesis for [name] long while and had [name] vacuum system and then had to be changed over to [name] sleeve system and has been working with the orthopedic and prosthetic department at [facility]. He has also had [name] plastic surgery opinion in the past. The ulceration on the posterior part of his lower thigh continued to be aggravating an enlarging and have [name] foul order and drainage. 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 Lubertha Basque the PA whonoted that he continue to wear his prosthesis against medical advice. details from his past surgical history notes that he's had [name] cardiac catheterization, coronary angioplasty, amputation of the left leg in 2012, debridement of skin and subcutis tissue in June 2013, application of wound VAC at that time, below knee amputation revision in April 2014. Recommendation was for [name] new socket to be made which should be above the new wound. He also recommended surgical revision of the redundant skin and to stay off the prosthesis to the wound heals but the patient would not be able to do this because of work. Hence he was advised to establish with [name] local wound clinic for  management of his current wound and follow-up with the amputee clinic at [facility] or  with Dr. Mindi Curling. If he decided to go back to them for surgery he should see Dr. Lanier Ensign. 03/27/2016 -- he was unable to get his prosthesis made at the place where they had begun to mold and hence he has to find himself [name] mother prosthetic department and is working on this. 04/10/2016 -- on his left popliteal fossa area he has got [name] small satellite abrasion with an ulceration which was caused by pulling off the socket on his skin. He still has to see his prosthetic department. Richard, [name] ([id number]) 05/01/2016 -- today he comes with [name] new problem on his right foot which he says he's had for about 2 months and has been treated by his podiatrist Dr. Ovid Curd. Says due to the fact that his footwear and insoles were worn out, he has developed an ulcerated area which continues to drain sero- sanguinous fluid and has been on doxycycline for about 2 months. He saw Dr. Ardelle Anton who did an x-ray of the foot and has been following up with him for [name] long while regarding this and managing his Charcot's arthropathy right foot. Today the patient wanted me to take [name] look at it and agrees to undergo treatment for this. 05/08/2016 -- I have reviewed notes from Dr. Vaughan Sine who saw him on 05/06/2016. after review he asked him to continue with local wound care and started him on ciprofloxacin as well as his doxycycline. note is made of the fact that on his previous visit on 02/01/2016 x-rays were done which showed chronic arthritic changes present in the rear foot due to Charcot deformity. Vessel occification is present. 06/12/2016 -- the patient has not yet got his left below-knee prostheses sleeve which was to be made afresh. He has also not received his right foot orthotic shoe which was to be made at Dr. Kenna Gilbert office. It has been over 2 months since he keeps telling me that he is going to get this done  soon. 07/17/2016 -- shunt has not been to see as in 5 weeks and continues to have issues trying to get his prosthesis for his left below-knee amputation. He has got new superficial abrasion on the medial part of his left knee and this is separate from the large ulceration in the popliteal fossa. Did get his inserts for his diabetic shoe for his right foot and this wound is healed. Electronic Signature(s) Signed: 07/17/2016 9:10:57 AM By: Evlyn Kanner MD, FACS Entered By: Evlyn Kanner on 07/17/2016 09:10:57 [name], Richard Som ([id number]) -------------------------------------------------------------------------------- Physical Exam Details Patient Name: Richard [name] [name]. Date of Service: 07/17/2016 8:00 AM Medical Record Number: [id number] Patient Account Number: [id number] Date of Birth/Sex: [birth year] (50 y.o. Male) Treating RN: Huel Coventry Primary Care Physician: Angus Palms Other Clinician: Referring Physician: Angus Palms Treating Physician/Extender: Rudene Re in Treatment: 19 Constitutional . Pulse regular. Respirations normal and unlabored. Afebrile. . Eyes Nonicteric. Reactive to light. Ears, Nose, Mouth, and Throat Lips, teeth, and gums WNL.Marland Kitchen Moist mucosa without lesions. Neck supple and nontender. No palpable supraclavicular or cervical adenopathy. Normal sized without goiter. Respiratory WNL. No retractions.. Cardiovascular Pedal Pulses WNL. No clubbing, cyanosis or edema. Lymphatic No adneopathy. No adenopathy. No adenopathy. Musculoskeletal Adexa without tenderness or enlargement.. Digits and nails w/o clubbing, cyanosis, infection, petechiae, ischemia, or inflammatory conditions.. Integumentary (Hair, Skin) No suspicious lesions. No crepitus or fluctuance. No peri-wound warmth or erythema. No masses.Marland Kitchen Psychiatric Judgement and insight Intact.. No evidence of depression, anxiety, or agitation.. Notes the wound on the left  popliteal fossa continues  to have healthy granulation tissue and there is no evidence of cellulitis around it. He has [name] new superficial stage I pressure injury to the left medial knee due to his prostheses. The right foot plantar ulceration has completely healed and I have not recommended any dressing for this. Electronic Signature(s) Signed: 07/17/2016 9:12:03 AM By: Evlyn Kanner MD, FACS Entered By: Evlyn Kanner on 07/17/2016 09:12:03 Buchler, Richard Som ([id number]) -------------------------------------------------------------------------------- Physician Orders Details Patient Name: Richard [name] [name]. Date of Service: 07/17/2016 8:00 AM Medical Record Number: [id number] Patient Account Number: [id number] Date of Birth/Sex: Apr 29, 1966 (50 y.o. Male) Treating RN: Clover Mealy, RN, BSN, Haring Sink Primary Care Physician: Angus Palms Other Clinician: Referring Physician: Angus Palms Treating Physician/Extender: Rudene Re in Treatment: 86 Verbal / Phone Orders: Yes Clinician: Afful, RN, BSN, Rita Read Back and Verified: Yes Diagnosis Coding Wound Cleansing Wound #3 Left,Posterior Amputation Site - Below Knee o Cleanse wound with mild soap and water o May Shower, gently pat wound dry prior to applying new dressing. o May shower with protection. Anesthetic Wound #3 Left,Posterior Amputation Site - Below Knee o Topical Lidocaine 4% cream applied to wound bed prior to debridement Primary Wound Dressing Wound #3 Left,Posterior Amputation Site - Below Knee o Aquacel Ag Secondary Dressing Wound #3 Left,Posterior Amputation Site - Below Knee o Gauze and Kerlix/Conform Dressing Change Frequency Wound #3 Left,Posterior Amputation Site - Below Knee o Change dressing every day. Follow-up Appointments Wound #3 Left,Posterior Amputation Site - Below Knee o Return Appointment in 1 week. Additional Orders / Instructions Wound #3 Left,Posterior Amputation Site - Below Knee o Increase protein  intake. o OK to return to work o OK to return to work with the following restrictions: o Activity as tolerated o Other: - Vitamin [name], C, [employer], MVI [name], Richard [name]. ([id number]) Electronic Signature(s) Signed: 07/17/2016 4:38:34 PM By: Evlyn Kanner MD, FACS Signed: 07/17/2016 5:32:33 PM By: Elpidio Eric BSN, RN Entered By: Elpidio Eric on 07/17/2016 09:09:57 [name], Richard AMarland Kitchen ([id number]) -------------------------------------------------------------------------------- Problem List Details Patient Name: Richard [name] [name]. Date of Service: 07/17/2016 8:00 AM Medical Record Number: [id number] Patient Account Number: [id number] Date of Birth/Sex: [birth year] (50 y.o. 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 ([id number]) -------------------------------------------------------------------------------- Progress Note Details Patient Name: Richard [name] [name]. Date of Service: 07/17/2016 8:00 AM Medical Record Number: [id number] Patient Account Number: [id number] Date of Birth/Sex: 08/15/1966 (50 y.o.  Male) Treating RN: Huel Coventry Primary Care Physician: Angus Palms Other Clinician: Referring Physician: Angus Palms Treating Physician/Extender: Rudene Re in Treatment: 35 Subjective Chief Complaint Information obtained from Patient Patients presents for treatment of an open diabetic ulcer and pressure injury to his left lower thigh posteriorly for about [name] year. he is back after her recent consultation at [facility] outpatient prosthetic center  History of Present Illness (HPI) The following HPI elements were documented for the patient's wound: Location: left lower thigh posteriorly Quality: Patient reports experiencing [name] dull pain to affected area(s). Severity: Patient states wound are getting worse. Duration: Patient has had the wound for > 11 months prior to seeking treatment at the wound center Timing: Pain in wound is Intermittent (comes and goes Context: The wound occurred when the patient has been wearing [name] prosthesis on his left lower extremity for [name] left BKA Modifying Factors: Other treatment(s) tried include:working with the prosthetic and orthopedic department at [name] Associated Signs and Symptoms: Patient reports having foul odor. 50 year old gentleman with past medical history of diabetes mellitus with last hemoglobin A1c done in February 2016 was 8.7, hypertension, hypothyroidism, coronary artery disease, ischemic cardiomyopathy , nephropathy, obesity, peripheral neuropathy, peripheral vascular disease and sleep apnea. 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 He has had problems with his left BKA prosthesis for [name] long while and had [name] vacuum system and then had to be changed over to [name] sleeve system and [facility] He has also had [name]  plastic surgery opinion in the past. The ulceration on the posterior part of his lower thigh continued to be aggravating an enlarging and have [name] foul order and drainage. 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 Lubertha BasqueJennifer Dye the PA whonoted that he continue to wear his prosthesis against medical advice. details from his past surgical history notes that he's had [name] cardiac catheterization, coronary angioplasty, amputation of the left leg in 2012, debridement of skin and subcutis tissue in June 2013, application of wound VAC at that time, below knee amputation revision in April 2014. Sindelar, Marcell [name]. ([id number]) Recommendation was for [name] new socket to be made which should be above the new wound. He also recommended surgical revision of the redundant skin and to stay off the prosthesis to the wound heals but the patient would not be able to do this because of work. Hence he was advised to establish with [name] local wound clinic for management of his current wound and [facility] or with Dr. Mindi Curlingawney. If he decided to go back to them for surgery he should see Dr. Lanier Ensigneilley. 03/27/2016 -- he was unable to get his prosthesis made at the place where they had begun to mold and hence he has to find himself [name] mother prosthetic department and is working on this. 04/10/2016 -- on his left popliteal fossa area he has got [name] small satellite abrasion with an ulceration which was caused by pulling off the socket on his skin. He still has to see his prosthetic department. 05/01/2016 -- today he comes with [name] new problem on his right foot which he says he's had for about 2 months and has been treated by his podiatrist Dr. Ovid CurdMatthew Wagoner. Says due to the fact that his footwear and insoles were worn out, he has developed  an ulcerated area which continues to drain sero- sanguinous fluid and has been on doxycycline for about 2 months. He saw Dr. Ardelle AntonWagoner who did  an x-ray of the foot and has been following up with him for [name] long while regarding this and managing his Charcot's arthropathy right foot. Today the patient wanted me to take [name] look at it and agrees to undergo treatment for this. 05/08/2016 -- I have reviewed notes from Dr. Vaughan Sine who saw him on 05/06/2016. after review he asked him to continue with local wound care and started him on ciprofloxacin as well as his doxycycline. note is made of the fact that on his previous visit on 02/01/2016 x-rays were done which showed chronic arthritic changes present in the rear foot due to Charcot deformity. Vessel occification is present. 06/12/2016 -- the patient has not yet got his left below-knee prostheses sleeve which was to be made afresh. He has also not received his right foot orthotic shoe which was to be made at Dr. Kenna Gilbert office. It has been over 2 months since he keeps telling me that he is going to get this done soon. 07/17/2016 -- shunt has not been to see as in 5 weeks and continues to have issues trying to get his prosthesis for his left below-knee amputation. He has got new superficial abrasion on the medial part of his left knee and this is separate from the large ulceration in the popliteal fossa. Did get his inserts for his diabetic shoe for his right foot and this wound is healed. Objective Constitutional Pulse regular. Respirations normal and unlabored. Afebrile. Vitals Time Taken: 8:19 AM, Height: 66 in, Weight: 280 lbs, BMI: 45.2, Temperature: 97.9 F, Pulse: 78 bpm, Respiratory Rate: 18 breaths/min, Blood Pressure: 119/72 mmHg. Eyes Nonicteric. Reactive to light. Ears, Nose, Mouth, and Throat Lips, teeth, and gums WNL.Marland Kitchen Moist mucosa without lesions. Neck [name], Richard [name]. ([id number]) supple and nontender. No palpable  supraclavicular or cervical adenopathy. Normal sized without goiter. Respiratory WNL. No retractions.. Cardiovascular Pedal Pulses WNL. No clubbing, cyanosis or edema. Lymphatic No adneopathy. No adenopathy. No adenopathy. Musculoskeletal Adexa without tenderness or enlargement.. Digits and nails w/o clubbing, cyanosis, infection, petechiae, ischemia, or inflammatory conditions.Marland Kitchen Psychiatric Judgement and insight Intact.. No evidence of depression, anxiety, or agitation.. General Notes: the wound on the left popliteal fossa continues to have healthy granulation tissue and there is no evidence of cellulitis around it. He has [name] new superficial stage I pressure injury to the left medial knee due to his prostheses. The right foot plantar ulceration has completely healed and I have not recommended any dressing for this. Integumentary (Hair, Skin) No suspicious lesions. No crepitus or fluctuance. No peri-wound warmth or erythema. No masses.. Wound #3 status is Open. Original cause of wound was Pressure Injury. The wound is located on the Left,Posterior Amputation Site - Below Knee. The wound measures 3cm length x 9cm width x 0.2cm depth; 21.206cm^2 area and 4.241cm^3 volume. The wound is limited to skin breakdown. There is [name] large amount of purulent drainage noted. The wound margin is thickened. There is large (67-100%) pink granulation within the wound bed. There is [name] small (1-33%) amount of necrotic tissue within the wound bed including Adherent Slough. The periwound skin appearance exhibited: Scarring, Maceration, Moist. The periwound skin appearance did not exhibit: Callus, Crepitus, Excoriation, Fluctuance, Friable, Induration, Localized Edema, Rash, Dry/Scaly, Atrophie Blanche, Cyanosis, Ecchymosis, Hemosiderin Staining, Mottled, Pallor, Rubor, Erythema. Periwound temperature was noted as No Abnormality. The periwound has tenderness on palpation. Wound #4 status is Healed -  Epithelialized. Original cause of wound was Gradually Appeared. The wound is located on the Right,Medial,Plantar Foot. The  wound measures 0cm length x 0cm width x 0cm depth; 0cm^2 area and 0cm^3 volume. The wound is limited to skin breakdown. There is no tunneling or undermining noted. There is [name] medium amount of serosanguineous drainage noted. The wound margin is thickened. There is large (67-100%) red granulation within the wound bed. There is no necrotic tissue within the wound bed. The periwound skin appearance exhibited: Callus, Dry/Scaly. The periwound skin appearance did not exhibit: Crepitus, Excoriation, Fluctuance, Friable, Induration, Localized Edema, Rash, Scarring, Maceration, Moist, Atrophie Blanche, Cyanosis, Ecchymosis, Hemosiderin Staining, Mottled, Pallor, Rubor, Erythema. Periwound temperature was noted as No Abnormality. Wound #5 status is Open. Original cause of wound was Blister. The wound is located on the Left,Medial Amputation Site - Below Knee. The wound measures 2cm length x 2.8cm width x 0.1cm depth; 4.398cm^2 area and 0.44cm^3 volume. The wound is limited to skin breakdown. There is no tunneling or undermining [name], Richard [name]. ([id number]) noted. There is [name] small amount of serous drainage noted. The wound margin is flat and intact. There is small (1-33%) red granulation within the wound bed. There is [name] small (1-33%) amount of necrotic tissue within the wound bed including Eschar. The periwound skin appearance did not exhibit: Callus, Crepitus, Excoriation, Fluctuance, Friable, Induration, Localized Edema, Rash, Scarring, Dry/Scaly, Maceration, Moist, Atrophie Blanche, Cyanosis, Ecchymosis, Hemosiderin Staining, Mottled, Pallor, Rubor, Erythema. Assessment Active Problems ICD-10 E11.622 - Type 2 diabetes mellitus with other skin ulcer L97.122 - Non-pressure chronic ulcer of left thigh with fat layer exposed M70.852 - Other soft tissue disorders related to use,  overuse and pressure, left thigh E66.01 - Morbid (severe) obesity due to excess calories L97.412 - Non-pressure chronic ulcer of right heel and midfoot with fat layer exposed M14.671 - Charcot's joint, right ankle and foot The patient has been seen here after 5 weeks and he is frustrated because his prosthesis has not been fixed but at the same time he continues to use his prostheses and he does understand that there is uncontrolled pressure on the area where he has an ulceration. For the left posterior thigh wound we will use silver alginate and an appropriate bandage over this to help him place his socket for his prostheses. I have recommended [name] bordered foam to the superficial abrasion on the medial part of his left knee Control of his diabetes mellitus and nutrition has been emphasized. also urged him to work with his orthotic and prosthetic group to try and get his new sleeve and new orthotic shoe. He is urged to come to see as on [name] regular basis Plan Wound Cleansing: Wound #3 Left,Posterior Amputation Site - Below Knee: Cleanse wound with mild soap and water May Shower, gently pat wound dry prior to applying new dressing. May shower with protection. GURJOT, BRISCO ([id number]) Anesthetic: Wound #3 Left,Posterior Amputation Site - Below Knee: Topical Lidocaine 4% cream applied to wound bed prior to debridement Primary Wound Dressing: Wound #3 Left,Posterior Amputation Site - Below Knee: Aquacel Ag Secondary Dressing: Wound #3 Left,Posterior Amputation Site - Below Knee: Gauze and Kerlix/Conform Dressing Change Frequency: Wound #3 Left,Posterior Amputation Site - Below Knee: Change dressing every day. Follow-up Appointments: Wound #3 Left,Posterior Amputation Site - Below Knee: Return Appointment in 1 week. Additional Orders / Instructions: Wound #3 Left,Posterior Amputation Site - Below Knee: Increase protein intake. 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 For the left posterior thigh wound we will use silver alginate and an appropriate bandage over this to help him place his socket for his prostheses. I have recommended [name] bordered foam to the superficial abrasion on the medial part of his left knee Control of his diabetes mellitus and nutrition has been emphasized. also urged him to work with his orthotic and prosthetic group to try and get his new sleeve and new orthotic shoe. He is urged to come to see as on [name] regular basis Electronic Signature(s) Signed: 07/17/2016 9:14:03 AM By: Evlyn Kanner MD, FACS Entered By: Evlyn Kanner on 07/17/2016 09:14:03 Krock, Dmitriy [name]. ([id number]) Grahn, Chadwin AMarland Kitchen ([id number]) -------------------------------------------------------------------------------- SuperBill Details Patient Name: Richard [name] [name]. Date of Service: 07/17/2016 Medical Record Number: [id number] Patient Account Number: [id number] Date of Birth/Sex: [birth year] (50 y.o. 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 M14.671 Charcot's joint, right ankle and  foot Facility Procedures CPT4 Code: [id number] Description: 99214 - WOUND CARE VISIT-LEV 4 EST PT Modifier: Quantity: 1 Physician Procedures CPT4 Code Description: [id number] 99213 - WC PHYS LEVEL 3 - EST PT ICD-10 Description Diagnosis E11.622 Type 2 diabetes mellitus with other skin ulcer L97.122 Non-pressure chronic ulcer of left thigh with fat l M70.852 Other soft tissue disorders related to  use, overuse E66.01 Morbid (severe) obesity due to excess calories Modifier: ayer exposed and pressure, Quantity: 1 left thigh Electronic Signature(s) Signed: 07/17/2016 4:38:55 PM By: Elpidio Eric BSN, RN Previous Signature: 07/17/2016 9:14:29 AM Version By: Evlyn Kanner MD, FACS Entered By: Elpidio Eric on 07/17/2016 16:38:55

## 2016-07-18 DIAGNOSIS — L97919 Non-pressure chronic ulcer of unspecified part of right lower leg with unspecified severity: Secondary | ICD-10-CM | POA: Diagnosis not present

## 2016-07-18 NOTE — Progress Notes (Signed)
[name], [name] ([id number]) Visit Report for 07/17/2016 Arrival Information Details Patient Name: [name], [name] Date of Service: 07/17/2016 8:00 AM Medical Record Number: [id number] Patient Account Number: [id number] Date of Birth/Sex: [birth year] (50 y.o. Male) Treating RN: Huel Coventry Primary Care Physician: Angus Palms Other Clinician: Referring Physician: Angus Palms Treating Physician/Extender: Rudene Re in Treatment: 23 Visit Information History Since Last Visit Added or deleted any medications: No Patient Arrived: Ambulatory Any new allergies or adverse reactions: No Arrival Time: 08:18 Had a fall or experienced change in No Accompanied By: self activities of daily living that may affect Transfer Assistance: None risk of falls: Patient Identification Verified: Yes Signs or symptoms of abuse/neglect since last No Secondary Verification Process Yes visito Completed: Hospitalized since last visit: No Patient Requires Transmission-Based No Has Dressing in Place as Prescribed: Yes Precautions: Pain Present Now: No Patient Has Alerts: No Electronic Signature(s) Signed: 07/17/2016 4:46:05 PM By: Elliot Gurney, RN, BSN, Kim RN, BSN Entered By: Elliot Gurney, RN, BSN, Kim on 07/17/2016 08:42:03 [name], [name] ([id number]) -------------------------------------------------------------------------------- Clinic Level of Care Assessment Details Patient Name: Richard Kyle A. Date of Service: 07/17/2016 8:00 AM Medical Record Number: [id number] Patient Account Number: [id number] Date of Birth/Sex: 04-24-1966 (50 y.o. Male) Treating RN: Clover Mealy, RN, BSN, Rita Primary Care Physician: Angus Palms Other Clinician: Referring Physician: Angus Palms Treating Physician/Extender: Rudene Re in Treatment: 19 Clinic Level of Care Assessment Items TOOL 4 Quantity Score []  - Use when only an EandM is performed on FOLLOW-UP visit 0 ASSESSMENTS - Nursing Assessment  / Reassessment X - Reassessment of Co-morbidities (includes updates in patient status) 1 10 X - Reassessment of Adherence to Treatment Plan 1 5 ASSESSMENTS - Wound and Skin Assessment / Reassessment []  - Simple Wound Assessment / Reassessment - one wound 0 X - Complex Wound Assessment / Reassessment - multiple wounds 3 5 []  - Dermatologic / Skin Assessment (not related to wound area) 0 ASSESSMENTS - Focused Assessment []  - Circumferential Edema Measurements - multi extremities 0 []  - Nutritional Assessment / Counseling / Intervention 0 []  - Lower Extremity Assessment (monofilament, tuning fork, pulses) 0 []  - Peripheral Arterial Disease Assessment (using hand held doppler) 0 ASSESSMENTS - Ostomy and/or Continence Assessment and Care []  - Incontinence Assessment and Management 0 []  - Ostomy Care Assessment and Management (repouching, etc.) 0 PROCESS - Coordination of Care X - Simple Patient / Family Education for ongoing care 1 15 []  - Complex (extensive) Patient / Family Education for ongoing care 0 X - Staff obtains Chiropractor, Records, Test Results / Process Orders 1 10 []  - Staff telephones HHA, Nursing Homes / Clarify orders / etc 0 []  - Routine Transfer to another Facility (non-emergent condition) 0 [name], [name] ([id number]) []  - Routine Hospital Admission (non-emergent condition) 0 []  - New Admissions / Manufacturing engineer / Ordering NPWT, Apligraf, etc. 0 []  - Emergency Hospital Admission (emergent condition) 0 []  - Simple Discharge Coordination 0 []  - Complex (extensive) Discharge Coordination 0 PROCESS - Special Needs []  - Pediatric / Minor Patient Management 0 []  - Isolation Patient Management 0 []  - Hearing / Language / Visual special needs 0 []  - Assessment of Community assistance (transportation, D/C planning, etc.) 0 []  - Additional assistance / Altered mentation 0 []  - Support Surface(s) Assessment (bed, cushion, seat, etc.) 0 INTERVENTIONS - Wound Cleansing  / Measurement []  - Simple Wound Cleansing - one wound 0 X - Complex Wound Cleansing - multiple wounds 3 5 X - Wound Imaging (photographs - any  number of wounds) 1 5 []  - Wound Tracing (instead of photographs) 0 []  - Simple Wound Measurement - one wound 0 X - Complex Wound Measurement - multiple wounds 3 5 INTERVENTIONS - Wound Dressings X - Small Wound Dressing one or multiple wounds 3 10 []  - Medium Wound Dressing one or multiple wounds 0 []  - Large Wound Dressing one or multiple wounds 0 []  - Application of Medications - topical 0 []  - Application of Medications - injection 0 INTERVENTIONS - Miscellaneous []  - External ear exam 0 [name], [name] ([id number]) []  - Specimen Collection (cultures, biopsies, blood, body fluids, etc.) 0 []  - Specimen(s) / Culture(s) sent or taken to Lab for analysis 0 []  - Patient Transfer (multiple staff / Michiel Sites Lift / Similar devices) 0 []  - Simple Staple / Suture removal (25 or less) 0 []  - Complex Staple / Suture removal (26 or more) 0 []  - Hypo / Hyperglycemic Management (close monitor of Blood Glucose) 0 []  - Ankle / Brachial Index (ABI) - do not check if billed separately 0 X - Vital Signs 1 5 Has the patient been seen at the hospital within the last three years: Yes Total Score: 125 Level Of Care: New/Established - Level 4 Electronic Signature(s) Signed: 07/17/2016 5:32:33 PM By: Elpidio Eric BSN, RN Entered By: Elpidio Eric on 07/17/2016 16:38:35 [name], Richard AMarland Kitchen ([id number]) -------------------------------------------------------------------------------- Encounter Discharge Information Details Patient Name: Richard Kyle A. Date of Service: 07/17/2016 8:00 AM Medical Record Number: [id number] Patient Account Number: [id number] Date of Birth/Sex: 05-10-1966 (50 y.o. Male) Treating RN: Huel Coventry Primary Care Physician: Angus Palms Other Clinician: Referring Physician: Angus Palms Treating Physician/Extender: Rudene Re  in Treatment: 2 Encounter Discharge Information Items Discharge Pain Level: 0 Discharge Condition: Stable Ambulatory Status: Ambulatory Discharge Destination: Home Transportation: Private Auto Accompanied By: self Schedule Follow-up Appointment: Yes Medication Reconciliation completed and provided to Patient/Care Yes Tryson Lumley: Provided on Clinical Summary of Care: 07/17/2016 Form Type Recipient Paper Patient DP Electronic Signature(s) Signed: 07/17/2016 9:10:24 AM By: Gwenlyn Perking Entered By: Gwenlyn Perking on 07/17/2016 09:10:24 Mcbryar, Lamberto A. ([id number]) -------------------------------------------------------------------------------- Lower Extremity Assessment Details Patient Name: Richard Kyle A. Date of Service: 07/17/2016 8:00 AM Medical Record Number: [id number] Patient Account Number: [id number] Date of Birth/Sex: [birth year] (50 y.o. Male) Treating RN: Huel Coventry Primary Care Physician: Angus Palms Other Clinician: Referring Physician: Angus Palms Treating Physician/Extender: Rudene Re in Treatment: 31 Vascular Assessment Pulses: Posterior Tibial Dorsalis Pedis Palpable: [Right:Yes] Extremity colors, hair growth, and conditions: Extremity Color: [Right:Dusky] Hair Growth on Extremity: [Right:No] Temperature of Extremity: [Right:Warm] Capillary Refill: [Right:> 3 seconds] Toe Nail Assessment Left: Right: Thick: No Discolored: Yes Deformed: No Improper Length and Hygiene: No Electronic Signature(s) Signed: 07/17/2016 4:46:05 PM By: Elliot Gurney, RN, BSN, Kim RN, BSN Entered By: Elliot Gurney, RN, BSN, Kim on 07/17/2016 08:28:12 [name], Richard AMarland Kitchen ([id number]) -------------------------------------------------------------------------------- Multi Wound Chart Details Patient Name: Richard Kyle A. Date of Service: 07/17/2016 8:00 AM Medical Record Number: [id number] Patient Account Number: [id number] Date of Birth/Sex: 1966-05-13 (50 y.o.  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 the Lower Extremity Comorbid History: Chronic sinus Chronic sinus Chronic sinus problems/congestion, problems/congestion, problems/congestion, Anemia, Asthma, Sleep Anemia, Asthma, Sleep Anemia, Asthma, Sleep Apnea, Coronary Artery Apnea, Coronary Artery Apnea, Coronary Artery Disease, Hypertension, Disease, Hypertension, Disease, Hypertension, Myocardial Infarction, Myocardial Infarction, Myocardial Infarction, Type II Diabetes, History Type II Diabetes, History Type II Diabetes, History of pressure wounds, Gout, of pressure wounds, Gout, of pressure wounds, Gout, Neuropathy Neuropathy Neuropathy Date Acquired: 06/26/2015 03/28/2016 07/16/2016 Weeks of Treatment: 19 11 0 Wound Status: Open Open Open Measurements L x W x D 3x9x0.2 0.1x0.1x0.1 2x2.8x0.1 (cm) Area (cm) : 21.206 0.008 4.398 Volume (cm) : 4.241 0.001 0.44 % Reduction in Area: 25.00% 99.30% N/A % Reduction in Volume: 25.00% 99.20% N/A [name], [name] ([id number]) Classification: Grade 2 Grade 1 Grade 1 Exudate Amount: Large Medium Small Exudate Type: Purulent Serosanguineous Serous Exudate Color: yellow, brown, green red, brown amber Foul Odor After Yes No N/A Cleansing: Odor Anticipated Due to No N/A N/A Product  Use: Wound Margin: Thickened Thickened Flat and Intact Granulation Amount: Large (67-100%) Large (67-100%) Small (1-33%) Granulation Quality: Pink Red Red Necrotic Amount: Small (1-33%) None Present (0%) Small (1-33%) Necrotic Tissue: Adherent Slough N/A Eschar Exposed Structures: Fascia: No Fascia: No Fascia: No Fat: No Fat: No Fat: No Tendon: No Tendon: No Tendon: No Muscle: No Muscle: No Muscle: No Joint: No Joint: No Joint: No Bone: No Bone: No Bone: No Limited to Skin Limited to Skin Limited to Skin Breakdown Breakdown Breakdown Epithelialization: None None Small (1-33%) Periwound Skin Texture: Scarring: Yes Callus: Yes Edema: No Edema: No Edema: No Excoriation: No Excoriation: No Excoriation: No Induration: No Induration: No Induration: No Callus: No Callus: No Crepitus: No Crepitus: No Crepitus: No Fluctuance: No Fluctuance: No Fluctuance: No Friable: No Friable: No Friable: No Rash: No Rash: No Rash: No Scarring: No Scarring: No Periwound Skin Maceration: Yes Dry/Scaly: Yes Maceration: No Moisture: Moist: Yes Maceration: No Moist: No Dry/Scaly: No Moist: No Dry/Scaly: No Periwound Skin Color: Atrophie Blanche: No Atrophie Blanche: No Atrophie Blanche: No Cyanosis: No Cyanosis: No Cyanosis: No Ecchymosis: No Ecchymosis: No Ecchymosis: No Erythema: No Erythema: No Erythema: No Hemosiderin Staining: No Hemosiderin Staining: No Hemosiderin Staining: No Mottled: No Mottled: No Mottled: No Pallor: No Pallor: No Pallor: No Rubor: No Rubor: No Rubor: No Temperature: No Abnormality No Abnormality N/A Tenderness on Yes No No Palpation: Wound Preparation: Ulcer Cleansing: Ulcer Cleansing: Ulcer Cleansing: Rinsed/Irrigated with Rinsed/Irrigated with Rinsed/Irrigated with Saline Saline Saline Topical Anesthetic Topical Anesthetic Nickless, Brodie A. ([id number]) Applied: None, Other: Applied: None, Other: Topical  Anesthetic lidocaine 4% Lidocaine 4% Applied: None Treatment Notes Electronic Signature(s) Signed: 07/17/2016 5:32:33 PM By: Elpidio Eric BSN, RN Entered By: Elpidio Eric on 07/17/2016 08:53:17 [name], Richard AMarland Kitchen ([id number]) -------------------------------------------------------------------------------- Multi-Disciplinary Care Plan Details Patient Name: Richard Kyle A. Date of Service: 07/17/2016 8:00 AM Medical Record Number: [id number] Patient Account Number: [id number] Date of Birth/Sex: 07/22/1966 (50 y.o. Male) Treating RN: Clover Mealy, RN, BSN, Nile Sink Primary Care Physician: Angus Palms Other Clinician: Referring Physician: Angus Palms Treating Physician/Extender: Rudene Re in Treatment: 38 Active Inactive Abuse / Safety / Falls / Self Care Management Nursing Diagnoses: Knowledge deficit related to: safety; personal, health (wound), emergency Potential for falls Self care deficit: actual or potential Goals: Patient will remain injury free Date Initiated: 03/06/2016 Goal Status: Active Patient/caregiver will verbalize understanding of skin care regimen Date Initiated: 03/06/2016 Goal Status: Active Patient/caregiver will verbalize/demonstrate measure  taken to improve self care Date Initiated: 03/06/2016 Goal Status: Active Patient/caregiver will verbalize/demonstrate measures taken to improve the patient's personal safety Date Initiated: 03/06/2016 Goal Status: Active Patient/caregiver will verbalize/demonstrate measures taken to prevent injury and/or falls Date Initiated: 03/06/2016 Goal Status: Active Patient/caregiver will verbalize/demonstrate understanding of what to do in case of emergency Date Initiated: 03/06/2016 Goal Status: Active Interventions: Assess fall risk on admission and as needed Assess: immobility, friction, shearing, incontinence upon admission and as needed Assess impairment of mobility on admission and as needed per policy Assess self  care needs on admission and as needed Patient referred to community resources (specify in notes) Provide education on basic hygiene Provide education on fall prevention [name], [name] ([id number]) Provide education on personal and home safety Provide education on safe transfers Treatment Activities: Education provided on Basic Hygiene : 06/12/2016 Notes: Orientation to the Wound Care Program Nursing Diagnoses: Knowledge deficit related to the wound healing center program Goals: Patient/caregiver will verbalize understanding of the Wound Healing Center Program Date Initiated: 03/06/2016 Goal Status: Active Interventions: Provide education on orientation to the wound center Notes: Pressure Nursing Diagnoses: Knowledge deficit related to causes and risk factors for pressure ulcer development Knowledge deficit related to management of pressures ulcers Potential for impaired tissue integrity related to pressure, friction, moisture, and shear Goals: Patient will remain free from development of additional pressure ulcers Date Initiated: 03/06/2016 Goal Status: Active Patient will remain free of pressure ulcers Date Initiated: 03/06/2016 Goal Status: Active Patient/caregiver will verbalize risk factors for pressure ulcer development Date Initiated: 03/06/2016 Goal Status: Active Patient/caregiver will verbalize understanding of pressure ulcer management Date Initiated: 03/06/2016 Goal Status: Active Interventions: Assess: immobility, friction, shearing, incontinence upon admission and as needed Assess offloading mechanisms upon admission and as needed [name], [name] ([id number]) Assess potential for pressure ulcer upon admission and as needed Provide education on pressure ulcers Notes: Wound/Skin Impairment Nursing Diagnoses: Impaired tissue integrity Knowledge deficit related to smoking impact on wound healing Knowledge deficit related to ulceration/compromised skin  integrity Goals: Patient/caregiver will verbalize understanding of skin care regimen Date Initiated: 03/06/2016 Goal Status: Active Ulcer/skin breakdown will have a volume reduction of 30% by week 4 Date Initiated: 03/06/2016 Goal Status: Active Ulcer/skin breakdown will have a volume reduction of 50% by week 8 Date Initiated: 03/06/2016 Goal Status: Active Ulcer/skin breakdown will have a volume reduction of 80% by week 12 Date Initiated: 03/06/2016 Goal Status: Active Ulcer/skin breakdown will heal within 14 weeks Date Initiated: 03/06/2016 Goal Status: Active Interventions: Assess patient/caregiver ability to obtain necessary supplies Assess patient/caregiver ability to perform ulcer/skin care regimen upon admission and as needed Assess ulceration(s) every visit Provide education on ulcer and skin care Treatment Activities: Referred to DME Meric Joye for dressing supplies : 03/06/2016 Skin care regimen initiated : 03/06/2016 Topical wound management initiated : 03/06/2016 Notes: Electronic Signature(s) Signed: 07/17/2016 5:32:33 PM By: Elpidio Eric BSN, RN Entered By: Elpidio Eric on 07/17/2016 08:52:52 [name], [name] ([id number]) [name], Richard AMarland Kitchen ([id number]) -------------------------------------------------------------------------------- Pain Assessment Details Patient Name: Richard Kyle A. Date of Service: 07/17/2016 8:00 AM Medical Record Number: [id number] Patient Account Number: [id number] Date of Birth/Sex: February 27, 1966 (50 y.o. Male) Treating RN: Huel Coventry Primary Care Physician: Angus Palms Other Clinician: Referring Physician: Angus Palms Treating Physician/Extender: Rudene Re in Treatment: 51 Active Problems Location of Pain Severity and Description of Pain Patient Has Paino No Site Locations With Dressing Change: No Pain Management and Medication Current Pain Management: Electronic Signature(s) Signed: 07/17/2016 4:46:05 PM By: Elliot Gurney, RN,  BSN, Radio producer, BSN Entered By: Elliot Gurney, RN, BSN, Kim on 07/17/2016 08:19:39 Laverdiere, [name] ([id number]) -------------------------------------------------------------------------------- Patient/Caregiver Education Details Patient Name: Richard Kyle A. Date of Service: 07/17/2016 8:00 AM Medical Record Number: [id number] Patient Account Number: [id number] Date of Birth/Gender: December 01, 1965 (50 y.o. Male) Treating RN: Huel Coventry Primary Care Physician: Angus Palms Other Clinician: Referring Physician: Angus Palms Treating Physician/Extender: Rudene Re in Treatment: 59 Education Assessment Education Provided To: Patient Education Topics Provided Wound/Skin Impairment: Handouts: Caring for Your Ulcer Methods: Demonstration Responses: State content correctly Electronic Signature(s) Signed: 07/17/2016 4:46:05 PM By: Elliot Gurney, RN, BSN, Kim RN, BSN Entered By: Elliot Gurney, RN, BSN, Kim on 07/17/2016 09:04:06 [name], Richard AMarland Kitchen ([id number]) -------------------------------------------------------------------------------- Wound Assessment Details Patient Name: Richard Kyle A. Date of Service: 07/17/2016 8:00 AM Medical Record Number: [id number] Patient Account Number: [id number] Date of Birth/Sex: 11-02-1966 (50 y.o. Male) Treating RN: Huel Coventry Primary Care Physician: Angus Palms Other Clinician: Referring Physician: Angus Palms Treating Physician/Extender: Rudene Re in Treatment: 19 Wound Status Wound Number: 3 Primary Diabetic Wound/Ulcer of the Lower Etiology: Extremity Wound Location: Left Amputation Site - Below Knee - Posterior Wound Open Status: Wounding Event: Pressure Injury Comorbid Chronic sinus problems/congestion, Date Acquired: 06/26/2015 History: Anemia, Asthma, Sleep Apnea, Coronary Weeks Of Treatment: 19 Artery Disease, Hypertension, Myocardial Clustered Wound: No Infarction, Type II Diabetes, History of pressure wounds, Gout,  Neuropathy Photos Wound Measurements Length: (cm) 3 Width: (cm) 9 Depth: (cm) 0.2 Area: (cm) 21.206 Volume: (cm) 4.241 % Reduction in Area: 25% % Reduction in Volume: 25% Epithelialization: None Wound Description Classification: Grade 2 Foul Odor Afte Wound Margin: Thickened Due to Product Exudate Amount: Large Exudate Type: Purulent Exudate Color: yellow, brown, green r Cleansing: Yes Use: No Wound Bed Granulation Amount: Large (67-100%) Exposed Structure Granulation Quality: Pink Fascia Exposed: No Necrotic Amount: Small (1-33%) Fat Layer Exposed: No Whisenant, Coner A. ([id number]) Necrotic Quality: Adherent Slough Tendon Exposed: No Muscle Exposed: No Joint Exposed: No Bone Exposed: No Limited to Skin Breakdown Periwound Skin Texture Texture Color No Abnormalities Noted: No No Abnormalities Noted: No Callus: No Atrophie Blanche: No Crepitus: No Cyanosis: No Excoriation: No Ecchymosis: No Fluctuance: No Erythema: No Friable: No Hemosiderin Staining: No Induration: No Mottled: No Localized Edema: No Pallor: No Rash: No Rubor: No Scarring: Yes Temperature / Pain Moisture Temperature: No Abnormality No Abnormalities Noted: No Tenderness on Palpation: Yes Dry / Scaly: No Maceration: Yes Moist: Yes Wound Preparation Ulcer Cleansing: Rinsed/Irrigated with Saline Topical Anesthetic Applied: None, Other: lidocaine 4%, Treatment Notes Wound #3 (Left, Posterior Amputation Site - Below Knee) 1. Cleansed with: Clean wound with Normal Saline 2. Anesthetic Topical Lidocaine 4% cream to wound bed prior to debridement 4. Dressing Applied: Aquacel Ag 5. Secondary Dressing Applied Kerlix/Conform Notes xsorb Electronic Signature(s) Signed: 07/17/2016 4:46:05 PM By: Elliot Gurney, RN, BSN, Kim RN, BSN Entered By: Elliot Gurney, RN, BSN, Kim on 07/17/2016 08:32:01 [place], [name]  ([id number]) -------------------------------------------------------------------------------- Wound Assessment Details Patient Name: Richard Kyle A. Date of Service: 07/17/2016 8:00 AM Medical Record Number: [id number] Patient Account Number: [id number] Date of Birth/Sex: [birth year] (50 y.o. Male) Treating RN: Clover Mealy, RN, BSN, [place] Sink Primary Care Physician: Angus Palms Other Clinician: Referring Physician: Angus Palms Treating Physician/Extender: Rudene Re in Treatment: 19 Wound Status Wound Number: 4 Primary Diabetic Wound/Ulcer of the Lower Etiology: Extremity Wound Location: Right, Medial, Plantar Foot Wound Healed - Epithelialized Wounding Event: Gradually Appeared Status: Date Acquired: 03/28/2016 Comorbid Chronic sinus problems/congestion, Weeks Of Treatment: 11 History: Anemia, Asthma, Sleep Apnea, Coronary Clustered  Wound: No Artery Disease, Hypertension, Myocardial Infarction, Type II Diabetes, History of pressure wounds, Gout, Neuropathy Photos Wound Measurements Length: (cm) 0 % Reduction in Width: (cm) 0 % Reduction in Depth: (cm) 0 Epithelializat Area: (cm) 0 Tunneling: Volume: (cm) 0 Undermining: Area: 100% Volume: 100% ion: None No No Wound Description Classification: Grade 1 Wound Margin: Thickened Exudate Amount: Medium Exudate Type: Serosanguineous Exudate Color: red, brown Foul Odor After Cleansing: No Wound Bed Granulation Amount: Large (67-100%) Exposed Structure Granulation Quality: Red Fascia Exposed: No Necrotic Amount: None Present (0%) Fat Layer Exposed: No Ozanich, Wilmon A. ([id number]) Tendon Exposed: No Muscle Exposed: No Joint Exposed: No Bone Exposed: No Limited to Skin Breakdown Periwound Skin Texture Texture Color No Abnormalities Noted: No No Abnormalities Noted: No Callus: Yes Atrophie Blanche: No Crepitus: No Cyanosis: No Excoriation: No Ecchymosis: No Fluctuance: No Erythema: No Friable:  No Hemosiderin Staining: No Induration: No Mottled: No Localized Edema: No Pallor: No Rash: No Rubor: No Scarring: No Temperature / Pain Moisture Temperature: No Abnormality No Abnormalities Noted: No Dry / Scaly: Yes Maceration: No Moist: No Wound Preparation Ulcer Cleansing: Rinsed/Irrigated with Saline Topical Anesthetic Applied: None, Other: Lidocaine 4%, Electronic Signature(s) Signed: 07/17/2016 5:32:33 PM By: Elpidio EricAfful, Rita BSN, RN Entered By: Elpidio EricAfful, Rita on 07/17/2016 08:57:33 Herzberg, Maycol A. ([id number]) -------------------------------------------------------------------------------- Wound Assessment Details Patient Name: Richard KylePHILLIPS, Mana A. Date of Service: 07/17/2016 8:00 AM Medical Record Number: [id number] Patient Account Number: [id number] Date of Birth/Sex: 1966-11-02 71(50 y.o. 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 - Medial Wound Open Status: Wounding Event: Blister Comorbid Chronic sinus problems/congestion, Date Acquired: 07/16/2016 History: Anemia, Asthma, Sleep Apnea, Coronary Weeks Of Treatment: 0 Artery Disease, Hypertension, Myocardial Clustered Wound: No Infarction, Type II Diabetes, History of pressure wounds, Gout, Neuropathy Photos Wound Measurements Length: (cm) 2 Width: (cm) 2.8 Depth: (cm) 0.1 Area: (cm) 4.398 Volume: (cm) 0.44 % Reduction in Area: % Reduction in Volume: Epithelialization: Small (1-33%) Tunneling: No Undermining: No Wound Description Classification: Grade 1 Wound Margin: Flat and Intact Exudate Amount: Small Exudate Type: Serous Exudate Color: amber Wound Bed Granulation Amount: Small (1-33%) Exposed  Structure Granulation Quality: Red Fascia Exposed: No Necrotic Amount: Small (1-33%) Fat Layer Exposed: No Senske, Jayvier A. ([id number]) Necrotic Quality: Eschar Tendon Exposed: No Muscle Exposed: No Joint Exposed: No Bone Exposed: No Limited to Skin Breakdown Periwound Skin Texture Texture Color No Abnormalities Noted: No No Abnormalities Noted: No Callus: No Atrophie Blanche: No Crepitus: No Cyanosis: No Excoriation: No Ecchymosis: No Fluctuance: No Erythema: No Friable: No Hemosiderin Staining: No Induration: No Mottled: No Localized Edema: No Pallor: No Rash: No Rubor: No Scarring: No Moisture No Abnormalities Noted: No Dry / Scaly: No Maceration: No Moist: No Wound Preparation Ulcer Cleansing: Rinsed/Irrigated with Saline Topical Anesthetic Applied: None Treatment Notes Wound #5 (Left, Medial Amputation Site - Below Knee) 1. Cleansed with: Clean wound with Normal Saline 2. Anesthetic Topical Lidocaine 4% cream to wound bed prior to debridement 4. Dressing Applied: Aquacel Ag 5. Secondary Dressing Applied Kerlix/Conform Notes [email or web address] Electronic Signature(s) Signed: 07/17/2016 4:46:05 PM By: Elliot GurneyWoody, RN, BSN, Kim RN, BSN Entered By: Elliot GurneyWoody, RN, BSN, Kim on 07/17/2016 08:30:46 [place], [name] ([id number]) -------------------------------------------------------------------------------- Vitals Details Patient Name: Richard KylePHILLIPS, Jahzeel A. Date of Service: 07/17/2016 8:00 AM Medical Record Number: [id number] Patient Account  Number: [id number] Date of Birth/Sex: 30-Jul-1966 (50 y.o. Male) Treating RN: Huel Coventry Primary Care Physician: Angus Palms Other Clinician: Referring Physician: Angus Palms Treating Physician/Extender: Rudene Re in Treatment: 29 Vital Signs Time Taken: 08:19 Temperature (F): 97.9 Height (in): 66 Pulse (bpm): 78 Weight (lbs): 280 Respiratory Rate (breaths/min): 18 Body Mass Index (BMI): 45.2 Blood Pressure  (mmHg): 119/72 Reference Range: 80 - 120 mg / dl Electronic Signature(s) Signed: 07/17/2016 4:46:05 PM By: Elliot Gurney, RN, BSN, Kim RN, BSN Entered By: Elliot Gurney, RN, BSN, Kim on 07/17/2016 08:20:02

## 2016-07-23 DIAGNOSIS — Z89512 Acquired absence of left leg below knee: Secondary | ICD-10-CM | POA: Diagnosis not present

## 2016-07-31 ENCOUNTER — Ambulatory Visit: Payer: PPO | Admitting: Surgery

## 2016-08-01 DIAGNOSIS — L97919 Non-pressure chronic ulcer of unspecified part of right lower leg with unspecified severity: Secondary | ICD-10-CM | POA: Diagnosis not present

## 2016-08-07 ENCOUNTER — Encounter: Payer: PPO | Attending: Surgery | Admitting: Surgery

## 2016-08-07 ENCOUNTER — Ambulatory Visit: Payer: PPO | Admitting: Podiatry

## 2016-08-07 DIAGNOSIS — M14671 Charcot's joint, right ankle and foot: Secondary | ICD-10-CM | POA: Insufficient documentation

## 2016-08-07 DIAGNOSIS — S81002A Unspecified open wound, left knee, initial encounter: Secondary | ICD-10-CM | POA: Insufficient documentation

## 2016-08-07 DIAGNOSIS — E11622 Type 2 diabetes mellitus with other skin ulcer: Secondary | ICD-10-CM | POA: Diagnosis not present

## 2016-08-07 DIAGNOSIS — E039 Hypothyroidism, unspecified: Secondary | ICD-10-CM | POA: Insufficient documentation

## 2016-08-07 DIAGNOSIS — M70852 Other soft tissue disorders related to use, overuse and pressure, left thigh: Secondary | ICD-10-CM | POA: Diagnosis not present

## 2016-08-07 DIAGNOSIS — L97412 Non-pressure chronic ulcer of right heel and midfoot with fat layer exposed: Secondary | ICD-10-CM | POA: Diagnosis not present

## 2016-08-07 DIAGNOSIS — G4730 Sleep apnea, unspecified: Secondary | ICD-10-CM | POA: Insufficient documentation

## 2016-08-07 DIAGNOSIS — E1140 Type 2 diabetes mellitus with diabetic neuropathy, unspecified: Secondary | ICD-10-CM | POA: Insufficient documentation

## 2016-08-07 DIAGNOSIS — E1121 Type 2 diabetes mellitus with diabetic nephropathy: Secondary | ICD-10-CM | POA: Insufficient documentation

## 2016-08-07 DIAGNOSIS — Z6841 Body Mass Index (BMI) 40.0 and over, adult: Secondary | ICD-10-CM | POA: Insufficient documentation

## 2016-08-07 DIAGNOSIS — L97122 Non-pressure chronic ulcer of left thigh with fat layer exposed: Secondary | ICD-10-CM | POA: Insufficient documentation

## 2016-08-07 DIAGNOSIS — I10 Essential (primary) hypertension: Secondary | ICD-10-CM | POA: Diagnosis not present

## 2016-08-07 DIAGNOSIS — L97821 Non-pressure chronic ulcer of other part of left lower leg limited to breakdown of skin: Secondary | ICD-10-CM | POA: Diagnosis not present

## 2016-08-07 DIAGNOSIS — Z89512 Acquired absence of left leg below knee: Secondary | ICD-10-CM | POA: Insufficient documentation

## 2016-08-07 DIAGNOSIS — X58XXXA Exposure to other specified factors, initial encounter: Secondary | ICD-10-CM | POA: Insufficient documentation

## 2016-08-07 DIAGNOSIS — I2510 Atherosclerotic heart disease of native coronary artery without angina pectoris: Secondary | ICD-10-CM | POA: Insufficient documentation

## 2016-08-07 DIAGNOSIS — I739 Peripheral vascular disease, unspecified: Secondary | ICD-10-CM | POA: Insufficient documentation

## 2016-08-07 DIAGNOSIS — I252 Old myocardial infarction: Secondary | ICD-10-CM | POA: Diagnosis not present

## 2016-08-07 NOTE — Progress Notes (Signed)
MARCKUS, [name] ([id number]) Visit Report for 08/07/2016 Chief Complaint Document Details Patient Name: [name], [name] Date of Service: 08/07/2016 8:00 AM Medical Record Number: [id number] Patient Account Number: [id number] Date of Birth/Sex: [birth year] (50 y.o. Male) Treating RN: Clover Mealy, RN, BSN, [place] Sink Primary Care Physician: Angus Palms Other Clinician: Referring Physician: Angus Palms Treating Physician/Extender: Rudene Re in Treatment: 22 Information Obtained from: Patient Chief Complaint Patients presents for treatment of an open diabetic ulcer and pressure injury to his left lower thigh posteriorly for about a year. he is back after her recent consultation at [facility] outpatient prosthetic center Electronic Signature(s) Signed: 08/07/2016 8:34:50 AM By: Evlyn Kanner MD, FACS Entered By: Evlyn Kanner on 08/07/2016 08:34:50 Alexopoulos, Bradie A. ([id number]) -------------------------------------------------------------------------------- HPI Details Patient Name: [name] Date of Service: 08/07/2016 8:00 AM Medical Record Number: [id number] Patient Account Number: [id number] Date of Birth/Sex: 10/19/1966 (50 y.o. Male) Treating RN: Clover Mealy, RN, BSN, [place] Sink Primary Care Physician: Angus Palms Other Clinician: Referring Physician: Angus Palms Treating Physician/Extender: Rudene Re in Treatment: 22 History of Present Illness Location: left lower thigh posteriorly Quality: Patient reports experiencing a dull pain to affected area(s). Severity: Patient states wound are getting worse. 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 HPI Description: 50 year old gentleman with past medical history of diabetes mellitus with last hemoglobin A1c done in February 2016 was 8.7, hypertension, hypothyroidism, coronary artery disease, ischemic cardiomyopathy , nephropathy, obesity, peripheral neuropathy, peripheral vascular disease and sleep apnea. 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 He has had problems with his left BKA prosthesis for a long while and had a vacuum system and then had to be changed over to a sleeve system and has been working with the orthopedic and prosthetic department at [facility]. He has also had a plastic surgery opinion in the past. The ulceration on the posterior part of his lower thigh continued to be aggravating an enlarging and have a foul order and drainage. He works as a Financial risk analyst at [employer] and has to wear his prostheses and standing for long periods of time as he is the only finding member in the family 03/06/2016 -- the notes from the [facility] and was seen by Ms. Lubertha Basque the PA whonoted that he continue to wear his prosthesis against medical advice. details from his past surgical history notes that he's had a cardiac catheterization, coronary angioplasty, amputation of the left leg in 2012, debridement of skin and subcutis tissue in June 2013, application of wound VAC at that time, below knee amputation revision in April 2014. Recommendation was for a new socket to be made which should be above the new wound. He also recommended surgical revision of the redundant skin and to stay off the prosthesis to the wound heals but the patient would not be able to do this because of work. Hence he was advised to establish with a local wound clinic  for management of his current wound and follow-up with the amputee  clinic at [facility] or with Dr. Mindi Curling. If he decided to go back to them for surgery he should see Dr. Lanier Ensign. 03/27/2016 -- he was unable to get his prosthesis made at the place where they had begun to mold and hence he has to find himself a mother prosthetic department and is working on this. 04/10/2016 -- on his left popliteal fossa area he has got a small satellite abrasion with an ulceration which was caused by pulling off the socket on his skin. He still has to see his prosthetic department. KOLTAN, PORTOCARRERO ([id number]) 05/01/2016 -- today he comes with a new problem on his right foot which he says he's had for about 2 months and has been treated by his podiatrist Dr. Ovid Curd. Says due to the fact that his footwear and insoles were worn out, he has developed an ulcerated area which continues to drain sero- sanguinous fluid and has been on doxycycline for about 2 months. He saw Dr. Ardelle Anton who did an x-ray of the foot and has been following up with him for a long while regarding this and managing his Charcot's arthropathy right foot. Today the patient wanted me to take a look at it and agrees to undergo treatment for this. 05/08/2016 -- I have reviewed notes from Dr. Vaughan Sine who saw him on 05/06/2016. after review he asked him to continue with local wound care and started him on ciprofloxacin as well as his doxycycline. note is made of the fact that on his previous visit on 02/01/2016 x-rays were done which showed chronic arthritic changes present in the rear foot due to Charcot deformity. Vessel occification is present. 06/12/2016 -- the patient has not yet got his left below-knee prostheses sleeve which was to be made afresh. He has also not received his right foot orthotic shoe which was to be made at Dr. Kenna Gilbert office. It has been over 2 months since he keeps telling me that he is going to get this done  soon. 07/17/2016 -- shunt has not been to see as in 5 weeks and continues to have issues trying to get his prosthesis for his left below-knee amputation. He has got new superficial abrasion on the medial part of his left knee and this is separate from the large ulceration in the popliteal fossa. Did get his inserts for his diabetic shoe for his right foot and this wound is healed. 08/07/2016 -- patient comes after 3 weeks and has a new wound on his left knee just over the patella from a new prosthesis which she tried for about a week. He is back to wearing his old prostheses at present, which he will use still he gets the new one back. Electronic Signature(s) Signed: 08/07/2016 8:36:03 AM By: Evlyn Kanner MD, FACS Entered By: Evlyn Kanner on 08/07/2016 08:36:03 Fonseca, Jens Som ([id number]) -------------------------------------------------------------------------------- Physical Exam Details Patient Name: [name] Date of Service: 08/07/2016 8:00 AM Medical Record Number: [id number] Patient Account Number: [id number] Date of Birth/Sex: [birth year] (50 y.o. Male) Treating RN: Clover Mealy, RN, BSN, [place] Sink Primary Care Physician: Angus Palms Other Clinician: Referring Physician: Angus Palms Treating Physician/Extender: Rudene Re in Treatment: 22 Constitutional . Pulse regular. Respirations normal and unlabored. Afebrile. . Eyes Nonicteric. Reactive to light. Ears, Nose, Mouth, and Throat Lips, teeth, and gums WNL.Marland Kitchen Moist mucosa without lesions. Neck supple and nontender. No palpable supraclavicular or cervical adenopathy. Normal sized without goiter. Respiratory WNL. No retractions.. Breath sounds WNL, No rubs, rales, rhonchi, or wheeze.. Cardiovascular Heart  rhythm and rate regular, no murmur or gallop.. Pedal Pulses WNL. No clubbing, cyanosis or edema. Chest Breasts symmetical and no nipple discharge.. Breast tissue WNL, no masses, lumps, or  tenderness.. Lymphatic No adneopathy. No adenopathy. No adenopathy. Musculoskeletal Adexa without tenderness or enlargement.. Digits and nails w/o clubbing, cyanosis, infection, petechiae, ischemia, or inflammatory conditions.. Integumentary (Hair, Skin) No suspicious lesions. No crepitus or fluctuance. No peri-wound warmth or erythema. No masses.Marland Kitchen Psychiatric Judgement and insight Intact.. No evidence of depression, anxiety, or agitation.. Notes the original wound in the region of the popliteal fossa continues to have no change. The superficial stage I pressure injury to his left medial knee is also healed. He has 2 new wounds one is a tiny area at the tip of his below-knee skin and this is fairly superficial. Most significant wound is on his left patella and has some slough at the base which is too tender to sharply debride. Electronic Signature(s) Signed: 08/07/2016 8:37:15 AM By: Evlyn Kanner MD, FACS Entered By: Evlyn Kanner on 08/07/2016 08:37:15 Rissler, Jens Som ([id number]) -------------------------------------------------------------------------------- Physician Orders Details Patient Name: [name] Date of Service: 08/07/2016 8:00 AM Medical Record Number: [id number] Patient Account Number: [id number] Date of Birth/Sex: 10-13-1966 (50 y.o. Male) Treating RN: Clover Mealy, RN, BSN, [place] Sink Primary Care Physician: Angus Palms Other Clinician: Referring Physician: Angus Palms Treating Physician/Extender: Rudene Re in Treatment: 23 Verbal / Phone Orders: Yes Clinician: Afful, RN, BSN, Rita Read Back and Verified: Yes Diagnosis Coding Wound Cleansing Wound #3 Left,Posterior Amputation Site - Below Knee o Clean wound with Normal Saline. Wound #6 Left,Anterior Amputation Site - Below Knee o Clean wound with Normal Saline. Skin Barriers/Peri-Wound Care Wound #3 Left,Posterior Amputation Site - Below Knee o Barrier cream Wound #6 Left,Anterior  Amputation Site - Below Knee o Barrier cream Primary Wound Dressing Wound #3 Left,Posterior Amputation Site - Below Knee o Other: - Mediplex Ag or Equivalent as approved and covered by insurance Wound #6 Left,Anterior Amputation Site - Below Knee o Medihoney gel Secondary Dressing Wound #3 Left,Posterior Amputation Site - Below Knee o Gauze and Kerlix/Conform Wound #6 Left,Anterior Amputation Site - Below Knee o Boardered Foam Dressing Dressing Change Frequency Wound #3 Left,Posterior Amputation Site - Below Knee o Change dressing every day. Wound #6 Left,Anterior Amputation Site - Below Knee o Change dressing every day. Follow-up Appointments ASHLAND, WISEMAN ([id number]) Wound #3 Left,Posterior Amputation Site - Below Knee o Return Appointment in 1 week. Wound #6 Left,Anterior Amputation Site - Below Knee o Return Appointment in 1 week. Electronic Signature(s) Signed: 08/07/2016 11:43:27 AM By: Evlyn Kanner MD, FACS Signed: 08/07/2016 2:51:11 PM By: Elpidio Eric BSN, RN Entered By: Elpidio Eric on 08/07/2016 08:26:15 ZYHEIR, DAFT ([id number]) -------------------------------------------------------------------------------- Prescription 08/07/2016 Patient Name: Idamae Schuller Physician: Evlyn Kanner MD Date of Birth: February 25, 1966 NPI#: [id number] Sex: M DEA#: [id number] Phone #: [phone] License #: Patient Address: [facility] [address] [place], [place] [address] [facility] [address], [address] [place], [place] [address] [phone] Allergies amitriptyline Reaction: nausea Severity: Severe Pravachol Reaction: muscles ache and elevated CK Severity: Severe Septra Reaction: mouth ulcers Severity: Severe ACE Inhibitors Reaction: increases creatinine Severity: Severe lovastatin Reaction: myalgia Severity: Severe adhesive Reaction: rash Severity: Severe Iodinated Contrast  Media - Oral and IV Dye Reaction: kidney disorder Severity: Severe Sabel, Markail A. ([id number]) Tree Nuts Reaction: anaphalaxis Severity: Severe Physician's Orders Other: - Mediplex Ag or Equivalent as approved and covered by insurance Signature(s): Date(s): Electronic Signature(s)  Signed: 08/07/2016 11:43:27 AM By: Evlyn Kanner MD, FACS Signed: 08/07/2016 2:51:11 PM By: Elpidio Eric BSN, RN Entered By: Elpidio Eric on 08/07/2016 08:26:16 Carby, Saulo AMarland Kitchen ([id number]) --------------------------------------------------------------------------------  Problem List Details Patient Name: [name] Date of Service: 08/07/2016 8:00 AM Medical Record Number: [id number] Patient Account Number: [id number] Date of Birth/Sex: 01-14-1966 (50 y.o. 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/06/2016 Yes exposed M70.852 Other soft tissue disorders related to use, overuse and 03/06/2016 Yes pressure, left thigh E66.01 Morbid (severe) obesity due to excess calories 03/06/2016 Yes L97.412 Non-pressure chronic ulcer of right heel and midfoot with 05/01/2016 Yes fat layer exposed M14.671 Charcot's joint, right ankle and foot 05/01/2016 Yes S81.002A Unspecified open wound, left knee, initial encounter 08/07/2016 Yes Inactive Problems Resolved Problems Electronic Signature(s) Signed: 08/07/2016 8:34:42 AM By: Evlyn Kanner MD, FACS Entered By: Evlyn Kanner on 08/07/2016 08:34:42 Kovacich, Laiden A. ([id number]) -------------------------------------------------------------------------------- Progress Note Details Patient Name:  [name] Date of Service: 08/07/2016 8:00 AM Medical Record Number: [id number] Patient Account Number: [id number] Date of Birth/Sex: Mar 25, 1966 (50 y.o. Male) Treating RN: Clover Mealy, RN, BSN, [place] Sink Primary Care Physician: Angus Palms Other Clinician: Referring Physician: Angus Palms Treating Physician/Extender: Rudene Re in Treatment: 22 Subjective Chief Complaint Information obtained from Patient Patients presents for treatment of an open diabetic ulcer and pressure injury to his left lower thigh posteriorly for about a year. he is back after her recent consultation at [facility] outpatient prosthetic center History of Present Illness (HPI) The following HPI elements were documented for the patient's wound: Location: left lower thigh posteriorly Quality: Patient reports experiencing a dull pain to affected area(s). Severity: Patient states wound are getting worse. 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 50 year old gentleman with past medical history of diabetes mellitus with last hemoglobin A1c done in February 2016 was 8.7, hypertension, hypothyroidism, coronary artery disease, ischemic cardiomyopathy , nephropathy, obesity, peripheral neuropathy, peripheral vascular disease and sleep apnea. 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 He has had problems with his left BKA prosthesis for a long while  and had a vacuum system and then had to be changed over to a sleeve system and has been working with the orthopedic and prosthetic department at [facility]. He has also had a plastic surgery opinion in the past. The ulceration on the posterior part of his lower thigh continued to be aggravating an enlarging and have a foul order and drainage. 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 Victorino Dike  Dye the PA whonoted that he continue to wear his prosthesis against medical advice. details from his past surgical history notes that he's had a cardiac catheterization, coronary angioplasty, amputation of the left leg in 2012, debridement of skin and subcutis tissue in June 2013, application of wound VAC at that time, below knee amputation revision in April 2014. Insalaco, Xayden A. ([id number]) Recommendation was for a new socket to be made which should be above the new wound. He also recommended surgical revision of the redundant skin and to stay off the prosthesis to the wound heals but the patient would not be able to do this because of work. Hence he was advised to establish with a local wound clinic for management of his current wound and follow-up with the amputee clinic at [facility] or with Dr. Mindi Curlingawney. If he decided to go back to them for surgery he should see Dr. Lanier Ensigneilley. 03/27/2016 -- he was unable to get his prosthesis made at the place where they had begun to mold and hence he has to find himself a mother prosthetic department and is working on this. 04/10/2016 -- on his left popliteal fossa area he has got a small satellite abrasion with an ulceration which was caused by pulling off the socket on his skin. He still has to see his prosthetic department. 05/01/2016 -- today he comes with a new problem on his right foot which he says  he's had for about 2 months and has been treated by his podiatrist Dr. Ovid CurdMatthew Wagoner. Says due to the fact that his footwear and insoles were worn out, he has developed an ulcerated area which continues to drain sero- sanguinous fluid and has been on doxycycline for about 2 months. He saw Dr. Ardelle AntonWagoner who did an x-ray of the foot and has been following up with him for a long while regarding this and managing his Charcot's arthropathy right foot. Today the patient wanted me to take a look at it and agrees to undergo treatment for this. 05/08/2016 -- I have reviewed notes from Dr. Vaughan SineMatthew Wagner who saw him on 05/06/2016. after review he asked him to continue with local wound care and started him on ciprofloxacin as well as his doxycycline. note is made of the fact that on his previous visit on 02/01/2016 x-rays were done which showed chronic arthritic changes present in the rear foot due to Charcot deformity. Vessel occification is present. 06/12/2016 -- the patient has not yet got his left below-knee prostheses sleeve which was to be made afresh. He has also not received his right foot orthotic shoe which was to be made at Dr. Kenna GilbertWagner's office. It has been over 2 months since he keeps telling me that he is going to get this done soon. 07/17/2016 -- shunt has not been to see as in 5 weeks and continues to have issues trying to get his prosthesis for his left below-knee amputation. He has got new superficial abrasion on the medial part of his left knee and this is separate from the large ulceration in the popliteal fossa. Did get his inserts for his diabetic shoe for his right foot and this wound is healed. 08/07/2016 -- patient comes after 3 weeks and has a new wound on his left knee just over the patella from a new prosthesis which she tried for about a week. He is back to wearing his old prostheses at present, which he will use still he gets the new one back. Objective Constitutional Pulse  regular. Respirations  normal and unlabored. Afebrile. Vitals Time Taken: 8:09 AM, Height: 66 in, Weight: 280 lbs, BMI: 45.2, Temperature: 98.2 F, Pulse: 74 bpm, Respiratory Rate: 18 breaths/min, Blood Pressure: 121/68 mmHg. Eyes Nonicteric. Reactive to light. Ears, Nose, Mouth, and Throat Durkee, Timm A. ([id number]) Lips, teeth, and gums WNL.Marland Kitchen Moist mucosa without lesions. Neck supple and nontender. No palpable supraclavicular or cervical adenopathy. Normal sized without goiter. Respiratory WNL. No retractions.. Breath sounds WNL, No rubs, rales, rhonchi, or wheeze.. Cardiovascular Heart rhythm and rate regular, no murmur or gallop.. Pedal Pulses WNL. No clubbing, cyanosis or edema. Chest Breasts symmetical and no nipple discharge.. Breast tissue WNL, no masses, lumps, or tenderness.. Lymphatic No adneopathy. No adenopathy. No adenopathy. Musculoskeletal Adexa without tenderness or enlargement.. Digits and nails w/o clubbing, cyanosis, infection, petechiae, ischemia, or inflammatory conditions.Marland Kitchen Psychiatric Judgement and insight Intact.. No evidence of depression, anxiety, or agitation.. General Notes: the original wound in the region of the popliteal fossa continues to have no change. The superficial stage I pressure injury to his left medial knee is also healed. He has 2 new wounds one is a tiny area at the tip of his below-knee skin and this is fairly superficial. Most significant wound is on his left patella and has some slough at the base which is too tender to sharply debride. Integumentary (Hair, Skin) No suspicious lesions. No crepitus or fluctuance. No peri-wound warmth or erythema. No masses.. Wound #3 status is Open. Original cause of wound was Pressure Injury. The wound is located on the Left,Posterior Amputation Site - Below Knee. The wound measures 4.3cm length x 9cm width x 0.2cm depth; 30.395cm^2 area and 6.079cm^3 volume. The wound is limited to skin breakdown.  There is no tunneling or undermining noted. There is a large amount of purulent drainage noted. The wound margin is thickened. There is large (67-100%) pink granulation within the wound bed. There is a small (1-33%) amount of necrotic tissue within the wound bed including Adherent Slough. The periwound skin appearance exhibited: Scarring, Maceration, Moist. The periwound skin appearance did not exhibit: Callus, Crepitus, Excoriation, Fluctuance, Friable, Induration, Localized Edema, Rash, Dry/Scaly, Atrophie Blanche, Cyanosis, Ecchymosis, Hemosiderin Staining, Mottled, Pallor, Rubor, Erythema. Periwound temperature was noted as No Abnormality. The periwound has tenderness on palpation. Wound #5 status is Healed - Epithelialized. Original cause of wound was Blister. The wound is located on the Left,Medial Amputation Site - Below Knee. The wound measures 0cm length x 0cm width x 0cm depth; 0cm^2 area and 0cm^3 volume. The wound is limited to skin breakdown. There is no tunneling or undermining noted. There is a none present amount of drainage noted. The wound margin is flat and intact. There is no granulation within the wound bed. There is no necrotic tissue within the wound bed. The periwound skin appearance exhibited: Dry/Scaly. The periwound skin appearance did not exhibit: Callus, Crepitus, Excoriation, Fluctuance, Friable, Induration, Localized Edema, Rash, Scarring, Maceration, Moist, Patchen, Kjuan A. ([id number]) Atrophie Blanche, Cyanosis, Ecchymosis, Hemosiderin Staining, Mottled, Pallor, Rubor, Erythema. Periwound temperature was noted as No Abnormality. Wound #6 status is Open. Original cause of wound was Shear/Friction. The wound is located on the Left,Anterior Amputation Site - Below Knee. The wound measures 2cm length x 1.8cm width x 0.2cm depth; 2.827cm^2 area and 0.565cm^3 volume. The wound is limited to skin breakdown. There is no tunneling or undermining noted. There is a large  amount of serosanguineous drainage noted. The wound margin is distinct with the outline attached to the wound base. There is medium (34-66%)  pink, pale granulation within the wound bed. There is a small (1-33%) amount of necrotic tissue within the wound bed including Adherent Slough. The periwound skin appearance exhibited: Maceration, Moist. The periwound skin appearance did not exhibit: Callus, Crepitus, Excoriation, Fluctuance, Friable, Induration, Localized Edema, Rash, Scarring, Dry/Scaly, Atrophie Blanche, Cyanosis, Ecchymosis, Hemosiderin Staining, Mottled, Pallor, Rubor, Erythema. Periwound temperature was noted as No Abnormality. 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 After a prolonged wait, he finally got a new prosthesis to offload the area of concern in his popliteal region. However this has caused a new wound on the patella and hence the problem still persist. recommended silver alginate to the popliteal fossa and made he has any to the left patella both being protected with bordered foam and offloading as much as possible. All other supportive care which we have been discussing with him for a long while has been reemphasized. Plan Wound Cleansing: Wound #3 Left,Posterior Amputation Site - Below Knee: Diez, Martise A. ([id number]) Clean wound with Normal  Saline. Wound #6 Left,Anterior Amputation Site - Below Knee: Clean wound with Normal Saline. Skin Barriers/Peri-Wound Care: Wound #3 Left,Posterior Amputation Site - Below Knee: Barrier cream Wound #6 Left,Anterior Amputation Site - Below Knee: Barrier cream Primary Wound Dressing: Wound #3 Left,Posterior Amputation Site - Below Knee: Other: - Mediplex Ag or Equivalent as approved and covered by insurance Wound #6 Left,Anterior Amputation Site - Below Knee: Medihoney gel Secondary Dressing: Wound #3 Left,Posterior Amputation Site - Below Knee: Gauze and Kerlix/Conform Wound #6 Left,Anterior Amputation Site - Below Knee: Boardered Foam Dressing Dressing Change Frequency: Wound #3 Left,Posterior Amputation Site - Below Knee: Change dressing every day. Wound #6 Left,Anterior Amputation Site - Below Knee: Change dressing every day. Follow-up Appointments: Wound #3 Left,Posterior Amputation Site - Below Knee: Return Appointment in 1 week. Wound #6 Left,Anterior Amputation Site - Below Knee: Return Appointment in 1 week. This is a complex patient who is noncompliant because he works 12 hours a day using his prosthesis and has no recourse because it's the only means for a paycheck. After a prolonged wait, he finally got a new prosthesis to offload the area of concern in his popliteal region. However this has caused a new wound on the patella and hence the problem still persist. recommended silver alginate to the popliteal fossa and made he has any to the left patella both being protected with bordered foam and offloading as much as possible. All other supportive care which we have been discussing with him for a long while has been reemphasized. Electronic Signature(s) Signed: 08/07/2016 8:39:42 AM By: Evlyn Kanner MD, FACS Entered By: Evlyn Kanner on 08/07/2016 08:39:42 Haubner, Dorin AMarland Kitchen ([id number]) Haecker, Jens Som  ([id number]) -------------------------------------------------------------------------------- SuperBill Details Patient Name: [name] Date of Service: 08/07/2016 Medical Record Number: [id number] Patient Account Number: [id number] Date of Birth/Sex: 08-20-1966 (50 y.o. 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 M14.671 Charcot's joint, right ankle and foot S81.002A Unspecified open wound, left knee, initial encounter Facility Procedures CPT4 Code: 16109604 Description: 99213 - WOUND CARE VISIT-LEV 3 EST PT Modifier: Quantity: 1 Physician Procedures CPT4 Code: 5409811 Description: 99213 - WC PHYS LEVEL 3 - EST PT ICD-10 Description Diagnosis E11.622 Type 2 diabetes mellitus with other skin ulcer L97.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

## 2016-08-08 NOTE — Progress Notes (Signed)
TOMIO, KIRK ([id number]) Visit Report for 08/07/2016 Arrival Information Details Patient Name: [name], [name] Date of Service: 08/07/2016 8:00 AM Medical Record Number: [id number] Patient Account Number: [id number] Date of Birth/Sex: [birth year] (50 y.o. Male) Treating RN: Huel Coventry Primary Care Physician: Angus Palms Other Clinician: Referring Physician: Angus Palms Treating Physician/Extender: Rudene Re in Treatment: 22 Visit Information History Since Last Visit Added or deleted any medications: No Patient Arrived: Ambulatory Any new allergies or adverse reactions: No Arrival Time: 08:09 Had a fall or experienced change in No Accompanied By: self activities of daily living that may affect Transfer Assistance: None risk of falls: Patient Identification Verified: Yes Signs or symptoms of abuse/neglect since last No Secondary Verification Process Yes visito Completed: Hospitalized since last visit: No Patient Requires Transmission-Based No Has Dressing in Place as Prescribed: Yes Precautions: Pain Present Now: No Patient Has Alerts: No Electronic Signature(s) Signed: 08/08/2016 10:50:50 AM By: Elliot Gurney, RN, BSN, Kim RN, BSN Entered By: Elliot Gurney, RN, BSN, Kim on 08/07/2016 08:09:25 [place], Jens Som ([id number]) -------------------------------------------------------------------------------- Clinic Level of Care Assessment Details Patient Name: Richard Kyle A. Date of Service: 08/07/2016 8:00 AM Medical Record Number: [id number] Patient Account Number: [id number] Date of Birth/Sex: [birth year] (50 y.o. Male) Treating RN: Clover Mealy, RN, BSN, Rita Primary Care Physician: Angus Palms Other Clinician: Referring Physician: Angus Palms Treating Physician/Extender: Rudene Re in Treatment: 22 Clinic Level of Care Assessment Items TOOL 4 Quantity Score []  - Use when only an EandM is performed on FOLLOW-UP visit 0 ASSESSMENTS - Nursing Assessment  / Reassessment X - Reassessment of Co-morbidities (includes updates in patient status) 1 10 X - Reassessment of Adherence to Treatment Plan 1 5 ASSESSMENTS - Wound and Skin Assessment / Reassessment []  - Simple Wound Assessment / Reassessment - one wound 0 X - Complex Wound Assessment / Reassessment - multiple wounds 2 5 []  - Dermatologic / Skin Assessment (not related to wound area) 0 ASSESSMENTS - Focused Assessment []  - Circumferential Edema Measurements - multi extremities 0 []  - Nutritional Assessment / Counseling / Intervention 0 []  - Lower Extremity Assessment (monofilament, tuning fork, pulses) 0 []  - Peripheral Arterial Disease Assessment (using hand held doppler) 0 ASSESSMENTS - Ostomy and/or Continence Assessment and Care []  - Incontinence Assessment and Management 0 []  - Ostomy Care Assessment and Management (repouching, etc.) 0 PROCESS - Coordination of Care X - Simple Patient / Family Education for ongoing care 1 15 []  - Complex (extensive) Patient / Family Education for ongoing care 0 []  - Staff obtains Chiropractor, Records, Test Results / Process Orders 0 []  - Staff telephones HHA, Nursing Homes / Clarify orders / etc 0 []  - Routine Transfer to another Facility (non-emergent condition) 0 Clevinger, [name] ([id number]) []  - Routine Hospital Admission (non-emergent condition) 0 []  - New Admissions / Manufacturing engineer / Ordering NPWT, Apligraf, etc. 0 []  - Emergency Hospital Admission (emergent condition) 0 []  - Simple Discharge Coordination 0 []  - Complex (extensive) Discharge Coordination 0 PROCESS - Special Needs []  - Pediatric / Minor Patient Management 0 []  - Isolation Patient Management 0 []  - Hearing / Language / Visual special needs 0 []  - Assessment of Community assistance (transportation, D/C planning, etc.) 0 []  - Additional assistance / Altered mentation 0 []  - Support Surface(s) Assessment (bed, cushion, seat, etc.) 0 INTERVENTIONS - Wound Cleansing /  Measurement []  - Simple Wound Cleansing - one wound 0 X - Complex Wound Cleansing - multiple wounds 2 5 X - Wound Imaging (photographs - any number  of wounds) 1 5 []  - Wound Tracing (instead of photographs) 0 []  - Simple Wound Measurement - one wound 0 X - Complex Wound Measurement - multiple wounds 2 5 INTERVENTIONS - Wound Dressings []  - Small Wound Dressing one or multiple wounds 0 X - Medium Wound Dressing one or multiple wounds 2 15 []  - Large Wound Dressing one or multiple wounds 0 []  - Application of Medications - topical 0 []  - Application of Medications - injection 0 INTERVENTIONS - Miscellaneous []  - External ear exam 0 Ewing, [name] ([id number]) []  - Specimen Collection (cultures, biopsies, blood, body fluids, etc.) 0 []  - Specimen(s) / Culture(s) sent or taken to Lab for analysis 0 []  - Patient Transfer (multiple staff / Michiel Sites Lift / Similar devices) 0 []  - Simple Staple / Suture removal (25 or less) 0 []  - Complex Staple / Suture removal (26 or more) 0 []  - Hypo / Hyperglycemic Management (close monitor of Blood Glucose) 0 []  - Ankle / Brachial Index (ABI) - do not check if billed separately 0 []  - Vital Signs 0 Has the patient been seen at the hospital within the last three years: Yes Total Score: 95 Level Of Care: New/Established - Level 3 Electronic Signature(s) Signed: 08/07/2016 2:51:11 PM By: Elpidio Eric BSN, RN Entered By: Elpidio Eric on 08/07/2016 08:38:05 Pacitti, [name] Kitchen ([id number]) -------------------------------------------------------------------------------- Encounter Discharge Information Details Patient Name: Richard Kyle A. Date of Service: 08/07/2016 8:00 AM Medical Record Number: [id number] Patient Account Number: [id number] Date of Birth/Sex: September 14, 1966 (50 y.o. Male) Treating RN: Clover Mealy, RN, BSN, [place] Sink Primary Care Physician: Angus Palms Other Clinician: Referring Physician: Angus Palms Treating Physician/Extender: Rudene Re in Treatment: 22 Encounter Discharge Information Items Discharge Pain Level: 0 Discharge Condition: Stable Ambulatory Status: Cane Discharge Destination: Home Transportation: Private Auto Accompanied By: self Schedule Follow-up Appointment: No Medication Reconciliation completed No and provided to Patient/Care Holiday Mcmen[place]: Provided on Clinical Summary of Care: 08/07/2016 Form Type Recipient Paper Patient DP Electronic Signature(s) Signed: 08/07/2016 2:51:11 PM By: Elpidio Eric BSN, RN Previous Signature: 08/07/2016 8:40:41 AM Version By: Gwenlyn Perking Entered By: Elpidio Eric on 08/07/2016 08:42:35 Shehadeh, [name] ([id number]) -------------------------------------------------------------------------------- Lower Extremity Assessment Details Patient Name: Richard Kyle A. Date of Service: 08/07/2016 8:00 AM Medical Record Number: [id number] Patient Account Number: [id number] Date of Birth/Sex: 1966-05-09 (50 y.o. Male) Treating RN: Clover Mealy, RN, BSN, [place] Sink Primary Care Physician: Angus Palms Other Clinician: Referring Physician: Angus Palms Treating Physician/Extender: Rudene Re in Treatment: 22 Electronic Signature(s) Signed: 08/07/2016 2:51:11 PM By: Elpidio Eric BSN, RN Entered By: Elpidio Eric on 08/07/2016 08:10:48 [place], [place] [name] Kitchen ([id number]) -------------------------------------------------------------------------------- Multi Wound Chart Details Patient Name: Richard Kyle A. Date of Service: 08/07/2016 8:00 AM Medical Record Number: [id number] Patient Account Number: [id number] Date of Birth/Sex: 1966/10/16 (50 y.o. 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] [5:Left Amputation Site - Below Knee - Medial] [6:Left Amputation Site - Below Knee - Anterior] Wounding Event: [3:Pressure Injury] [5:Blister] [6:Shear/Friction] 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] [5:the Lower Extremity] [6:the Lower Extremity] Comorbid History: [3:Chronic sinus problems/congestion, Anemia, Asthma, Sleep Anemia, Asthma, Sleep Anemia, Asthma, Sleep Apnea,  Coronary Artery Apnea, Coronary Artery Apnea, Coronary Artery Disease, Hypertension, Myocardial Infarction, Type II Diabetes,  History Type II Diabetes, History Type II Diabetes, History of pressure wounds, Gout, of pressure wounds, Gout, of pressure wounds, Gout, Neuropathy] [5:Chronic sinus problems/congestion, Disease, Hypertension, Myocardial Infarction, Neuropathy]  [6:Chronic sinus problems/congestion, Disease, Hypertension, Myocardial Infarction, Neuropathy] Date Acquired: [3:06/26/2015] [5:07/16/2016] [6:07/20/2016] Weeks of Treatment: [3:22] [5:3] [6:0] Wound Status: [3:Open] [5:Healed - Epithelialized] [6:Open] Measurements L x W x D 4.3x9x0.2 [5:0x0x0] [6:2x1.8x0.2] (cm) Area (cm) : [3:30.395] [5:0] [6:2.827] Volume (cm) : [3:6.079] [5:0] [6:0.565] % Reduction in Area: [3:-7.50%] [5:100.00%] [6:N/A] % Reduction in Volume: -7.50% [5:100.00%] [6:N/A] Classification: [3:Grade 2] [5:Grade 1] [6:Grade 1] Exudate Amount: [3:Large] [5:None Present] [6:Large] Exudate Type: [3:Purulent] [5:N/A] [6:Serosanguineous] Exudate Color: [3:yellow, brown, green] [5:N/A] [6:red, brown] Foul Odor After [3:Yes] [5:No] [6:No] Cleansing: Carnegie, [name] ([id number]) Odor Anticipated Due to No N/A N/A Product Use: Wound Margin: Thickened Flat and Intact Distinct, outline  attached Granulation Amount: Large (67-100%) None Present (0%) Medium (34-66%) Granulation Quality: Pink N/A Pink, Pale Necrotic Amount: Small (1-33%) None Present (0%) Small (1-33%) Exposed Structures: Fascia: No Fascia: No Fascia: No Fat: No Fat: No Fat: No Tendon: No Tendon: No Tendon: No Muscle: No Muscle: No Muscle: No Joint: No Joint: No Joint: No Bone: No Bone: No Bone: No Limited to Skin Limited to Skin Limited to Skin Breakdown Breakdown Breakdown Epithelialization: None Large (67-100%) None Periwound Skin Texture: Scarring: Yes Edema: No Edema: No Edema: No Excoriation: No Excoriation: No Excoriation: No Induration: No Induration: No Induration: No Callus: No Callus: No Callus: No Crepitus: No Crepitus: No Crepitus: No Fluctuance: No Fluctuance: No Fluctuance: No Friable: No Friable: No Friable: No Rash: No Rash: No Rash: No Scarring: No Scarring: No Periwound Skin Maceration: Yes Dry/Scaly: Yes Maceration: Yes Moisture: Moist: Yes Maceration: No Moist: Yes Dry/Scaly: No Moist: No Dry/Scaly: No Periwound Skin Color: Atrophie Blanche: No Atrophie Blanche: No Atrophie Blanche: No Cyanosis: No Cyanosis: No Cyanosis: No Ecchymosis: No Ecchymosis: No Ecchymosis: No Erythema: No Erythema: No Erythema: No Hemosiderin Staining: No Hemosiderin Staining: No Hemosiderin Staining: No Mottled: No Mottled: No Mottled: No Pallor: No Pallor: No Pallor: No Rubor: No Rubor: No Rubor: No Temperature: No Abnormality No Abnormality No Abnormality Tenderness on Yes No No Palpation: Wound Preparation: Ulcer Cleansing: Ulcer Cleansing: Ulcer Cleansing: Rinsed/Irrigated with Rinsed/Irrigated with Rinsed/Irrigated with Saline Saline Saline Topical Anesthetic Topical Anesthetic Topical Anesthetic Applied: None, Other: Applied: None Applied: None lidocaine 4% Treatment Notes Electronic Signature(s) CHAYANNE, SPEIR ([id number]) Signed:  08/07/2016 2:51:11 PM By: Elpidio Eric BSN, RN Entered By: Elpidio Eric on 08/07/2016 08:24:47 Junio, [name] Kitchen ([id number]) -------------------------------------------------------------------------------- Multi-Disciplinary Care Plan Details Patient Name: Richard Kyle A. Date of Service: 08/07/2016 8:00 AM Medical Record Number: [id number] Patient Account Number: [id number] Date of Birth/Sex: [birth year] (50 y.o. Male) Treating RN: Clover Mealy, RN, BSN, [place] Sink Primary Care Physician: Angus Palms Other Clinician: Referring Physician: Angus Palms Treating Physician/Extender: Rudene Re in Treatment: 22 Active Inactive Abuse / Safety / Falls / Self Care Management Nursing Diagnoses: Knowledge deficit related to: safety; personal, health (wound), emergency Potential for falls Self care deficit: actual or potential Goals: Patient will remain injury free Date Initiated: 03/06/2016 Goal Status: Active Patient/caregiver will verbalize understanding of skin care regimen Date Initiated: 03/06/2016 Goal Status: Active Patient/caregiver will verbalize/demonstrate measure taken to improve self care Date Initiated: 03/06/2016 Goal Status: Active Patient/caregiver will verbalize/demonstrate measures taken to improve the patient's personal safety Date Initiated: 03/06/2016 Goal Status: Active Patient/caregiver will  verbalize/demonstrate measures taken to prevent injury and/or falls Date Initiated: 03/06/2016 Goal Status: Active Patient/caregiver will verbalize/demonstrate understanding of what to do in case of emergency Date Initiated: 03/06/2016 Goal Status: Active Interventions: Assess fall risk on admission and as needed Assess: immobility, friction, shearing, incontinence upon admission and as needed Assess impairment of mobility on admission and as needed per policy Assess self care needs on admission and as needed Patient referred to community resources (specify in notes) Provide  education on basic hygiene Provide education on fall prevention Lalani, [name] ([id number]) Provide education on personal and home safety Provide education on safe transfers Treatment Activities: Education provided on Basic Hygiene : 05/29/2016 Notes: Orientation to the Wound Care Program Nursing Diagnoses: Knowledge deficit related to the wound healing center program Goals: Patient/caregiver will verbalize understanding of the Wound Healing Center Program Date Initiated: 03/06/2016 Goal Status: Active Interventions: Provide education on orientation to the wound center Notes: Pressure Nursing Diagnoses: Knowledge deficit related to causes and risk factors for pressure ulcer development Knowledge deficit related to management of pressures ulcers Potential for impaired tissue integrity related to pressure, friction, moisture, and shear Goals: Patient will remain free from development of additional pressure ulcers Date Initiated: 03/06/2016 Goal Status: Active Patient will remain free of pressure ulcers Date Initiated: 03/06/2016 Goal Status: Active Patient/caregiver will verbalize risk factors for pressure ulcer development Date Initiated: 03/06/2016 Goal Status: Active Patient/caregiver will verbalize understanding of pressure ulcer management Date Initiated: 03/06/2016 Goal Status: Active Interventions: Assess: immobility, friction, shearing, incontinence upon admission and as needed Assess offloading mechanisms upon admission and as needed Duffy, [name] ([id number]) Assess potential for pressure ulcer upon admission and as needed Provide education on pressure ulcers Notes: Wound/Skin Impairment Nursing Diagnoses: Impaired tissue integrity Knowledge deficit related to smoking impact on wound healing Knowledge deficit related to ulceration/compromised skin integrity Goals: Patient/caregiver will verbalize understanding of skin care regimen Date Initiated:  03/06/2016 Goal Status: Active Ulcer/skin breakdown will have a volume reduction of 30% by week 4 Date Initiated: 03/06/2016 Goal Status: Active Ulcer/skin breakdown will have a volume reduction of 50% by week 8 Date Initiated: 03/06/2016 Goal Status: Active Ulcer/skin breakdown will have a volume reduction of 80% by week 12 Date Initiated: 03/06/2016 Goal Status: Active Ulcer/skin breakdown will heal within 14 weeks Date Initiated: 03/06/2016 Goal Status: Active Interventions: Assess patient/caregiver ability to obtain necessary supplies Assess patient/caregiver ability to perform ulcer/skin care regimen upon admission and as needed Assess ulceration(s) every visit Provide education on ulcer and skin care Treatment Activities: Referred to DME Zehava Turski for dressing supplies : 03/06/2016 Skin care regimen initiated : 03/06/2016 Topical wound management initiated : 03/06/2016 Notes: Electronic Signature(s) Signed: 08/07/2016 2:51:11 PM By: Elpidio EricAfful, Rita BSN, RN Entered By: Elpidio EricAfful, Rita on 08/07/2016 08:24:39 Vallin, [name] ([id number]) [place], [name] Kitchen. ([id number]) -------------------------------------------------------------------------------- Pain Assessment Details Patient Name: Richard KylePHILLIPS, Cale A. Date of Service: 08/07/2016 8:00 AM Medical Record Number: [id number] Patient Account Number: [id number] Date of Birth/Sex: [birth year](50 y.o. Male) Treating RN: Huel CoventryWoody, Kim Primary Care Physician: Angus PalmsGEORGE, SIONNE Other Clinician: Referring Physician: Angus PalmsGEORGE, SIONNE Treating Physician/Extender: Rudene ReBritto, Errol Weeks in Treatment: 22 Active Problems Location of Pain Severity and Description of Pain Patient Has Paino Yes Site Locations Pain Location: Pain in Ulcers With Dressing Change: Yes Pain Management and Medication Current Pain Management: Electronic Signature(s) Signed: 08/08/2016 10:50:50 AM By: Elliot GurneyWoody, RN, BSN, Kim RN, BSN Entered By: Elliot GurneyWoody, RN, BSN, Kim on 08/07/2016  08:09:36 [place], Jens SomARREN A. ([id number]) -------------------------------------------------------------------------------- Patient/Caregiver Education Details Patient Name: Vear ClockPHILLIPS,  Remberto A. Date of Service: 08/07/2016 8:00 AM Medical Record Number: [id number] Patient Account Number: [id number] Date of Birth/Gender: [birth year] (50 y.o. Male) Treating RN: Clover Mealy, RN, BSN, [place] Sink Primary Care Physician: Angus Palms Other Clinician: Referring Physician: Angus Palms Treating Physician/Extender: Rudene Re in Treatment: 22 Education Assessment Education Provided To: Patient Education Topics Provided Basic Hygiene: Methods: Explain/Verbal Responses: State content correctly Pressure: Methods: Explain/Verbal Responses: State content correctly Safety: Methods: Explain/Verbal Responses: State content correctly Welcome To The [facility]: Methods: Explain/Verbal Responses: State content correctly Wound/Skin Impairment: Methods: Explain/Verbal Responses: State content correctly Electronic Signature(s) Signed: 08/07/2016 2:51:11 PM By: Elpidio Eric BSN, RN Entered By: Elpidio Eric on 08/07/2016 08:42:59 Naeve, Antwoin A. ([id number]) -------------------------------------------------------------------------------- Wound Assessment Details Patient Name: Richard Kyle A. Date of Service: 08/07/2016 8:00 AM Medical Record Number: [id number] Patient Account Number: [id number] Date of Birth/Sex: Sep 12, 1966 (50 y.o. Male) Treating RN: Clover Mealy, RN, BSN, Rita Primary Care Physician: Angus Palms Other Clinician: Referring Physician: Angus Palms Treating Physician/Extender: Rudene Re in Treatment: 22 Wound Status Wound Number: 3 Primary Diabetic Wound/Ulcer of the Lower Etiology: Extremity Wound Location: Left Amputation Site - Below Knee - Posterior Wound Open Status: Wounding Event: Pressure Injury Comorbid Chronic sinus problems/congestion, Date  Acquired: 06/26/2015 History: Anemia, Asthma, Sleep Apnea, Coronary Weeks Of Treatment: 22 Artery Disease, Hypertension, Myocardial Clustered Wound: No Infarction, Type II Diabetes, History of pressure wounds, Gout, Neuropathy Photos Photo Uploaded By: Elpidio Eric on 08/07/2016 13:59:09 Wound Measurements Length: (cm) 4.3 Width: (cm) 9 Depth: (cm) 0.2 Area: (cm) 30.395 Volume: (cm) 6.079 % Reduction in Area: -7.5% % Reduction in Volume: -7.5% Epithelialization: None Tunneling: No Undermining: No Wound Description Classification: Grade 2 Foul Odor Aft Wound Margin: Thickened Due to Produc Exudate Amount: Large Exudate Type: Purulent Exudate Color: yellow, brown, green er Cleansing: Yes t Use: No Wound Bed Granulation Amount: Large (67-100%) Exposed Structure Granulation Quality: Pink Fascia Exposed: No Metsker, [name] ([id number]) Necrotic Amount: Small (1-33%) Fat Layer Exposed: No Necrotic Quality: Adherent Slough Tendon Exposed: No Muscle Exposed: No Joint Exposed: No Bone Exposed: No Limited to Skin Breakdown Periwound Skin Texture Texture Color No Abnormalities Noted: No No Abnormalities Noted: No Callus: No Atrophie Blanche: No Crepitus: No Cyanosis: No Excoriation: No Ecchymosis: No Fluctuance: No Erythema: No Friable: No Hemosiderin Staining: No Induration: No Mottled: No Localized Edema: No Pallor: No Rash: No Rubor: No Scarring: Yes Temperature / Pain Moisture Temperature: No Abnormality No Abnormalities Noted: No Tenderness on Palpation: Yes Dry / Scaly: No Maceration: Yes Moist: Yes Wound Preparation Ulcer Cleansing: Rinsed/Irrigated with Saline Topical Anesthetic Applied: None, Other: lidocaine 4%, Treatment Notes Wound #3 (Left, Posterior Amputation Site - Below Knee) 1. Cleansed with: Clean wound with Normal Saline 3. Peri-wound Care: Barrier cream 4. Dressing Applied: Aquacel Ag 5. Secondary Dressing Applied Gauze  and Kerlix/Conform 7. Secured with Secretary/administrator) Signed: 08/07/2016 2:51:11 PM By: Elpidio Eric BSN, RN Entered By: Elpidio Eric on 08/07/2016 08:20:35 Tinch, [name] Kitchen ([id number]) Hanser, [name] Kitchen ([id number]) -------------------------------------------------------------------------------- Wound Assessment Details Patient Name: Richard Kyle A. Date of Service: 08/07/2016 8:00 AM Medical Record Number: [id number] Patient Account Number: [id number] Date of Birth/Sex: 08/30/1966 (50 y.o. Male) Treating RN: Clover Mealy, RN, BSN, [place] Sink Primary Care Physician: Angus Palms Other Clinician: Referring Physician: Angus Palms Treating Physician/Extender: Rudene Re in Treatment: 22 Wound Status Wound Number: 5 Primary Diabetic Wound/Ulcer of the Lower Etiology: Extremity Wound Location: Left Amputation Site - Below Knee - Medial Wound Healed - Epithelialized Status: Wounding Event: Blister  Comorbid Chronic sinus problems/congestion, Date Acquired: 07/16/2016 History: Anemia, Asthma, Sleep Apnea, Coronary Weeks Of Treatment: 3 Artery Disease, Hypertension, Myocardial Clustered Wound: No Infarction, Type II Diabetes, History of pressure wounds, Gout, Neuropathy Photos Photo Uploaded By: Elpidio Eric on 08/07/2016 13:59:23 Wound Measurements Length: (cm) 0 % Reduction in Width: (cm) 0 % Reduction in Depth: (cm) 0 Epithelializat Area: (cm) 0 Tunneling: Volume: (cm) 0 Undermining: Area: 100% Volume: 100% ion: Large (67-100%) No No Wound Description Classification: Grade 1 Foul Odor Aft Wound Margin: Flat and Intact Exudate Amount: None Present er Cleansing: No Wound Bed Granulation Amount: None Present (0%) Exposed Structure Necrotic Amount: None Present (0%) Fascia Exposed: No Fat Layer Exposed: No Tendon Exposed: No [place], [name] ([id number]) Muscle Exposed: No Joint Exposed: No Bone Exposed: No Limited to Skin Breakdown Periwound  Skin Texture Texture Color No Abnormalities Noted: No No Abnormalities Noted: No Callus: No Atrophie Blanche: No Crepitus: No Cyanosis: No Excoriation: No Ecchymosis: No Fluctuance: No Erythema: No Friable: No Hemosiderin Staining: No Induration: No Mottled: No Localized Edema: No Pallor: No Rash: No Rubor: No Scarring: No Temperature / Pain Moisture Temperature: No Abnormality No Abnormalities Noted: No Dry / Scaly: Yes Maceration: No Moist: No Wound Preparation Ulcer Cleansing: Rinsed/Irrigated with Saline Topical Anesthetic Applied: None Electronic Signature(s) Signed: 08/07/2016 2:51:11 PM By: Elpidio Eric BSN, RN Entered By: Elpidio Eric on 08/07/2016 08:23:52 [place], [name] Kitchen ([id number]) -------------------------------------------------------------------------------- Wound Assessment Details Patient Name: Richard Kyle A. Date of Service: 08/07/2016 8:00 AM Medical Record Number: [id number] Patient Account Number: [id number] Date of Birth/Sex: 1966-06-12 (50 y.o. 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 Wound Open Status: Wounding Event: Shear/Friction Comorbid Chronic sinus problems/congestion, Date Acquired: 07/20/2016 History: Anemia, Asthma, Sleep Apnea, Coronary Weeks Of Treatment: 0 Artery Disease, Hypertension, Myocardial Clustered Wound: No Infarction, Type II Diabetes, History of pressure wounds, Gout, Neuropathy Photos Photo Uploaded By: Elpidio Eric on 08/07/2016 13:59:40 Wound Measurements Length: (cm) 2 Width: (cm) 1.8 Depth: (cm) 0.2 Area: (cm) 2.827 Volume: (cm) 0.565 % Reduction in Area: % Reduction in  Volume: Epithelialization: None Tunneling: No Undermining: No Wound Description Classification: Grade 1 Wound Margin: Distinct, outline attached Exudate Amount: Large Exudate Type: Serosanguineous Exudate Color: red, brown Foul Odor After Cleansing: No Wound Bed Granulation Amount: Medium (34-66%) Exposed Structure Granulation Quality: Pink, Pale Fascia Exposed: No Dunnigan, [name] ([id number]) Necrotic Amount: Small (1-33%) Fat Layer Exposed: No Necrotic Quality: Adherent Slough Tendon Exposed: No Muscle Exposed: No Joint Exposed: No Bone Exposed: No Limited to Skin Breakdown Periwound Skin Texture Texture Color No Abnormalities Noted: No No Abnormalities Noted: No Callus: No Atrophie Blanche: No Crepitus: No Cyanosis: No Excoriation: No Ecchymosis: No Fluctuance: No Erythema: No Friable: No Hemosiderin Staining: No Induration: No Mottled: No Localized Edema: No Pallor: No Rash: No Rubor: No Scarring: No Temperature / Pain Moisture Temperature: No Abnormality No Abnormalities Noted: No Dry / Scaly: No Maceration: Yes Moist: Yes Wound Preparation Ulcer Cleansing: Rinsed/Irrigated with Saline Topical Anesthetic Applied: None Treatment Notes Wound #6 (Left, Anterior Amputation Site - Below Knee) 1. Cleansed with: Clean wound with Normal Saline 4. Dressing Applied: Medihoney Gel 5. Secondary Dressing Applied Bordered Foam Dressing 7. Secured with Secretary/administrator) Signed: 08/07/2016 2:51:11 PM By: Elpidio Eric BSN, RN Entered  By: Elpidio Eric on 08/07/2016 08:23:10 Fronczak, Berk [name] Kitchen ([id number]) -------------------------------------------------------------------------------- Vitals Details Patient Name: Richard Kyle A. Date of Service: 08/07/2016 8:00 AM Medical Record Number: [id number] Patient Account Number: [id number] Date of Birth/Sex: October 13, 1966 (50 y.o. Male) Treating RN: Huel Coventry Primary Care Physician: Angus Palms Other  Clinician: Referring Physician: Angus Palms Treating Physician/Extender: Rudene Re in Treatment: 22 Vital Signs Time Taken: 08:09 Temperature (F): 98.2 Height (in): 66 Pulse (bpm): 74 Weight (lbs): 280 Respiratory Rate (breaths/min): 18 Body Mass Index (BMI): 45.2 Blood Pressure (mmHg): 121/68 Reference Range: 80 - 120 mg / dl Electronic Signature(s) Signed: 08/08/2016 10:50:50 AM By: Elliot Gurney, RN, BSN, Kim RN, BSN Entered By: Elliot Gurney, RN, BSN, Kim on 08/07/2016 08:09:57

## 2016-08-20 DIAGNOSIS — L97919 Non-pressure chronic ulcer of unspecified part of right lower leg with unspecified severity: Secondary | ICD-10-CM | POA: Diagnosis not present

## 2016-08-28 ENCOUNTER — Encounter: Payer: PPO | Attending: General Surgery | Admitting: General Surgery

## 2016-08-28 ENCOUNTER — Encounter: Payer: Self-pay | Admitting: General Surgery

## 2016-08-28 DIAGNOSIS — L97122 Non-pressure chronic ulcer of left thigh with fat layer exposed: Secondary | ICD-10-CM | POA: Insufficient documentation

## 2016-08-28 DIAGNOSIS — I2510 Atherosclerotic heart disease of native coronary artery without angina pectoris: Secondary | ICD-10-CM | POA: Diagnosis not present

## 2016-08-28 DIAGNOSIS — L97412 Non-pressure chronic ulcer of right heel and midfoot with fat layer exposed: Secondary | ICD-10-CM | POA: Diagnosis not present

## 2016-08-28 DIAGNOSIS — I252 Old myocardial infarction: Secondary | ICD-10-CM | POA: Insufficient documentation

## 2016-08-28 DIAGNOSIS — Z89512 Acquired absence of left leg below knee: Secondary | ICD-10-CM | POA: Diagnosis not present

## 2016-08-28 DIAGNOSIS — G4730 Sleep apnea, unspecified: Secondary | ICD-10-CM | POA: Insufficient documentation

## 2016-08-28 DIAGNOSIS — E11622 Type 2 diabetes mellitus with other skin ulcer: Secondary | ICD-10-CM | POA: Insufficient documentation

## 2016-08-28 DIAGNOSIS — E1140 Type 2 diabetes mellitus with diabetic neuropathy, unspecified: Secondary | ICD-10-CM | POA: Diagnosis not present

## 2016-08-28 DIAGNOSIS — M70852 Other soft tissue disorders related to use, overuse and pressure, left thigh: Secondary | ICD-10-CM | POA: Insufficient documentation

## 2016-08-28 DIAGNOSIS — I739 Peripheral vascular disease, unspecified: Secondary | ICD-10-CM | POA: Insufficient documentation

## 2016-08-28 DIAGNOSIS — X58XXXA Exposure to other specified factors, initial encounter: Secondary | ICD-10-CM | POA: Diagnosis not present

## 2016-08-28 DIAGNOSIS — Z6841 Body Mass Index (BMI) 40.0 and over, adult: Secondary | ICD-10-CM | POA: Insufficient documentation

## 2016-08-28 DIAGNOSIS — E039 Hypothyroidism, unspecified: Secondary | ICD-10-CM | POA: Diagnosis not present

## 2016-08-28 DIAGNOSIS — E1121 Type 2 diabetes mellitus with diabetic nephropathy: Secondary | ICD-10-CM | POA: Insufficient documentation

## 2016-08-28 DIAGNOSIS — I10 Essential (primary) hypertension: Secondary | ICD-10-CM | POA: Insufficient documentation

## 2016-08-28 DIAGNOSIS — S81002A Unspecified open wound, left knee, initial encounter: Secondary | ICD-10-CM | POA: Diagnosis not present

## 2016-08-28 DIAGNOSIS — L97919 Non-pressure chronic ulcer of unspecified part of right lower leg with unspecified severity: Secondary | ICD-10-CM | POA: Diagnosis not present

## 2016-08-28 NOTE — Progress Notes (Signed)
See I heal 

## 2016-08-28 NOTE — Progress Notes (Addendum)
KERMIT, ARNETTE ([id number]) Visit Report for 08/28/2016 Chief Complaint Document Details Patient Name: Richard [name], Richard [name]. Date of Service: 08/28/2016 8:00 AM Medical Record Number: [id number] Patient Account Number: [id number] Date of Birth/Sex: Apr 04, 1966 (50 y.o. Male) Treating RN: Primary Care Physician: Angus Palms Other Clinician: Referring Physician: Angus Palms Treating Physician/Extender: Elayne Snare in Treatment: 25 Information Obtained from: Patient Chief Complaint Patients presents for treatment of an open diabetic ulcer and pressure injury to his left lower thigh posteriorly for about [name] year. he is back after her recent consultation at [facility] outpatient prosthetic center Electronic Signature(s) Signed: 08/28/2016 3:41:47 PM By: Ardath Sax MD Previous Signature: 08/28/2016 8:07:54 AM Version By: Ardath Sax MD Entered By: Ardath Sax on 08/28/2016 09:22:31 Richard [name], Richard [name] Kitchen ([id number]) -------------------------------------------------------------------------------- HPI Details Patient Name: Richard [name] [name]. Date of Service: 08/28/2016 8:00 AM Medical Record Number: [id number] Patient Account Number: [id number] Date of Birth/Sex: May 30, 1966 (50 y.o. Male) Treating RN: Primary Care Physician: Angus Palms Other Clinician: Referring Physician: Angus Palms Treating Physician/Extender: Elayne Snare in Treatment: 25 History of Present Illness Location: left lower thigh posteriorly Quality: Patient reports experiencing [name] dull pain to affected area(s). Severity: Patient states wound are getting worse. Duration: Patient has had the wound for > 11 months prior to seeking treatment at the wound center Timing: Pain in wound is Intermittent (comes and goes Context: The wound occurred when the patient has been wearing [name] prosthesis on his left lower extremity for [name] left BKA Modifying Factors: Other treatment(s) tried include:working  with the prosthetic and orthopedic department at [facility] Associated Signs and Symptoms: Patient reports having foul odor. HPI Description: 50 year old gentleman with past medical history of diabetes mellitus with last hemoglobin A1c done in February 2016 was 8.7, hypertension, hypothyroidism, coronary artery disease, ischemic cardiomyopathy , nephropathy, obesity, peripheral neuropathy, peripheral vascular disease and sleep apnea. 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 He has had problems with his left BKA prosthesis for [name] long while and had [name] vacuum system and then had to be changed over to [name] sleeve system and has been working with the orthopedic and prosthetic department at [facility]. He has also had [name] plastic surgery opinion in the past. The ulceration on the posterior part of his lower thigh continued to be aggravating an enlarging and have [name] foul order and drainage. 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 Lubertha Basque the PA whonoted that he continue to wear his prosthesis against medical advice. details from his past surgical history notes that he's had [name] cardiac catheterization, coronary angioplasty, amputation of the left leg in 2012, debridement of skin and subcutis tissue in June 2013, application of wound VAC at that time, below knee amputation revision in April 2014. Recommendation was for [name] new socket to be made which should be above the new wound. He also recommended surgical revision of the redundant skin and to stay off the prosthesis to the wound heals but the patient would not be able to do this because of work. Hence he was advised to establish  with [name] local wound clinic for management of his current wound and follow-up with the  amputee clinic at [facility] or with Dr. Mindi Curling. If he decided to go back to them for surgery he should see Dr. Lanier Ensign. 03/27/2016 -- he was unable to get his prosthesis made at the place where they had begun to mold and hence he has to find himself [name] mother prosthetic department and is working on this. 04/10/2016 -- on his left popliteal fossa area he has got [name] small satellite abrasion with an ulceration which was caused by pulling off the socket on his skin. He still has to see his prosthetic department. DRAEDYN, WEIDINGER ([id number]) 05/01/2016 -- today he comes with [name] new problem on his right foot which he says he's had for about 2 months and has been treated by his podiatrist Dr. Ovid Curd. Says due to the fact that his footwear and insoles were worn out, he has developed an ulcerated area which continues to drain sero- sanguinous fluid and has been on doxycycline for about 2 months. He saw Dr. Ardelle Anton who did an x-ray of the foot and has been following up with him for [name] long while regarding this and managing his Charcot's arthropathy right foot. Today the patient wanted me to take [name] look at it and agrees to undergo treatment for this. 05/08/2016 -- I have reviewed notes from Dr. Vaughan Sine who saw him on 05/06/2016. after review he asked him to continue with local wound care and started him on ciprofloxacin as well as his doxycycline. note is made of the fact that on his previous visit on 02/01/2016 x-rays were done which showed chronic arthritic changes present in the rear foot due to Charcot deformity. Vessel occification is present. 06/12/2016 -- the patient has not yet got his left below-knee prostheses sleeve which was to be made afresh. He has also not received his right foot orthotic shoe which was to be made at Dr. Kenna Gilbert office. It has been over 2 months since he keeps telling me that he is  going to get this done soon. 07/17/2016 -- shunt has not been to see as in 5 weeks and continues to have issues trying to get his prosthesis for his left below-knee amputation. He has got new superficial abrasion on the medial part of his left knee and this is separate from the large ulceration in the popliteal fossa. Did get his inserts for his diabetic shoe for his right foot and this wound is healed. 08/07/2016 -- patient comes after 3 weeks and has [name] new wound on his left knee just over the patella from [name] new prosthesis which she tried for about [name] week. He is back to wearing his old prostheses at present, which he will use still he gets the new one back. Electronic Signature(s) Signed: 08/28/2016 3:41:47 PM By: Ardath Sax MD Previous Signature: 08/28/2016 8:09:45 AM Version By: Ardath Sax MD Entered By: Ardath Sax on 08/28/2016 09:22:41 Richard [name], Richard [name] Kitchen ([id number]) -------------------------------------------------------------------------------- Gaynelle Adu TISS Details Patient Name: Richard [name] [name]. Date of Service: 08/28/2016 8:00 AM Medical Record Number: [id number] Patient Account Number: [id number] Date of Birth/Sex: 06-28-1966 (50 y.o. Male) Treating RN: Huel Coventry Primary Care Physician: Angus Palms Other Clinician: Referring Physician: Angus Palms Treating Physician/Extender: Elayne Snare in Treatment: 25 Procedure Performed for: Wound #6 Left,Anterior Amputation Site - Below Knee Performed By: Physician Ardath Sax, MD Post Procedure Diagnosis Same as Pre-procedure Electronic Signature(s) Signed: 08/28/2016 9:45:13 AM By: Elliot Gurney RN, BSN, Kim RN, BSN Entered By: Elliot Gurney, RN, BSN, Kim on 08/28/2016 08:21:52 Richard [name], Richard [name]. (  [id number]) -------------------------------------------------------------------------------- Physical Exam Details Patient Name: Fraga, Cordney [name]. Date of Service: 08/28/2016 8:00 AM Medical Record Number:  [id number] Patient Account Number: [id number] Date of Birth/Sex: [birth year] (50 y.o. Male) Treating RN: Primary Care Physician: Angus Palms Other Clinician: Referring Physician: Angus Palms Treating Physician/Extender: Ardath Sax Weeks in Treatment: 25 Notes Pressure ulcer from prothesis. Treating with mediplex dressings daily Electronic Signature(s) Signed: 08/28/2016 3:41:47 PM By: Ardath Sax MD Previous Signature: 08/28/2016 8:10:01 AM Version By: Ardath Sax MD Entered By: Ardath Sax on 08/28/2016 09:24:41 Considine, Jens Som ([id number]) -------------------------------------------------------------------------------- Physician Orders Details Patient Name: Richard [name] [name]. Date of Service: 08/28/2016 8:00 AM Medical Record Number: [id number] Patient Account Number: [id number] Date of Birth/Sex: 12-May-1966 (49 y.o. 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 Wound #6 Left,Anterior Amputation Site - Below Knee o Clean wound with Normal Saline. Wound #7 Left Amputation Site - Below Knee o Clean wound with Normal Saline. Skin  Barriers/Peri-Wound Care Wound #3 Left,Posterior Amputation Site - Below Knee o Barrier cream Wound #6 Left,Anterior Amputation Site - Below Knee o Barrier cream Wound #7 Left Amputation Site - Below Knee o Barrier cream Primary Wound Dressing Wound #3 Left,Posterior Amputation Site - Below Knee o Other: - Mediplex Ag or Equivalent as approved and covered by insurance Wound #6 Left,Anterior Amputation Site - Below Knee Richard [name], Richard [name]. ([id number]) o Other: - Mediplex Ag or Equivalent as approved and covered by insurance Wound #7 Left Amputation Site - Below Knee o Other: - Mediplex Ag or Equivalent as approved and covered by insurance Secondary Dressing Wound #3 Left,Posterior Amputation Site - Below Knee o Gauze and Kerlix/Conform Wound #6 Left,Anterior Amputation Site - Below Knee o Boardered Foam Dressing Wound #7 Left Amputation Site - Below Knee o Boardered Foam Dressing Dressing Change Frequency Wound #3 Left,Posterior Amputation Site - Below Knee o Change dressing every day. - Daily Dressing change due to drainage Wound #6 Left,Anterior Amputation Site - Below Knee o Change dressing every day. - Daily Dressing change due to drainage Wound #7 Left Amputation Site - Below Knee o Change dressing every day. - Daily Dressing change due to drainage Follow-up Appointments Wound #3 Left,Posterior Amputation Site - Below Knee o Return Appointment in 1 week. Wound #6 Left,Anterior Amputation Site - Below Knee o Return Appointment in 1 week. Wound #7 Left Amputation Site - Below Knee o Return Appointment in 1 week. Electronic Signature(s) Signed: 08/28/2016 9:45:13 AM By: Elliot Gurney RN, BSN, Kim RN, BSN Signed: 08/28/2016 3:41:47 PM By: Ardath Sax MD Entered By: Elliot Gurney RN, BSN, Kim on 08/28/2016 08:29:40 Richard [name], Richard [name] ([id number]) -------------------------------------------------------------------------------- Prescription 08/28/2016 Patient  Name: Richard [name] Physician: Ardath Sax MD Date of Birth: Apr 03, 1966 NPI#: [id number] Sex: M DEA#: Phone #: [phone] License #: Patient Address: [facility] [address] [place], [place] [id number] [facility] [address], [address] [place], [place] [address] [phone] Allergies amitriptyline Reaction: nausea Severity: Severe Pravachol Reaction: muscles ache and elevated CK Severity: Severe Septra Reaction: mouth ulcers Severity: Severe ACE Inhibitors Reaction: increases creatinine Severity: Severe lovastatin Reaction: myalgia Severity: Severe adhesive Reaction: rash Severity: Severe Iodinated Contrast Media - Oral and IV Dye Reaction: kidney disorder Severity: Severe Iodice, Zakiah [name]. ([id number]) Tree Nuts Reaction: anaphalaxis Severity: Severe Physician's Orders Other: - Mediplex Ag or Equivalent as approved and covered by insurance Signature(s): Date(s): Electronic Signature(s) Signed: 08/28/2016 3:41:47 PM By: Ardath SaxParker, Derricka Mertz MD Entered By: Ardath SaxParker, Pj Zehner on 08/28/2016 09:28:49 Richard [name], Richard [name] Kitchen. ([id number]) --------------------------------------------------------------------------------  Problem List Details Patient Name: Richard [name], Tharon [name]. Date of Service: 08/28/2016 8:00 AM Medical Record Number: [id number] Patient Account Number: [id number] Date of Birth/Sex: [birth year] [age] y.o. 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 with fat layer 03/06/2016 Yes exposed M70.852 Other soft tissue disorders related to use, overuse and 03/06/2016 Yes pressure, left  thigh E66.01 Morbid (severe) obesity due to excess calories 03/06/2016 Yes L97.412 Non-pressure chronic ulcer of right heel and midfoot with 05/01/2016 Yes fat layer exposed M14.671 Charcot's joint, right ankle and foot 05/01/2016 Yes S81.002A Unspecified open wound, left knee, initial encounter 08/07/2016 Yes Inactive Problems Resolved Problems Electronic Signature(s) Signed: 08/28/2016 3:41:47 PM By: Ardath SaxParker, Shandiin Eisenbeis MD Previous Signature: 08/28/2016 8:07:33 AM Version By: Ardath SaxParker, Kamali Nephew MD Entered By: Ardath SaxParker, Kyliyah Stirn on 08/28/2016 09:22:19 Borman, Alcee [name]. ([id number]) Powley, Kelsie [name] Kitchen. ([id number]) -------------------------------------------------------------------------------- Progress Note Details Patient Name: Richard [name], [place] [name]. Date of Service: 08/28/2016 8:00 AM Medical Record Number: [id number] Patient Account Number: [id number] Date of Birth/Sex: [birth year] [age] y.o. Male) Treating RN: Huel CoventryWoody, Kim Primary Care Physician: Angus PalmsGEORGE, SIONNE Other Clinician: Referring Physician: Angus PalmsGEORGE, SIONNE Treating Physician/Extender: Elayne SnarePARKER, Paitynn Mikus Weeks in Treatment: 25 Subjective Chief Complaint Information obtained from Patient Patients presents for treatment of an open diabetic ulcer and pressure injury to his left lower thigh posteriorly for about [name] year. he is back after [facility] History of Present Illness (HPI) The following HPI elements were documented for the patient's wound: Location: left lower thigh posteriorly Quality: Patient reports experiencing [name] dull pain to affected area(s). Severity: Patient states wound are getting worse. Duration: Patient has had the wound for > 11 months prior to seeking treatment at the wound center Timing: Pain in wound is Intermittent (comes and goes Context: The wound occurred when the patient has been wearing [name] prosthesis on his left lower extremity for [name] left BKA Modifying Factors: Other  treatment(s) tried include:[facility] Associated Signs and Symptoms: Patient reports having foul odor. 50 year old gentleman with past medical history of diabetes mellitus with last hemoglobin A1c done in February 2016 was 8.7, hypertension, hypothyroidism, coronary artery disease, ischemic cardiomyopathy , nephropathy, obesity, peripheral neuropathy, peripheral vascular disease and sleep apnea. 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 He has had problems with his left BKA prosthesis for [name] long while and had [name] vacuum system and then had to be changed  over to [name] sleeve system and has been working with the orthopedic and prosthetic department at [facility]. He has also had [name] plastic surgery opinion in the past. The ulceration on the posterior part of his lower thigh continued to be aggravating an enlarging and have [name] foul order and drainage. 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 Lubertha Basque the PA whonoted that he continue to wear his prosthesis against medical advice. details from his past surgical history notes that he's had [name] cardiac catheterization, coronary angioplasty, amputation of the left leg in 2012, debridement of skin and subcutis tissue in June 2013, application of wound VAC at that time, below knee amputation revision in April 2014. Gunawan, Sekou [name]. ([id number]) Recommendation was for [name] new socket to be made which should be above the new wound. He also recommended surgical revision of the redundant skin and to stay off the prosthesis to the wound heals but the patient would not be able to do this  because of work. Hence he was advised to establish with [name] local wound clinic for management of his current wound and follow-up with the amputee clinic at [facility] or with Dr. Mindi Curling. If he decided to go back to them for surgery he should see Dr. Lanier Ensign. 03/27/2016 -- he was unable to get his prosthesis made at the place where they had begun to mold and hence he has to find himself [name] mother prosthetic department and is working on this. 04/10/2016 -- on his left popliteal fossa area he has got [name] small satellite abrasion with an ulceration which was caused by pulling off the socket on his skin. He still has to see his prosthetic department. 05/01/2016 -- today he comes with [name] new problem on his right foot which he says he's had for about 2 months and has been treated by his podiatrist Dr. Ovid Curd. Says due to the fact that his footwear and insoles were worn out, he has developed an ulcerated area which continues to drain sero- sanguinous fluid and has been on doxycycline for about 2 months. He saw Dr. Ardelle Anton who did an x-ray of the foot and has been following up with him for [name] long while regarding this and managing his Charcot's arthropathy right foot. Today the patient wanted me to take [name] look at it and agrees to undergo treatment for this. 05/08/2016 -- I have reviewed notes from Dr. Vaughan Sine who saw him on 05/06/2016. after review he asked him to continue with local wound care and started him on ciprofloxacin as well as his doxycycline. note is made of the fact that on his previous visit on 02/01/2016 x-rays were done which showed chronic arthritic changes present in the rear foot due to Charcot deformity. Vessel occification is present. 06/12/2016 -- the patient has not yet got his left below-knee prostheses sleeve which was to be made afresh. He has also not received his right foot orthotic shoe which was to be made at Dr. Kenna Gilbert office. It has been over 2 months since he keeps  telling me that he is going to get this done soon. 07/17/2016 -- shunt has not been to see as in 5 weeks and continues to have issues trying to get his prosthesis for his left below-knee amputation. He has got new  superficial abrasion on the medial part of his left knee and this is separate from the large ulceration in the popliteal fossa. Did get his inserts for his diabetic shoe for his right foot and this wound is healed. 08/07/2016 -- patient comes after 3 weeks and has [name] new wound on his left knee just over the patella from [name] new prosthesis which she tried for about [name] week. He is back to wearing his old prostheses at present, which he will use still he gets the new one back. Objective Constitutional Vitals Time Taken: 8:08 AM, Height: 66 in, Weight: 280 lbs, BMI: 45.2, Temperature: 97.9 F, Pulse: 69 bpm, Respiratory Rate: 16 breaths/min, Blood Pressure: 148/89 mmHg. Integumentary (Hair, Skin) Wound #3 status is Open. Original cause of wound was Pressure Injury. The wound is located on the Left,Posterior Amputation Site - Below Knee. The wound measures 4.5cm length x 8cm width x 0.2cm depth; 28.274cm^2 area and 5.655cm^3 volume. The wound is limited to skin breakdown. There is no tunneling or undermining noted. There is [name] large amount of purulent drainage noted. The wound margin is thickened. There is large (67-100%) pink granulation within the wound bed. There is [name] small (1-33%) Hardgrave, Branon [name]. ([id number]) amount of necrotic tissue within the wound bed including Adherent Slough. The periwound skin appearance exhibited: Scarring, Maceration, Moist. The periwound skin appearance did not exhibit: Callus, Crepitus, Excoriation, Fluctuance, Friable, Induration, Localized Edema, Rash, Dry/Scaly, Atrophie Blanche, Cyanosis, Ecchymosis, Hemosiderin Staining, Mottled, Pallor, Rubor, Erythema. Periwound temperature was noted as No Abnormality. The periwound has tenderness on palpation. Wound  #6 status is Open. Original cause of wound was Shear/Friction. The wound is located on the Left,Anterior Amputation Site - Below Knee. The wound measures 0.5cm length x 0.6cm width x 0.8cm depth; 0.236cm^2 area and 0.188cm^3 volume. The wound is limited to skin breakdown. There is no tunneling noted, however, there is undermining starting at 7:00 and ending at 2:00 with [name] maximum distance of 2.2cm. There is [name] large amount of serous drainage noted. The wound margin is distinct with the outline attached to the wound base. There is medium (34-66%) pink, pale granulation within the wound bed. There is [name] small (1-33%) amount of necrotic tissue within the wound bed including Adherent Slough. The periwound skin appearance exhibited: Maceration, Moist. The periwound skin appearance did not exhibit: Callus, Crepitus, Excoriation, Fluctuance, Friable, Induration, Localized Edema, Rash, Scarring, Dry/Scaly, Atrophie Blanche, Cyanosis, Ecchymosis, Hemosiderin Staining, Mottled, Pallor, Rubor, Erythema. Periwound temperature was noted as No Abnormality. Wound #7 status is Open. Original cause of wound was Gradually Appeared. The wound is located on the Left Amputation Site - Below Knee. The wound measures 0.6cm length x 0.5cm width x 0.8cm depth; 0.236cm^2 area and 0.188cm^3 volume. The wound is limited to skin breakdown. There is no tunneling or undermining noted. There is [name] large amount of serous drainage noted. The wound margin is flat and intact. There is no granulation within the wound bed. There is [name] small (1-33%) amount of necrotic tissue within the wound bed including Adherent Slough. The periwound skin appearance exhibited: Maceration. The periwound skin appearance did not exhibit: Callus, Crepitus, Excoriation, Fluctuance, Friable, Induration, Localized Edema, Rash, Scarring, Dry/Scaly, Moist, Atrophie Blanche, Cyanosis, Ecchymosis, Hemosiderin Staining, Mottled, Pallor, Rubor, Erythema. large ulcer  posterior thigh that we are treating with mediplex and trying to offload, Patient not able to offload. 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 ([id number]) Procedures Wound #6 Wound #6 is [name] Diabetic Wound/Ulcer of the Lower Extremity located on the Left,Anterior Amputation Site - Below Knee . An CHEM CAUT GRANULATION TISS procedure was performed by Ardath Sax, MD. Post procedure Diagnosis Wound #6: Same as Pre-Procedure Plan Wound Cleansing: Wound #3 Left,Posterior Amputation Site - Below Knee: Clean wound with Normal Saline. Wound #6 Left,Anterior Amputation Site - Below Knee: Clean wound with Normal Saline. Wound #7 Left Amputation Site - Below Knee: Clean wound with Normal Saline. Skin Barriers/Peri-Wound Care: Wound #3 Left,Posterior Amputation Site - Below Knee: Barrier cream Wound #6 Left,Anterior Amputation Site - Below Knee: Barrier cream Wound #7 Left Amputation Site - Below Knee: Barrier cream Primary Wound Dressing: Wound #3 Left,Posterior Amputation Site - Below Knee: Other: - Mediplex Ag or Equivalent as approved and covered by insurance Wound #6 Left,Anterior Amputation Site - Below Knee: Other: - Mediplex Ag or Equivalent as approved and covered by insurance Wound #7 Left Amputation Site - Below Knee: Other: - Mediplex Ag or Equivalent as approved and covered by insurance Secondary Dressing: Wound #3 Left,Posterior Amputation Site - Below Knee: Gauze and Kerlix/Conform Wound #6  Left,Anterior Amputation Site - Below Knee: Boardered Foam Dressing Wound #7 Left Amputation Site - Below Knee: Boardered Foam Dressing Dressing Change Frequency: Wound #3 Left,Posterior Amputation Site - Below Knee: Change dressing every day. - Daily Dressing change due to drainage Wound #6 Left,Anterior Amputation Site - Below Knee: Change dressing every day. - Daily Dressing change due to drainage Simerly, Amario [name]. ([id number]) Wound #7 Left Amputation Site - Below Knee: Change dressing every day. - Daily Dressing change due to drainage Follow-up Appointments: Wound #3 Left,Posterior Amputation Site - Below Knee: Return Appointment in 1 week. Wound #6 Left,Anterior Amputation Site - Below Knee: Return Appointment in 1 week. Wound #7 Left Amputation Site - Below Knee: Return Appointment in 1 week. Follow-Up Appointments: [name] follow-up appointment should be scheduled. Medication Reconciliation completed and provided to Patient/Care Provider. [name] Patient Clinical Summary of Care was provided to [facility] Electronic Signature(s) Signed: 08/28/2016 3:41:47 PM By: Ardath Sax MD Entered By: Ardath Sax on 08/28/2016 09:27:32 Ismael, Josealfredo [name] Kitchen ([id number]) -------------------------------------------------------------------------------- SuperBill Details Patient Name: Richard [name] [name]. Date of Service: 08/28/2016 Medical Record Number: [id number] Patient Account Number: [id number] Date of Birth/Sex: 1965/12/13 (50 y.o. 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 M14.671 Charcot's joint, right ankle and foot S81.002A Unspecified open wound, left knee, initial encounter Facility Procedures CPT4 Code: 62952841 Description: 17250 - CHEM CAUT GRANULATION TISS ICD-10 Description Diagnosis E11.622 Type 2 diabetes mellitus with other skin ulcer Modifier: Quantity: 1 Physician Procedures CPT4 Code Description: 3244010 99213 - WC PHYS LEVEL 3 - EST PT ICD-10  Description Diagnosis M70.852 Other soft tissue disorders related to use, overuse an Modifier: d pressure, Quantity: 1 left thigh CPT4 Code Description: 1610960 17250 - WC PHYS CHEM CAUT GRAN TISSUE ICD-10 Description Diagnosis E11.622 Type 2 diabetes mellitus with other skin ulcer Modifier: Quantity: 1 Electronic Signature(s) Signed: 08/28/2016 3:41:47 PM By: Ardath Sax MD Entered By: Ardath Sax on 08/28/2016 09:28:43

## 2016-08-29 NOTE — Progress Notes (Addendum)
KORBIN, MAPPS ([id number]) Visit Report for 08/28/2016 Arrival Information Details Patient Name: [name], [name] Date of Service: 08/28/2016 8:00 AM Medical Record Number: [id number] Patient Account Number: [id number] Date of Birth/Sex: [birth year] (50 y.o. Male) Treating RN: Huel Coventry Primary Care Physician: Angus Palms Other Clinician: Referring Physician: Angus Palms Treating Physician/Extender: Elayne Snare in Treatment: 25 Visit Information History Since Last Visit Added or deleted any medications: No Patient Arrived: Ambulatory Any new allergies or adverse reactions: No Arrival Time: 08:06 Had a fall or experienced change in No Accompanied By: self activities of daily living that may affect Transfer Assistance: None risk of falls: Patient Identification Verified: Yes Signs or symptoms of abuse/neglect since last No Secondary Verification Process Yes visito Completed: Hospitalized since last visit: No Patient Requires Transmission-Based No Has Dressing in Place as Prescribed: Yes Precautions: Has Compression in Place as Prescribed: Yes Patient Has Alerts: No Pain Present Now: No Electronic Signature(s) Signed: 08/28/2016 9:45:13 AM By: Elliot Gurney, RN, BSN, Kim RN, BSN Entered By: Elliot Gurney, RN, BSN, Kim on 08/28/2016 08:06:37 General, Jens Som ([id number]) -------------------------------------------------------------------------------- Encounter Discharge Information Details Patient Name: Richard Kyle A. Date of Service: 08/28/2016 8:00 AM Medical Record Number: [id number] Patient Account Number: [id number] Date of Birth/Sex: December 02, 1965 (50 y.o. Male) Treating RN: Huel Coventry Primary Care Physician: Angus Palms Other Clinician: Referring Physician: Angus Palms Treating Physician/Extender: Elayne Snare in Treatment: 25 Encounter Discharge Information Items Discharge Pain Level: 10 Discharge Condition: Stable Ambulatory Status:  Ambulatory Discharge Destination: Home Transportation: Private Auto Accompanied By: self Schedule Follow-up Appointment: Yes Medication Reconciliation completed and provided to Patient/Care Yes Kambrey Hagger: Provided on Clinical Summary of Care: 08/28/2016 Form Type Recipient Paper Patient DP Electronic Signature(s) Signed: 08/28/2016 3:41:47 PM By: Ardath Sax MD Previous Signature: 08/28/2016 8:41:22 AM Version By: Gwenlyn Perking Entered By: Ardath Sax on 08/28/2016 09:29:08 Zuba, Cyrus AMarland Kitchen ([id number]) -------------------------------------------------------------------------------- Lower Extremity Assessment Details Patient Name: Richard Kyle A. Date of Service: 08/28/2016 8:00 AM Medical Record Number: [id number] Patient Account Number: [id number] Date of Birth/Sex: 30-Jun-1966 (50 y.o. Male) Treating RN: Huel Coventry Primary Care Physician: Angus Palms Other Clinician: Referring Physician: Angus Palms Treating Physician/Extender: Elayne Snare in Treatment: 25 Electronic Signature(s) Signed: 08/28/2016 9:45:13 AM By: Elliot Gurney, RN, BSN, Kim RN, BSN Entered By: Elliot Gurney, RN, BSN, Kim on 08/28/2016 08:09:47 Burkley, Jens Som ([id number]) -------------------------------------------------------------------------------- Multi Wound Chart Details Patient Name: Richard Kyle A. Date of Service: 08/28/2016 8:00 AM Medical Record Number: [id number] Patient Account Number: [id number] Date of Birth/Sex: [birth year] (50 y.o. Male) Treating RN: Huel Coventry Primary Care Physician: Angus Palms Other Clinician: Referring Physician: Angus Palms Treating Physician/Extender: Elayne Snare in Treatment: 25 Vital Signs Height(in): 66 Pulse(bpm): 69 Weight(lbs): 280 Blood Pressure 148/89 (mmHg): Body Mass Index(BMI): 45 Temperature(F): 97.9 Respiratory Rate 16 (breaths/min): Photos: [N/A:N/A] Wound Location: Left Amputation Site - Left Amputation Site -  N/A Below Knee - Posterior Below Knee - Anterior Wounding Event: Pressure Injury Shear/Friction N/A Primary Etiology: Diabetic Wound/Ulcer of Diabetic Wound/Ulcer of N/A the Lower Extremity the Lower Extremity Comorbid History: Chronic sinus Chronic sinus N/A problems/congestion, problems/congestion, Anemia, Asthma, Sleep Anemia, Asthma, Sleep Apnea, Coronary Artery Apnea, Coronary Artery Disease, Hypertension, Disease, Hypertension, Myocardial Infarction, Myocardial Infarction, Type II Diabetes, History Type II Diabetes, History of pressure wounds, Gout, of pressure wounds, Gout, Neuropathy Neuropathy Date Acquired: 06/26/2015 07/20/2016 N/A Weeks of Treatment: 25 3 N/A Wound Status: Open Open N/A Measurements L x W x D 4.5x8x0.2 0.5x0.6x0.8 N/A (cm) Area (cm) : 28.274 0.236 N/A Volume (cm) :  5.655 0.188 N/A % Reduction in Area: 0.00% 91.70% N/A % Reduction in Volume: 0.00% 66.70% N/A Starting Position 1 7 (o'clock): Ruelas, Tavien A. ([id number]) Ending Position 1 2 (o'clock): Maximum Distance 1 2.2 (cm): Undermining: No Yes N/A Classification: Grade 2 Grade 2 N/A Exudate Amount: Large Large N/A Exudate Type: Purulent Serous N/A Exudate Color: yellow, brown, green amber N/A Foul Odor After Yes No N/A Cleansing: Odor Anticipated Due to No N/A N/A Product Use: Wound Margin: Thickened Distinct, outline attached N/A Granulation Amount: Large (67-100%) Medium (34-66%) N/A Granulation Quality: Pink Pink, Pale N/A Necrotic Amount: Small (1-33%) Small (1-33%) N/A Exposed Structures: Fascia: No Fascia: No N/A Fat: No Fat: No Tendon: No Tendon: No Muscle: No Muscle: No Joint: No Joint: No Bone: No Bone: No Limited to Skin Limited to Skin Breakdown Breakdown Epithelialization: None None N/A Periwound Skin Texture: Scarring: Yes Edema: No N/A Edema: No Excoriation: No Excoriation: No Induration: No Induration: No Callus: No Callus: No Crepitus: No Crepitus:  No Fluctuance: No Fluctuance: No Friable: No Friable: No Rash: No Rash: No Scarring: No Periwound Skin Maceration: Yes Maceration: Yes N/A Moisture: Moist: Yes Moist: Yes Dry/Scaly: No Dry/Scaly: No Periwound Skin Color: Atrophie Blanche: No Atrophie Blanche: No N/A Cyanosis: No Cyanosis: No Ecchymosis: No Ecchymosis: No Erythema: No Erythema: No Hemosiderin Staining: No Hemosiderin Staining: No Mottled: No Mottled: No Pallor: No Pallor: No Rubor: No Rubor: No Temperature: No Abnormality No Abnormality N/A Tenderness on Yes No N/A Palpation: Wound Preparation: N/A Pierpoint, Darick A. ([id number]) Ulcer Cleansing: Ulcer Cleansing: Rinsed/Irrigated with Rinsed/Irrigated with Saline Saline Topical Anesthetic Topical Anesthetic Applied: Other: lidocaine Applied: None 4% Treatment Notes Electronic Signature(s) Signed: 08/28/2016 9:45:13 AM By: Elliot Gurney, RN, BSN, Kim RN, BSN Entered By: Elliot Gurney, RN, BSN, Kim on 08/28/2016 78:29:56 Idamae Schuller ([id number]) -------------------------------------------------------------------------------- Multi-Disciplinary Care Plan Details Patient Name: Richard Kyle A. Date of Service: 08/28/2016 8:00 AM Medical Record Number: [id number] Patient Account Number: [id number] Date of Birth/Sex: Dec 18, 1965 (50 y.o. Male) Treating RN: Huel Coventry Primary Care Physician: Angus Palms Other Clinician: Referring Physician: Angus Palms Treating Physician/Extender: Elayne Snare in Treatment: 25 Active Inactive Electronic Signature(s) Signed: 10/14/2016 4:28:14 PM By: Elliot Gurney RN, BSN, Kim RN, BSN Previous Signature: 08/28/2016 9:45:13 AM Version By: Elliot Gurney, RN, BSN, Kim RN, BSN Entered By: Elliot Gurney, RN, BSN, Kim on 10/14/2016 16:28:13 Berhe, Nilton AMarland Kitchen ([id number]) -------------------------------------------------------------------------------- Pain Assessment Details Patient Name: Richard Kyle A. Date of Service:  08/28/2016 8:00 AM Medical Record Number: [id number] Patient Account Number: [id number] Date of Birth/Sex: May 31, 1966 (49 y.o. Male) Treating RN: Huel Coventry Primary Care Physician: Angus Palms Other Clinician: Referring Physician: Angus Palms Treating Physician/Extender: Elayne Snare in Treatment: 25 Active Problems Location of Pain Severity and Description of Pain Patient Has Paino Yes Site Locations Pain Location: Pain in Ulcers With Dressing Change: Yes Rate the pain. Current Pain Level: 10 Pain Management and Medication Current Pain Management: Goals for Pain Management Topical or injectable lidocaine is offered to patient for acute pain when surgical debridement is performed. If needed, Patient is instructed to use over the counter pain medication for the following 24-48 hours after debridement. Wound care MDs do not prescribed pain medications. Patient has chronic pain or uncontrolled pain. Patient has been instructed to make an appointment with their Primary Care Physician for pain management. Electronic Signature(s) Signed: 08/28/2016 9:45:13 AM By: Elliot Gurney, RN, BSN, Kim RN, BSN Entered By: Elliot Gurney, RN, BSN, Kim on 08/28/2016 08:07:20 Roh, Jens Som ([id number]) -------------------------------------------------------------------------------- Patient/Caregiver Education Details Patient Name: LONZO,  Jarren A. Date of Service: 08/28/2016 8:00 AM Medical Record Number: [id number] Patient Account Number: [id number] Date of Birth/Gender: 19-Jul-1966 (50 y.o. Male) Treating RN: Huel Coventry Primary Care Physician: Angus Palms Other Clinician: Referring Physician: Angus Palms Treating Physician/Extender: Elayne Snare in Treatment: 25 Education Assessment Education Provided To: Patient Education Topics Provided Wound/Skin Impairment: Handouts: Caring for Your Ulcer Methods: Demonstration Responses: State content correctly Electronic  Signature(s) Signed: 08/28/2016 3:41:47 PM By: Ardath Sax MD Entered By: Ardath Sax on 08/28/2016 09:29:16 Riesen, Maurilio AMarland Kitchen ([id number]) -------------------------------------------------------------------------------- Wound Assessment Details Patient Name: Richard Kyle A. Date of Service: 08/28/2016 8:00 AM Medical Record Number: [id number] Patient Account Number: [id number] Date of Birth/Sex: [birth year] (50 y.o. Male) Treating RN: Huel Coventry Primary Care Physician: Angus Palms Other Clinician: Referring Physician: Angus Palms Treating Physician/Extender: Elayne Snare in Treatment: 25 Wound Status Wound Number: 3 Primary Diabetic Wound/Ulcer of the Lower Etiology: Extremity Wound Location: Left Amputation Site - Below Knee - Posterior Wound Open Status: Wounding Event: Pressure Injury Comorbid Chronic sinus problems/congestion, Date Acquired: 06/26/2015 History: Anemia, Asthma, Sleep Apnea, Coronary Weeks Of Treatment: 25 Artery Disease, Hypertension, Myocardial Clustered Wound: No Infarction, Type II Diabetes, History of pressure wounds, Gout, Neuropathy Photos Wound Measurements Length: (cm) 4.5 Width: (cm) 8 Depth: (cm) 0.2 Area: (cm) 28.274 Volume: (cm) 5.655 % Reduction in Area: 0% % Reduction in Volume: 0% Epithelialization: None Tunneling: No Undermining: No Wound Description Classification: Grade 2 Foul Odor Afte Wound Margin: Thickened Due to Product Exudate Amount: Large Exudate Type: Purulent Exudate Color: yellow, brown, green r Cleansing: Yes Use: No Wound Bed Granulation Amount: Large (67-100%) Exposed Structure Granulation Quality: Pink Fascia Exposed: No Necrotic Amount: Small (1-33%) Fat Layer Exposed: No Necrotic Quality: Adherent Slough Tendon Exposed: No Muscle Exposed: No Wilms, Ezariah A. ([id number]) Joint Exposed: No Bone Exposed: No Limited to Skin Breakdown Periwound Skin Texture Texture Color No  Abnormalities Noted: No No Abnormalities Noted: No Callus: No Atrophie Blanche: No Crepitus: No Cyanosis: No Excoriation: No Ecchymosis: No Fluctuance: No Erythema: No Friable: No Hemosiderin Staining: No Induration: No Mottled: No Localized Edema: No Pallor: No Rash: No Rubor: No Scarring: Yes Temperature / Pain Moisture Temperature: No Abnormality No Abnormalities Noted: No Tenderness on Palpation: Yes Dry / Scaly: No Maceration: Yes Moist: Yes Wound Preparation Ulcer Cleansing: Rinsed/Irrigated with Saline Topical Anesthetic Applied: Other: lidocaine 4%, Electronic Signature(s) Signed: 08/28/2016 9:45:13 AM By: Elliot Gurney, RN, BSN, Kim RN, BSN Entered By: Elliot Gurney, RN, BSN, Kim on 08/28/2016 08:15:43 Guinta, Jens Som ([id number]) -------------------------------------------------------------------------------- Wound Assessment Details Patient Name: Richard Kyle A. Date of Service: 08/28/2016 8:00 AM Medical Record Number: [id number] Patient Account Number: [id number] Date of Birth/Sex: 1966-10-15 (50 y.o. Male) Treating RN: Huel Coventry Primary Care Physician: Angus Palms Other Clinician: Referring Physician: Angus Palms Treating Physician/Extender: Elayne Snare in Treatment: 25 Wound Status Wound Number: 6 Primary Diabetic Wound/Ulcer of the Lower Etiology: Extremity Wound Location: Left Amputation Site - Below Knee - Anterior Wound Open Status: Wounding Event: Shear/Friction Comorbid Chronic sinus problems/congestion, Date Acquired: 07/20/2016 History: Anemia, Asthma, Sleep Apnea, Coronary Weeks Of Treatment: 3 Artery Disease, Hypertension, Myocardial Clustered Wound: No Infarction, Type II Diabetes, History of pressure wounds, Gout, Neuropathy Photos Wound Measurements Length: (cm) 0.5 Width: (cm) 0.6 Depth: (cm) 0.8 Area: (cm) 0.236 Volume: (cm) 0.188 % Reduction in Area: 91.7% % Reduction in Volume: 66.7% Epithelialization:  None Tunneling: No Undermining: Yes Starting Position (o'clock): 7 Ending Position (o'clock): 2 Maximum Distance: (cm) 2.2 Wound Description Classification: Grade 2 Foul Odor  Afte Wound Margin: Distinct, outline attached Exudate Amount: Large Exudate Type: Serous Exudate Color: amber r Cleansing: No Wound Bed Granulation Amount: Medium (34-66%) Exposed Structure Rivenburg, Riker A. ([id number]) Granulation Quality: Pink, Pale Fascia Exposed: No Necrotic Amount: Small (1-33%) Fat Layer Exposed: No Necrotic Quality: Adherent Slough Tendon Exposed: No Muscle Exposed: No Joint Exposed: No Bone Exposed: No Limited to Skin Breakdown Periwound Skin Texture Texture Color No Abnormalities Noted: No No Abnormalities Noted: No Callus: No Atrophie Blanche: No Crepitus: No Cyanosis: No Excoriation: No Ecchymosis: No Fluctuance: No Erythema: No Friable: No Hemosiderin Staining: No Induration: No Mottled: No Localized Edema: No Pallor: No Rash: No Rubor: No Scarring: No Temperature / Pain Moisture Temperature: No Abnormality No Abnormalities Noted: No Dry / Scaly: No Maceration: Yes Moist: Yes Wound Preparation Ulcer Cleansing: Rinsed/Irrigated with Saline Topical Anesthetic Applied: None Electronic Signature(s) Signed: 08/28/2016 9:45:13 AM By: Elliot GurneyWoody, RN, BSN, Kim RN, BSN Entered By: Elliot GurneyWoody, RN, BSN, Kim on 08/28/2016 08:14:52 Mazzaferro, Zaccai A. ([id number]) -------------------------------------------------------------------------------- Wound Assessment Details Patient Name: [name], [name] Date of Service: 08/28/2016 8:00 AM Medical Record Number: [id number] Patient Account Number: [id number] Date of Birth/Sex: December 21, 1965 [age] y.o. Male) Treating RN: Huel CoventryWoody, Kim Primary Care Physician: Angus PalmsGEORGE, SIONNE Other Clinician: Referring Physician: Angus PalmsGEORGE, SIONNE Treating Physician/Extender: Elayne SnarePARKER, PETER Weeks in Treatment: 25 Wound Status Wound Number: 7 Primary  Diabetic Wound/Ulcer of the Lower Etiology: Extremity Wound Location: Left Amputation Site - Below Knee Secondary Pressure Ulcer Etiology: Wounding Event: Gradually Appeared Wound Open Date Acquired: 08/27/2016 Status: Weeks Of Treatment: 0 Comorbid Chronic sinus problems/congestion, Clustered Wound: No History: Anemia, Asthma, Sleep Apnea, Coronary Artery Disease, Hypertension, Myocardial Infarction, Type II Diabetes, History of pressure wounds, Gout, Neuropathy Photos Wound Measurements Length: (cm) 0.6 Width: (cm) 0.5 Depth: (cm) 0.8 Area: (cm) 0.236 Volume: (cm) 0.188 % Reduction in Area: % Reduction in Volume: Epithelialization: None Tunneling: No Undermining: No Wound Description Classification: Grade 2 Wound Margin: Flat and Intact Exudate Amount: Large Exudate Type: Serous Exudate Color: amber Wound Bed Granulation Amount: None Present (0%) Exposed Structure Necrotic Amount: Small (1-33%) Fascia Exposed: No Yohn, Eduin A. ([id number]) Necrotic Quality: Adherent Slough Fat Layer Exposed: No Tendon Exposed: No Muscle Exposed: No Joint Exposed: No Bone Exposed: No Limited to Skin Breakdown Periwound Skin Texture Texture Color No Abnormalities Noted: No No Abnormalities Noted: No Callus: No Atrophie Blanche: No Crepitus: No Cyanosis: No Excoriation: No Ecchymosis: No Fluctuance: No Erythema: No Friable: No Hemosiderin Staining: No Induration: No Mottled: No Localized Edema: No Pallor: No Rash: No Rubor: No Scarring: No Moisture No Abnormalities Noted: No Dry / Scaly: No Maceration: Yes Moist: No Wound Preparation Ulcer Cleansing: Rinsed/Irrigated with Saline Topical Anesthetic Applied: Other Electronic Signature(s) Signed: 08/28/2016 9:45:13 AM By: Elliot GurneyWoody, RN, BSN, Kim RN, BSN Entered By: Elliot GurneyWoody, RN, BSN, Kim on 08/28/2016 23:55:7308:28:05 Idamae SchullerPHILLIPS, Kaenan A.  ([id number]) -------------------------------------------------------------------------------- Vitals Details Patient Name: [name], [name] Date of Service: 08/28/2016 8:00 AM Medical Record Number: [id number] Patient Account Number: [id number] Date of Birth/Sex: December 21, 1965 [age] y.o. Male) Treating RN: Huel CoventryWoody, Kim Primary Care Physician: Angus PalmsGEORGE, SIONNE Other Clinician: Referring Physician: Angus PalmsGEORGE, SIONNE Treating Physician/Extender: Elayne SnarePARKER, PETER Weeks in Treatment: 25 Vital Signs Time Taken: 08:08 Temperature (F): 97.9 Height (in): 66 Pulse (bpm): 69 Weight (lbs): 280 Respiratory Rate (breaths/min): 16 Body Mass Index (BMI): 45.2 Blood Pressure (mmHg): 148/89 Reference Range: 80 - 120 mg / dl Electronic Signature(s) Signed: 08/28/2016 9:45:13 AM By: Elliot GurneyWoody, RN, BSN, Kim RN, BSN Entered By: Elliot GurneyWoody, RN, BSN, Kim on 08/28/2016 08:09:35

## 2016-09-09 DIAGNOSIS — G4733 Obstructive sleep apnea (adult) (pediatric): Secondary | ICD-10-CM | POA: Diagnosis not present

## 2016-09-12 ENCOUNTER — Emergency Department: Payer: PRIVATE HEALTH INSURANCE

## 2016-09-12 ENCOUNTER — Emergency Department
Admission: EM | Admit: 2016-09-12 | Discharge: 2016-09-12 | Disposition: A | Discharge status: Home / Self Care | Payer: PRIVATE HEALTH INSURANCE | Attending: Emergency Medicine | Admitting: Emergency Medicine

## 2016-09-12 DIAGNOSIS — Z79899 Other long term (current) drug therapy: Secondary | ICD-10-CM | POA: Diagnosis not present

## 2016-09-12 DIAGNOSIS — I509 Heart failure, unspecified: Secondary | ICD-10-CM | POA: Diagnosis not present

## 2016-09-12 DIAGNOSIS — I110 Hypertensive heart disease with heart failure: Secondary | ICD-10-CM | POA: Diagnosis not present

## 2016-09-12 DIAGNOSIS — Z794 Long term (current) use of insulin: Secondary | ICD-10-CM | POA: Insufficient documentation

## 2016-09-12 DIAGNOSIS — E119 Type 2 diabetes mellitus without complications: Secondary | ICD-10-CM | POA: Diagnosis not present

## 2016-09-12 DIAGNOSIS — Y9389 Activity, other specified: Secondary | ICD-10-CM | POA: Diagnosis not present

## 2016-09-12 DIAGNOSIS — S20229A Contusion of unspecified back wall of thorax, initial encounter: Secondary | ICD-10-CM | POA: Diagnosis not present

## 2016-09-12 DIAGNOSIS — S299XXA Unspecified injury of thorax, initial encounter: Secondary | ICD-10-CM | POA: Diagnosis present

## 2016-09-12 DIAGNOSIS — Y999 Unspecified external cause status: Secondary | ICD-10-CM | POA: Diagnosis not present

## 2016-09-12 DIAGNOSIS — I2510 Atherosclerotic heart disease of native coronary artery without angina pectoris: Secondary | ICD-10-CM | POA: Insufficient documentation

## 2016-09-12 DIAGNOSIS — Z955 Presence of coronary angioplasty implant and graft: Secondary | ICD-10-CM | POA: Diagnosis not present

## 2016-09-12 DIAGNOSIS — Y929 Unspecified place or not applicable: Secondary | ICD-10-CM | POA: Diagnosis not present

## 2016-09-12 DIAGNOSIS — W010XXA Fall on same level from slipping, tripping and stumbling without subsequent striking against object, initial encounter: Secondary | ICD-10-CM | POA: Insufficient documentation

## 2016-09-12 MED ORDER — CYCLOBENZAPRINE HCL 10 MG PO TABS: 10.0000 mg | ORAL_TABLET | Freq: Three times a day (TID) | ORAL | 0 refills | Status: AC | PRN

## 2016-09-12 MED ORDER — KETOROLAC TROMETHAMINE 60 MG/2ML IM SOLN
60.0000 mg | Freq: Once | INTRAMUSCULAR | Status: AC
  Administered 2016-09-12: 60 mg via INTRAMUSCULAR
  Filled 2016-09-12: qty 2

## 2016-09-12 NOTE — ED Notes (Signed)
Pt discharged home after verbalizing understanding of discharge instructions; nad noted. 

## 2016-09-12 NOTE — ED Triage Notes (Addendum)
Patient ambulatory to triage with steady gait, without difficulty or distress noted; pt reports slipped on floor while at work on Wednesday, having persistent back pain; pt employeed with [employer]--workers comp profile indicates testing only on request; spoke with nursing supervisor Judeth Cornfield(Stephanie) who confirms no testing required

## 2016-09-12 NOTE — ED Provider Notes (Signed)
[facility] Emergency Department Provider Note _   First MD Initiated Contact with Patient 09/12/16 0530     (approximate)  I have reviewed the triage vital signs and the nursing notes.   HISTORY  Chief Complaint Back Pain    HPI [name] is a 50 y.o. male with history of hypertension hyperlipidemia MI sleep apnea CAD CHF presents to the emergency department after a accidental slip and fall while at work. Patient states that he slipped on a wet floor and fell on his back and currently has 10 out of 10 up for an lower back pain. Patient denies any loss of consciousness.   Past Medical History:  Diagnosis Date  . Allergic rhinitis   . CAD (coronary artery disease)   . Congestive heart failure (HCC)   . Diabetes mellitus type II   . GERD (gastroesophageal reflux disease)   . History of nephrolithiasis   . Hyperlipidemia   . Hypertension   . Low back pain   . Myocardial infarction   . Sleep apnea   . Thyroid disease     Patient Active Problem List   Diagnosis Date Noted  . Controlled type 2 diabetes with leg or foot ulcer 05/29/2016  . STAPHYLOCOCCAL INFECTION 11/15/2007  . DIABETES MELLITUS, TYPE II 11/15/2007  . HYPERLIPIDEMIA 11/15/2007  . HYPERTENSION 11/15/2007  . CORONARY ARTERY DISEASE 11/15/2007  . CONGESTIVE HEART FAILURE 11/15/2007  . ALLERGIC RHINITIS 11/15/2007  . GERD 11/15/2007  . LOW BACK PAIN 11/15/2007  . VIRAL MENINGITIS, HX OF 11/15/2007  . NEPHROLITHIASIS, HX OF 11/15/2007    Past Surgical History:  Procedure Laterality Date  . 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 CORONARY ANGIOPLASTY WITH STENT PLACEMENT    . stress cardiolite      Prior to Admission medications   Medication Sig Start Date End Date Taking? Authorizing Provider  albuterol (VENTOLIN HFA) 108 (90 Base) MCG/ACT inhaler Inhale 2 puffs into the lungs every 6 (six) hours as needed. 03/07/15 03/06/16  Historical Provider, MD  allopurinol (ZYLOPRIM)  100 MG tablet Take 100 mg by mouth daily. 11/14/15   Historical Provider, MD  carvedilol (COREG) 6.25 MG tablet Take 6.25 mg by mouth every 12 (twelve) hours.    Historical Provider, MD  cephALEXin (KEFLEX) 500 MG capsule Take 1 capsule (500 mg total) by mouth 3 (three) times daily. 02/01/16   Vivi Barrack, DPM  ciprofloxacin (CIPRO) 500 MG tablet Take 1 tablet (500 mg total) by mouth 2 (two) times daily. 05/06/16   Vivi Barrack, DPM  clobetasol ointment (TEMOVATE) 0.05 % Apply 1 application topically as needed. 06/18/15   Historical Provider, MD  clopidogrel (PLAVIX) 75 MG tablet Take 75 mg by mouth daily. 07/31/15   Historical Provider, MD  cyclobenzaprine (FLEXERIL) 10 MG tablet Take 1 tablet (10 mg total) by mouth 3 (three) times daily as needed. 09/12/16   Darci Current, MD  diclofenac (VOLTAREN) 50 MG EC tablet Take 1 tablet by mouth at bedtime. 11/22/13   Historical Provider, MD  doxycycline (VIBRA-TABS) 100 MG tablet Take 1 tablet by mouth 2 (two) times daily. 11/14/15   Historical Provider, MD  fluticasone (FLONASE) 50 MCG/ACT nasal spray Place 1 spray into the nose 2 (two) times daily. 10/04/14   Historical Provider, MD  gabapentin (NEURONTIN) 300 MG capsule TAKE ONE CAPSULE BY MOUTH TWICE DAILY 01/17/13   Sherlene Shams, MD  ibuprofen (ADVIL,MOTRIN) 800 MG tablet Take 800 mg by  mouth every 6 (six) hours as needed. 03/04/13   Historical Provider, MD  insulin aspart (NOVOLOG FLEXPEN) 100 UNIT/ML FlexPen Inject 25 Units into the skin 3 (three) times daily before meals. Plus sliding scale 09/04/15   Historical Provider, MD  Insulin Glargine (TOUJEO SOLOSTAR) 300 UNIT/ML SOPN Inject 36 Units into the skin 2 (two) times daily. 10/17/15   Historical Provider, MD  levothyroxine (SYNTHROID, LEVOTHROID) 75 MCG tablet Take 75 mcg by mouth daily. 09/07/15 09/06/16  Historical Provider, MD  loratadine (CLARITIN) 10 MG tablet Take 10 mg by mouth daily. 03/07/15   Historical Provider, MD  magnesium  oxide (MAG-OX) 400 MG tablet Take 400 mg by mouth daily.      Historical Provider, MD  metoCLOPramide (REGLAN) 10 MG tablet Take 10 mg by mouth 3 (three) times daily.    Historical Provider, MD  metolazone (ZAROXOLYN) 5 MG tablet Take 5 mg by mouth daily. Take 1/2- 1 by mouth once daily prn      Historical Provider, MD  mupirocin ointment (BACTROBAN) 2 % Apply 1 application topically 2 (two) times daily. 02/01/16   Vivi BarrackMatthew R Wagoner, DPM  nitroGLYCERIN (NITROSTAT) 0.4 MG SL tablet Place 1 tablet under the tongue every 5 (five) minutes x 3 doses as needed. 04/07/15 04/06/16  Historical Provider, MD  Omeprazole-Sodium Bicarbonate (ZEGERID) 20-1100 MG CAPS capsule Take 1 capsule by mouth daily. 08/30/09   Historical Provider, MD  oxyCODONE-acetaminophen (PERCOCET/ROXICET) 5-325 MG tablet Take 1-2 tablets by mouth every 6 (six) hours as needed. 10/15/13   Historical Provider, MD  senna (SENNA-TIME) 8.6 MG tablet Take 1 tablet by mouth daily as needed. 10/18/08   Historical Provider, MD  spironolactone (ALDACTONE) 25 MG tablet Take 25 mg by mouth daily.      Historical Provider, MD  torsemide (DEMADEX) 20 MG tablet Take 2 tablets by mouth as directed. 03/17/14   Historical Provider, MD    Allergies Amitriptyline; Other; Pravastatin; Ace inhibitors; Iodinated diagnostic agents; Sulfamethoxazole-trimethoprim; and Tape  No family history on file.  Social History Social History  Substance Use Topics  . Smoking status: Never Smoker  . Smokeless tobacco: Never Used  . Alcohol use No    Review of Systems Constitutional: No fever/chills Eyes: No visual changes. ENT: No sore throat. Cardiovascular: Denies chest pain. Respiratory: Denies shortness of breath. Gastrointestinal: No abdominal pain.  No nausea, no vomiting.  No diarrhea.  No constipation. Genitourinary: Negative for dysuria. Musculoskeletal: Positive for back pain. Skin: Negative for rash. Neurological: Negative for headaches, focal  weakness or numbness.  10-point ROS otherwise negative.  ____________________________________________   PHYSICAL EXAM:  VITAL SIGNS: ED Triage Vitals  Enc Vitals Group     BP 09/12/16 0517 (!) 152/96     Pulse Rate 09/12/16 0517 78     Resp 09/12/16 0517 18     Temp 09/12/16 0517 98 F (36.7 C)     Temp Source 09/12/16 0517 Oral     SpO2 09/12/16 0517 99 %     Weight 09/12/16 0516 279 lb (126.6 kg)     Height 09/12/16 0516 5\' 6"  (1.676 m)     Head Circumference --      Peak Flow --      Pain Score 09/12/16 0513 10     Pain Loc --      Pain Edu? --      Excl. in GC? --     Constitutional: Alert and oriented. Well appearing and in no acute distress. Eyes: Conjunctivae are  normal. PERRL. EOMI. Head: Atraumatic. Mouth/Throat: Mucous membranes are moist.  Oropharynx non-erythematous. Neck: No stridor.  No cervical spine tenderness to palpation. Cardiovascular: Normal rate, regular rhythm. Good peripheral circulation. Grossly normal heart sounds. Respiratory: Normal respiratory effort.  No retractions. Lungs CTAB. Gastrointestinal: Soft and nontender. No distention.  Musculoskeletal: Pain to palpation of the lumbar and thoracic paraspinal muscles  Neurologic:  Normal speech and language. No gross focal neurologic deficits are appreciated.  Skin:  Skin is warm, dry and intact. No rash noted. Psychiatric: Mood and affect are normal. Speech and behavior are normal.    RADIOLOGY I, Fenton N Valeska Haislip, personally viewed and evaluated these images (plain radiographs) as part of my medical decision making, as well as reviewing the written report by the radiologist.  No results found.   Procedures    INITIAL IMPRESSION / ASSESSMENT AND PLAN / ED COURSE  Pertinent labs & imaging results that were available during my care of the patient were reviewed by me and considered in my medical decision making (see chart for details).  50 year old male presented status post slip and  fall with resultant back injury. Patient given Toradol in the emergency department will be prescribed Flexeril. Thoracic and lumbar spine x-rays revealed no acute fracture or other pathology.   Clinical Course    ____________________________________________  FINAL CLINICAL IMPRESSION(S) / ED DIAGNOSES  Final diagnoses:  Contusion of back, unspecified laterality, initial encounter     MEDICATIONS GIVEN DURING THIS VISIT:  Medications  ketorolac (TORADOL) injection 60 mg (60 mg Intramuscular Given 09/12/16 0623)     NEW OUTPATIENT MEDICATIONS STARTED DURING THIS VISIT:  Discharge Medication List as of 09/12/2016  7:08 AM    START taking these medications   Details  cyclobenzaprine (FLEXERIL) 10 MG tablet Take 1 tablet (10 mg total) by mouth 3 (three) times daily as needed., Starting Fri 09/12/2016, Print        Discharge Medication List as of 09/12/2016  7:08 AM      Discharge Medication List as of 09/12/2016  7:08 AM       Note:  This document was prepared using Dragon voice recognition software and may include unintentional dictation errors.    Darci Current, MD 09/14/16 [phone]

## 2016-09-17 DIAGNOSIS — L97919 Non-pressure chronic ulcer of unspecified part of right lower leg with unspecified severity: Secondary | ICD-10-CM | POA: Diagnosis not present

## 2016-09-18 ENCOUNTER — Ambulatory Visit: Payer: PPO | Admitting: Surgery

## 2016-10-07 DIAGNOSIS — E113393 Type 2 diabetes mellitus with moderate nonproliferative diabetic retinopathy without macular edema, bilateral: Secondary | ICD-10-CM | POA: Diagnosis not present

## 2016-10-08 ENCOUNTER — Encounter (INDEPENDENT_AMBULATORY_CARE_PROVIDER_SITE_OTHER): Payer: PPO | Admitting: Ophthalmology

## 2016-10-08 DIAGNOSIS — E11319 Type 2 diabetes mellitus with unspecified diabetic retinopathy without macular edema: Secondary | ICD-10-CM | POA: Diagnosis not present

## 2016-10-08 DIAGNOSIS — I10 Essential (primary) hypertension: Secondary | ICD-10-CM | POA: Diagnosis not present

## 2016-10-08 DIAGNOSIS — H35033 Hypertensive retinopathy, bilateral: Secondary | ICD-10-CM | POA: Diagnosis not present

## 2016-10-08 DIAGNOSIS — H43813 Vitreous degeneration, bilateral: Secondary | ICD-10-CM | POA: Diagnosis not present

## 2016-10-08 DIAGNOSIS — E113593 Type 2 diabetes mellitus with proliferative diabetic retinopathy without macular edema, bilateral: Secondary | ICD-10-CM | POA: Diagnosis not present

## 2016-10-08 DIAGNOSIS — H4311 Vitreous hemorrhage, right eye: Secondary | ICD-10-CM

## 2016-10-08 DIAGNOSIS — E113393 Type 2 diabetes mellitus with moderate nonproliferative diabetic retinopathy without macular edema, bilateral: Secondary | ICD-10-CM | POA: Diagnosis not present

## 2016-10-15 DIAGNOSIS — L97919 Non-pressure chronic ulcer of unspecified part of right lower leg with unspecified severity: Secondary | ICD-10-CM | POA: Diagnosis not present

## 2016-10-16 ENCOUNTER — Encounter: Payer: PPO | Attending: Nurse Practitioner | Admitting: Nurse Practitioner

## 2016-10-16 DIAGNOSIS — G4730 Sleep apnea, unspecified: Secondary | ICD-10-CM | POA: Diagnosis not present

## 2016-10-16 DIAGNOSIS — E11622 Type 2 diabetes mellitus with other skin ulcer: Secondary | ICD-10-CM | POA: Diagnosis not present

## 2016-10-16 DIAGNOSIS — L8993 Pressure ulcer of unspecified site, stage 3: Secondary | ICD-10-CM | POA: Diagnosis not present

## 2016-10-16 DIAGNOSIS — I509 Heart failure, unspecified: Secondary | ICD-10-CM | POA: Diagnosis not present

## 2016-10-16 DIAGNOSIS — L97822 Non-pressure chronic ulcer of other part of left lower leg with fat layer exposed: Secondary | ICD-10-CM | POA: Diagnosis not present

## 2016-10-16 DIAGNOSIS — E1122 Type 2 diabetes mellitus with diabetic chronic kidney disease: Secondary | ICD-10-CM | POA: Insufficient documentation

## 2016-10-16 DIAGNOSIS — Z89512 Acquired absence of left leg below knee: Secondary | ICD-10-CM | POA: Diagnosis not present

## 2016-10-16 DIAGNOSIS — N184 Chronic kidney disease, stage 4 (severe): Secondary | ICD-10-CM | POA: Diagnosis not present

## 2016-10-16 DIAGNOSIS — F329 Major depressive disorder, single episode, unspecified: Secondary | ICD-10-CM | POA: Insufficient documentation

## 2016-10-16 DIAGNOSIS — Z794 Long term (current) use of insulin: Secondary | ICD-10-CM | POA: Insufficient documentation

## 2016-10-16 DIAGNOSIS — M109 Gout, unspecified: Secondary | ICD-10-CM | POA: Diagnosis not present

## 2016-10-16 DIAGNOSIS — I429 Cardiomyopathy, unspecified: Secondary | ICD-10-CM | POA: Insufficient documentation

## 2016-10-16 DIAGNOSIS — I2510 Atherosclerotic heart disease of native coronary artery without angina pectoris: Secondary | ICD-10-CM | POA: Diagnosis not present

## 2016-10-16 DIAGNOSIS — I252 Old myocardial infarction: Secondary | ICD-10-CM | POA: Insufficient documentation

## 2016-10-16 DIAGNOSIS — I130 Hypertensive heart and chronic kidney disease with heart failure and stage 1 through stage 4 chronic kidney disease, or unspecified chronic kidney disease: Secondary | ICD-10-CM | POA: Insufficient documentation

## 2016-10-16 DIAGNOSIS — Z992 Dependence on renal dialysis: Secondary | ICD-10-CM | POA: Diagnosis not present

## 2016-10-16 DIAGNOSIS — E1140 Type 2 diabetes mellitus with diabetic neuropathy, unspecified: Secondary | ICD-10-CM | POA: Insufficient documentation

## 2016-10-16 DIAGNOSIS — Z6841 Body Mass Index (BMI) 40.0 and over, adult: Secondary | ICD-10-CM | POA: Insufficient documentation

## 2016-10-16 DIAGNOSIS — D649 Anemia, unspecified: Secondary | ICD-10-CM | POA: Insufficient documentation

## 2016-10-16 DIAGNOSIS — E785 Hyperlipidemia, unspecified: Secondary | ICD-10-CM | POA: Insufficient documentation

## 2016-10-16 DIAGNOSIS — J45909 Unspecified asthma, uncomplicated: Secondary | ICD-10-CM | POA: Insufficient documentation

## 2016-10-17 NOTE — Progress Notes (Addendum)
Richard, [name] ([id number]) Visit Report for 10/16/2016 Allergy List Details Patient Name: [name], [name] A. Date of Service: 10/16/2016 8:15 AM Medical Record Number: [id number] Patient Account Number: [id number] Date of Birth/Sex: 23-May-1966 (50 y.o. Male) Treating RN: Clover Mealy, RN, BSN, [place] Sink Primary Care Physician: Angus Palms Other Clinician: Referring Physician: Angus Palms Treating Physician/Extender: Bonnell Public Weeks in Treatment: 0 Allergies Active Allergies amitriptyline Reaction: nausea Severity: Severe Pravachol Reaction: muscles ache and elevated CK Severity: Severe Septra Reaction: mouth ulcers Severity: Severe ACE Inhibitors Reaction: increases creatinine Severity: Severe lovastatin Reaction: myalgia Severity: Severe adhesive Reaction: rash Severity: Severe Iodinated Contrast Media - Oral and IV Dye Reaction: kidney disorder Severity: Severe Tree Nuts Reaction: anaphalaxis Severity: Severe [name], Richard AMarland Kitchen ([id number]) Allergy Notes Electronic Signature(s) Signed: 10/16/2016 8:08:49 AM By: Elpidio Eric BSN, RN Entered By: Elpidio Eric on 10/16/2016 08:08:49 [name], Richard AMarland Kitchen ([id number]) -------------------------------------------------------------------------------- Arrival Information Details Patient Name: Richard Kyle A. Date of Service: 10/16/2016 8:15 AM Medical Record Number: [id number] Patient Account Number: [id number] Date of Birth/Sex: June 19, 1966 (50 y.o. Male) Treating RN: Clover Mealy, RN, BSN, [place] Sink Primary Care Physician: Angus Palms Other Clinician: Referring Physician: Angus Palms Treating Physician/Extender: Kathreen Cosier in Treatment: 0 Visit Information Patient Arrived: Ambulatory Arrival Time: 08:18 Accompanied By: self Transfer Assistance: None Patient Identification Verified: Yes Secondary Verification Process Yes Completed: Patient Requires Transmission-Based No Precautions: Patient Has Alerts:  No History Since Last Visit All ordered tests and consults were completed: No Added or deleted any medications: No Any new allergies or adverse reactions: No Had a fall or experienced change in activities of daily living that may affect risk of falls: No Signs or symptoms of abuse/neglect since last visito No Hospitalized since last visit: No Has Dressing in Place as Prescribed: Yes Pain Present Now: No Electronic Signature(s) Signed: 10/17/2016 12:03:08 PM By: Elpidio Eric BSN, RN Entered By: Elpidio Eric on 10/16/2016 08:19:09 [name], Richard AMarland Kitchen ([id number]) -------------------------------------------------------------------------------- Encounter Discharge Information Details Patient Name: Richard Kyle A. Date of Service: 10/16/2016 8:15 AM Medical Record Number: [id number] Patient Account Number: [id number] Date of Birth/Sex: 12-16-1965 (50 y.o. Male) Treating RN: Clover Mealy, RN, BSN, [place] Sink Primary Care Physician: Angus Palms Other Clinician: Referring Physician: Angus Palms Treating Physician/Extender: Kathreen Cosier in Treatment: 0 Encounter Discharge Information Items Discharge Pain Level: 0 Discharge Condition: Stable Ambulatory Status: Ambulatory Discharge Destination: Home Transportation: Private Auto Accompanied By: self Schedule Follow-up Appointment: No Medication Reconciliation completed and provided to Patient/Care No Amma Crear: Provided on Clinical Summary of Care: 10/16/2016 Form Type Recipient Paper Patient DP Electronic Signature(s) Signed: 10/16/2016 9:04:14 AM By: Gwenlyn Perking Entered By: Gwenlyn Perking on 10/16/2016 09:04:14 Thum, Ascencion A. ([id number]) -------------------------------------------------------------------------------- Lower Extremity Assessment Details Patient Name: Richard Kyle A. Date of Service: 10/16/2016 8:15 AM Medical Record Number: [id number] Patient Account Number: [id number] Date of Birth/Sex: [birth year] (50  y.o. Male) Treating RN: Clover Mealy, RN, BSN, [place] Sink Primary Care Physician: Angus Palms Other Clinician: Referring Physician: Angus Palms Treating Physician/Extender: Kathreen Cosier in Treatment: 0 Electronic Signature(s) Signed: 10/16/2016 8:11:56 AM By: Elpidio Eric BSN, RN Entered By: Elpidio Eric on 10/16/2016 08:11:56 [name], Richard AMarland Kitchen ([id number]) -------------------------------------------------------------------------------- Multi Wound Chart Details Patient Name: Richard Kyle A. Date of Service: 10/16/2016 8:15 AM Medical Record Number: [id number] Patient Account Number: [id number] Date of Birth/Sex: 1966/10/06 (50 y.o. 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] [9:the Lower Extremity] Comorbid History: [8:Chronic sinus problems/congestion, Anemia, Asthma, Sleep Anemia, Asthma, Sleep Apnea, Coronary Artery Apnea, Coronary Artery Disease, Hypertension, Myocardial Infarction, Type II Diabetes, History Type II Diabetes, History of  pressure wounds, Gout, of pressure wounds, Gout, Neuropathy] [9:Chronic sinus problems/congestion, Disease, Hypertension, Myocardial Infarction, Neuropathy] [N/A:N/A] Date Acquired: [8:10/16/2016] [9:10/16/2016] [N/A:N/A] Weeks of Treatment: [8:0] [9:0] [N/A:N/A] Wound Status: [8:Open] [9:Open]  [N/A:N/A] Measurements L x W x D 3.5x10x0.2 [9:0.5x0.5x0.1] [N/A:N/A] (cm) Area (cm) : [8:27.489] [9:0.196] [N/A:N/A] Volume (cm) : [8:5.498] [9:0.02] [N/A:N/A] Classification: [8:Grade 1] [9:Grade 1] [N/A:N/A] Exudate Amount: [8:Large] [9:Medium] [N/A:N/A] Exudate Type: [8:Serosanguineous] [9:Serosanguineous] [N/A:N/A] Exudate Color: [8:red, brown] [9:red, brown] [N/A:N/A] Foul Odor After [8:Yes] [9:Yes] [N/A:N/A] Cleansing: Odor Anticipated Due to No [9:No] [N/A:N/A] Product Use: Wherley, Jibril A. ([id number]) Wound Margin: Distinct, outline attached Distinct, outline attached N/A Granulation Amount: Large (67-100%) Large (67-100%) N/A Granulation Quality: Red, Pink Pink, Pale N/A Necrotic Amount: None Present (0%) None Present (0%) N/A Exposed Structures: Fascia: No Fascia: No N/A Fat: No Fat: No Tendon: No Tendon: No Muscle: No Muscle: No Joint: No Joint: No Bone: No Bone: No Limited to Skin Limited to Skin Breakdown Breakdown Epithelialization: None None N/A Periwound Skin Texture: Edema: No Edema: No N/A Excoriation: No Excoriation: No Induration: No Induration: No Callus: No Callus: No Crepitus: No Crepitus: No Fluctuance: No Fluctuance: No Friable: No Friable: No Rash: No Rash: No Scarring: No Scarring: No Periwound Skin Maceration: Yes Moist: Yes N/A Moisture: Moist: Yes Maceration: No Dry/Scaly: No Dry/Scaly: No Periwound Skin Color: Atrophie Blanche: No Atrophie Blanche: No N/A Cyanosis: No Cyanosis: No Ecchymosis: No Ecchymosis: No Erythema: No Erythema: No Hemosiderin Staining: No Hemosiderin Staining: No Mottled: No Mottled: No Pallor: No Pallor: No Rubor: No Rubor: No Temperature: No Abnormality No Abnormality N/A Tenderness on No No N/A Palpation: Wound Preparation: Ulcer Cleansing: Other: Ulcer Cleansing: Other: N/A surg scrub and water surg scrub and water Topical Anesthetic Topical Anesthetic Applied:  None Applied: None Treatment Notes Electronic Signature(s) Signed: 10/17/2016 12:03:08 PM By: Elpidio EricAfful, [name] BSN, RN Entered By: Elpidio EricAfful, [name] on 10/16/2016 08:44:14 [name], Richard AMarland Kitchen. ([id number]) -------------------------------------------------------------------------------- Multi-Disciplinary Care Plan Details Patient Name: Richard KylePHILLIPS, Lowry A. Date of Service: 10/16/2016 8:15 AM Medical Record Number: [id number] Patient Account Number: [id number] Date of Birth/Sex: 04/19/1966 [age] y.o. Male) Treating RN: Clover MealyAfful, RN, BSN, [place] Primary Care Physician: Angus PalmsGEORGE, SIONNE Other Clinician: Referring Physician: Angus PalmsGEORGE, SIONNE Treating Physician/Extender: Kathreen Cosieroulter, Leah Weeks in Treatment: 0 Active Inactive Abuse / Safety / Falls / Self Care Management Nursing Diagnoses: Impaired home maintenance Impaired physical mobility Knowledge deficit related to: safety; personal, health (wound), emergency Potential for falls Self care deficit: actual or potential Goals: Patient will remain injury free Date Initiated: 10/16/2016 Goal Status: Active Patient/caregiver will verbalize understanding of skin care regimen Date Initiated: 10/16/2016 Goal Status: Active Patient/caregiver will verbalize/demonstrate measure taken to improve self care Date Initiated: 10/16/2016 Goal Status: Active Patient/caregiver will verbalize/demonstrate measures taken to improve the patient's personal safety Date Initiated: 10/16/2016 Goal Status: Active Patient/caregiver will verbalize/demonstrate measures taken to prevent injury and/or falls Date Initiated: 10/16/2016 Goal Status: Active Interventions: Assess fall risk on admission and as needed Assess: immobility, friction, shearing, incontinence upon admission and as needed Assess impairment  of mobility on admission and as needed per policy Assess self care needs on admission and as needed Provide education on basic hygiene Provide education on fall  prevention Provide education on personal and home safety Provide education on safe transfers [name], BHAGAT. ([id number]) Notes: Orientation to the Wound Care Program Nursing Diagnoses: Knowledge deficit related to the wound healing center program Goals: Patient/caregiver will verbalize understanding of the Wound Healing Center Program Date Initiated: 10/16/2016 Goal Status: Active Interventions: Provide education on orientation to the wound center Notes: Pressure Nursing Diagnoses: Knowledge deficit related to causes and risk factors for pressure ulcer development Knowledge deficit related to management of pressures ulcers Potential for impaired tissue integrity related to pressure, friction, moisture, and shear Goals: Patient will remain free from development of additional pressure ulcers Date Initiated: 10/16/2016 Goal Status: Active Patient will remain free of pressure ulcers Date Initiated: 10/16/2016 Goal Status: Active Patient/caregiver will verbalize risk factors for pressure ulcer development Date Initiated: 10/16/2016 Goal Status: Active Patient/caregiver will verbalize understanding of pressure ulcer management Date Initiated: 10/16/2016 Goal Status: Active Interventions: Assess: immobility, friction, shearing, incontinence upon admission and as needed Assess offloading mechanisms upon admission and as needed Assess potential for pressure ulcer upon admission and as needed Provide education on pressure ulcers Treatment Activities: Patient referred for home evaluation of offloading devices/mattresses : 10/16/2016 [name], [name] ([id number]) Patient referred for pressure reduction/relief devices : 10/16/2016 Pressure reduction/relief device ordered : 10/16/2016 Notes: Wound/Skin Impairment Nursing Diagnoses: Impaired tissue integrity Knowledge deficit related to ulceration/compromised skin integrity Goals: Patient/caregiver will verbalize understanding  of skin care regimen Date Initiated: 10/16/2016 Goal Status: Active Ulcer/skin breakdown will have a volume reduction of 30% by week 4 Date Initiated: 10/16/2016 Goal Status: Active Ulcer/skin breakdown will have a volume reduction of 50% by week 8 Date Initiated: 10/16/2016 Goal Status: Active Ulcer/skin breakdown will have a volume reduction of 80% by week 12 Date Initiated: 10/16/2016 Goal Status: Active Ulcer/skin breakdown will heal within 14 weeks Date Initiated: 10/16/2016 Goal Status: Active Interventions: Assess patient/caregiver ability to obtain necessary supplies Assess patient/caregiver ability to perform ulcer/skin care regimen upon admission and as needed Assess ulceration(s) every visit Provide education on ulcer and skin care Treatment Activities: Referred to DME [name] [place] for dressing supplies : 10/16/2016 Skin care regimen initiated : 10/16/2016 Topical wound management initiated : 10/16/2016 Notes: Electronic Signature(s) Signed: 10/17/2016 12:03:08 PM By: Elpidio Eric BSN, RN Entered By: Elpidio Eric on 10/16/2016 08:43:22 Cantrall, Deagen A. ([id number]) Wyeth, Jefferie AMarland Kitchen ([id number]) -------------------------------------------------------------------------------- Pain Assessment Details Patient Name: Richard Kyle A. Date of Service: 10/16/2016 8:15 AM Medical Record Number: [id number] Patient Account Number: [id number] Date of Birth/Sex: 02-Dec-1965 (50 y.o. Male) Treating RN: Clover Mealy, RN, BSN, [place] Sink Primary Care Physician: Angus Palms Other Clinician: Referring Physician: Angus Palms Treating Physician/Extender: Kathreen Cosier in Treatment: 0 Active Problems Location of Pain Severity and Description of Pain Patient Has Paino No Site Locations With Dressing Change: No Pain Management and Medication Current Pain Management: Electronic Signature(s) Signed: 10/17/2016 12:03:08 PM By: Elpidio Eric BSN, RN Entered By: Elpidio Eric on 10/16/2016  08:19:18 Wagoner, Jens Som ([id number]) -------------------------------------------------------------------------------- Patient/Caregiver Education Details Patient Name: Richard Kyle A. Date of Service: 10/16/2016 8:15 AM Medical Record Number: [id number] Patient Account Number: [id number] Date of Birth/Gender: 1966-05-02 (50 y.o. Male) Treating RN: Clover Mealy, RN, BSN, [place] Sink Primary Care Physician: Angus Palms Other Clinician: Referring Physician: Angus Palms Treating Physician/Extender: Kathreen Cosier in Treatment: 0 Education Assessment Education Provided To: Patient Education Topics Provided Basic Hygiene: Methods:  Explain/Verbal Responses: State content correctly Pressure: Methods: Explain/Verbal Responses: State content correctly Safety: Methods: Explain/Verbal Responses: State content correctly Welcome To The Wound [facility] Center: Methods: Explain/Verbal Responses: State content correctly Wound/Skin Impairment: Methods: Explain/Verbal Responses: State content correctly Electronic Signature(s) Signed: 10/17/2016 12:03:08 PM By: Elpidio EricAfful, [name] BSN, RN Entered By: Elpidio EricAfful, [name] on 10/16/2016 08:52:16 Botting, Aladdin A. ([id number]) -------------------------------------------------------------------------------- Wound Assessment Details Patient Name: Richard KylePHILLIPS, Tyreon A. Date of Service: 10/16/2016 8:15 AM Medical Record Number: [id number] Patient Account Number: [id number] Date of Birth/Sex: 1966-04-07 [age] y.o. 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 Wound Open Status: Wounding Event: Pressure Injury Comorbid Chronic sinus problems/congestion, Date  Acquired: 10/16/2016 History: Anemia, Asthma, Sleep Apnea, Coronary Weeks Of Treatment: 0 Artery Disease, Hypertension, Myocardial Clustered Wound: No Infarction, Type II Diabetes, History of pressure wounds, Gout, Neuropathy Photos Photo Uploaded By: Elpidio EricAfful, [name] on 10/16/2016 16:28:24 Wound Measurements Length: (cm) 3.5 Width: (cm) 10 Depth: (cm) 0.2 Area: (cm) 27.489 Volume: (cm) 5.498 % Reduction in Area: % Reduction in Volume: Epithelialization: None Tunneling: No Undermining: No Wound Description Classification: Grade 1 Wound Margin: Distinct, outline attached Exudate Amount: Large Exudate Type: Serosanguineous Exudate Color: red, brown Foul Odor After Cleansing: Yes Due to Product Use: No Wound Bed Granulation Amount: Large (67-100%) Exposed Structure Granulation Quality: Red, Pink Fascia Exposed: No Linhart, Rolf A. ([id number]) Necrotic Amount: None Present (0%) Fat Layer Exposed: No Tendon Exposed: No Muscle Exposed: No Joint Exposed: No Bone Exposed: No Limited to Skin Breakdown Periwound Skin Texture Texture Color No Abnormalities Noted: No No Abnormalities Noted: No Callus: No Atrophie Blanche: No Crepitus: No Cyanosis: No Excoriation: No Ecchymosis: No Fluctuance: No Erythema: No Friable: No Hemosiderin Staining: No Induration: No Mottled: No Localized Edema: No Pallor: No Rash: No Rubor: No Scarring: No Temperature / Pain Moisture Temperature: No Abnormality No Abnormalities Noted: No Dry / Scaly: No Maceration: Yes Moist: Yes Wound Preparation Ulcer Cleansing: Other: surg scrub and water, Topical Anesthetic Applied: None Treatment Notes Wound #8 (Left, Posterior Amputation Site - Above Knee) 4. Dressing Applied: Aquacel Ag 5. Secondary Dressing Applied Gauze and Kerlix/Conform 7. Secured with Secretary/administratorTape Electronic Signature(s) Signed: 10/17/2016 12:03:08 PM By: Elpidio EricAfful, [name] BSN, RN Entered By: Elpidio EricAfful, [name] on 10/16/2016  08:35:12 Boyers, Deforest AMarland Kitchen. ([id number]) -------------------------------------------------------------------------------- Wound Assessment Details Patient Name: Richard KylePHILLIPS, Eljay A. Date of Service: 10/16/2016 8:15 AM Medical Record Number: [id number] Patient Account Number: [id number] Date of Birth/Sex: 1966-04-07 [age] y.o. Male) Treating RN: Clover MealyAfful, RN, BSN, [name] Primary Care Physician: Angus PalmsGEORGE, SIONNE Other Clinician: Referring Physician: Angus PalmsGEORGE, SIONNE Treating Physician/Extender: Kathreen Cosieroulter, Leah Weeks in Treatment: 0 Wound Status Wound Number: 9 Primary Diabetic Wound/Ulcer of the Lower Etiology: Extremity Wound Location: Left Amputation Site - Below Knee - Anterior Wound Open Status: Wounding Event: Pressure Injury Comorbid Chronic sinus problems/congestion, Date Acquired: 10/16/2016 History: Anemia, Asthma, Sleep Apnea, Coronary Weeks Of Treatment: 0 Artery Disease, Hypertension, Myocardial Clustered Wound: No Infarction, Type II Diabetes, History of pressure wounds, Gout, Neuropathy Photos Photo Uploaded By: Elpidio EricAfful, [name] on 10/16/2016 16:28:25 Wound Measurements Length: (cm) 0.5 Width: (cm) 0.5 Depth: (cm) 0.1 Area: (cm) 0.196 Volume: (cm) 0.02 % Reduction in Area: % Reduction in Volume: Epithelialization: None Tunneling: No Undermining: No Wound Description Classification: Grade 1 Wound Margin: Distinct, outline attached Exudate Amount: Medium Exudate  Type: Serosanguineous Exudate Color: red, brown Foul Odor After Cleansing: Yes Due to Product Use: No Wound Bed Granulation Amount: Large (67-100%) Exposed Structure Granulation Quality: Pink, Pale Fascia Exposed: No Laconte, Jaystin A. ([id number]) Necrotic Amount: None Present (0%) Fat Layer Exposed: No Tendon Exposed: No Muscle Exposed: No Joint Exposed: No Bone Exposed: No Limited to Skin Breakdown Periwound Skin Texture Texture Color No Abnormalities Noted: No No Abnormalities Noted: No Callus:  No Atrophie Blanche: No Crepitus: No Cyanosis: No Excoriation: No Ecchymosis: No Fluctuance: No Erythema: No Friable: No Hemosiderin Staining: No Induration: No Mottled: No Localized Edema: No Pallor: No Rash: No Rubor: No Scarring: No Temperature / Pain Moisture Temperature: No Abnormality No Abnormalities Noted: No Dry / Scaly: No Maceration: No Moist: Yes Wound Preparation Ulcer Cleansing: Other: surg scrub and water, Topical Anesthetic Applied: None Treatment Notes Wound #9 (Left, Anterior Amputation Site - Below Knee) 4. Dressing Applied: Aquacel Ag 5. Secondary Dressing Applied Gauze and Kerlix/Conform 7. Secured with Secretary/administrator) Signed: 10/17/2016 12:03:08 PM By: Elpidio Eric BSN, RN Entered By: Elpidio Eric on 10/16/2016 08:36:43 Carda, Ramses AMarland Kitchen ([id number]) -------------------------------------------------------------------------------- Vitals Details Patient Name: Richard Kyle A. Date of Service: 10/16/2016 8:15 AM Medical Record Number: [id number] Patient Account Number: [id number] Date of Birth/Sex: 1966-09-06 (50 y.o. Male) Treating RN: Clover Mealy, RN, BSN,  Sink Primary Care Physician: Angus Palms Other Clinician: Referring Physician: Angus Palms Treating Physician/Extender: Kathreen Cosier in Treatment: 0 Vital Signs Time Taken: 08:18 Temperature (F): 98.1 Pulse (bpm): 72 Respiratory Rate (breaths/min): 18 Blood Pressure (mmHg): 150/90 Reference Range: 80 - 120 mg / dl Electronic Signature(s) Signed: 10/17/2016 12:03:08 PM By: Elpidio Eric BSN, RN Entered By: Elpidio Eric on 10/16/2016 08:22:39

## 2016-10-17 NOTE — Progress Notes (Signed)
Richard SchullerHILLIPS, [name] ([id number]) Visit Report for 10/16/2016 Abuse/Suicide Risk Screen Details Patient Name: Richard KyleHILLIPS, [name] Date of Service: 10/16/2016 8:15 AM Medical Record Number: [id number] Patient Account Number: [id number] Date of Birth/Sex: 07-13-1966 [age] y.o. Male) Treating RN: Clover MealyAfful, RN, BSN, [place] Primary Care Physician: Angus PalmsGEORGE, SIONNE Other Clinician: Referring Physician: Angus PalmsGEORGE, SIONNE Treating Physician/Extender: Kathreen Cosieroulter, Leah Weeks in Treatment: 0 Abuse/Suicide Risk Screen Items Answer ABUSE/SUICIDE RISK SCREEN: Has anyone close to you tried to hurt or harm you recentlyo No Do you feel uncomfortable with anyone in your familyo No Has anyone forced you do things that you didnot want to doo No Do you have any thoughts of harming yourselfo No Patient displays signs or symptoms of abuse and/or neglect. No Electronic Signature(s) Signed: 10/16/2016 8:10:08 AM By: Elpidio EricAfful, Rita BSN, RN Entered By: Elpidio EricAfful, Rita on 10/16/2016 08:10:08 [name], Richard AMarland Kitchen. ([id number]) -------------------------------------------------------------------------------- Activities of Daily Living Details Patient Name: [name], [name] Date of Service: 10/16/2016 8:15 AM Medical Record Number: [id number] Patient Account Number: [id number] Date of Birth/Sex: 07-13-1966 [age] y.o. Male) Treating RN: Clover MealyAfful, RN, BSN, [place] Primary Care Physician: Angus PalmsGEORGE, SIONNE Other Clinician: Referring Physician: Angus PalmsGEORGE, SIONNE Treating Physician/Extender: Kathreen Cosieroulter, Leah Weeks in Treatment: 0 Activities of Daily Living Items Answer Activities of Daily Living (Please select one for each item) Drive Automobile Completely Able Take Medications Completely Able Use Telephone Completely Able Care for Appearance Completely Able Use Toilet Completely Able Bath / Shower Completely Able Dress Self Completely Able Feed Self Completely Able Walk Completely Able Get In / Out Bed Completely Able Housework Completely  Able Prepare Meals Completely Able Handle Money Completely Able Shop for Self Completely Able Electronic Signature(s) Signed: 10/16/2016 8:10:27 AM By: Elpidio EricAfful, Rita BSN, RN Entered By: Elpidio EricAfful, Rita on 10/16/2016 08:10:27 [name], [name] ([id number]) -------------------------------------------------------------------------------- Education Assessment Details Patient Name: [name], [name] Date of Service: 10/16/2016 8:15 AM Medical Record Number: [id number] Patient Account Number: [id number] Date of Birth/Sex: 07-13-1966 [age] y.o. Male) Treating RN: Clover MealyAfful, RN, BSN, [place] Sinkita Primary Care Physician: Angus PalmsGEORGE, SIONNE Other Clinician: Referring Physician: Angus PalmsGEORGE, SIONNE Treating Physician/Extender: Kathreen Cosieroulter, Leah Weeks in Treatment: 0 Primary Learner Assessed: Patient Learning Preferences/Education Level/Primary Language Learning Preference: Explanation Highest Education Level: High School Preferred Language: English Cognitive Barrier Assessment/Beliefs Language Barrier: No Physical Barrier Assessment Impaired Vision: No Impaired Hearing: No Decreased Hand dexterity: No Knowledge/Comprehension Assessment Knowledge Level: High Comprehension Level: High Ability to understand written High instructions: Ability to understand verbal High instructions: Motivation Assessment Anxiety Level: Calm Cooperation: Cooperative Education Importance: Acknowledges Need Interest in Health Problems: Asks Questions Perception: Coherent Willingness to Engage in Self- High Management Activities: Readiness to Engage in Self- High Management Activities: Electronic Signature(s) Signed: 10/16/2016 8:11:05 AM By: Elpidio EricAfful, Rita BSN, RN Entered By: Elpidio EricAfful, Rita on 10/16/2016 08:11:05 [name], Richard AMarland Kitchen. ([id number]) -------------------------------------------------------------------------------- Fall Risk Assessment Details Patient Name: [name], [name] Date of Service: 10/16/2016 8:15 AM Medical  Record Number: [id number] Patient Account Number: [id number] Date of Birth/Sex: 07-13-1966 [age] y.o. Male) Treating RN: Clover MealyAfful, RN, BSN, Rita Primary Care Physician: Angus PalmsGEORGE, SIONNE Other Clinician: Referring Physician: Angus PalmsGEORGE, SIONNE Treating Physician/Extender: Kathreen Cosieroulter, Leah Weeks in Treatment: 0 Fall Risk Assessment Items Have you had 2 or more falls in the last 12 monthso 0 No Have you had any fall that resulted in injury in the last 12 monthso 0 No FALL RISK ASSESSMENT: History of falling - immediate or within 3 months 0 No Secondary diagnosis 0 No Ambulatory aid None/bed rest/wheelchair/nurse 0 Yes Crutches/cane/walker 0 No Furniture 0 No IV Access/Saline Lock 0 No  Gait/Training Normal/bed rest/immobile 0 Yes Weak 0 No Impaired 20 Yes Mental Status Oriented to own ability 0 Yes Electronic Signature(s) Signed: 10/16/2016 8:11:24 AM By: Elpidio EricAfful, Rita BSN, RN Entered By: Elpidio EricAfful, Rita on 10/16/2016 08:11:24 [name], [name] ([id number]) -------------------------------------------------------------------------------- Foot Assessment Details Patient Name: [name], [name] Date of Service: 10/16/2016 8:15 AM Medical Record Number: [id number] Patient Account Number: [id number] Date of Birth/Sex: [birth year] [age] y.o. Male) Treating RN: Clover MealyAfful, RN, BSN,  [place] Primary Care Physician: Angus PalmsGEORGE, SIONNE Other Clinician: Referring Physician: Angus PalmsGEORGE, SIONNE Treating Physician/Extender: Kathreen Cosieroulter, Leah Weeks in Treatment: 0 Foot Assessment Items Site Locations + = Sensation present, - = Sensation absent, C = Callus, U = Ulcer R = Redness, W = Warmth, M = Maceration, PU = Pre-ulcerative lesion F = Fissure, S = Swelling, D = Dryness Assessment Right: Left: Other Deformity: No No Prior Foot Ulcer: No No Prior Amputation: No No Charcot Joint: No No Ambulatory Status: Ambulatory Without Help Gait: Steady Electronic Signature(s) Signed: 10/16/2016 8:11:40 AM By: Elpidio EricAfful, Rita BSN, RN Entered  By: Elpidio EricAfful, Rita on 10/16/2016 08:11:40 [place], [name] ([id number]) -------------------------------------------------------------------------------- Nutrition Risk Assessment Details Patient Name: [name], [name] Date of Service: 10/16/2016 8:15 AM Medical Record Number: [id number] Patient Account Number: [id number] Date of Birth/Sex: [birth year] [age] y.o. Male) Treating RN: Clover MealyAfful, RN, BSN, Rita Primary Care Physician: Angus PalmsGEORGE, SIONNE Other Clinician: Referring Physician: Angus PalmsGEORGE, SIONNE Treating Physician/Extender: Kathreen Cosieroulter, Leah Weeks in Treatment: 0 Height (in): 66 Weight (lbs): 280 Body Mass Index (BMI): 45.2 Nutrition Risk Assessment Items NUTRITION RISK SCREEN: I have an illness or condition that made me change the kind and/or 0 No amount of food I eat I eat fewer than two meals per day 0 No I eat few fruits and vegetables, or milk products 0 No I have three or more drinks of beer, liquor or wine almost every day 0 No I have tooth or mouth problems that make it hard for me to eat 0 No I don't always have enough money to buy the food I need 0 No I eat alone most of the time 0 No I take three or more different prescribed or over-the-counter drugs a 0 No day Without wanting to, I have lost or gained 10 pounds in the last six 0 No months I am not always physically able to shop, cook and/or feed myself 0 No Nutrition Protocols Good Risk Protocol 0 No interventions needed Moderate Risk Protocol Electronic Signature(s) Signed: 10/16/2016 8:11:30 AM By: Elpidio EricAfful, Rita BSN, RN Entered By: Elpidio EricAfful, Rita on 10/16/2016 08:11:30

## 2016-10-18 NOTE — Progress Notes (Addendum)
OLNEY, MONIER ([id number]) Visit Report for 10/16/2016 Chief Complaint Document Details Patient Name: [name], [name] Date of Service: 10/16/2016 8:15 AM Medical Record Number: [id number] Patient Account Number: [id number] Date of Birth/Sex: [birth year] (50 y.o. Male) Treating RN: Clover Mealy, RN, BSN, [place] Sink Primary Care Physician: Angus Palms Other Clinician: Referring Physician: Angus Palms Treating Physician/Extender: Kathreen Cosier in Treatment: 0 Information Obtained from: Patient Chief Complaint Patients presents for treatment of an open diabetic ulcer and pressure injury to his left popliteal fossa, in the presence of a left leg prosthesis. Electronic Signature(s) Signed: 10/16/2016 9:05:59 AM By: Penne Lash, NP, Reggie Pile By: Penne Lash, NP, Jakaya Jacobowitz on 10/16/2016 09:05:59 Reagle, [name] ([id number]) -------------------------------------------------------------------------------- Debridement Details Patient Name: Richard Kyle A. Date of Service: 10/16/2016 8:15 AM Medical Record Number: [id number] Patient Account Number: [id number] Date of Birth/Sex: 10-07-1966 (50 y.o. Male) Treating RN: Clover Mealy, RN, BSN, [place] Sink Primary Care Physician: Angus Palms Other Clinician: Referring Physician: Angus Palms Treating Physician/Extender: Kathreen Cosier in Treatment: 0 Debridement Performed for Wound #8 Left,Posterior Amputation Site - Above Knee Assessment: Performed By: Physician Richard Public, NP Debridement: Open Wound/Selective Debridement Selective Description: Pre-procedure Yes - 08:44 Verification/Time Out Taken: Start Time: 08:44 Pain Control: Lidocaine 4% Topical Solution Level: Non-Viable Tissue Total Area Debrided (L x 3.5 (cm) x 10 (cm) = 35 (cm) W): Tissue and other Non-Viable, Fibrin/Slough material debrided: Instrument: Curette Bleeding: Minimum Hemostasis Achieved: Pressure End Time: 08:47 Procedural Pain: 0 Post Procedural Pain:  0 Response to Treatment: Procedure was tolerated well Post Debridement Measurements of Total Wound Length: (cm) 3.5 Width: (cm) 10 Depth: (cm) 0.2 Volume: (cm) 5.498 Character of Wound/Ulcer Post Stable Debridement: Severity of Tissue Post Debridement: Fat layer exposed Post Procedure Diagnosis Same as Pre-procedure Electronic Signature(s) Signed: 10/23/2016 11:51:22 AM By: Penne Lash, NP, [name] Signed: 11/14/2016 5:29:30 PM By: Elpidio Eric BSN, RN Eddie, Jahaad AMarland Kitchen ([id number]) Previous Signature: 10/16/2016 9:05:13 AM Version By: Penne Lash, NP, Aubri Gathright Previous Signature: 10/17/2016 12:03:08 PM Version By: Elpidio Eric BSN, RN Entered By: Penne Lash, NP, Tanicia Wolaver on 10/23/2016 11:51:22 Encina, Marcelles A. ([id number]) -------------------------------------------------------------------------------- HPI Details Patient Name: Richard Kyle A. Date of Service: 10/16/2016 8:15 AM Medical Record Number: [id number] Patient Account Number: [id number] Date of Birth/Sex: 07/30/1966 (50 y.o. Male) Treating RN: Clover Mealy, RN, BSN, [place] Sink Primary Care Physician: Angus Palms Other Clinician: Referring Physician: Angus Palms Treating Physician/Extender: Kathreen Cosier in Treatment: 0 History of Present Illness Location: left lower thigh posteriorly Quality: Patient reports experiencing a dull pain to affected area(s). Severity: Patient states wound are getting worse. Duration: Patient has had the wound for > 11 months prior to seeking treatment at the wound center Timing: Pain in wound is Intermittent (comes and goes Context: The wound occurred when the patient has been wearing a prosthesis on his left lower extremity for a left BKA Modifying Factors: Other treatment(s) tried include:working with the prosthetic and orthopedic department at [facility] Associated Signs and Symptoms: Patient reports having foul odor. HPI Description: 10-16-2016; [name] is a 50 year old male who presents  today for follow-up on his diabetic ulcers. It has been greater than 30 days since his last visit here less making him a new patient. He has a history of type 2 diabetes, hypertension, CHF, hyperlipidemia, MI, coronary angioplasty with stent, hypothyroidism, CAD, ischemic cardiomyopathy, CKG stage IV, obesity, peripheral neuropathy, PVD, sleep apnea with CPAP use, tracheostomy. He is status post left BKA in 2012 with multiple debridements and a revision in 2014. He is a long-standing history with complications with the  left leg prosthesis, being ill-fated and aggravating the ulcer to his posterior left leg. Most of his care has been performed at [facility]. According to documentation he continued to wear this prosthesis AGAINST MEDICAL ADVICE. According to [name] he does have a new [place] but he states that he cannot get hold of the orthotist for fitting, he was strongly encouraged to continue contact with this company to get his new prosthesis. It appears that he had a fall at work and went to the emergency department on 09/12/2016, since then he states that he has continued to work, wearing the ill-fitted prosthesis. He is the sole provider for his family. A recent follow-up appointment at [facility] a POC A1c was performed on 06/20/2013 and it was 7.9%, prior to that in 12/25/2015 it was 7.8%. He states he has been doing all of his dressing changes with silver alginate. He denies and there is no record of any radiographic studies or vascular studies to the left leg. He is currently on no antibiotic therapy. Electronic Signature(s) Signed: 10/16/2016 9:27:25 AM By: Penne Lash, NP, Reggie Pile By: Penne Lash, NP, Margreat Widener on 10/16/2016 16:10:96 Idamae Schuller ([id number]) -------------------------------------------------------------------------------- Physical Exam Details Patient Name: Richard Kyle A. Date of Service: 10/16/2016 8:15 AM Medical Record  Number: [id number] Patient Account Number: [id number] Date of Birth/Sex: Jun 29, 1966 (50 y.o. Male) Treating RN: Clover Mealy, RN, BSN, [place] Sink Primary Care Physician: Angus Palms Other Clinician: Referring Physician: Angus Palms Treating Physician/Extender: Kathreen Cosier in Treatment: 0 Constitutional hypertensive. afebrile. Respiratory non-labored respiratory effort. Musculoskeletal ambulates with prosthesis to left leg otherwise has no ambulatory assistive devices. Integumentary (Hair, Skin) left popliteal fossa is an area of red tissue, nongranular with macerated edges. There is a faint odor after cleaning; left BKA stump anteriorly with a small open area with serosanguineous drainage. no induration, no fluctuance, denies pain. Psychiatric seems passive and not completely comprehending the severity of condition. oriented to time, place, person and situation. calm, pleasant, conversive. Electronic Signature(s) Signed: 10/16/2016 9:32:50 AM By: Penne Lash, NP, Victoria Euceda Previous Signature: 10/16/2016 9:30:30 AM Version By: Penne Lash, NP, Reggie Pile By: Penne Lash, NP, Prentis Langdon on 10/16/2016 09:32:50 Brosky, [name] ([id number]) -------------------------------------------------------------------------------- Physician Orders Details Patient Name: Richard Kyle A. Date of Service: 10/16/2016 8:15 AM Medical Record Number: [id number] Patient Account Number: [id number] Date of Birth/Sex: [birth year] (50 y.o. Male) Treating RN: Clover Mealy, RN, BSN,  Sink Primary Care Physician: Angus Palms Other Clinician: Referring Physician: Angus Palms Treating Physician/Extender: Kathreen Cosier in Treatment: 0 Verbal / Phone Orders: Yes Clinician: Afful, RN, BSN, Rita Read Back and Verified: Yes Diagnosis Coding Wound Cleansing Wound #8 Left,Posterior Amputation Site - Above Knee o Cleanse wound with mild soap and water o May Shower, gently pat wound dry prior to applying new dressing. o  May shower with protection. Wound #9 Left,Anterior Amputation Site - Below Knee o Cleanse wound with mild soap and water o May Shower, gently pat wound dry prior to applying new dressing. o May shower with protection. Skin Barriers/Peri-Wound Care Wound #8 Left,Posterior Amputation Site - Above Knee o Barrier cream Wound #9 Left,Anterior Amputation Site - Below Knee o Barrier cream Primary Wound Dressing Wound #8 Left,Posterior Amputation Site - Above Knee o Aquacel Ag - Mediplex Ag or equivalent Wound #9 Left,Anterior Amputation Site - Below Knee o Aquacel Ag - Mediplex Ag or equivalent Secondary Dressing Wound #8 Left,Posterior Amputation Site - Above Knee o Gauze and Kerlix/Conform Wound #9 Left,Anterior Amputation Site - Below Knee o Gauze and  Kerlix/Conform Dressing Change Frequency Wound #8 Left,Posterior Amputation Site - Above Knee o Change dressing every day. Radloff, Abhijay A. ([id number]) Wound #9 Left,Anterior Amputation Site - Below Knee o Change dressing every day. Follow-up Appointments Wound #8 Left,Posterior Amputation Site - Above Knee o Return Appointment in 2 weeks. Wound #9 Left,Anterior Amputation Site - Below Knee o Return Appointment in 2 weeks. Additional Orders / Instructions Wound #8 Left,Posterior Amputation Site - Above Knee o Increase protein intake. o Activity as tolerated o Other: - Vit C, A, Zinc Wound #9 Left,Anterior Amputation Site - Below Knee o Increase protein intake. o Activity as tolerated o Other: - Vit C, A, Zinc Electronic Signature(s) Signed: 10/17/2016 8:33:28 AM By: Penne Lash, NP, Graciana Sessa Signed: 10/17/2016 12:03:08 PM By: Elpidio Eric BSN, RN Entered By: Elpidio Eric on 10/16/2016 08:47:12 Campus, Rita A. ([id number]) -------------------------------------------------------------------------------- Problem List Details Patient Name: Richard Kyle A. Date of Service: 10/16/2016 8:15  AM Medical Record Number: [id number] Patient Account Number: [id number] Date of Birth/Sex: [birth year] (50 y.o. Male) Treating RN: Clover Mealy, RN, BSN, [place] Sink Primary Care Physician: Angus Palms Other Clinician: Referring Physician: Angus Palms Treating Physician/Extender: Kathreen Cosier in Treatment: 0 Active Problems ICD-10 Encounter Code Description Active Date Diagnosis E11.622 Type 2 diabetes mellitus with other skin ulcer 10/16/2016 Yes 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, stage 3 10/16/2016 Yes Inactive Problems Resolved Problems Electronic Signature(s) Signed: 10/16/2016 9:05:03 AM By: Penne Lash, NP, Carlon Chaloux Entered By: Penne Lash, NP, Dawnell Bryant on 10/16/2016 09:05:03 Zaring, [name] ([id number]) -------------------------------------------------------------------------------- Progress Note Details Patient Name: Richard Kyle A. Date of Service: 10/16/2016 8:15 AM Medical Record Number: [id number] Patient Account Number: [id number] Date of Birth/Sex: 1966/10/08 (50 y.o. Male) Treating RN: Clover Mealy, RN, BSN, [place] Sink Primary Care Physician: Angus Palms Other Clinician: Referring Physician: Angus Palms Treating Physician/Extender: Kathreen Cosier in Treatment: 0 Subjective Chief Complaint Information obtained from Patient Patients presents for treatment of an open diabetic ulcer and pressure injury to his left popliteal fossa, in the presence of a left leg prosthesis. History of Present Illness (HPI) The following HPI elements were documented for the patient's wound: Location: left lower thigh posteriorly Quality: Patient reports experiencing a dull pain to affected area(s). Severity: Patient states wound are getting worse. Duration: Patient has had the wound for > 11 months prior to seeking treatment at the wound center Timing: Pain in wound is Intermittent (comes and goes Context: The wound occurred when the patient has been wearing a prosthesis on his left lower  extremity for a left BKA Modifying Factors: Other treatment(s) tried include:working with the prosthetic and orthopedic department at [facility] Associated Signs and Symptoms: Patient reports having foul odor. 10-16-2016; Mr. Weaver is a 50 year old male who presents today for follow-up on his diabetic ulcers. It has been greater than 30 days since his last visit here less making him a new patient. He has a history of type 2 diabetes, hypertension, CHF, hyperlipidemia, MI, coronary angioplasty with stent, hypothyroidism, CAD, ischemic cardiomyopathy, CKG stage IV, obesity, peripheral neuropathy, PVD, sleep apnea with CPAP use, tracheostomy. He is status post left BKA in 2012 with multiple debridements and a revision in 2014. He is a long-standing history with complications with the left leg prosthesis, being ill-fated and aggravating the ulcer to his posterior left leg. Most of his care has been performed at [facility]. According to documentation he continued to wear this prosthesis AGAINST MEDICAL ADVICE. According to [name] he does have a new prosthesis that is in the [place] office but he states that  he cannot get hold of the orthotist for fitting, he was strongly encouraged to continue contact with this company to get his new prosthesis. It appears that he had a fall at work and went to the emergency department on 09/12/2016, since then he states that he has continued to work, wearing the ill-fitted prosthesis. He is the sole provider for his family. A recent follow-up appointment at [facility] a POC A1c was performed on 06/20/2013 and it was 7.9%, prior to that in 12/25/2015 it was 7.8%. He states he has been doing all of his dressing changes with silver alginate. He denies and there is no record of any radiographic studies or vascular studies to the left leg. He is currently on no antibiotic therapy. Wound History Molenda, [name] ([id number]) Patient presents with 2 open wounds  that have been present for approximately years. Patient has been treating wounds in the following manner: aq ag. Laboratory tests have not been performed in the last month. Patient reportedly has not tested positive for an antibiotic resistant organism. Patient reportedly has not tested positive for osteomyelitis. Patient reportedly has not had testing performed to evaluate circulation in the legs. Patient experiences the following problems associated with their wounds: infection. Patient History Information obtained from Patient. Allergies amitriptyline (Severity: Severe, Reaction: nausea), Pravachol (Severity: Severe, Reaction: muscles ache and elevated CK), Septra (Severity: Severe, Reaction: mouth ulcers), ACE Inhibitors (Severity: Severe, Reaction: increases creatinine), lovastatin (Severity: Severe, Reaction: myalgia), adhesive (Severity: Severe, Reaction: rash), Iodinated Contrast Media - Oral and IV Dye (Severity: Severe, Reaction: kidney disorder), Tree Nuts (Severity: Severe, Reaction: anaphalaxis) Family History Hypertension - Mother, No family history of Cancer, Diabetes, Heart Disease, Hereditary Spherocytosis, Kidney Disease, Lung Disease, Seizures, Stroke, Thyroid Problems, Tuberculosis. Social History Never smoker, Marital Status - Married, Alcohol Use - Never, Drug Use - No History, Caffeine Use - Never. Medical History Gastrointestinal Denies history of Cirrhosis , Colitis, Crohn s, Hepatitis A, Hepatitis B, Hepatitis C Genitourinary Denies history of End Stage Renal Disease Immunological Denies history of Lupus Erythematosus, Raynaud s, Scleroderma Oncologic Denies history of Received Chemotherapy Medical And Surgical History Notes Cardiovascular heart murmur, hx cardiomyopathy Genitourinary hx of kidney failure and hx of dialysis Psychiatric hx depression Review of Systems (ROS) Eyes The patient has no complaints or symptoms. Ear/Nose/Mouth/Throat The  patient has no complaints or symptoms. Hematologic/Lymphatic The patient has no complaints or symptoms. Respiratory Meldrum, Del A. ([id number]) The patient has no complaints or symptoms. Cardiovascular The patient has no complaints or symptoms. Endocrine The patient has no complaints or symptoms. Genitourinary The patient has no complaints or symptoms. Immunological The patient has no complaints or symptoms. Integumentary (Skin) The patient has no complaints or symptoms. Musculoskeletal The patient has no complaints or symptoms. Neurologic The patient has no complaints or symptoms. Oncologic The patient has no complaints or symptoms. Psychiatric The patient has no complaints or symptoms. Objective Constitutional hypertensive. afebrile. Vitals Time Taken: 8:18 AM, Temperature: 98.1 F, Pulse: 72 bpm, Respiratory Rate: 18 breaths/min, Blood Pressure: 150/90 mmHg. Respiratory non-labored respiratory effort. Musculoskeletal ambulates with prosthesis to left leg otherwise has no ambulatory assistive devices. Psychiatric seems passive and not completely comprehending the severity of condition. oriented to time, place, person and situation. calm, pleasant, conversive. Integumentary (Hair, Skin) left popliteal fossa is an area of red tissue, nongranular with macerated edges. There is a faint odor after cleaning; left BKA stump anteriorly with a small open area with serosanguineous drainage. no induration, no fluctuance, denies pain. Pucciarelli, Rasmus A. ([id number])  Wound #8 status is Open. Original cause of wound was Pressure Injury. The wound is located on the Left,Posterior Amputation Site - Above Knee. The wound measures 3.5cm length x 10cm width x 0.2cm depth; 27.489cm^2 area and 5.498cm^3 volume. The wound is limited to skin breakdown. There is no tunneling or undermining noted. There is a large amount of serosanguineous drainage noted. The wound margin is distinct with  the outline attached to the wound base. There is large (67-100%) red, pink granulation within the wound bed. There is no necrotic tissue within the wound bed. The periwound skin appearance exhibited: Maceration, Moist. The periwound skin appearance did not exhibit: Callus, Crepitus, Excoriation, Fluctuance, Friable, Induration, Localized Edema, Rash, Scarring, Dry/Scaly, Atrophie Blanche, Cyanosis, Ecchymosis, Hemosiderin Staining, Mottled, Pallor, Rubor, Erythema. Periwound temperature was noted as No Abnormality. Wound #9 status is Open. Original cause of wound was Pressure Injury. The wound is located on the Left,Anterior Amputation Site - Below Knee. The wound measures 0.5cm length x 0.5cm width x 0.1cm depth; 0.196cm^2 area and 0.02cm^3 volume. The wound is limited to skin breakdown. There is no tunneling or undermining noted. There is a medium amount of serosanguineous drainage noted. The wound margin is distinct with the outline attached to the wound base. There is large (67-100%) pink, pale granulation within the wound bed. There is no necrotic tissue within the wound bed. The periwound skin appearance exhibited: Moist. The periwound skin appearance did not exhibit: Callus, Crepitus, Excoriation, Fluctuance, Friable, Induration, Localized Edema, Rash, Scarring, Dry/Scaly, Maceration, Atrophie Blanche, Cyanosis, Ecchymosis, Hemosiderin Staining, Mottled, Pallor, Rubor, Erythema. Periwound temperature was noted as No Abnormality. Assessment Active Problems ICD-10 E11.622 - Type 2 diabetes mellitus with other skin ulcer L89.93 - Pressure ulcer of unspecified site, stage 3 Procedures Wound #8 Wound #8 is a Diabetic Wound/Ulcer of the Lower Extremity located on the Left,Posterior Amputation Site - Above Knee . There was a Non-Viable Tissue Open Wound/Selective [phone]) debridement with total area of 35 sq cm performed by Richard Publicoulter, Ayren Zumbro, NP. with the following instrument(s): Curette to  remove Non- Viable tissue/material including Fibrin/Slough after achieving pain control using Lidocaine 4% Topical Solution. A time out was conducted at 08:44, prior to the start of the procedure. A Minimum amount of bleeding was controlled with Pressure. The procedure was tolerated well with a pain level of 0 throughout and a pain level of 0 following the procedure. Post Debridement Measurements: 3.5cm length x 10cm width x 0.2cm depth; 5.498cm^3 volume. Westbrook, Eriverto A. ([id number]) Character of Wound/Ulcer Post Debridement is stable. Severity of Tissue Post Debridement is: Fat layer exposed. Post procedure Diagnosis Wound #8: Same as Pre-Procedure Plan Wound Cleansing: Wound #8 Left,Posterior Amputation Site - Above Knee: Cleanse wound with mild soap and water May Shower, gently pat wound dry prior to applying new dressing. May shower with protection. Wound #9 Left,Anterior Amputation Site - Below Knee: Cleanse wound with mild soap and water May Shower, gently pat wound dry prior to applying new dressing. May shower with protection. Skin Barriers/Peri-Wound Care: Wound #8 Left,Posterior Amputation Site - Above Knee: Barrier cream Wound #9 Left,Anterior Amputation Site - Below Knee: Barrier cream Primary Wound Dressing: Wound #8 Left,Posterior Amputation Site - Above Knee: Aquacel Ag - Mediplex Ag or equivalent Wound #9 Left,Anterior Amputation Site - Below Knee: Aquacel Ag - Mediplex Ag or equivalent Secondary Dressing: Wound #8 Left,Posterior Amputation Site - Above Knee: Gauze and Kerlix/Conform Wound #9 Left,Anterior Amputation Site - Below Knee: Gauze and Kerlix/Conform Dressing Change Frequency: Wound #8 Left,Posterior  Amputation Site - Above Knee: Change dressing every day. Wound #9 Left,Anterior Amputation Site - Below Knee: Change dressing every day. Follow-up Appointments: Wound #8 Left,Posterior Amputation Site - Above Knee: Return Appointment in 2  weeks. Wound #9 Left,Anterior Amputation Site - Below Knee: Return Appointment in 2 weeks. Additional Orders / Instructions: Wound #8 Left,Posterior Amputation Site - Above Knee: Increase protein intake. Activity as tolerated Other: - Vit C, A, Zinc Arlen, [place] A. ([id number]) Wound #9 Left,Anterior Amputation Site - Below Knee: Increase protein intake. Activity as tolerated Other: - Vit C, A, Zinc Follow-Up Appointments: A Patient Clinical Summary of Care was provided to DP 1. Continue with dressing changes 2. Follow-up with orthotist regarding Prosthesis to left flank 3. Follow-up in 2 weeks Electronic Signature(s) Signed: 10/23/2016 11:51:56 AM By: Penne Lash, NP, Fleda Pagel Previous Signature: 10/16/2016 9:32:59 AM Version By: Penne Lash, NP, Kylyn Sookram Previous Signature: 10/16/2016 9:31:42 AM Version By: Penne Lash, NP, Zaedyn Covin Entered By: Penne Lash, NP, Yash Cacciola on 10/23/2016 11:51:56 Mcinerny, [place] A. ([id number]) -------------------------------------------------------------------------------- ROS/PFSH Details Patient Name: Richard Kyle A. Date of Service: 10/16/2016 8:15 AM Medical Record Number: [id number] Patient Account Number: [id number] Date of Birth/Sex: 10-11-1966 (50 y.o. Male) Treating RN: Clover Mealy, RN, BSN, Rita Primary Care Physician: Angus Palms Other Clinician: Referring Physician: Angus Palms Treating Physician/Extender: Kathreen Cosier in Treatment: 0 Information Obtained From Patient Wound History Do you currently have one or more open woundso Yes How many open wounds do you currently haveo 2 Approximately how long have you had your woundso years How have you been treating your wound(s) until nowo aq ag Has your wound(s) ever healed and then re-openedo No Have you had any lab work done in the past montho No Have you tested positive for an antibiotic resistant organism (MRSA, VRE)o No Have you tested positive for osteomyelitis (bone infection)o No Have you had  any tests for circulation on your legso No Have you had other problems associated with your woundso Infection Eyes Complaints and Symptoms: No Complaints or Symptoms Medical History: Negative for: Cataracts; Glaucoma; Optic Neuritis Ear/Nose/Mouth/Throat Complaints and Symptoms: No Complaints or Symptoms Medical History: Positive for: Chronic sinus problems/congestion Negative for: Middle ear problems Hematologic/Lymphatic Complaints and Symptoms: No Complaints or Symptoms Medical History: Positive for: Anemia Respiratory Valliant, Jalynn A. ([id number]) Complaints and Symptoms: No Complaints or Symptoms Medical History: Positive for: Asthma; Sleep Apnea Cardiovascular Complaints and Symptoms: No Complaints or Symptoms Medical History: Positive for: Coronary Artery Disease; Hypertension; Myocardial Infarction - huge hx pt has had 4 Negative for: Vasculitis Past Medical History Notes: heart murmur, hx cardiomyopathy Gastrointestinal Medical History: Negative for: Cirrhosis ; Colitis; Crohnos; Hepatitis A; Hepatitis B; Hepatitis C Endocrine Complaints and Symptoms: No Complaints or Symptoms Medical History: Positive for: Type II Diabetes Time with diabetes: 30 years Treated with: Insulin Blood sugar tested every day: Yes Tested : 4 times a day Genitourinary Complaints and Symptoms: No Complaints or Symptoms Medical History: Negative for: End Stage Renal Disease Past Medical History Notes: hx of kidney failure and hx of dialysis Immunological Complaints and Symptoms: No Complaints or Symptoms Medical History: Negative for: Lupus Erythematosus; Raynaudos; Scleroderma Heffelfinger, Kale A. ([id number]) Integumentary (Skin) Complaints and Symptoms: No Complaints or Symptoms Medical History: Positive for: History of pressure wounds - prosthetic injury Negative for: History of Burn Musculoskeletal Complaints and Symptoms: No Complaints or Symptoms Medical  History: Positive for: Gout Neurologic Complaints and Symptoms: No Complaints or Symptoms Medical History: Positive for: Neuropathy Oncologic Complaints and Symptoms: No Complaints or Symptoms Medical History: Negative for:  Received Chemotherapy Psychiatric Complaints and Symptoms: No Complaints or Symptoms Medical History: Negative for: Anorexia/bulimia; Confinement Anxiety Past Medical History Notes: hx depression HBO Extended History Items Ear/Nose/Mouth/Throat: Chronic sinus problems/congestion Family and Social History Cancer: No; Diabetes: No; Heart Disease: No; Hereditary Spherocytosis: No; Hypertension: Yes - Mother; Kidney Disease: No; Lung Disease: No; Seizures: No; Stroke: No; Thyroid Problems: No; Tuberculosis: No; Never smoker; Marital Status - Married; Alcohol Use: Never; Drug Use: No History; Caffeine Use: Never; Single, Sheamus A. ([id number]) Financial Concerns: No; Food, Clothing or Shelter Needs: No; Support System Lacking: No; Transportation Concerns: No; Advanced Directives: No; Patient does not want information on Advanced Directives; Do not resuscitate: No; Living Will: No; Medical Power of Attorney: No Electronic Signature(s) Signed: 10/17/2016 8:33:28 AM By: Penne Lashoulter, NP, Rozalia Dino Signed: 10/17/2016 12:03:08 PM By: Elpidio EricAfful, Rita BSN, RN Entered By: Elpidio EricAfful, Rita on 10/16/2016 08:10:00 Prim, Llewelyn A. ([id number]) -------------------------------------------------------------------------------- SuperBill Details Patient Name: Richard KylePHILLIPS, Laith A. Date of Service: 10/16/2016 Medical Record Number: [id number] Patient Account Number: [id number] Date of Birth/Sex: Feb 16, 1966 [age] y.o. 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, stage 3 Facility Procedures CPT4 Code: [id number] Description: [id number] - DEBRIDE WOUND 1ST 20 SQ CM OR < ICD-10 Description Diagnosis E11.622 Type 2 diabetes mellitus with other skin ulcer 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, stage 3 Modifier: Quantity: 1 CPT4 Code: [id number] Description: 97598 - DEBRIDE WOUND EA ADDL 20 SQ CM ICD-10 Description Diagnosis E11.622 Type 2 diabetes mellitus with other skin ulcer 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 ([id number]) Modifier: Quantity: 1 Electronic Signature(s) Signed: 10/23/2016 11:52:22 AM By: Penne Lashoulter, NP, Mac Dowdell Previous Signature: 10/16/2016 9:33:22 AM Version By: Penne Lashoulter, NP, Donyale Berthold Entered By: Penne Lashoulter, NP, Nadie Fiumara on 10/23/2016 11:52:22

## 2016-10-20 DIAGNOSIS — G4733 Obstructive sleep apnea (adult) (pediatric): Secondary | ICD-10-CM | POA: Diagnosis not present

## 2016-10-21 DIAGNOSIS — E65 Localized adiposity: Secondary | ICD-10-CM | POA: Diagnosis not present

## 2016-10-21 DIAGNOSIS — E119 Type 2 diabetes mellitus without complications: Secondary | ICD-10-CM | POA: Diagnosis not present

## 2016-10-21 DIAGNOSIS — Z794 Long term (current) use of insulin: Secondary | ICD-10-CM | POA: Diagnosis not present

## 2016-11-12 DIAGNOSIS — L97919 Non-pressure chronic ulcer of unspecified part of right lower leg with unspecified severity: Secondary | ICD-10-CM | POA: Diagnosis not present

## 2016-11-13 ENCOUNTER — Ambulatory Visit: Payer: Self-pay | Admitting: Surgery

## 2016-11-14 ENCOUNTER — Ambulatory Visit: Payer: Self-pay | Admitting: Surgery

## 2016-11-27 ENCOUNTER — Ambulatory Visit: Payer: Self-pay | Admitting: Surgery

## 2016-11-27 DIAGNOSIS — E119 Type 2 diabetes mellitus without complications: Secondary | ICD-10-CM | POA: Diagnosis not present

## 2016-11-27 DIAGNOSIS — Z794 Long term (current) use of insulin: Secondary | ICD-10-CM | POA: Diagnosis not present

## 2016-11-27 DIAGNOSIS — E65 Localized adiposity: Secondary | ICD-10-CM | POA: Diagnosis not present

## 2016-12-08 ENCOUNTER — Encounter (INDEPENDENT_AMBULATORY_CARE_PROVIDER_SITE_OTHER): Payer: PPO | Admitting: Ophthalmology

## 2016-12-08 DIAGNOSIS — H43813 Vitreous degeneration, bilateral: Secondary | ICD-10-CM | POA: Diagnosis not present

## 2016-12-08 DIAGNOSIS — E113513 Type 2 diabetes mellitus with proliferative diabetic retinopathy with macular edema, bilateral: Secondary | ICD-10-CM

## 2016-12-08 DIAGNOSIS — H35033 Hypertensive retinopathy, bilateral: Secondary | ICD-10-CM | POA: Diagnosis not present

## 2016-12-08 DIAGNOSIS — H4311 Vitreous hemorrhage, right eye: Secondary | ICD-10-CM | POA: Diagnosis not present

## 2016-12-08 DIAGNOSIS — H2513 Age-related nuclear cataract, bilateral: Secondary | ICD-10-CM

## 2016-12-08 DIAGNOSIS — H35372 Puckering of macula, left eye: Secondary | ICD-10-CM | POA: Diagnosis not present

## 2016-12-08 DIAGNOSIS — E11311 Type 2 diabetes mellitus with unspecified diabetic retinopathy with macular edema: Secondary | ICD-10-CM | POA: Diagnosis not present

## 2016-12-08 DIAGNOSIS — I10 Essential (primary) hypertension: Secondary | ICD-10-CM

## 2016-12-18 ENCOUNTER — Other Ambulatory Visit: Payer: Self-pay | Admitting: Specialist

## 2016-12-18 DIAGNOSIS — S39012A Strain of muscle, fascia and tendon of lower back, initial encounter: Secondary | ICD-10-CM

## 2016-12-24 DIAGNOSIS — Z89512 Acquired absence of left leg below knee: Secondary | ICD-10-CM | POA: Diagnosis not present

## 2016-12-24 DIAGNOSIS — Z4781 Encounter for orthopedic aftercare following surgical amputation: Secondary | ICD-10-CM | POA: Diagnosis not present

## 2016-12-26 ENCOUNTER — Encounter: Payer: PPO | Attending: Surgery | Admitting: Surgery

## 2016-12-26 DIAGNOSIS — D649 Anemia, unspecified: Secondary | ICD-10-CM | POA: Diagnosis not present

## 2016-12-26 DIAGNOSIS — Z6841 Body Mass Index (BMI) 40.0 and over, adult: Secondary | ICD-10-CM | POA: Insufficient documentation

## 2016-12-26 DIAGNOSIS — J45909 Unspecified asthma, uncomplicated: Secondary | ICD-10-CM | POA: Insufficient documentation

## 2016-12-26 DIAGNOSIS — L97919 Non-pressure chronic ulcer of unspecified part of right lower leg with unspecified severity: Secondary | ICD-10-CM | POA: Diagnosis not present

## 2016-12-26 DIAGNOSIS — S81002S Unspecified open wound, left knee, sequela: Secondary | ICD-10-CM | POA: Diagnosis not present

## 2016-12-26 DIAGNOSIS — G4733 Obstructive sleep apnea (adult) (pediatric): Secondary | ICD-10-CM | POA: Diagnosis not present

## 2016-12-26 DIAGNOSIS — X58XXXS Exposure to other specified factors, sequela: Secondary | ICD-10-CM | POA: Diagnosis not present

## 2016-12-26 DIAGNOSIS — E1140 Type 2 diabetes mellitus with diabetic neuropathy, unspecified: Secondary | ICD-10-CM | POA: Insufficient documentation

## 2016-12-26 DIAGNOSIS — E11622 Type 2 diabetes mellitus with other skin ulcer: Secondary | ICD-10-CM | POA: Diagnosis not present

## 2016-12-26 DIAGNOSIS — Z79899 Other long term (current) drug therapy: Secondary | ICD-10-CM | POA: Diagnosis not present

## 2016-12-26 DIAGNOSIS — I252 Old myocardial infarction: Secondary | ICD-10-CM | POA: Insufficient documentation

## 2016-12-26 DIAGNOSIS — Z794 Long term (current) use of insulin: Secondary | ICD-10-CM | POA: Diagnosis not present

## 2016-12-26 DIAGNOSIS — M70862 Other soft tissue disorders related to use, overuse and pressure, left lower leg: Secondary | ICD-10-CM | POA: Insufficient documentation

## 2016-12-26 DIAGNOSIS — M109 Gout, unspecified: Secondary | ICD-10-CM | POA: Diagnosis not present

## 2016-12-26 DIAGNOSIS — I10 Essential (primary) hypertension: Secondary | ICD-10-CM | POA: Diagnosis not present

## 2016-12-26 DIAGNOSIS — I2510 Atherosclerotic heart disease of native coronary artery without angina pectoris: Secondary | ICD-10-CM | POA: Diagnosis not present

## 2016-12-26 DIAGNOSIS — L97122 Non-pressure chronic ulcer of left thigh with fat layer exposed: Secondary | ICD-10-CM | POA: Insufficient documentation

## 2016-12-26 DIAGNOSIS — L97821 Non-pressure chronic ulcer of other part of left lower leg limited to breakdown of skin: Secondary | ICD-10-CM | POA: Diagnosis not present

## 2016-12-27 NOTE — Progress Notes (Addendum)
[name] ([id number]) Visit Report for 12/26/2016 Chief Complaint Document Details Patient Name: [name], [name]. Date of Service: 12/26/2016 8:00 AM Medical Record Number: [id number] Patient Account Number: [id number] Date of Birth/Sex: [birth year] (51 y.o. Male) Treating RN: Curtis Sites Primary Care Provider: Angus Palms Other Clinician: Referring Provider: Angus Palms Treating Provider/Extender: Rudene Re in Treatment: 0 Information Obtained from: Patient Chief Complaint the patient returns back to see [place] for his long-standing wounds of the left lower extremity in the region of his popliteal fossa due to pres his prosthesis and this has been an ongoing problem for over 2 years Electronic Signature(s) Signed: 12/26/2016 9:27:32 AM By: Evlyn Kanner MD, FACS Entered By: Evlyn Kanner on 12/26/2016 09:27:32 Guettler, [name] ([id number]) -------------------------------------------------------------------------------- HPI Details Patient Name: [name]. Date of Service: 12/26/2016 8:00 AM Medical Record Number: [id number] Patient Account Number: [id number] Date of Birth/Sex: [birth year] (51 y.o. Male) Treating RN: Curtis Sites Primary Care Provider: Angus Palms Other Clinician: Referring Provider: Angus Palms Treating Provider/Extender: Rudene Re in Treatment: 0 History of Present Illness Location: left lower thigh posteriorly Quality: Patient reports experiencing [name] dull pain to affected area(s). Severity: Patient states wound are getting worse. Duration: Patient has had the wound for > 11 months prior to seeking treatment at the wound center Timing: Pain in wound is Intermittent (comes and goes Context: The wound occurred when the patient has been wearing [name] prosthesis on his left lower extremity for [name] left BKA Modifying Factors: Other treatment(s) tried include:working with the prosthetic and orthopedic department at [facility] Associated Signs and Symptoms: Patient reports having foul odor. HPI Description: 51 year old gentleman who has diabetes mellitus, left below-knee amputation with previous revision in 2014 and mul morbidities including carotid artery disease, cardiac issues nephropathy obesity and peripheral vascular disease comes back to see as ongoing problems with his left below-knee wounds in the region of the popliteal fossa and 1 anteriorly below the cut edge of his tibia. Nothing else has changed in his history except that the prosthesis which was made in September of last year has been discontinued du causing him [name] lot of pressure injuries. He is working on his third prosthesis in the last 2 years. ====================================================================================================== Old notes: 51 year old gentleman with past medical history of diabetes mellitus with last hemoglobin A1c done in February 2016 was 8.7, hyperte hypothyroidism, coronary artery disease, ischemic cardiomyopathy , nephropathy, obesity, peripheral neuropathy, peripheral vascular d and sleep apnea. He is status post coronary angioplasty, amputation of left leg in 2012, multiple debridement of skin and subcutaneous the left BKA site, tracheostomy, revision of his left BKA in 2014. He has had problems with his left BKA prosthesis for [name] long while and had [name] vacuum system and then had to be changed over to [name] slee system and has been working with the orthopedic and prosthetic department at [facility]. He has also had [name] plastic surgery opinion in the The ulceration on the posterior part of his lower thigh continued to be aggravating an enlarging and have [name] foul order and drainage. 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 Lubertha Basque the PA whonoted that  he continue to wear his prosthe against medical advice. details from his past surgical history notes that he's had [name] cardiac catheterization, coronary angioplasty, amput the  left leg in 2012, debridement of skin and subcutis tissue in June 2013, application of wound VAC at that time, below knee amputatio in April 2014. Recommendation was for [name] new socket to be made which should be above the new wound. He also recommended surgical revision of redundant skin and to stay off the prosthesis to the wound heals but the patient would not be able to do this because of work. Hence he advised to establish with [name] local wound clinic for management of his current wound and follow-up with the amputee clinic at [facility] or wit [facility]. If he decided to go back to them for surgery he should see Dr. Lanier Ensign. 03/27/2016 -- he was unable to get his prosthesis made at the place where they had begun to mold and hence he has to find himself [name] prosthetic department and is working on this. 04/10/2016 -- on his left popliteal fossa area he has got [name] small satellite abrasion with an ulceration which was caused by pulling off the his skin. He still has to see his prosthetic department. 05/01/2016 -- today he comes with [name] new problem on his right foot which he says he's had for about 2 months and has been treated by podiatrist Dr. Ovid Curd. Says due to the fact that his footwear and insoles were worn out, he has developed an ulcerated area continues to drain sero- sanguinous fluid and has been on doxycycline for about 2 months. He saw Dr. Ardelle Anton who did an x-ray of th has been following up with him for [name] long while regarding this and managing his Charcot's arthropathy right foot. Today the patient wan take [name] look at it and agrees to undergo treatment for this. 05/08/2016 -- I have reviewed notes from Dr. Vaughan Sine who saw him on 05/06/2016. after review he asked him to continue with wound care and started him on  ciprofloxacin as well as his doxycycline. note is made of the fact that on his previous visit on 02/01/2016 were done which showed chronic arthritic changes present in the rear foot due to Charcot deformity. Vessel occification is present. 06/12/2016 -- the patient has not yet got his left below-knee prostheses sleeve which was to be made afresh. He has also not received foot orthotic shoe which was to be made at Dr. Kenna Gilbert office. It has been over 2 months since he keeps telling me that he is going to done soon. [place], [place] [name]. ([id number]) 07/17/2016 -- shunt has not been to see as in 5 weeks and continues to have issues trying to get his prosthesis for his left below-knee amputation. He has got new superficial abrasion on the medial part of his left knee and this is separate from the large ulceration in the fossa. Did get his inserts for his diabetic shoe for his right foot and this wound is healed. 08/07/2016 -- patient comes after 3 weeks and has [name] new wound on his left knee just over the patella from [name] new prosthesis which she about [name] week. He is back to wearing his old prostheses at present, which he will use still he gets the new one back. 10-16-2016; Mr. Siems is [name] 51 year old male who presents today for follow-up on his diabetic ulcers. It has been greater than 30 days last visit here less making him [name] new patient. He has [name] history of type 2 diabetes, hypertension, CHF, hyperlipidemia, MI, coronary ang with stent, hypothyroidism, CAD, ischemic cardiomyopathy, CKG stage IV, obesity, peripheral neuropathy, PVD, sleep  apnea with CPA tracheostomy. He is status post left BKA in 2012 with multiple debridements and [name] revision in 2014. He is [name] long-standing history with complications with the left leg prosthesis, being ill-fated and aggravating the ulcer to his posterior left leg. Most of his care has been pe [facility]. According to documentation he continued to wear this prosthesis AGAINST  MEDICAL ADVICE. According to Mr. Vear Clockhillips he [place] but he states that he cannot get hold of the orthotist for fitting, he was strongly encouraged contact with this company to get his new prosthesis. It appears that he had [name] fall at work and went to the emergency department on 10 since then he states that he has continued to work, wearing the ill-fitted prosthesis. He is the sole provider for his family. [name] [name] POC A[facility] was performed on 06/20/2013 and it was 7.9%, prior to that in 12/25/2015 it was 7 He states he has been doing all of his dressing changes with silver alginate. He denies and there is no record of any radiographic studi vascular studies to the left leg. He is currently on no antibiotic therapy. ====================================================================================================== Electronic Signature(s) Signed: 12/26/2016 9:30:34 AM By: Evlyn KannerBritto, Abigayl Hor MD, FACS Entered By: Evlyn KannerBritto, Juandaniel Manfredo on 12/26/2016 09:30:34 Bran, Richard SomARREN [name]. ([id number]) -------------------------------------------------------------------------------- Physical Exam Details Patient Name: Richard KylePHILLIPS, Nimesh [name]. Date of Service: 12/26/2016 8:00 AM Medical Record Number: [id number] Patient Account Number: [id number] Date of Birth/Sex: [birth year](50 y.o. Male) Treating RN: Curtis Sitesorthy, Joanna Primary Care Provider: Angus PalmsGEORGE, SIONNE Other Clinician: Referring Provider: Angus PalmsGEORGE, SIONNE Treating Provider/Extender: Rudene ReBritto, Lakevia Perris Weeks in Treatment: 0 Constitutional . Pulse regular. Respirations normal and unlabored. Afebrile. . Eyes Nonicteric. Reactive to light. Ears, Nose, Mouth, and Throat Lips, teeth, and gums WNL.Marland [name]. Moist mucosa without lesions. Neck supple and nontender. No palpable supraclavicular or cervical adenopathy. Normal sized without goiter. Respiratory WNL. No retractions.. Breath sounds WNL, No rubs, rales,  rhonchi, or wheeze.. Cardiovascular Heart rhythm and rate regular, no murmur or gallop.. Pedal Pulses WNL. No clubbing, cyanosis or edema. Chest Breasts symmetical and no nipple discharge.. Breast tissue WNL, no masses, lumps, or tenderness.. Lymphatic No adneopathy. No adenopathy. No adenopathy. Musculoskeletal Adexa without tenderness or enlargement.. Digits and nails w/o clubbing, cyanosis, infection, petechiae, ischemia, or inflammatory con Integumentary (Hair, Skin) No suspicious lesions. No crepitus or fluctuance. No peri-wound warmth or erythema. No masses.Marland [name]. Psychiatric Judgement and insight Intact.. No evidence of depression, anxiety, or agitation.. Notes in the region of the anterior below-knee area where he is currently [name] lot of redundant skin he's got [name] shallow sinus tractdue to pressure suction cup. This is not infected or inflamed. Wound in the popliteal fossa region of his below-knee area has [name] lot of thick callus with rolled in edges and this area is tender but not in He cannot tolerate the debridement. Electronic Signature(s) Signed: 12/26/2016 9:31:32 AM By: Evlyn KannerBritto, Shaurya Rawdon MD, FACS Entered By: Evlyn KannerBritto, Seraj Dunnam on 12/26/2016 09:31:32 Hosmer, Richard SomARREN [name]. ([id number]) -------------------------------------------------------------------------------- Physician Orders Details Patient Name: Richard KylePHILLIPS, Cloyd [name]. Date of Service: 12/26/2016 8:00 AM Medical Record Number: [id number] Patient Account Number: [id number] Date of Birth/Sex: [birth year] 108(50 y.o. Male) Treating RN: Curtis Sitesorthy, Joanna Primary Care Provider: Angus PalmsGEORGE, SIONNE Other Clinician: Referring Provider: Angus PalmsGEORGE, SIONNE Treating Provider/Extender: Rudene ReBritto, Sayeed Weatherall Weeks in Treatment: 0 Verbal / Phone Orders: Yes Clinician: Curtis Sitesorthy, Joanna Read Back and Verified: Yes Diagnosis Coding Wound Cleansing Wound #10 Left,Posterior Amputation Site - Below Knee o Clean wound with Normal Saline. Wound #  11 Left,Anterior Amputation Site -  Below Knee o Clean wound with Normal Saline. Anesthetic Wound #10 Left,Posterior Amputation Site - Below Knee o Topical Lidocaine 4% cream applied to wound bed prior to debridement Wound #11 Left,Anterior Amputation Site - Below Knee o Topical Lidocaine 4% cream applied to wound bed prior to debridement Primary Wound Dressing Wound #10 Left,Posterior Amputation Site - Below Knee o Aquacel Ag Wound #11 Left,Anterior Amputation Site - Below Knee o Aquacel Ag Secondary Dressing Wound #10 Left,Posterior Amputation Site - Below Knee o Conform/Kerlix o Foam Wound #11 Left,Anterior Amputation Site - Below Knee o Conform/Kerlix o Foam Dressing Change Frequency Wound #10 Left,Posterior Amputation Site - Below Knee o Change dressing every other day. Wound #11 Left,Anterior Amputation Site - Below Knee o Change dressing every other day. Follow-up Appointments Wound #10 Left,Posterior Amputation Site - Below Knee o Other: - 1 month Wound #11 Left,Anterior Amputation Site - Below Knee o Other: - 1 month Off-Loading Wound #10 Left,Posterior Amputation Site - Below Knee o Other: - take pressure off your leg as much as possible Everly, [name]. ([id number]) Wound #11 Left,Anterior Amputation Site - Below Knee o Other: - take pressure off your leg as much as possible Additional Orders / Instructions Wound #10 Left,Posterior Amputation Site - Below Knee o Increase protein intake. o Other: - add vitamin c, vitamin [name], and zinc supplements to your diet Wound #11 Left,Anterior Amputation Site - Below Knee o Increase protein intake. o Other: - add vitamin c, vitamin [name], and zinc supplements to your diet Notes Please call [facility] when you are ready for Plastic Surgery consult Electronic Signature(s) Signed: 12/26/2016 3:35:58 PM By: Evlyn Kanner MD, FACS Signed: 12/26/2016 4:09:04 PM By: Curtis Sites Entered By: Curtis Sites on 12/26/2016  09:19:00 [name], [name]. ([id number]) -------------------------------------------------------------------------------- Problem List Details Patient Name: [name]. Date of Service: 12/26/2016 8:00 AM Medical Record Number: [id number] Patient Account Number: [id number] Date of Birth/Sex: Nov 22, 1965 (51 y.o. Male) Treating RN: Curtis Sites Primary Care Provider: Angus Palms Other Clinician: Referring Provider: Angus Palms Treating Provider/Extender: Rudene Re in Treatment: 0 Active Problems ICD-10 Encounter Code Description Active Date Diagnosis E11.622 Type 2 diabetes mellitus with other skin ulcer 12/26/2016 Yes L97.122 Non-pressure chronic ulcer of left thigh with fat layer exposed 12/26/2016 Yes M70.862 Other soft tissue disorders related to use, overuse and pressure, left lower leg 12/26/2016 Yes S81.002S Unspecified open wound, left knee, sequela 12/26/2016 Yes E66.01 Morbid (severe) obesity due to excess calories 12/26/2016 Yes Inactive Problems Resolved Problems Electronic Signature(s) Signed: 12/26/2016 9:26:39 AM By: Evlyn Kanner MD, FACS Entered By: Evlyn Kanner on 12/26/2016 09:26:39 [name], Richard AMarland [name] ([id number]) -------------------------------------------------------------------------------- Progress Note Details Patient Name: [name]. Date of Service: 12/26/2016 8:00 AM Medical Record Number: [id number] Patient Account Number: [id number] Date of Birth/Sex: 02-Aug-1966 (51 y.o. Male) Treating RN: Curtis Sites Primary Care Provider: Angus Palms Other Clinician: Referring Provider: Angus Palms Treating Provider/Extender: Rudene Re in Treatment: 0 Subjective Chief Complaint Information obtained from Patient the patient returns back to see [place] for his long-standing wounds of the left lower extremity in the region of his popliteal fossa due to pres his prosthesis and this has been an ongoing problem for over 2  years History of Present Illness (HPI) The following HPI elements were documented for the patient's wound: Location: left lower thigh posteriorly Quality: Patient reports experiencing [name] dull pain to affected area(s). Severity: Patient states wound are getting worse. Duration: Patient has had the  wound for > 11 months prior to seeking treatment at the wound center Timing: Pain in wound is Intermittent (comes and goes Context: The wound occurred when the patient has been wearing [name] prosthesis on his left lower extremity for [name] left BKA Modifying Factors: Other treatment(s) tried include:working with the prosthetic and orthopedic department at [facility] Associated Signs and Symptoms: Patient reports having foul odor. 51 year old gentleman who has diabetes mellitus, left below-knee amputation with previous revision in 2014 and multiple other morbidi including carotid artery disease, cardiac issues nephropathy obesity and peripheral vascular disease comes back to see as with ongoin problems with his left below-knee wounds in the region of the popliteal fossa and 1 anteriorly below the cut edge of his tibia. Nothing else has changed in his history except that the prosthesis which was made in September of last year has been discontinued du causing him [name] lot of pressure injuries. He is working on his third prosthesis in the last 2 years. Old notes: 51 year old gentleman with past medical history of diabetes mellitus with last hemoglobin A1c done in February 2016 was 8.7, hyperte hypothyroidism, coronary artery disease, ischemic cardiomyopathy , nephropathy, obesity, peripheral neuropathy, peripheral vascular d and sleep apnea. He is status post coronary angioplasty, amputation of left leg in 2012, multiple debridement of skin and subcutaneous the left BKA site, tracheostomy, revision of his left BKA in 2014. He has had problems with his left BKA prosthesis for [name] long while and had [name] vacuum system and  then had to be changed over to [name] slee system and has been working with the orthopedic and prosthetic department at [facility]. He has also had [name] plastic surgery opinion in the The ulceration on the posterior part of his lower thigh continued to be aggravating an enlarging and have [name] foul order and drainage. 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 Lubertha Basque the PA whonoted that he continue to wear his prosthe against medical advice. details from his past surgical history notes that he's had [name] cardiac catheterization, coronary angioplasty, amput the left leg in 2012, debridement of skin and subcutis tissue in June 2013, application of wound VAC at that time, below knee amputatio in April 2014. Recommendation was for [name] new socket to be made which should be above the new wound. He also recommended surgical revision of redundant skin and to stay off the prosthesis to the wound heals but the patient would not be able to do this because of work. Hence he advised to establish with [name] local wound clinic for management of his current wound and follow-up with the amputee clinic at [facility] or wit [facility]. If he decided to go back to them for surgery he should see Dr. Lanier Ensign. 03/27/2016 -- he was unable to get his prosthesis made at the place where they had begun to mold and hence he has to find himself [name] prosthetic department and is working on this. 04/10/2016 -- on his left popliteal fossa area he has got [name] small satellite abrasion with an ulceration which was caused by pulling off the his skin. He still has to see his prosthetic department. 05/01/2016 -- today he comes with [name] new problem on his right foot which he says he's had for about 2 months and has been treated by podiatrist Dr.  Ovid Curd. Says due to the fact that his footwear and  insoles were worn out, he has developed an ulcerated area continues to drain sero- sanguinous fluid and has been on doxycycline for about 2 months. He saw Dr. Ardelle Anton who did an x-ray of th Fate, [name]. ([id number]) has been following up with him for [name] long while regarding this and managing his Charcot's arthropathy right foot. Today the patient wan take [name] look at it and agrees to undergo treatment for this. 05/08/2016 -- I have reviewed notes from Dr. Vaughan Sine who saw him on 05/06/2016. after review he asked him to continue with wound care and started him on ciprofloxacin as well as his doxycycline. note is made of the fact that on his previous visit on 02/01/2016 were done which showed chronic arthritic changes present in the rear foot due to Charcot deformity. Vessel occification is present. 06/12/2016 -- the patient has not yet got his left below-knee prostheses sleeve which was to be made afresh. He has also not received foot orthotic shoe which was to be made at Dr. Kenna Gilbert office. It has been over 2 months since he keeps telling me that he is going to done soon. 07/17/2016 -- shunt has not been to see as in 5 weeks and continues to have issues trying to get his prosthesis for his left below-knee amputation. He has got new superficial abrasion on the medial part of his left knee and this is separate from the large ulceration in the fossa. Did get his inserts for his diabetic shoe for his right foot and this wound is healed. 08/07/2016 -- patient comes after 3 weeks and has [name] new wound on his left knee just over the patella from [name] new prosthesis which she about [name] week. He is back to wearing his old prostheses at present, which he will use still he gets the new one back. 10-16-2016; Mr. Fiveash is [name] 51 year old male who presents today for follow-up on his diabetic ulcers. It has been greater than 30 days last visit here less making him [name] new patient. He has [name] history of type 2  diabetes, hypertension, CHF, hyperlipidemia, MI, coronary ang with stent, hypothyroidism, CAD, ischemic cardiomyopathy, CKG stage IV, obesity, peripheral neuropathy, PVD, sleep apnea with CPA tracheostomy. He is status post left BKA in 2012 with multiple debridements and [name] revision in 2014. He is [name] long-standing history with complications with the left leg prosthesis, being ill-fated and aggravating the ulcer to his posterior left leg. Most of his care has been pe [facility]. According to documentation he continued to wear this prosthesis AGAINST MEDICAL ADVICE. According to Mr. Tallerico he does have prosthesis that is in the [place] office but he states that he cannot get hold of the orthotist for fitting, he was strongly encouraged to contact with this company to get his new prosthesis. It appears that he had [name] fall at work and went to the emergency department on 10 since then he states that he has continued to work, wearing the ill-fitted prosthesis. He is the sole provider for his family. [name] recent follow-up appointment at [facility] [name] POC A1c was performed on 06/20/2013 and it was 7.9%, prior to that in 12/25/2015 it was 7 He states he has been doing all of his dressing changes with silver alginate. He denies and there is no record of any radiographic studi vascular studies to the left leg. He is currently on no antibiotic therapy. Wound History Patient presents with 2  open wounds that have been present for approximately 3 years. Patient has been treating wounds in the follow manner: mediplex. Laboratory tests have not been performed in the last month. Patient reportedly has not tested positive for an antibio resistant organism. Patient reportedly has tested positive for osteomyelitis. Patient reportedly has had testing performed to evaluate cir the legs. Patient History Information obtained from Patient. Allergies amitriptyline (Severity: Severe, Reaction: nausea), Pravachol (Severity: Severe,  Reaction: muscles ache and elevated CK), Septra (Se Severe, Reaction: mouth ulcers), ACE Inhibitors (Severity: Severe, Reaction: increases creatinine), lovastatin (Severity: Severe, React myalgia), adhesive (Severity: Severe, Reaction: rash), Iodinated Contrast Media - Oral and IV Dye (Severity: Severe, Reaction: kidney Tree Nuts (Severity: Severe, Reaction: anaphalaxis) Family History Hypertension - Mother, No family history of Cancer, Diabetes, Heart Disease, Hereditary Spherocytosis, Kidney Disease, Lung Disease, Seizures, Stroke, Thy Problems, Tuberculosis. Social History Never smoker, Marital Status - Married, Alcohol Use - Never, Drug Use - No History, Caffeine Use - Never. Medical History Oncologic Denies history of Received Radiation Medical And Surgical History Notes Cardiovascular heart murmur, hx cardiomyopathy Genitourinary hx of kidney failure and hx of dialysis Psychiatric [name], [name]. ([id number]) hx depression Review of Systems (ROS) Genitourinary Denies complaints or symptoms of Kidney failure/ Dialysis. Medications carvedilol 6.25 mg tablet oral tablet oral loratadine 10 mg tablet oral tablet oral albuterol sulfate HFA 90 mcg/actuation aerosol inhaler inhalation HFA aerosol inhaler inhalation cephalexin 500 mg capsule oral capsule oral allopurinol 100 mg tablet oral tablet oral Novolog Flexpen 100 unit/mL subcutaneous subcutaneous insulin pen subcutaneous Toujeo SoloStar 300 unit/mL (1.5 mL) subcutaneous insulin pen subcutaneous insulin pen subcutaneous metoclopramide 10 mg tablet oral tablet oral senna 8.6 mg tablet oral tablet oral torsemide 20 mg tablet oral tablet oral magnesium oxide 400 mg tablet oral tablet oral oxycodone-acetaminophen 5 mg-325 mg tablet oral tablet oral fluticasone 50 mcg/actuation nasal spray,suspension nasal spray,suspension nasal diclofenac potassium 50 mg tablet oral tablet oral ibuprofen 800 mg tablet oral tablet  oral clopidogrel 75 mg tablet oral tablet oral gabapentin ER 300 mg tablet,extended release 24 hr oral tablet extended release 24 hr oral spironolactone 25 mg tablet oral tablet oral Zegerid 20 mg-1.1 gram capsule oral capsule oral ciprofloxacin 500 mg tablet oral tablet oral cyclobenzaprine 10 mg tablet oral tablet oral doxycycline hyclate 100 mg tablet oral tablet oral metolazone 5 mg tablet oral tablet oral metolazone 5 mg tablet oral tablet oral mupirocin 2 % topical ointment topical ointment topical clobetasol 0.05 % topical ointment topical ointment topical Objective Constitutional Pulse regular. Respirations normal and unlabored. Afebrile. Vitals Time Taken: 8:19 AM, Height: 66 in, Source: Measured, Weight: 282 lbs, Source: Measured, BMI: 45.5, Temperature: 98.3 F, P bpm, Respiratory Rate: 16 breaths/min, Blood Pressure: 129/71 mmHg. Eyes Nonicteric. Reactive to light. Ears, Nose, Mouth, and Throat Lips, teeth, and gums WNL.Marland [name] Moist mucosa without lesions. Neck supple and nontender. No palpable supraclavicular or cervical adenopathy. Normal sized without goiter. Respiratory WNL. No retractions.. Breath sounds WNL, No rubs, rales, rhonchi, or wheeze.Marland [name] Mcphie, Math [name]. ([id number]) Cardiovascular Heart rhythm and rate regular, no murmur or gallop.. Pedal Pulses WNL. No clubbing, cyanosis or edema. Chest Breasts symmetical and no nipple discharge.. Breast tissue WNL, no masses, lumps, or tenderness.. Lymphatic No adneopathy. No adenopathy. No adenopathy. Musculoskeletal Adexa without tenderness or enlargement.. Digits and nails w/o clubbing, cyanosis, infection, petechiae, ischemia, or inflammatory con Psychiatric Judgement and insight Intact.. No evidence of depression, anxiety, or agitation.. General Notes: in the region of the anterior below-knee area where he is currently  [name] lot of redundant skin he's got [name] shallow sinus tract pressure from the suction cup. This is not  infected or inflamed. Wound in the popliteal fossa region of his below-knee area has [name] lot of t with rolled in edges and this area is tender but not inflamed. He cannot tolerate the debridement. Integumentary (Hair, Skin) No suspicious lesions. No crepitus or fluctuance. No peri-wound warmth or erythema. No masses.. Wound #10 status is Open. Original cause of wound was Pressure Injury. The wound is located on the Left,Posterior Amputation Site - Knee. The wound measures 3cm length x 7.2cm width x 0.4cm depth; 16.965cm^2 area and 6.786cm^3 volume. The wound is limited breakdown. There is no tunneling or undermining noted. There is [name] large amount of serous drainage noted. The wound margin is flat an There is large (67-100%) pink granulation within the wound bed. There is [name] small (1-33%) amount of necrotic tissue within the wound b including Adherent Slough. The periwound skin appearance exhibited: Maceration. The periwound skin appearance did not exhibit: Ca Crepitus, Excoriation, Induration, Rash, Scarring, Dry/Scaly, Atrophie Blanche, Cyanosis, Ecchymosis, Hemosiderin Staining, Mottled, Rubor, Erythema. The periwound has tenderness on palpation. Wound #11 status is Open. Original cause of wound was Pressure Injury. The wound is located on the Left,Anterior Amputation Site - B Knee. The wound measures 0.2cm length x 0.2cm width x 0.9cm depth; 0.031cm^2 area and 0.028cm^3 volume. The wound is limited breakdown. There is no tunneling or undermining noted. There is [name] large amount of sanguinous drainage noted. The wound margin is f intact. There is large (67-100%) pink granulation within the wound bed. There is no necrotic tissue within the wound bed. The periwoun appearance did not exhibit: Callus, Crepitus, Excoriation, Induration, Rash, Scarring, Dry/Scaly, Maceration, Atrophie Blanche, Cyanos Ecchymosis, Hemosiderin Staining, Mottled, Pallor, Rubor, Erythema. The periwound has tenderness on  palpation. 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 He returns to [place] from time to time, so that he can get his supplies, but does not heed our advice, nor does he ta recommended consultations seriously. [name], [name]. ([id number]) After [name] prolonged wait, he finally got [name] new prosthesis to offload the area of concern in his popliteal region. However this has caused [name] wound on the patella and hence the prosthesis was discontinued, and he is usually his original 83-year-old prosthesis. I have recommended silver alginate to the popliteal fossa and protected with bordered foam and offloading as much as possible. the su sinus tract in the left below knee area anteriorly will be packed with 1/4 inch iodoform gauze. I have strongly recommended [name] plastic surgery consult at [facility] where he's had his previous surgery. If they can use some o redundant skin and construct appropriate flaps to heal this wound primarily, this will go [name] long way in and eventually sorting his problem long-term. This has been suggested to him in the past, but he never follows our advise. All other supportive care which we have been discussing with him for [name] long while has been reemphasized. Plan Wound Cleansing: Wound #  10 Left,Posterior Amputation Site - Below Knee: Clean wound with Normal Saline. Wound #11 Left,Anterior Amputation Site - Below Knee: Clean wound with Normal Saline. Anesthetic: Wound #10 Left,Posterior Amputation Site - Below  Knee: Topical Lidocaine 4% cream applied to wound bed prior to debridement Wound #11 Left,Anterior Amputation Site - Below Knee: Topical Lidocaine 4% cream applied to wound bed prior to debridement Primary Wound Dressing: Wound #10 Left,Posterior Amputation Site - Below Knee: Aquacel Ag Wound #11 Left,Anterior Amputation Site - Below Knee: Aquacel Ag Secondary Dressing: Wound #10 Left,Posterior Amputation Site - Below Knee: Conform/Kerlix Foam Wound #11 Left,Anterior Amputation Site - Below Knee: Conform/Kerlix Foam Dressing Change Frequency: Wound #10 Left,Posterior Amputation Site - Below Knee: Change dressing every other day. Wound #11 Left,Anterior Amputation Site - Below Knee: Change dressing every other day. Follow-up Appointments: Wound #10 Left,Posterior Amputation Site - Below Knee: Other: - 1 month Wound #11 Left,Anterior Amputation Site - Below Knee: Other: - 1 month Off-Loading: Wound #10 Left,Posterior Amputation Site - Below Knee: Other: - take pressure off your leg as much as possible Wound #11 Left,Anterior Amputation Site - Below Knee: Other: - take pressure off your leg as much as possible Additional Orders / Instructions: Wound #10 Left,Posterior Amputation Site - Below Knee: Increase protein intake. Other: - add vitamin c, vitamin [name], and zinc supplements to your diet Wound #11 Left,Anterior Amputation Site - Below Knee: Increase protein intake. Other: - add vitamin c, vitamin [name], and zinc supplements to your diet General Notes: Please call [facility] when you are ready for Plastic Surgery consult [name], [name]. ([id number]) This is [name] complex patient who is noncompliant because he works 8 hours [name] day using his prosthesis and has no recourse because it's means for [name] paycheck. He returns to [place] from time to time, so that he can get his supplies, but does not heed our advice, nor does he ta recommended consultations seriously. After [name] prolonged  wait, he finally got [name] new prosthesis to offload the area of concern in his popliteal region. However this has caused [name] wound on the patella and hence the prosthesis was discontinued, and he is usually his original 79-year-old prosthesis. I have recommended silver alginate to the popliteal fossa and protected with bordered foam and offloading as much as possible. the su sinus tract in the left below knee area anteriorly will be packed with 1/4 inch iodoform gauze. I have strongly recommended [name] plastic surgery consult at [facility] where he's had his previous surgery. If they can use some o redundant skin and construct appropriate flaps to heal this wound primarily, this will go [name] long way in and eventually sorting his problem long-term. This has been suggested to him in the past, but he never follows our advise. All other supportive care which we have been discussing with him for [name] long while has been reemphasized. Electronic Signature(s) Signed: 01/16/2017 2:35:21 PM By: Elliot Gurney RN, BSN, Kim RN, BSN Signed: 01/16/2017 3:39:05 PM By: Evlyn Kanner MD, FACS Previous Signature: 12/26/2016 9:36:32 AM Version By: Evlyn Kanner MD, FACS Entered By: Elliot Gurney RN, BSN, Kim on 01/16/2017 14:19:56 [name], [name] ([id number]) -------------------------------------------------------------------------------- ROS/PFSH Details Patient Name: [name]. Date of Service: 12/26/2016 8:00 AM Medical Record Number: [id number] Patient Account Number: [id number] Date of Birth/Sex: [birth year] (51 y.o. Male) Treating RN: Curtis Sites Primary Care Provider: Angus Palms Other Clinician: Referring Provider: Angus Palms Treating Provider/Extender: Rudene Re in Treatment: 0 Information Obtained From Patient Wound History Do  you currently have one or more open woundso Yes How many open wounds do you currently haveo 2 Approximately how long have you had your woundso 3 yea How have you been  treating your wound(s) until nowo medi Has your wound(s) ever healed and then re-openedo No Have you had any lab work done in the past montho No Have you tested positive for an antibiotic resistant organism (MRSA, VRE)o No Have you tested positive for osteomyelitis (bone infection)o Yes Date: 1/1/2 Have you had any tests for circulation on your legso Yes Where was the test doneo unkn Genitourinary Complaints and Symptoms: Negative for: Kidney failure/ Dialysis Medical History: Negative for: End Stage Renal Disease Past Medical History Notes: hx of kidney failure and hx of dialysis Eyes Medical History: Negative for: Cataracts; Glaucoma; Optic Neuritis Ear/Nose/Mouth/Throat Medical History: Positive for: Chronic sinus problems/congestion Negative for: Middle ear problems Hematologic/Lymphatic Medical History: Positive for: Anemia Respiratory Medical History: Positive for: Asthma; Sleep Apnea Cardiovascular Medical History: Positive for: Coronary Artery Disease; Hypertension; 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 ([id number]) Medical History: Negative for: Cirrhosis ; Colitis; Crohnos; Hepatitis [name]; Hepatitis B; Hepatitis C Endocrine Medical History: Positive for: Type II Diabetes Time with diabetes: 30 years Treated with: Insulin Blood sugar tested every day: Yes Tested : 3 times [name] day Immunological Medical History: Negative for: Lupus Erythematosus; Raynaudos; Scleroderma Integumentary (Skin) Medical History: Positive for: History of pressure wounds - prosthetic injury Negative for: History of Burn Musculoskeletal Medical History: Positive for: Gout Neurologic Medical History: Positive for: Neuropathy Oncologic Medical History: Negative for: Received Chemotherapy; Received Radiation Psychiatric Medical History: Negative for:  Anorexia/bulimia; Confinement Anxiety Past Medical History Notes: hx depression HBO Extended History Items Ear/Nose/Mouth/Throat: Chronic sinus problems/congestion Immunizations Pneumococcal Vaccine: Received Pneumococcal Vaccination: Yes Immunization Notes: up to date Family and Social History Cancer: No; Diabetes: No; Heart Disease: No; Hereditary Spherocytosis: No; Hypertension: Yes - Mother; Kidney Disease: No; Lung D No; Seizures: No; Stroke: No; Thyroid Problems: No; Tuberculosis: No; Never smoker; Marital Status - Married; Alcohol Use: Never; D No History; Caffeine Use: Never; Financial Concerns: No; Food, Clothing or Shelter Needs: No; Support System Lacking: No; Transpo Concerns: No; Advanced Directives: No; Patient does not want information on Advanced Directives; Do not resuscitate: No; Living Will Medical Power of Attorney: No Physician Affirmation I have reviewed and agree with the above information. TRI, CHITTICK ([id number]) Electronic Signature(s) Signed: 12/26/2016 3:35:58 PM By: Evlyn Kanner MD, FACS Signed: 12/26/2016 4:09:04 PM By: Curtis Sites Entered By: Evlyn Kanner on 12/26/2016 09:24:45 Brinley, Donyel AMarland [name] ([id number]) -------------------------------------------------------------------------------- SuperBill Details Patient Name: [name]. Date of Service: 12/26/2016 Medical Record Number: [id number] Patient Account Number: [id number] Date of Birth/Sex: [birth year] (50 y.o. 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 M70.862 Other soft tissue disorders related to use, overuse and pressure, left lower  leg S81.002S Unspecified open wound, left knee, sequela E66.01 Morbid (severe) obesity due to excess calories Facility Procedures CPT4 Code: 62952841 Description: 99214 - WOUND CARE VISIT-LEV 4 EST PT Modifier: Q: Physician Procedures CPT4 Code: 3244010 Description: 99214 - WC PHYS LEVEL 4 - EST PT ICD-10 Description Diagnosis E11.622 Type 2  diabetes mellitus with other skin ulcer 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

## 2016-12-27 NOTE — Progress Notes (Signed)
Idamae SchullerHILLIPS, Doil A. ([id number]) Visit Report for 12/26/2016 Abuse/Suicide Risk Screen Details Patient Name: Richard KyleHILLIPS, Eastin A. Date of Service: 12/26/2016 8:00 AM Medical Record Number: [id number] Patient Account Number: [id number] Date of Birth/Sex: 21-Mar-1966 21(50 y.o. Male) Treating RN: Curtis Sitesorthy, Joanna Primary Care Diana Davenport: Angus PalmsGEORGE, SIONNE Other Clinician: Referring Oliana Gowens: Angus PalmsGEORGE, SIONNE Treating Darlisa Spruiell/Extender: Rudene ReBritto, Errol Weeks in Treatment: 0 Abuse/Suicide Risk Screen Items Answer ABUSE/SUICIDE RISK SCREEN: Has anyone close to you tried to hurt or harm you recentlyo No Do you feel uncomfortable with anyone in your familyo No Has anyone forced you do things that you didnot want to doo No Do you have any thoughts of harming yourselfo No Patient displays signs or symptoms of abuse and/or neglect. No Electronic Signature(s) Signed: 12/26/2016 4:09:04 PM By: Curtis Sitesorthy, Joanna Entered By: Curtis Sitesorthy, Joanna on 12/26/2016 08:26:50 Marland, Luisdaniel AMarland Kitchen. ([id number]) -------------------------------------------------------------------------------- Activities of Daily Living Details Patient Name: Richard KylePHILLIPS, Kenlee A. Date of Service: 12/26/2016 8:00 AM Medical Record Number: [id number] Patient Account Number: [id number] Date of Birth/Sex: 21-Mar-1966 [age] y.o. Male) Treating RN: Curtis Sitesorthy, Joanna Primary Care Bradan Congrove: Angus PalmsGEORGE, SIONNE Other Clinician: Referring Shakeera Rightmyer: Angus PalmsGEORGE, SIONNE Treating Navia Lindahl/Extender: Rudene ReBritto, Errol Weeks in Treatment: 0 Activities of Daily Living Items Answer Activities of Daily Living (Please select one for each item) Drive Automobile Completely Able Take Medications Completely Able Use Telephone Completely Able Care for Appearance Completely Able Use Toilet Completely Able Bath / Shower Completely Able Dress Self Completely Able Feed Self Completely Able Walk Completely Able Get In / Out Bed Completely Able Housework Completely Able Prepare Meals Completely  Able Handle Money Completely Able Shop for Self Completely Able Electronic Signature(s) Signed: 12/26/2016 4:09:04 PM By: Curtis Sitesorthy, Joanna Entered By: Curtis Sitesorthy, Joanna on 12/26/2016 08:27:05 Cooperman, Marko AMarland Kitchen. ([id number]) -------------------------------------------------------------------------------- Education Assessment Details Patient Name: Richard KylePHILLIPS, Severiano A. Date of Service: 12/26/2016 8:00 AM Medical Record Number: [id number] Patient Account Number: [id number] Date of Birth/Sex: 21-Mar-1966 [age] y.o. Male) Treating RN: Curtis Sitesorthy, Joanna Primary Care Dilan Fullenwider: Angus PalmsGEORGE, SIONNE Other Clinician: Referring Chayce Robbins: Angus PalmsGEORGE, SIONNE Treating Christropher Gintz/Extender: Rudene ReBritto, Errol Weeks in Treatment: 0 Primary Learner Assessed: Patient Learning Preferences/Education Level/Primary Language Learning Preference: Explanation, Demonstration Highest Education Level: Grade School Preferred Language: English Cognitive Barrier Assessment/Beliefs Language Barrier: No Translator Needed: No Memory Deficit: No Emotional Barrier: No Cultural/Religious Beliefs Affecting Medical No Care: Physical Barrier Assessment Impaired Vision: No Impaired Hearing: No Decreased Hand dexterity: No Knowledge/Comprehension Assessment Knowledge Level: Medium Comprehension Level: Medium Ability to understand written Medium instructions: Ability to understand verbal Medium instructions: Motivation Assessment Anxiety Level: Calm Cooperation: Cooperative Education Importance: Acknowledges Need Interest in Health Problems: Uninterested Perception: Coherent Willingness to Engage in Self- Medium Management Activities: Readiness to Engage in Self- Medium Management Activities: Electronic Signature(s) Idamae SchullerHILLIPS, Llewyn A. ([id number]) Signed: 12/26/2016 4:09:04 PM By: Curtis Sitesorthy, Joanna Entered By: Curtis Sitesorthy, Joanna on 12/26/2016 08:27:43 Ruzich, Marcellas AMarland Kitchen.  ([id number]) -------------------------------------------------------------------------------- Fall Risk Assessment Details Patient Name: Richard KylePHILLIPS, Klinton A. Date of Service: 12/26/2016 8:00 AM Medical Record Number: [id number] Patient Account Number: [id number] Date of Birth/Sex: 21-Mar-1966 [age] y.o. Male) Treating RN: Curtis Sitesorthy, Joanna Primary Care Trenda Corliss: Angus PalmsGEORGE, SIONNE Other Clinician: Referring Lilleigh Hechavarria: Angus PalmsGEORGE, SIONNE Treating Terriann Difonzo/Extender: Rudene ReBritto, Errol Weeks in Treatment: 0 Fall Risk Assessment Items Have you had 2 or more falls in the last 12 monthso 0 Yes Have you had any fall that resulted in injury in the last 12 monthso 0 No FALL RISK ASSESSMENT: History of falling - immediate or within 3 months 25 Yes Secondary diagnosis 0 No Ambulatory aid None/bed rest/wheelchair/nurse 0 Yes Crutches/cane/walker 0 No Furniture 0 No  IV Access/Saline Lock 0 No Gait/Training Normal/bed rest/immobile 0 No Weak 10 Yes Impaired 0 No Mental Status Oriented to own ability 0 Yes Electronic Signature(s) Signed: 12/26/2016 4:09:04 PM By: Curtis Sites Entered By: Curtis Sites on 12/26/2016 08:28:14 Kier, Calix A. ([id number]) -------------------------------------------------------------------------------- Foot Assessment Details Patient Name: [name] A. Date of Service: 12/26/2016 8:00 AM Medical Record Number: [id number] Patient Account Number: [id number] Date of Birth/Sex: [birth year] (51 y.o. Male) Treating RN: Curtis Sites Primary Care Rylinn Linzy: Angus Palms Other Clinician: Referring Decklan Mau: Angus Palms Treating Sheray Grist/Extender: Rudene Re in Treatment: 0 Foot Assessment Items Site Locations + = Sensation present, - = Sensation absent, C = Callus, U = Ulcer R = Redness, W = Warmth, M = Maceration, PU = Pre-ulcerative lesion F = Fissure, S = Swelling, D = Dryness Assessment Right: Left: Other Deformity: No No Prior Foot Ulcer: No No Prior  Amputation: No Yes Charcot Joint: No No Ambulatory Status: Ambulatory Without Help Gait: Unsteady Electronic Signature(s) Signed: 12/26/2016 4:09:04 PM By: Curtis Sites Entered By: Curtis Sites on 12/26/2016 08:28:50 Swayne, Izack A. ([id number]) -------------------------------------------------------------------------------- Nutrition Risk Assessment Details Patient Name: [name] A. Date of Service: 12/26/2016 8:00 AM Medical Record Number: [id number] Patient Account Number: [id number] Date of Birth/Sex: [birth year] (51 y.o. Male) Treating RN: Curtis Sites Primary Care Massai Hankerson: Angus Palms Other Clinician: Referring Analyce Tavares: Angus Palms Treating Denecia Brunette/Extender: Rudene Re in Treatment: 0 Height (in): 66 Weight (lbs): 282 Body Mass Index (BMI): 45.5 Nutrition Risk Assessment Items NUTRITION RISK SCREEN: I have an illness or condition that made me change the kind and/or 0 No amount of food I eat I eat fewer than two meals per day 0 No I eat few fruits and vegetables, or milk products 0 No I have three or more drinks of beer, liquor or wine almost every day 0 No I have tooth or mouth problems that make it hard for me to eat 0 No I don't always have enough money to buy the food I need 0 No I eat alone most of the time 0 No I take three or more different prescribed or over-the-counter drugs a 1 Yes day Without wanting to, I have lost or gained 10 pounds in the last six 0 No months I am not always physically able to shop, cook and/or feed myself 0 No Nutrition Protocols Good Risk Protocol 0 No interventions needed Moderate Risk Protocol Electronic Signature(s) Signed: 12/26/2016 4:09:04 PM By: Curtis Sites Entered By: Curtis Sites on 12/26/2016 08:28:19

## 2016-12-27 NOTE — Progress Notes (Signed)
JAEGER, TRUEHEART ([id number]) Visit Report for 12/26/2016 Allergy List Details Patient Name: [name], [name]. Date of Service: 12/26/2016 8:00 AM Medical Record Number: [id number] Patient Account Number: [id number] Date of Birth/Sex: [birth year] (51 y.o. Male) Treating RN: Curtis Sites Primary Care Rosland Riding: Angus Palms Other Clinician: Referring Annaleigh Steinmeyer: Angus Palms Treating Dafney Farler/Extender: Rudene Re in Treatment: 0 Allergies Active Allergies amitriptyline Reaction: nausea Severity: Severe Pravachol Reaction: muscles ache and elevated CK Severity: Severe Septra Reaction: mouth ulcers Severity: Severe ACE Inhibitors Reaction: increases creatinine Severity: Severe lovastatin Reaction: myalgia Severity: Severe adhesive Reaction: rash Severity: Severe Iodinated Contrast Media - Oral and IV Dye Reaction: kidney disorder Severity: Severe Tree Nuts Reaction: anaphalaxis Severity: Severe [name]. ([id number]) Allergy Notes Electronic Signature(s) Signed: 12/26/2016 4:09:04 PM By: Curtis Sites Entered By: Curtis Sites on 12/26/2016 08:20:04 Farino, Sukhraj AMarland Kitchen ([id number]) -------------------------------------------------------------------------------- Arrival Information Details Patient Name: [name]. Date of Service: 12/26/2016 8:00 AM Medical Record Number: [id number] Patient Account Number: [id number] Date of Birth/Sex: 09/29/1966 (51 y.o. Male) Treating RN: Curtis Sites Primary Care Orva Riles: Angus Palms Other Clinician: Referring Domani Bakos: Angus Palms Treating Yassmine Tamm/Extender: Rudene Re in Treatment: 0 Visit Information Patient Arrived: Ambulatory Arrival Time: 08:18 Accompanied By: self Transfer Assistance: None Patient Identification Verified: Yes Secondary Verification Process Yes Completed: Patient Has Alerts: Yes Patient Alerts: DMII History Since Last Visit Added or deleted any  medications: No Any new allergies or adverse reactions: No Had [name] fall or experienced change in activities of daily living that may affect risk of falls: No Signs or symptoms of abuse/neglect since last visito No Hospitalized since last visit: No Electronic Signature(s) Signed: 12/26/2016 4:09:04 PM By: Curtis Sites Entered By: Curtis Sites on 12/26/2016 08:19:09 [name]. ([id number]) -------------------------------------------------------------------------------- Clinic Level of Care Assessment Details Patient Name: [name]. Date of Service: 12/26/2016 8:00 AM Medical Record Number: [id number] Patient Account Number: [id number] Date of Birth/Sex: 07-Jan-1966 (51 y.o. Male) Treating RN: Curtis Sites Primary Care Shanitha Twining: Angus Palms Other Clinician: Referring Chadwick Reiswig: Angus Palms Treating Jamiyla Ishee/Extender: Rudene Re in Treatment: 0 Clinic Level of Care Assessment Items TOOL 2 Quantity Score []  - Use when only an EandM is performed on the INITIAL visit 0 ASSESSMENTS - Nursing Assessment / Reassessment X - General Physical Exam (combine w/ comprehensive assessment (listed just 1 20 below) when performed on new pt. evals) X - Comprehensive Assessment (HX, ROS, Risk Assessments, Wounds Hx, etc.) 1 25 ASSESSMENTS - Wound and Skin Assessment / Reassessment X - Simple Wound Assessment / Reassessment - one wound 1 5 []  - Complex Wound Assessment / Reassessment - multiple wounds 0 []  - Dermatologic / Skin Assessment (not related to wound area) 0 ASSESSMENTS - Ostomy and/or Continence Assessment and Care []  - Incontinence Assessment and Management 0 []  - Ostomy Care Assessment and Management (repouching, etc.) 0 PROCESS - Coordination of Care X - Simple Patient / Family Education for ongoing care 1 15 []  - Complex (extensive) Patient / Family Education for ongoing care 0 X - Staff obtains Chiropractor, Records, Test Results / Process Orders 1 10 []  -  Staff telephones HHA, Nursing Homes / Clarify orders / etc 0 []  - Routine Transfer to another Facility (non-emergent condition) 0 []  - Routine Hospital Admission (non-emergent condition) 0 X - New Admissions / Manufacturing engineer / Ordering NPWT, Apligraf, etc. 1 15 []  - Emergency Hospital Admission (emergent condition) 0 X - Simple Discharge Coordination 1 10 [name], [name]. ([id number]) []  - Complex (extensive) Discharge Coordination 0 PROCESS -  Special Needs []  - Pediatric / Minor Patient Management 0 []  - Isolation Patient Management 0 []  - Hearing / Language / Visual special needs 0 []  - Assessment of Community assistance (transportation, D/C planning, etc.) 0 []  - Additional assistance / Altered mentation 0 []  - Support Surface(s) Assessment (bed, cushion, seat, etc.) 0 INTERVENTIONS - Wound Cleansing / Measurement X - Wound Imaging (photographs - any number of wounds) 1 5 []  - Wound Tracing (instead of photographs) 0 []  - Simple Wound Measurement - one wound 0 X - Complex Wound Measurement - multiple wounds 2 5 []  - Simple Wound Cleansing - one wound 0 X - Complex Wound Cleansing - multiple wounds 2 5 INTERVENTIONS - Wound Dressings X - Small Wound Dressing one or multiple wounds 2 10 []  - Medium Wound Dressing one or multiple wounds 0 []  - Large Wound Dressing one or multiple wounds 0 []  - Application of Medications - injection 0 INTERVENTIONS - Miscellaneous []  - External ear exam 0 []  - Specimen Collection (cultures, biopsies, blood, body fluids, etc.) 0 []  - Specimen(s) / Culture(s) sent or taken to Lab for analysis 0 []  - Patient Transfer (multiple staff / Nurse, adult / Similar devices) 0 []  - Simple Staple / Suture removal (25 or less) 0 []  - Complex Staple / Suture removal (26 or more) 0 [name], [name]. ([id number]) []  - Hypo / Hyperglycemic Management (close monitor of Blood Glucose) 0 []  - Ankle / Brachial Index (ABI) - do not check if billed separately  0 Has the patient been seen at the hospital within the last three years: Yes Total Score: 145 Level Of Care: New/Established - Level 4 Electronic Signature(s) Signed: 12/26/2016 4:09:04 PM By: Curtis Sites Entered By: Curtis Sites on 12/26/2016 09:07:07 [name], Richard AMarland Kitchen ([id number]) -------------------------------------------------------------------------------- Encounter Discharge Information Details Patient Name: [name]. Date of Service: 12/26/2016 8:00 AM Medical Record Number: [id number] Patient Account Number: [id number] Date of Birth/Sex: 03-06-1966 (51 y.o. Male) Treating RN: Curtis Sites Primary Care Sharvi Mooneyhan: Angus Palms Other Clinician: Referring Tailor Westfall: Angus Palms Treating Jazzmin Newbold/Extender: Rudene Re in Treatment: 0 Encounter Discharge Information Items Discharge Pain Level: 0 Discharge Condition: Stable Ambulatory Status: Ambulatory Discharge Destination: Home Transportation: Private Auto Accompanied By: self Schedule Follow-up Appointment: Yes Medication Reconciliation completed and provided to Patient/Care No Denise Bramblett: Provided on Clinical Summary of Care: 12/26/2016 Form Type Recipient Paper Patient DP Electronic Signature(s) Signed: 12/26/2016 4:09:04 PM By: Curtis Sites Previous Signature: 12/26/2016 9:37:10 AM Version By: Gwenlyn Perking Entered By: Curtis Sites on 12/26/2016 10:15:54 [name], Johsua [name]. ([id number]) -------------------------------------------------------------------------------- Multi Wound Chart Details Patient Name: [name]. Date of Service: 12/26/2016 8:00 AM Medical Record Number: [id number] Patient Account Number: [id number] Date of Birth/Sex: 1966-08-12 (51 y.o. 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 N/[name] Comorbid History: Chronic sinus Chronic sinus N/[name] problems/congestion, problems/congestion, Anemia, Asthma, Sleep Anemia, Asthma, Sleep Apnea, Coronary Artery Apnea, Coronary Artery Disease, Hypertension, Disease, Hypertension, Myocardial Infarction, Myocardial Infarction, Type II Diabetes, History Type II  Diabetes, History of pressure wounds, Gout, of pressure wounds, Gout, Neuropathy Neuropathy Date Acquired: 12/26/2014 12/15/2016 N/[name] Weeks of Treatment: 0 0 N/[name] Wound Status: Open Open N/[name] Pending Amputation on Yes Yes N/[name] Presentation: Measurements L x W x D 3x7.2x0.4 0.2x0.2x0.9 N/[name] (cm) Area (cm) : 16.965 0.031 N/[name] Gholson, Camar [name]. ([id number]) Volume (cm) : 6.786 0.028 N/[name] % Reduction in Area: 0.00% 0.00% N/[name] % Reduction in Volume: 0.00% 0.00% N/[name] Classification: Grade 1 Grade 1 N/[name] Exudate Amount: Large Large N/[name] Exudate Type: Serous Sanguinous N/[name] Exudate Color: amber red N/[name] Wound Margin: Flat and Intact Flat and Intact N/[name] Granulation Amount: Large (67-100%) Large (67-100%) N/[name] Granulation Quality: Pink Pink N/[name] Necrotic Amount: Small (1-33%) None Present (0%) N/[name] Exposed Structures: Fascia: No Fascia: No N/[name] Fat Layer (Subcutaneous Fat Layer (Subcutaneous Tissue) Exposed: No Tissue) Exposed: No Tendon: No Tendon: No Muscle: No Muscle: No Joint: No Joint: No Bone: No Bone: No Limited to Skin Limited to Skin Breakdown Breakdown Epithelialization: None None N/[name] Periwound Skin  Texture: Excoriation: No Excoriation: No N/[name] Induration: No Induration: No Callus: No Callus: No Crepitus: No Crepitus: No Rash: No Rash: No Scarring: No Scarring: No Periwound Skin Maceration: Yes Maceration: No N/[name] Moisture: Dry/Scaly: No Dry/Scaly: No Periwound Skin Color: Atrophie Blanche: No Atrophie Blanche: No N/[name] Cyanosis: No Cyanosis: No Ecchymosis: No Ecchymosis: No Erythema: No Erythema: No Hemosiderin Staining: No Hemosiderin Staining: No Mottled: No Mottled: No Pallor: No Pallor: No Rubor: No Rubor: No Tenderness on Yes Yes N/[name] Palpation: Wound Preparation: Ulcer Cleansing: Ulcer Cleansing: N/[name] Rinsed/Irrigated with Rinsed/Irrigated with Saline Saline Topical Anesthetic Topical Anesthetic Applied: Other: lidocaine Applied: Other: lidocaine 4% 4% Treatment Notes Idamae SchullerHILLIPS, Mihcael [name]. ([id number]) Electronic Signature(s) Signed: 12/26/2016 9:26:49 AM By: Evlyn KannerBritto, Errol MD, FACS Entered By: Evlyn KannerBritto, Errol on 12/26/2016 09:26:49 Rowzee, Jens SomARREN [name]. ([id number]) -------------------------------------------------------------------------------- Multi-Disciplinary Care Plan Details Patient Name: Richard KylePHILLIPS, [name]. Date of Service: 12/26/2016 8:00 AM Medical Record Number: [id number] Patient Account Number: [id number] Date of Birth/Sex: 11-17-1966 18(50 y.o. Male) Treating RN: Curtis Sitesorthy, Joanna Primary Care Ronit Marczak: Angus PalmsGEORGE, SIONNE Other Clinician: Referring Vivia Rosenburg: Angus PalmsGEORGE, SIONNE Treating Brit Wernette/Extender: Rudene ReBritto, Errol Weeks in Treatment: 0 Active Inactive ` Abuse / Safety / Falls / Self Care Management Nursing Diagnoses: Impaired physical mobility Potential for falls Goals: Patient will remain injury free Date Initiated: 12/26/2016 Target Resolution Date: 03/20/2017 Goal Status: Active Interventions: Assess fall risk on admission and as needed Notes: ` Orientation to the Wound Care Program Nursing Diagnoses: Knowledge deficit related to the wound  healing center program Goals: Patient/caregiver will verbalize understanding of the Wound Healing Center Program Date Initiated: 12/26/2016 Target Resolution Date: 03/20/2017 Goal Status: Active Interventions: Provide education on orientation to the wound center Notes: ` Pressure Nursing Diagnoses: Knowledge deficit related to management of pressures ulcers Hickok, Kadence [name]. ([id number]) Goals: Patient will remain free from development of additional pressure ulcers Date Initiated: 12/26/2016 Target Resolution Date: 03/20/2017 Goal Status: Active Interventions: Provide education on pressure ulcers Notes: ` Wound/Skin Impairment Nursing Diagnoses: Impaired tissue integrity Goals: Patient/caregiver will verbalize understanding of skin care regimen Date Initiated: 12/26/2016 Target Resolution Date: 03/20/2017 Goal Status: Active Ulcer/skin breakdown will have [name] volume reduction of 30% by week 4 Date Initiated: 12/26/2016 Target Resolution Date: 03/20/2017 Goal Status: Active Ulcer/skin breakdown will have [name] volume reduction of 50% by week 8 Date Initiated: 12/26/2016 Target Resolution Date: 03/20/2017 Goal Status: Active Ulcer/skin breakdown will have [name] volume reduction of 80% by week 12 Date Initiated: 12/26/2016 Target Resolution Date: 03/20/2017 Goal Status: Active Ulcer/skin  breakdown will heal within 14 weeks Date Initiated: 12/26/2016 Target Resolution Date: 03/20/2017 Goal Status: Active Interventions: Assess patient/caregiver ability to obtain necessary supplies Assess patient/caregiver ability to perform ulcer/skin care regimen upon admission and as needed Assess ulceration(s) every visit Notes: Electronic Signature(s) Signed: 12/26/2016 4:09:04 PM By: Curtis Sites Entered By: Curtis Sites on 12/26/2016 09:02:47 [name], [name]. ([id number]) -------------------------------------------------------------------------------- Pain Assessment Details Patient Name: Richard Kyle  [name]. Date of Service: 12/26/2016 8:00 AM Medical Record Number: [id number] Patient Account Number: [id number] Date of Birth/Sex: September 05, 1966 (51 y.o. Male) Treating RN: Curtis Sites Primary Care Natoshia Souter: Angus Palms Other Clinician: Referring Dolorez Jeffrey: Angus Palms Treating Malaia Buchta/Extender: Rudene Re in Treatment: 0 Active Problems Location of Pain Severity and Description of Pain Patient Has Paino Yes Site Locations Pain Location: Pain in Ulcers With Dressing Change: Yes Duration of the Pain. Constant / Intermittento Constant Pain Management and Medication Current Pain Management: Notes Topical or injectable lidocaine is offered to patient for acute pain when surgical debridement is performed. If needed, Patient is instructed to use over the counter pain medication for the following 24-48 hours after debridement. Wound care MDs do not prescribed pain medications. Patient has chronic pain or uncontrolled pain. Patient has been instructed to make an appointment with their Primary Care Physician for pain management. Electronic Signature(s) Signed: 12/26/2016 4:09:04 PM By: Curtis Sites Entered By: Curtis Sites on 12/26/2016 08:19:34 [name], [name] ([id number]) -------------------------------------------------------------------------------- Patient/Caregiver Education Details Patient Name: [name]. Date of Service: 12/26/2016 8:00 AM Medical Record Number: [id number] Patient Account Number: [id number] Date of Birth/Gender: 1966-05-29 (51 y.o. Male) Treating RN: Curtis Sites Primary Care Physician: Angus Palms Other Clinician: Referring Physician: Angus Palms Treating Physician/Extender: Rudene Re in Treatment: 0 Education Assessment Education Provided To: Patient Education Topics Provided Wound/Skin Impairment: Handouts: Other: wound care [name] sordered and please return for f/u Methods: Demonstration, Explain/Verbal Responses:  State content correctly Electronic Signature(s) Signed: 12/26/2016 4:09:04 PM By: Curtis Sites Entered By: Curtis Sites on 12/26/2016 10:16:22 [name], [name]. ([id number]) -------------------------------------------------------------------------------- Wound Assessment Details Patient Name: [name]. Date of Service: 12/26/2016 8:00 AM Medical Record Number: [id number] Patient Account Number: [id number] Date of Birth/Sex: 06/10/1966 (51 y.o. Male) Treating RN: Curtis Sites Primary Care Averil Digman: Angus Palms Other Clinician: Referring Nathasha Fiorillo: Angus Palms Treating Ketrina Boateng/Extender: Rudene Re in Treatment: 0 Wound Status Wound Number: 10 Primary Diabetic Wound/Ulcer of the Lower Etiology: Extremity Wound Location: Left Amputation Site - Below Knee - Posterior Secondary Pressure Ulcer Etiology: Wounding Event: Pressure Injury Wound Open Date Acquired: 12/26/2014 Status: Weeks Of Treatment: 0 Comorbid Chronic sinus problems/congestion, Clustered Wound: No History: Anemia, Asthma, Sleep Apnea, Pending Amputation On Presentation Coronary Artery Disease, Hypertension, Myocardial Infarction, Type II Diabetes, History of pressure wounds, Gout, Neuropathy Photos Wound Measurements Length: (cm) 3 Width: (cm) 7.2 Depth: (cm) 0.4 Area: (cm) 16.965 Volume: (cm) 6.786 % Reduction in Area: 0% % Reduction in Volume: 0% Epithelialization: None Tunneling: No Undermining: No Wound Description Classification: Grade 1 Foul Odor Afte Wound Margin: Flat and Intact Slough/Fibrino Exudate Amount: Large Exudate Type: Serous Exudate Color: amber r Cleansing: No Yes Wound Bed Lamia, Rockne [name]. ([id number]) Granulation Amount: Large (67-100%) Exposed Structure Granulation Quality: Pink Fascia Exposed: No Necrotic Amount: Small (1-33%) Fat Layer (Subcutaneous Tissue) Exposed: No Necrotic Quality: Adherent Slough Tendon Exposed: No Muscle Exposed:  No Joint Exposed: No Bone Exposed: No Limited to Skin Breakdown Periwound Skin Texture Texture Color No Abnormalities Noted: No No Abnormalities Noted: No Callus: No Atrophie Blanche: No Crepitus:  No Cyanosis: No Excoriation: No Ecchymosis: No Induration: No Erythema: No Rash: No Hemosiderin Staining: No Scarring: No Mottled: No Pallor: No Moisture Rubor: No No Abnormalities Noted: No Dry / Scaly: No Temperature / Pain Maceration: Yes Tenderness on Palpation: Yes Wound Preparation Ulcer Cleansing: Rinsed/Irrigated with Saline Topical Anesthetic Applied: Other: lidocaine 4%, Treatment Notes Wound #10 (Left, Posterior Amputation Site - Below Knee) 1. Cleansed with: Clean wound with Normal Saline 2. Anesthetic Topical Lidocaine 4% cream to wound bed prior to debridement 4. Dressing Applied: Aquacel Ag 5. Secondary Dressing Applied Foam Kerlix/Conform 7. Secured with Secretary/administrator) Signed: 12/26/2016 4:09:04 PM By: Curtis Sites Entered By: Curtis Sites on 12/26/2016 08:46:36 [name], Richard AMarland Kitchen ([id number]) -------------------------------------------------------------------------------- Wound Assessment Details Patient Name: [name]. Date of Service: 12/26/2016 8:00 AM Medical Record Number: [id number] Patient Account Number: [id number] Date of Birth/Sex: 1966/04/03 (51 y.o. Male) Treating RN: Curtis Sites Primary Care Kaileena Obi: Angus Palms Other Clinician: Referring Amand Lemoine: Angus Palms Treating Binta Statzer/Extender: Rudene Re in Treatment: 0 Wound Status Wound Number: 11 Primary Diabetic Wound/Ulcer of the Lower Etiology: Extremity Wound Location: Left Amputation Site - Below Knee - Anterior Secondary Pressure Ulcer Etiology: Wounding Event: Pressure Injury Wound Open Date Acquired: 12/15/2016 Status: Weeks Of Treatment: 0 Comorbid Chronic sinus problems/congestion, Clustered Wound: No History: Anemia, Asthma,  Sleep Apnea, Pending Amputation On Presentation Coronary Artery Disease, Hypertension, Myocardial Infarction, Type II Diabetes, History of pressure wounds, Gout, Neuropathy Photos Wound Measurements Length: (cm) 0.2 Width: (cm) 0.2 Depth: (cm) 0.9 Area: (cm) 0.031 Volume: (cm) 0.028 % Reduction in Area: 0% % Reduction in Volume: 0% Epithelialization: None Tunneling: No Undermining: No Wound Description Classification: Grade 1 Foul Odor Afte Wound Margin: Flat and Intact Slough/Fibrino Exudate Amount: Large Exudate Type: Sanguinous Exudate Color: red r Cleansing: No No Wound Bed [name], [name]. ([id number]) Granulation Amount: Large (67-100%) Exposed Structure Granulation Quality: Pink Fascia Exposed: No Necrotic Amount: None Present (0%) Fat Layer (Subcutaneous Tissue) Exposed: No Tendon Exposed: No Muscle Exposed: No Joint Exposed: No Bone Exposed: No Limited to Skin Breakdown Periwound Skin Texture Texture Color No Abnormalities Noted: No No Abnormalities Noted: No Callus: No Atrophie Blanche: No Crepitus: No Cyanosis: No Excoriation: No Ecchymosis: No Induration: No Erythema: No Rash: No Hemosiderin Staining: No Scarring: No Mottled: No Pallor: No Moisture Rubor: No No Abnormalities Noted: No Dry / Scaly: No Temperature / Pain Maceration: No Tenderness on Palpation: Yes Wound Preparation Ulcer Cleansing: Rinsed/Irrigated with Saline Topical Anesthetic Applied: Other: lidocaine 4%, Treatment Notes Wound #11 (Left, Anterior Amputation Site - Below Knee) 1. Cleansed with: Clean wound with Normal Saline 2. Anesthetic Topical Lidocaine 4% cream to wound bed prior to debridement 4. Dressing Applied: Aquacel Ag 5. Secondary Dressing Applied Foam Kerlix/Conform 7. Secured with Secretary/administrator) Signed: 12/26/2016 4:09:04 PM By: Curtis Sites Entered By: Curtis Sites on 12/26/2016 08:46:58 [name], Richard AMarland Kitchen  ([id number]) -------------------------------------------------------------------------------- Vitals Details Patient Name: [name]. Date of Service: 12/26/2016 8:00 AM Medical Record Number: [id number] Patient Account Number: [id number] Date of Birth/Sex: Jan 04, 1966 (51 y.o. Male) Treating RN: Curtis Sites Primary Care Omolola Mittman: Angus Palms Other Clinician: Referring Malynn Lucy: Angus Palms Treating Levern Kalka/Extender: Rudene Re in Treatment: 0 Vital Signs Time Taken: 08:19 Temperature (F): 98.3 Height (in): 66 Pulse (bpm): 72 Source: Measured Respiratory Rate (breaths/min): 16 Weight (lbs): 282 Blood Pressure (mmHg): 129/71 Source: Measured Reference Range: 80 - 120 mg / dl Body Mass Index (BMI): 45.5 Electronic Signature(s) Signed: 12/26/2016 4:09:04 PM By: Curtis Sites Entered By: Francesco Sor,  Joanna on 12/26/2016 08:26:38

## 2017-01-01 ENCOUNTER — Ambulatory Visit
Admission: RE | Admit: 2017-01-01 | Discharge: 2017-01-01 | Disposition: A | Discharge status: Home / Self Care | Payer: PRIVATE HEALTH INSURANCE | Source: Ambulatory Visit | Attending: Specialist | Admitting: Specialist

## 2017-01-01 DIAGNOSIS — S39012A Strain of muscle, fascia and tendon of lower back, initial encounter: Secondary | ICD-10-CM | POA: Insufficient documentation

## 2017-01-01 DIAGNOSIS — M1288 Other specific arthropathies, not elsewhere classified, other specified site: Secondary | ICD-10-CM | POA: Diagnosis not present

## 2017-01-01 DIAGNOSIS — M48061 Spinal stenosis, lumbar region without neurogenic claudication: Secondary | ICD-10-CM | POA: Insufficient documentation

## 2017-01-01 DIAGNOSIS — X58XXXA Exposure to other specified factors, initial encounter: Secondary | ICD-10-CM | POA: Insufficient documentation

## 2017-01-07 DIAGNOSIS — I255 Ischemic cardiomyopathy: Secondary | ICD-10-CM | POA: Diagnosis not present

## 2017-01-07 DIAGNOSIS — E1129 Type 2 diabetes mellitus with other diabetic kidney complication: Secondary | ICD-10-CM | POA: Diagnosis not present

## 2017-01-07 DIAGNOSIS — Z794 Long term (current) use of insulin: Secondary | ICD-10-CM | POA: Diagnosis not present

## 2017-01-07 DIAGNOSIS — E039 Hypothyroidism, unspecified: Secondary | ICD-10-CM | POA: Diagnosis not present

## 2017-01-23 ENCOUNTER — Encounter: Payer: PPO | Attending: Surgery | Admitting: Surgery

## 2017-01-23 DIAGNOSIS — S81002S Unspecified open wound, left knee, sequela: Secondary | ICD-10-CM | POA: Insufficient documentation

## 2017-01-23 DIAGNOSIS — Z794 Long term (current) use of insulin: Secondary | ICD-10-CM | POA: Insufficient documentation

## 2017-01-23 DIAGNOSIS — Z79899 Other long term (current) drug therapy: Secondary | ICD-10-CM | POA: Diagnosis not present

## 2017-01-23 DIAGNOSIS — J45909 Unspecified asthma, uncomplicated: Secondary | ICD-10-CM | POA: Insufficient documentation

## 2017-01-23 DIAGNOSIS — E1140 Type 2 diabetes mellitus with diabetic neuropathy, unspecified: Secondary | ICD-10-CM | POA: Diagnosis not present

## 2017-01-23 DIAGNOSIS — G4733 Obstructive sleep apnea (adult) (pediatric): Secondary | ICD-10-CM | POA: Diagnosis not present

## 2017-01-23 DIAGNOSIS — X58XXXS Exposure to other specified factors, sequela: Secondary | ICD-10-CM | POA: Insufficient documentation

## 2017-01-23 DIAGNOSIS — L97821 Non-pressure chronic ulcer of other part of left lower leg limited to breakdown of skin: Secondary | ICD-10-CM | POA: Diagnosis not present

## 2017-01-23 DIAGNOSIS — D649 Anemia, unspecified: Secondary | ICD-10-CM | POA: Insufficient documentation

## 2017-01-23 DIAGNOSIS — M109 Gout, unspecified: Secondary | ICD-10-CM | POA: Insufficient documentation

## 2017-01-23 DIAGNOSIS — Z6841 Body Mass Index (BMI) 40.0 and over, adult: Secondary | ICD-10-CM | POA: Diagnosis not present

## 2017-01-23 DIAGNOSIS — I2510 Atherosclerotic heart disease of native coronary artery without angina pectoris: Secondary | ICD-10-CM | POA: Insufficient documentation

## 2017-01-23 DIAGNOSIS — I252 Old myocardial infarction: Secondary | ICD-10-CM | POA: Diagnosis not present

## 2017-01-23 DIAGNOSIS — L97919 Non-pressure chronic ulcer of unspecified part of right lower leg with unspecified severity: Secondary | ICD-10-CM | POA: Diagnosis not present

## 2017-01-23 DIAGNOSIS — I10 Essential (primary) hypertension: Secondary | ICD-10-CM | POA: Diagnosis not present

## 2017-01-23 DIAGNOSIS — M70862 Other soft tissue disorders related to use, overuse and pressure, left lower leg: Secondary | ICD-10-CM | POA: Insufficient documentation

## 2017-01-23 DIAGNOSIS — E11622 Type 2 diabetes mellitus with other skin ulcer: Secondary | ICD-10-CM | POA: Insufficient documentation

## 2017-01-23 DIAGNOSIS — L97122 Non-pressure chronic ulcer of left thigh with fat layer exposed: Secondary | ICD-10-CM | POA: Insufficient documentation

## 2017-01-24 NOTE — Progress Notes (Signed)
ORONDE, HALLENBECK ([id number]) Visit Report for 01/23/2017 Chief Complaint Document Details Patient Name: [name], [name] Date of Service: 01/23/2017 9:15 AM Medical Record Number: [id number] Patient Account Number: [id number] Date of Birth/Sex: 1966-09-05 (51 y.o. Male) Treating RN: Curtis Sites Primary Care Provider: Angus Palms Other Clinician: Referring Provider: Angus Palms Treating Provider/Extender: Rudene Re in Treatment: 4 Information Obtained from: Patient Chief Complaint the patient returns back to see [place] for his long-standing wounds of the left lower extremity in the region of his popliteal fossa due to pressure from his prosthesis and this has been an ongoing problem for over 2 years Electronic Signature(s) Signed: 01/23/2017 9:39:28 AM By: Evlyn Kanner MD, FACS Entered By: Evlyn Kanner on 01/23/2017 09:39:28 Neyhart, Donovon AMarland Kitchen ([id number]) -------------------------------------------------------------------------------- HPI Details Patient Name: Richard Kyle A. Date of Service: 01/23/2017 9:15 AM Medical Record Number: [id number] Patient Account Number: [id number] Date of Birth/Sex: 1966/02/23 (51 y.o. Male) Treating RN: Curtis Sites Primary Care Provider: Angus Palms Other Clinician: Referring Provider: Angus Palms Treating Provider/Extender: Rudene Re in Treatment: 4 History of Present Illness Location: left lower thigh posteriorly Quality: Patient reports experiencing a dull pain to affected area(s). Severity: Patient states wound are getting worse. Duration: Patient has had the wound for > 11 months prior to seeking treatment at the wound center Timing: Pain in wound is Intermittent (comes and goes Context: The wound occurred when the patient has been wearing a prosthesis on his left lower extremity for a left BKA Modifying Factors: Other treatment(s) tried include:working with the prosthetic and orthopedic department at  [facility] Associated Signs and Symptoms: Patient reports having foul odor. HPI Description: 51 year old gentleman who has diabetes mellitus, left below-knee amputation with previous revision in 2014 and multiple other morbidities including carotid artery disease, cardiac issues nephropathy obesity and peripheral vascular disease comes back to see as with ongoing problems with his left below-knee wounds in the region of the popliteal fossa and 1 anteriorly below the cut edge of his tibia. Nothing else has changed in his history except that the prosthesis which was made in September of last year has been discontinued due to causing him a lot of pressure injuries. He is working on his third prosthesis in the last 2 years. 01/23/2017 -- he tells me his latest A1c is 5.7%. He is going to set up an appointment to see the last excision at [facility] === Old notes: 51 year old gentleman with past medical history of diabetes mellitus with last hemoglobin A1c done in February 2016 was 8.7, hypertension, hypothyroidism, coronary artery disease, ischemic cardiomyopathy , nephropathy, obesity, peripheral neuropathy, peripheral vascular disease and sleep apnea. 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 He has had problems with his left BKA prosthesis for a long while and had a vacuum system and then had to be changed over to a sleeve system and has been working with the orthopedic and prosthetic department at [facility]. He has also had a plastic surgery opinion in the past. The ulceration on the posterior part of his lower thigh continued to be aggravating an enlarging and have a foul order and drainage. He works as a Financial risk analyst at [employer] and has to wear his prostheses and standing for long periods of time as he is the only finding member in the  family 03/06/2016 -- the notes from the [facility] and was seen  by Ms. Lubertha Basque the PA whonoted that he continue to wear his prosthesis against medical advice. details from his past surgical history notes that he's had a cardiac catheterization, coronary angioplasty, amputation of the left leg in 2012, debridement of skin [name], [name] ([id number]) and subcutis tissue in June 2013, application of wound VAC at that time, below knee amputation revision in April 2014. Recommendation was for a new socket to be made which should be above the new wound. He also recommended surgical revision of the redundant skin and to stay off the prosthesis to the wound heals but the patient would not be able to do this because of work. Hence he was advised to establish with a local wound clinic for management of his current wound and follow-up with the amputee clinic at [facility] or with Dr. Mindi Curling. If he decided to go back to them for surgery he should see Dr. Lanier Ensign. 03/27/2016 -- he was unable to get his prosthesis made at the place where they had begun to mold and hence he has to find himself a mother prosthetic department and is working on this. 04/10/2016 -- on his left popliteal fossa area he has got a small satellite abrasion with an ulceration which was caused by pulling off the socket on his skin. He still has to see his prosthetic department. 05/01/2016 -- today he comes with a new problem on his right foot which he says he's had for about 2 months and has been treated by his podiatrist Dr. Ovid Curd. Says due to the fact that his footwear and insoles were worn out, he has developed an ulcerated area which continues to drain sero- sanguinous fluid and has been on doxycycline for about 2 months. He saw Dr. Ardelle Anton who did an x-ray of the foot and has been following up with him for a long while regarding this and managing his Charcot's arthropathy right foot. Today the patient wanted me to take  a look at it and agrees to undergo treatment for this. 05/08/2016 -- I have reviewed notes from Dr. Vaughan Sine who saw him on 05/06/2016. after review he asked him to continue with local wound care and started him on ciprofloxacin as well as his doxycycline. note is made of the fact that on his previous visit on 02/01/2016 x-rays were done which showed chronic arthritic changes present in the rear foot due to Charcot deformity. Vessel occification is present. 06/12/2016 -- the patient has not yet got his left below-knee prostheses sleeve which was to be made afresh. He has also not received his right foot orthotic shoe which was to be made at Dr. Kenna Gilbert office. It has been over 2 months since he keeps telling me that he is going to get this done soon. 07/17/2016 -- shunt has not been to see as in 5 weeks and continues to have issues trying to get his prosthesis for his left below-knee amputation. He has got new superficial abrasion on the medial part of his left knee and this is separate from the large ulceration in the popliteal fossa. Did get his inserts for his diabetic shoe for his right foot and this wound is healed. 08/07/2016 -- patient comes after 3 weeks and has a new wound on his left knee just over the patella from a new prosthesis which she tried for about a week. He is back to wearing his old prostheses at present, which he will use still he gets the new one back. 10-16-2016; Mr. Hallum  is a 51 year old male who presents today for follow-up on his diabetic ulcers. It has been greater than 30 days since his last visit here less making him a new patient. He has a history of type 2 diabetes, hypertension, CHF, hyperlipidemia, MI, coronary angioplasty with stent, hypothyroidism, CAD, ischemic cardiomyopathy, CKG stage IV, obesity, peripheral neuropathy, PVD, sleep apnea with CPAP use, tracheostomy. He is status post left BKA in 2012 with multiple debridements and a revision in  2014. He is a long-standing history with complications with the left leg prosthesis, being ill-fated and aggravating the ulcer to his posterior left leg. Most of his care has been performed at [facility]. According to documentation he continued to wear this prosthesis AGAINST MEDICAL ADVICE. According to Mr. Segundo he does have a new [place] but he states that he cannot get hold of the orthotist for fitting, he was strongly encouraged to continue contact with this company to get his new prosthesis. It appears that he had a fall at work and went to the emergency department on 09/12/2016, since then he states that he has continued to work, wearing the ill-fitted prosthesis. He is the sole provider for his family. A recent follow-up appointment at [facility] a POC A1c was performed on 06/20/2013 and it was 7.9%, prior to that in 12/25/2015 it was 7.8%. He states he has been doing all of his dressing changes with silver alginate. He denies and there is no Standish, Sava A. ([id number]) record of any radiographic studies or vascular studies to the left leg. He is currently on no antibiotic therapy. == Electronic Signature(s) Signed: 01/23/2017 9:40:35 AM By: Evlyn Kanner MD, FACS Entered By: Evlyn Kanner on 01/23/2017 09:40:35 Galvis, Amol AMarland Kitchen ([id number]) -------------------------------------------------------------------------------- Physical Exam Details Patient Name: Richard Kyle A. Date of Service: 01/23/2017 9:15 AM Medical Record Number: [id number] Patient Account Number: [id number] Date of Birth/Sex: 08/03/1966 (51 y.o. Male) Treating RN: Curtis Sites Primary Care Provider: Angus Palms Other Clinician: Referring Provider: Angus Palms Treating Provider/Extender: Rudene Re in Treatment: 4 Constitutional . Pulse regular. Respirations normal and unlabored. Afebrile. . Eyes Nonicteric. Reactive to light. Ears, Nose, Mouth, and  Throat Lips, teeth, and gums WNL.Marland Kitchen Moist mucosa without lesions. Neck supple and nontender. No palpable supraclavicular or cervical adenopathy. Normal sized without goiter. Respiratory WNL. No retractions.. Breath sounds WNL, No rubs, rales, rhonchi, or wheeze.. Cardiovascular Heart rhythm and rate regular, no murmur or gallop.. Pedal Pulses WNL. No clubbing, cyanosis or edema. Chest Breasts symmetical and no nipple discharge.. Breast tissue WNL, no masses, lumps, or tenderness.. Lymphatic No adneopathy. No adenopathy. No adenopathy. Musculoskeletal Adexa without tenderness or enlargement.. Digits and nails w/o clubbing, cyanosis, infection, petechiae, ischemia, or inflammatory conditions.. Integumentary (Hair, Skin) No suspicious lesions. No crepitus or fluctuance. No peri-wound warmth or erythema. No masses.Marland Kitchen Psychiatric Judgement and insight Intact.. No evidence of depression, anxiety, or agitation.. Notes the sinus tract and not be packed any longer and looks better. The wound in the posterior below the popliteal fossa continues to have callus and rolled edges and the granulation tissue is clean and no sharp debridement was required today. Electronic Signature(s) Signed: 01/23/2017 9:41:31 AM By: Evlyn Kanner MD, FACS Entered By: Evlyn Kanner on 01/23/2017 09:41:31 Matsumoto, Jens Som ([id number]) -------------------------------------------------------------------------------- Physician Orders Details Patient Name: Richard Kyle A. Date of Service: 01/23/2017 9:15 AM Medical Record Number: [id number] Patient Account Number: [id number] Date of Birth/Sex: [birth year] (51 y.o. Male) Treating RN: Curtis Sites Primary Care Provider: Angus Palms Other  Clinician: Referring Provider: Angus Palms Treating Provider/Extender: Rudene Re in Treatment: 4 Verbal / Phone Orders: No Diagnosis Coding Wound Cleansing Wound #10 Left,Posterior Amputation Site - Below Knee o  Clean wound with Normal Saline. Wound #11 Left,Anterior Amputation Site - Below Knee o Clean wound with Normal Saline. Anesthetic Wound #10 Left,Posterior Amputation Site - Below Knee o Topical Lidocaine 4% cream applied to wound bed prior to debridement Wound #11 Left,Anterior Amputation Site - Below Knee o Topical Lidocaine 4% cream applied to wound bed prior to debridement Primary Wound Dressing Wound #10 Left,Posterior Amputation Site - Below Knee o Aquacel Ag Wound #11 Left,Anterior Amputation Site - Below Knee o Aquacel Ag Secondary Dressing Wound #10 Left,Posterior Amputation Site - Below Knee o Conform/Kerlix o Foam Wound #11 Left,Anterior Amputation Site - Below Knee o Other - coverlet or telfa island Dressing Change Frequency Wound #10 Left,Posterior Amputation Site - Below Knee o Change dressing every other day. Wound #11 Left,Anterior Amputation Site - Below Knee o Change dressing every other day. TASHEEM, ELMS ([id number]) Follow-up Appointments Wound #10 Left,Posterior Amputation Site - Below Knee o Other: - 1 month Wound #11 Left,Anterior Amputation Site - Below Knee o Other: - 1 month Off-Loading Wound #10 Left,Posterior Amputation Site - Below Knee o Other: - take pressure off your leg as much as possible Wound #11 Left,Anterior Amputation Site - Below Knee o Other: - take pressure off your leg as much as possible Additional Orders / Instructions Wound #10 Left,Posterior Amputation Site - Below Knee o Increase protein intake. o Other: - add vitamin c, vitamin a, and zinc supplements to your diet Wound #11 Left,Anterior Amputation Site - Below Knee o Increase protein intake. o Other: - add vitamin c, vitamin a, and zinc supplements to your diet Electronic Signature(s) Signed: 01/23/2017 3:56:49 PM By: Evlyn Kanner MD, FACS Signed: 01/23/2017 5:14:48 PM By: Curtis Sites Entered By: Curtis Sites on 01/23/2017  09:35:35 Freese, Kwasi A. ([id number]) -------------------------------------------------------------------------------- Problem List Details Patient Name: Richard Kyle A. Date of Service: 01/23/2017 9:15 AM Medical Record Number: [id number] Patient Account Number: [id number] Date of Birth/Sex: 11-Sep-1966 (51 y.o. 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 12/26/2016 Yes exposed M70.862 Other soft tissue disorders related to use, overuse and 12/26/2016 Yes pressure, left lower leg S81.002S Unspecified open wound, left knee, sequela 12/26/2016 Yes E66.01 Morbid (severe) obesity due to excess calories 12/26/2016 Yes Inactive Problems Resolved Problems Electronic Signature(s) Signed: 01/23/2017 9:39:17 AM By: Evlyn Kanner MD, FACS Entered By: Evlyn Kanner on 01/23/2017 09:39:17 Eno, Zayvian AMarland Kitchen ([id number]) -------------------------------------------------------------------------------- Progress Note Details Patient Name: Richard Kyle A. Date of Service: 01/23/2017 9:15 AM Medical Record Number: [id number] Patient Account Number: [id number] Date of Birth/Sex: 08/17/1966 (51 y.o. Male) Treating RN: Curtis Sites Primary Care Provider: Angus Palms Other Clinician: Referring Provider: Angus Palms Treating Provider/Extender: Rudene Re in Treatment: 4 Subjective Chief Complaint Information obtained from Patient the patient returns back to see [place] for his long-standing wounds of the left lower extremity in the region of his popliteal fossa due to pressure from his prosthesis and this has been an ongoing problem for over 2  years History of Present Illness (HPI) The following HPI elements were documented for the patient's wound: Location:  left lower thigh posteriorly Quality: Patient reports experiencing a dull pain to affected area(s). Severity: Patient states wound are getting worse. Duration: Patient has had the wound for > 11 months prior to seeking treatment at the wound center Timing: Pain in wound is Intermittent (comes and goes Context: The wound occurred when the patient has been wearing a prosthesis on his left lower extremity for a left BKA Modifying Factors: Other treatment(s) tried include:[facility] Associated Signs and Symptoms: Patient reports having foul odor. 51 year old gentleman who has diabetes mellitus, left below-knee amputation with previous revision in 2014 and multiple other morbidities including carotid artery disease, cardiac issues nephropathy obesity and peripheral vascular disease comes back to see as with ongoing problems with his left below-knee wounds in the region of the popliteal fossa and 1 anteriorly below the cut edge of his tibia. Nothing else has changed in his history except that the prosthesis which was made in September of last year has been discontinued due to causing him a lot of pressure injuries. He is working on his third prosthesis in the last 2 years. 01/23/2017 -- he tells me his latest A1c is 5.7%. He is going to set up an appointment to see the last excision at [facility] === Old notes: 51 year old gentleman with past medical history of diabetes mellitus with last hemoglobin A1c done in February 2016 was 8.7, hypertension, hypothyroidism, coronary artery disease, ischemic cardiomyopathy , nephropathy, obesity, peripheral neuropathy, peripheral vascular disease and sleep apnea. He is status post coronary angioplasty, amputation of left leg in 2012, multiple debridement of skin and subcutaneous tissue on  the left BKA site, tracheostomy, revision of his left BKA in 2014. He has had problems with his left BKA prosthesis for a long while and had a vacuum system and then had to be changed over to a sleeve system and has been working with the orthopedic and prosthetic Goethe, Kannon A. ([id number]) department at [facility]. He has also had a plastic surgery opinion in the past. The ulceration on the posterior part of his lower thigh continued to be aggravating an enlarging and have a foul order and drainage. 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 Lubertha BasqueJennifer Dye the PA whonoted that he continue to wear his prosthesis against medical advice. details from his past surgical history notes that he's had a cardiac catheterization, coronary angioplasty, amputation of the left leg in 2012, debridement of skin and subcutis tissue in June 2013, application of wound VAC at that time, below knee amputation revision in April 2014. Recommendation was for a new socket to be made which should be above the new wound. He also recommended surgical revision of the redundant skin and to stay off the prosthesis to the wound heals but the patient would not be able to do this because of work. Hence he was advised to establish with a local wound clinic for management of his current wound and follow-up with [facility] or with Dr. Mindi Curlingawney. If he decided to go back to them for surgery he should see Dr. Lanier Ensigneilley. 03/27/2016 -- he was unable to get his prosthesis made at the place where they had begun to mold and hence he has to find himself a mother prosthetic department and  is working on this. 04/10/2016 -- on his left popliteal fossa area he has got a small satellite abrasion with an ulceration which was caused by pulling off the socket on his  skin. He still has to see his prosthetic department. 05/01/2016 -- today he comes with a new problem on his right foot which he says he's had for about 2 months and has been treated by his podiatrist Dr. Ovid Curd. Says due to the fact that his footwear and insoles were worn out, he has developed an ulcerated area which continues to drain sero- sanguinous fluid and has been on doxycycline for about 2 months. He saw Dr. Ardelle Anton who did an x-ray of the foot and has been following up with him for a long while regarding this and managing his Charcot's arthropathy right foot. Today the patient wanted me to take a look at it and agrees to undergo treatment for this. 05/08/2016 -- I have reviewed notes from Dr. Vaughan Sine who saw him on 05/06/2016. after review he asked him to continue with local wound care and started him on ciprofloxacin as well as his doxycycline. note is made of the fact that on his previous visit on 02/01/2016 x-rays were done which showed chronic arthritic changes present in the rear foot due to Charcot deformity. Vessel occification is present. 06/12/2016 -- the patient has not yet got his left below-knee prostheses sleeve which was to be made afresh. He has also not received his right foot orthotic shoe which was to be made at Dr. Kenna Gilbert office. It has been over 2 months since he keeps telling me that he is going to get this done soon. 07/17/2016 -- shunt has not been to see as in 5 weeks and continues to have issues trying to get his prosthesis for his left below-knee amputation. He has got new superficial abrasion on the medial part of his left knee and this is separate from the large ulceration in the popliteal fossa. Did get his inserts for his diabetic shoe for his right foot and this wound is healed. 08/07/2016 -- patient comes after 3 weeks and has a new wound on his left knee just over the patella from a new prosthesis which she tried for about a week. He is  back to wearing his old prostheses at present, which he will use still he gets the new one back. 10-16-2016; Mr. Hingle is a 51 year old male who presents today for follow-up on his diabetic ulcers. It has been greater than 30 days since his last visit here less making him a new patient. He has a history of type 2 diabetes, hypertension, CHF, hyperlipidemia, MI, coronary angioplasty with stent, hypothyroidism, CAD, ischemic cardiomyopathy, CKG stage IV, obesity, peripheral neuropathy, PVD, sleep apnea with CPAP use, tracheostomy. He is status post left BKA in 2012 with multiple debridements and a revision in 2014. He is a long-standing history with complications with the left leg prosthesis, being ill-fated and aggravating the ulcer to his posterior left leg. Most of his care has been performed at [facility] According to documentation he continued to wear this prosthesis AGAINST MEDICAL ADVICE. According ALISTER, STAVER ([id number]) to Mr. Pollio he does have a new prosthesis that is in the [place] office but he states that he cannot get hold of the orthotist for fitting, he was strongly encouraged to continue contact with this company to get his new prosthesis. It appears that he had a fall at work and went to the emergency department on  09/12/2016, since then he states that he has continued to work, wearing the ill-fitted prosthesis. He is the sole provider for his family. A recent follow-up appointment at [facility] a POC A1c was performed on 06/20/2013 and it was 7.9%, prior to that in 12/25/2015 it was 7.8%. He states he has been doing all of his dressing changes with silver alginate. He denies and there is no record of any radiographic studies or vascular studies to the left leg. He is currently on no antibiotic therapy. == Objective Constitutional Pulse regular. Respirations normal and unlabored. Afebrile. Vitals Time Taken: 9:15 AM, Height: 66 in, Weight: 282 lbs, BMI: 45.5,  Temperature: 98.1 F, Pulse: 79 bpm, Respiratory Rate: 18 breaths/min, Blood Pressure: 155/98 mmHg. Eyes Nonicteric. Reactive to light. Ears, Nose, Mouth, and Throat Lips, teeth, and gums WNL.Marland Kitchen Moist mucosa without lesions. Neck supple and nontender. No palpable supraclavicular or cervical adenopathy. Normal sized without goiter. Respiratory WNL. No retractions.. Breath sounds WNL, No rubs, rales, rhonchi, or wheeze.. Cardiovascular Heart rhythm and rate regular, no murmur or gallop.. Pedal Pulses WNL. No clubbing, cyanosis or edema. Chest Breasts symmetical and no nipple discharge.. Breast tissue WNL, no masses, lumps, or tenderness.. Lymphatic No adneopathy. No adenopathy. No adenopathy. Musculoskeletal Adexa without tenderness or enlargement.. Digits and nails w/o clubbing, cyanosis, infection, petechiae, ischemia, or inflammatory conditions.Marland Kitchen Greenleaf, Fadi A. ([id number]) Psychiatric Judgement and insight Intact.. No evidence of depression, anxiety, or agitation.. General Notes: the sinus tract and not be packed any longer and looks better. The wound in the posterior below the popliteal fossa continues to have callus and rolled edges and the granulation tissue is clean and no sharp debridement was required today. Integumentary (Hair, Skin) No suspicious lesions. No crepitus or fluctuance. No peri-wound warmth or erythema. No masses.. Wound #10 status is Open. Original cause of wound was Pressure Injury. The wound is located on the Left,Posterior Amputation Site - Below Knee. The wound measures 2.3cm length x 7.5cm width x 0.5cm depth; 13.548cm^2 area and 6.774cm^3 volume. The wound is limited to skin breakdown. There is no tunneling or undermining noted. There is a large amount of serous drainage noted. The wound margin is flat and intact. There is large (67-100%) pink granulation within the wound bed. There is a small (1-33%) amount of necrotic tissue within the wound bed  including Adherent Slough. The periwound skin appearance exhibited: Maceration. The periwound skin appearance did not exhibit: Callus, Crepitus, Excoriation, Induration, Rash, Scarring, Dry/Scaly, Atrophie Blanche, Cyanosis, Ecchymosis, Hemosiderin Staining, Mottled, Pallor, Rubor, Erythema. The periwound has tenderness on palpation. Wound #11 status is Open. Original cause of wound was Pressure Injury. The wound is located on the Left,Anterior Amputation Site - Below Knee. The wound measures 0.1cm length x 0.1cm width x 0.9cm depth; 0.008cm^2 area and 0.007cm^3 volume. The wound is limited to skin breakdown. There is no tunneling or undermining noted. There is a large amount of sanguinous drainage noted. The wound margin is flat and intact. There is large (67-100%) pink granulation within the wound bed. There is no necrotic tissue within the wound bed. The periwound skin appearance did not exhibit: Callus, Crepitus, Excoriation, Induration, Rash, Scarring, Dry/Scaly, Maceration, Atrophie Blanche, Cyanosis, Ecchymosis, Hemosiderin Staining, Mottled, Pallor, Rubor, Erythema. The periwound has tenderness on palpation. 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 ([id number]) Wound Cleansing: Wound #10 Left,Posterior Amputation Site - Below Knee: Clean wound with Normal Saline. Wound #11 Left,Anterior Amputation Site - Below Knee: Clean wound with Normal Saline. Anesthetic: Wound #10 Left,Posterior Amputation Site - Below Knee: Topical Lidocaine 4% cream applied to wound bed prior to debridement Wound #11 Left,Anterior Amputation Site - Below  Knee: Topical Lidocaine 4% cream applied to wound bed prior to debridement Primary Wound Dressing: Wound #10 Left,Posterior Amputation Site - Below Knee: Aquacel Ag Wound #11 Left,Anterior Amputation Site - Below Knee: Aquacel Ag Secondary Dressing: Wound #10 Left,Posterior Amputation Site - Below Knee: Conform/Kerlix Foam Wound #11 Left,Anterior Amputation Site - Below Knee: Other - coverlet or telfa island Dressing Change Frequency: Wound #10 Left,Posterior Amputation Site - Below Knee: Change dressing every other day. Wound #11 Left,Anterior Amputation Site - Below Knee: Change dressing every other day. Follow-up Appointments: Wound #10 Left,Posterior Amputation Site - Below Knee: Other: - 1 month Wound #11 Left,Anterior Amputation Site - Below Knee: Other: - 1 month Off-Loading: Wound #10 Left,Posterior Amputation Site - Below Knee: Other: - take pressure off your leg as much as possible Wound #11 Left,Anterior Amputation Site - Below Knee: Other: - take pressure off your leg as much as possible Additional Orders / Instructions: Wound #10 Left,Posterior Amputation Site - Below Knee: Increase protein intake. Other: - add vitamin c, vitamin a, and zinc supplements to your diet Wound #11 Left,Anterior Amputation Site - Below Knee: Increase protein intake. Other: - add vitamin c, vitamin a, and zinc supplements to your diet Pardo, Aisea A. ([id number]) This is a complex care patient who is noncompliant and returns to [place] from time to time, so that he can get his supplies, but does not heed our advice, nor does he take the recommended consultations seriously. I have recommended silver alginate to the popliteal fossa and protected with bordered foam and offloading as much as possible. I have strongly recommended a plastic surgery consult at [facility] where he's had his previous surgery. This has been suggested to him in the past, but he never follows our advise. All  other supportive care which we have been discussing with him for a long while has been reemphasized. Electronic Signature(s) Signed: 01/23/2017 9:43:28 AM By: Evlyn Kanner MD, FACS Entered By: Evlyn Kanner on 01/23/2017 09:43:27 Lanphier, Domonick AMarland Kitchen ([id number]) -------------------------------------------------------------------------------- SuperBill Details Patient Name: Richard Kyle A. Date of Service: 01/23/2017 Medical Record Number: [id number] Patient Account Number: [id number] Date of Birth/Sex: Aug 15, 1966 (51 y.o. 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 M70.862 Other soft tissue disorders related to use, overuse and pressure, left lower leg S81.002S Unspecified open wound, left knee, sequela E66.01 Morbid (severe) obesity due to excess calories Facility Procedures CPT4 Code: [id number] Description: 99213 - WOUND CARE VISIT-LEV 3 EST PT Modifier: Quantity: 1 Physician Procedures CPT4: Description Modifier Quantity Code [id number] 99213 - WC PHYS LEVEL 3 - EST PT 1 ICD-10 Description Diagnosis E11.622 Type 2 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

## 2017-01-24 NOTE — Progress Notes (Addendum)
YUVAAN, OLANDER ([id number]) Visit Report for 01/23/2017 Arrival Information Details Patient Name: DIVIT, STIPP A. Date of Service: 01/23/2017 9:15 AM Medical Record Number: [id number] Patient Account Number: [id number] Date of Birth/Sex: Aug 29, 1966 (51 y.o. Male) Treating RN: Curtis Sites Primary Care Genessis Flanary: Angus Palms Other Clinician: Referring Dedria Endres: Angus Palms Treating Emma-Lee Oddo/Extender: Rudene Re in Treatment: 4 Visit Information History Since Last Visit Added or deleted any medications: No Patient Arrived: Ambulatory Any new allergies or adverse reactions: No Arrival Time: 09:14 Had a fall or experienced change in No Accompanied By: self activities of daily living that may affect Transfer Assistance: None risk of falls: Patient Identification Verified: Yes Signs or symptoms of abuse/neglect since last No Secondary Verification Process Yes visito Completed: Hospitalized since last visit: No Patient Has Alerts: Yes Has Dressing in Place as Prescribed: Yes Patient Alerts: DMII Pain Present Now: No Electronic Signature(s) Signed: 01/23/2017 5:14:48 PM By: Curtis Sites Entered By: Curtis Sites on 01/23/2017 09:14:54 Biondolillo, Bartt A. ([id number]) -------------------------------------------------------------------------------- Clinic Level of Care Assessment Details Patient Name: [name] Date of Service: 01/23/2017 9:15 AM Medical Record Number: [id number] Patient Account Number: [id number] Date of Birth/Sex: 11-13-1966 (51 y.o. Male) Treating RN: Curtis Sites Primary Care Terrina Docter: Angus Palms Other Clinician: Referring Vanissa Strength: Angus Palms Treating Keirston Saephanh/Extender: Rudene Re in Treatment: 4 Clinic Level of Care Assessment Items TOOL 4 Quantity Score []  - Use when only an EandM is performed on FOLLOW-UP visit 0 ASSESSMENTS - Nursing Assessment / Reassessment X - Reassessment of Co-morbidities (includes  updates in patient status) 1 10 X - Reassessment of Adherence to Treatment Plan 1 5 ASSESSMENTS - Wound and Skin Assessment / Reassessment []  - Simple Wound Assessment / Reassessment - one wound 0 X - Complex Wound Assessment / Reassessment - multiple wounds 2 5 []  - Dermatologic / Skin Assessment (not related to wound area) 0 ASSESSMENTS - Focused Assessment []  - Circumferential Edema Measurements - multi extremities 0 []  - Nutritional Assessment / Counseling / Intervention 0 []  - Lower Extremity Assessment (monofilament, tuning fork, pulses) 0 []  - Peripheral Arterial Disease Assessment (using hand held doppler) 0 ASSESSMENTS - Ostomy and/or Continence Assessment and Care []  - Incontinence Assessment and Management 0 []  - Ostomy Care Assessment and Management (repouching, etc.) 0 PROCESS - Coordination of Care X - Simple Patient / Family Education for ongoing care 1 15 []  - Complex (extensive) Patient / Family Education for ongoing care 0 []  - Staff obtains Chiropractor, Records, Test Results / Process Orders 0 []  - Staff telephones HHA, Nursing Homes / Clarify orders / etc 0 []  - Routine Transfer to another Facility (non-emergent condition) 0 Macinnes, Petar A. ([id number]) []  - Routine Hospital Admission (non-emergent condition) 0 []  - New Admissions / Manufacturing engineer / Ordering NPWT, Apligraf, etc. 0 []  - Emergency Hospital Admission (emergent condition) 0 X - Simple Discharge Coordination 1 10 []  - Complex (extensive) Discharge Coordination 0 PROCESS - Special Needs []  - Pediatric / Minor Patient Management 0 []  - Isolation Patient Management 0 []  - Hearing / Language / Visual special needs 0 []  - Assessment of Community assistance (transportation, D/C planning, etc.) 0 []  - Additional assistance / Altered mentation 0 []  - Support Surface(s) Assessment (bed, cushion, seat, etc.) 0 INTERVENTIONS - Wound Cleansing / Measurement []  - Simple Wound Cleansing - one wound 0 X -  Complex Wound Cleansing - multiple wounds 2 5 X - Wound Imaging (photographs - any number of wounds) 1 5 []  - Wound Tracing (instead  of photographs) 0 []  - Simple Wound Measurement - one wound 0 X - Complex Wound Measurement - multiple wounds 2 5 INTERVENTIONS - Wound Dressings X - Small Wound Dressing one or multiple wounds 2 10 []  - Medium Wound Dressing one or multiple wounds 0 []  - Large Wound Dressing one or multiple wounds 0 []  - Application of Medications - topical 0 []  - Application of Medications - injection 0 INTERVENTIONS - Miscellaneous []  - External ear exam 0 Evers, Refugio A. ([id number]) []  - Specimen Collection (cultures, biopsies, blood, body fluids, etc.) 0 []  - Specimen(s) / Culture(s) sent or taken to Lab for analysis 0 []  - Patient Transfer (multiple staff / Michiel Sites Lift / Similar devices) 0 []  - Simple Staple / Suture removal (25 or less) 0 []  - Complex Staple / Suture removal (26 or more) 0 []  - Hypo / Hyperglycemic Management (close monitor of Blood Glucose) 0 []  - Ankle / Brachial Index (ABI) - do not check if billed separately 0 X - Vital Signs 1 5 Has the patient been seen at the hospital within the last three years: Yes Total Score: 100 Level Of Care: New/Established - Level 3 Electronic Signature(s) Signed: 01/23/2017 5:14:48 PM By: Curtis Sites Entered By: Curtis Sites on 01/23/2017 09:36:15 Hedgecock, Breylon AMarland Kitchen ([id number]) -------------------------------------------------------------------------------- Encounter Discharge Information Details Patient Name: [name] Date of Service: 01/23/2017 9:15 AM Medical Record Number: [id number] Patient Account Number: [id number] Date of Birth/Sex: 22-Aug-1966 (51 y.o. Male) Treating RN: Curtis Sites Primary Care Brooklyn Jeff: Angus Palms Other Clinician: Referring Kalisi Bevill: Angus Palms Treating Avangelina Flight/Extender: Rudene Re in Treatment: 4 Encounter Discharge Information Items Discharge  Pain Level: 0 Discharge Condition: Stable Ambulatory Status: Ambulatory Discharge Destination: Home Transportation: Private Auto Accompanied By: self Schedule Follow-up Appointment: Yes Medication Reconciliation completed and provided to Patient/Care No Esmond Hinch: Provided on Clinical Summary of Care: 01/23/2017 Form Type Recipient Paper Patient DP Electronic Signature(s) Signed: 01/23/2017 9:58:53 AM By: Curtis Sites Previous Signature: 01/23/2017 9:49:57 AM Version By: Gwenlyn Perking Entered By: Curtis Sites on 01/23/2017 09:58:53 Sherburn, Deep A. ([id number]) -------------------------------------------------------------------------------- Multi Wound Chart Details Patient Name: [name] Date of Service: 01/23/2017 9:15 AM Medical Record Number: [id number] Patient Account Number: [id number] Date of Birth/Sex: [birth year] (51 y.o. Male) Treating RN: Curtis Sites Primary Care Zelena Bushong: Angus Palms Other Clinician: Referring Quintyn Dombek: Angus Palms Treating Gaetan Spieker/Extender: Rudene Re in Treatment: 4 Vital Signs Height(in): 66 Pulse(bpm): 79 Weight(lbs): 282 Blood Pressure 155/98 (mmHg): Body Mass Index(BMI): 46 Temperature(F): 98.1 Respiratory Rate 18 (breaths/min): Photos: [10:No Photos] [11:No Photos] [N/A:N/A] Wound Location: [10:Left Amputation Site - Below Knee - Posterior] [11:Left Amputation Site - Below Knee - Anterior] [N/A:N/A] Wounding Event: [10:Pressure Injury] [11:Pressure Injury] [N/A:N/A] 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] [11:the Lower Extremity] 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] [11:Pressure Ulcer] [N/A:N/A] Comorbid History: [10:Chronic sinus problems/congestion, Anemia, Asthma, Sleep Anemia, Asthma, Sleep Apnea, Coronary Artery Apnea, Coronary Artery Disease, Hypertension, Myocardial Infarction, Type II Diabetes, History Type II Diabetes, History of  pressure  wounds, Gout, of pressure wounds, Gout, Neuropathy] [11:Chronic sinus problems/congestion, Disease, Hypertension, Myocardial Infarction, Neuropathy] [N/A:N/A] Date Acquired: [10:12/26/2014] [11:12/15/2016] [N/A:N/A] Weeks of Treatment: [10:4] [11:4] [N/A:N/A] Wound Status: [10:Open] [11:Open] [N/A:N/A] Pending Amputation on Yes [11:Yes] [N/A:N/A] Presentation: Measurements L x W x D 2.3x7.5x0.5 [11:0.1x0.1x0.9] [N/A:N/A] (cm) Area (cm) : [10:13.548] [11:0.008] [N/A:N/A] Volume (cm) : [10:6.774] [11:0.007] [N/A:N/A] % Reduction in Area: [10:20.10%] [11:74.20%] [N/A:N/A] % Reduction in Volume: 0.20% [11:75.00%] [N/A:N/A] Classification: [10:Grade 1] [11:Grade 1] [N/A:N/A] Exudate  Amount: [10:Large] [11:Large] [N/A:N/A] Exudate Type: [10:Serous] [11:Sanguinous] [N/A:N/A] Exudate Color: amber red N/A Wound Margin: Flat and Intact Flat and Intact N/A Granulation Amount: Large (67-100%) Large (67-100%) N/A Granulation Quality: Pink Pink N/A Necrotic Amount: Small (1-33%) None Present (0%) N/A Exposed Structures: Fascia: No Fascia: No N/A Fat Layer (Subcutaneous Fat Layer (Subcutaneous Tissue) Exposed: No Tissue) Exposed: No Tendon: No Tendon: No Muscle: No Muscle: No Joint: No Joint: No Bone: No Bone: No Limited to Skin Limited to Skin Breakdown Breakdown Epithelialization: None None N/A Periwound Skin Texture: Excoriation: No Excoriation: No N/A Induration: No Induration: No Callus: No Callus: No Crepitus: No Crepitus: No Rash: No Rash: No Scarring: No Scarring: No Periwound Skin Maceration: Yes Maceration: No N/A Moisture: Dry/Scaly: No Dry/Scaly: No Periwound Skin Color: Atrophie Blanche: No Atrophie Blanche: No N/A Cyanosis: No Cyanosis: No Ecchymosis: No Ecchymosis: No Erythema: No Erythema: No Hemosiderin Staining: No Hemosiderin Staining: No Mottled: No Mottled: No Pallor: No Pallor: No Rubor: No Rubor: No Tenderness on Yes Yes  N/A Palpation: Wound Preparation: Ulcer Cleansing: Ulcer Cleansing: N/A Rinsed/Irrigated with Rinsed/Irrigated with Saline Saline Topical Anesthetic Topical Anesthetic Applied: Other: lidocaine Applied: Other: lidocaine 4% 4% Treatment Notes Electronic Signature(s) Signed: 01/23/2017 9:39:22 AM By: Evlyn Kanner MD, FACS Entered By: Evlyn Kanner on 01/23/2017 09:39:22 Malicoat, Tahjae AMarland Kitchen ([id number]) -------------------------------------------------------------------------------- Multi-Disciplinary Care Plan Details Patient Name: [name] Date of Service: 01/23/2017 9:15 AM Medical Record Number: [id number] Patient Account Number: [id number] Date of Birth/Sex: 07/18/1966 (51 y.o. Male) Treating RN: Curtis Sites Primary Care Bradon Fester: Angus Palms Other Clinician: Referring Ricquel Foulk: Angus Palms Treating Shayley Medlin/Extender: Rudene Re in Treatment: 4 Active Inactive Electronic Signature(s) Signed: 02/25/2017 9:13:48 AM By: Elliot Gurney RN, BSN, Kim RN, BSN Signed: 02/26/2017 4:56:22 PM By: Curtis Sites Previous Signature: 01/23/2017 5:14:48 PM Version By: Curtis Sites Entered By: Elliot Gurney RN, BSN, Kim on 02/25/2017 09:13:47 Sisley, Niki A. ([id number]) -------------------------------------------------------------------------------- Pain Assessment Details Patient Name: [name] Date of Service: 01/23/2017 9:15 AM Medical Record Number: [id number] Patient Account Number: [id number] Date of Birth/Sex: June 16, 1966 (51 y.o. Male) Treating RN: Curtis Sites Primary Care Kashmir Leedy: Angus Palms Other Clinician: Referring Cherelle Midkiff: Angus Palms Treating Mozella Rexrode/Extender: Rudene Re in Treatment: 4 Active Problems Location of Pain Severity and Description of Pain Patient Has Paino Yes Site Locations Pain Location: Pain in Ulcers With Dressing Change: Yes Duration of the Pain. Constant / Intermittento Constant Pain Management and  Medication Current Pain Management: Notes Topical or injectable lidocaine is offered to patient for acute pain when surgical debridement is performed. If needed, Patient is instructed to use over the counter pain medication for the following 24-48 hours after debridement. Wound care MDs do not prescribed pain medications. Patient has chronic pain or uncontrolled pain. Patient has been instructed to make an appointment with their Primary Care Physician for pain management. Electronic Signature(s) Signed: 01/23/2017 5:14:48 PM By: Curtis Sites Entered By: Curtis Sites on 01/23/2017 09:15:11 Arnell, Tilak AMarland Kitchen ([id number]) -------------------------------------------------------------------------------- Patient/Caregiver Education Details Patient Name: [name] Date of Service: 01/23/2017 9:15 AM Medical Record Number: [id number] Patient Account Number: [id number] Date of Birth/Gender: 1965-11-21 (51 y.o. Male) Treating RN: Curtis Sites Primary Care Physician: Angus Palms Other Clinician: Referring Physician: Angus Palms Treating Physician/Extender: Rudene Re in Treatment: 4 Education Assessment Education Provided To: Patient Education Topics Provided Wound/Skin Impairment: Handouts: Other: wound care as ordered Methods: Demonstration, Explain/Verbal Responses: State content correctly Electronic Signature(s) Signed: 01/23/2017 5:14:48 PM By: Curtis Sites Entered By: Curtis Sites on 01/23/2017 09:59:52 Victorio, Rollen  A. ([id number]) -------------------------------------------------------------------------------- Wound Assessment Details Patient Name: Starace, Zidan A. Date of Service: 01/23/2017 9:15 AM Medical Record Number: [id number] Patient Account Number: [id number] Date of Birth/Sex: 17-Mar-1966 (51 y.o. Male) Treating RN: Curtis Sites Primary Care Meshia Rau: Angus Palms Other Clinician: Referring Brianah Hopson: Angus Palms Treating  Rajveer Handler/Extender: Rudene Re in Treatment: 4 Wound Status Wound Number: 10 Primary Diabetic Wound/Ulcer of the Lower Etiology: Extremity Wound Location: Left Amputation Site - Below Knee - Posterior Secondary Pressure Ulcer Etiology: Wounding Event: Pressure Injury Wound Open Date Acquired: 12/26/2014 Status: Weeks Of Treatment: 4 Comorbid Chronic sinus problems/congestion, Clustered Wound: No History: Anemia, Asthma, Sleep Apnea, Pending Amputation On Presentation Coronary Artery Disease, Hypertension, Myocardial Infarction, Type II Diabetes, History of pressure wounds, Gout, Neuropathy Photos Photo Uploaded By: Curtis Sites on 01/23/2017 10:54:39 Wound Measurements Length: (cm) 2.3 Width: (cm) 7.5 Depth: (cm) 0.5 Area: (cm) 13.548 Volume: (cm) 6.774 % Reduction in Area: 20.1% % Reduction in Volume: 0.2% Epithelialization: None Tunneling: No Undermining: No Wound Description Classification: Grade 1 Foul Odor Afte Wound Margin: Flat and Intact Slough/Fibrino Exudate Amount: Large Exudate Type: Serous Exudate Color: amber Avetisyan, Pope A. ([id number]) r Cleansing: No Yes Wound Bed Granulation Amount: Large (67-100%) Exposed Structure Granulation Quality: Pink Fascia Exposed: No Necrotic Amount: Small (1-33%) Fat Layer (Subcutaneous Tissue) Exposed: No Necrotic Quality: Adherent Slough Tendon Exposed: No Muscle Exposed: No Joint Exposed: No Bone Exposed: No Limited to Skin Breakdown Periwound Skin Texture Texture Color No Abnormalities Noted: No No Abnormalities Noted: No Callus: No Atrophie Blanche: No Crepitus: No Cyanosis: No Excoriation: No Ecchymosis: No Induration: No Erythema: No Rash: No Hemosiderin Staining: No Scarring: No Mottled: No Pallor: No Moisture Rubor: No No Abnormalities Noted: No Dry / Scaly: No Temperature / Pain Maceration: Yes Tenderness on Palpation: Yes Wound Preparation Ulcer Cleansing:  Rinsed/Irrigated with Saline Topical Anesthetic Applied: Other: lidocaine 4%, Electronic Signature(s) Signed: 01/23/2017 5:14:48 PM By: Curtis Sites Entered By: Curtis Sites on 01/23/2017 09:26:18 Ullom, Maycen AMarland Kitchen ([id number]) -------------------------------------------------------------------------------- Wound Assessment Details Patient Name: [name] Date of Service: 01/23/2017 9:15 AM Medical Record Number: [id number] Patient Account Number: [id number] Date of Birth/Sex: [birth year] (51 y.o. Male) Treating RN: Curtis Sites Primary Care Homer Pfeifer: Angus Palms Other Clinician: Referring Antoni Stefan: Angus Palms Treating Ayson Cherubini/Extender: Rudene Re in Treatment: 4 Wound Status Wound Number: 11 Primary Diabetic Wound/Ulcer of the Lower Etiology: Extremity Wound Location: Left Amputation Site - Below Knee - Anterior Secondary Pressure Ulcer Etiology: Wounding Event: Pressure Injury Wound Open Date Acquired: 12/15/2016 Status: Weeks Of Treatment: 4 Comorbid Chronic sinus problems/congestion, Clustered Wound: No History: Anemia, Asthma, Sleep Apnea, Pending Amputation On Presentation Coronary Artery Disease, Hypertension, Myocardial Infarction, Type II Diabetes, History of pressure wounds, Gout, Neuropathy Photos Photo Uploaded By: Curtis Sites on 01/23/2017 10:54:55 Wound Measurements Length: (cm) 0.1 Width: (cm) 0.1 Depth: (cm) 0.9 Area: (cm) 0.008 Volume: (cm) 0.007 % Reduction in Area: 74.2% % Reduction in Volume: 75% Epithelialization: None Tunneling: No Undermining: No Wound Description Classification: Grade 1 Foul Odor Afte Wound Margin: Flat and Intact Slough/Fibrino Exudate Amount: Large Exudate Type: Sanguinous Exudate Color: red Lienemann, Domingos A. ([id number]) r Cleansing: No No Wound Bed Granulation Amount: Large (67-100%) Exposed Structure Granulation Quality: Pink Fascia Exposed: No Necrotic Amount: None Present  (0%) Fat Layer (Subcutaneous Tissue) Exposed: No Tendon Exposed: No Muscle Exposed: No Joint Exposed: No Bone Exposed: No Limited to Skin Breakdown Periwound Skin Texture Texture Color No Abnormalities Noted: No No Abnormalities Noted: No Callus: No Atrophie Blanche: No Crepitus: No  Cyanosis: No Excoriation: No Ecchymosis: No Induration: No Erythema: No Rash: No Hemosiderin Staining: No Scarring: No Mottled: No Pallor: No Moisture Rubor: No No Abnormalities Noted: No Dry / Scaly: No Temperature / Pain Maceration: No Tenderness on Palpation: Yes Wound Preparation Ulcer Cleansing: Rinsed/Irrigated with Saline Topical Anesthetic Applied: Other: lidocaine 4%, Electronic Signature(s) Signed: 01/23/2017 5:14:48 PM By: Curtis Sitesorthy, Joanna Entered By: Curtis Sitesorthy, Joanna on 01/23/2017 09:26:29 Maurer, Calan AMarland Kitchen. ([id number]) -------------------------------------------------------------------------------- Vitals Details Patient Name: Claudell KylePHILLIPS, Juwon A. Date of Service: 01/23/2017 9:15 AM Medical Record Number: [id number] Patient Account Number: [id number] Date of Birth/Sex: 1966-11-06 91(50 y.o. Male) Treating RN: Curtis Sitesorthy, Joanna Primary Care Amos Micheals: Angus PalmsGEORGE, SIONNE Other Clinician: Referring Jadyn Brasher: Angus PalmsGEORGE, SIONNE Treating Deadrian Toya/Extender: Rudene ReBritto, Errol Weeks in Treatment: 4 Vital Signs Time Taken: 09:15 Temperature (F): 98.1 Height (in): 66 Pulse (bpm): 79 Weight (lbs): 282 Respiratory Rate (breaths/min): 18 Body Mass Index (BMI): 45.5 Blood Pressure (mmHg): 155/98 Reference Range: 80 - 120 mg / dl Electronic Signature(s) Signed: 01/23/2017 5:14:48 PM By: Curtis Sitesorthy, Joanna Entered By: Curtis Sitesorthy, Joanna on 01/23/2017 09:17:04

## 2017-02-02 ENCOUNTER — Inpatient Hospital Stay (HOSPITAL_COMMUNITY)
Admission: RE | Admit: 2017-02-02 | Discharge: 2017-02-02 | Disposition: A | Discharge status: Home / Self Care | Payer: Self-pay | Source: Ambulatory Visit

## 2017-02-02 NOTE — H&P (Signed)
[name] is an 51 y.o. male.   Chief Complaint:Vision loss right eye for 2 months  HPI: diabetic with vitreous hemorrhage right eye  Past Medical History:  Diagnosis Date  . Allergic rhinitis   . CAD (coronary artery disease)   . Congestive heart failure (HCC)   . Diabetes mellitus type II   . GERD (gastroesophageal reflux disease)   . History of nephrolithiasis   . Hyperlipidemia   . Hypertension   . Low back pain   . Myocardial infarction   . Sleep apnea   . Thyroid disease     Past Surgical History:  Procedure Laterality Date  . 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 CORONARY ANGIOPLASTY WITH STENT PLACEMENT    . stress cardiolite      No family history on file. Social History:  reports that he has never smoked. He has never used smokeless tobacco. He reports that he does not drink alcohol or use drugs.  Allergies:  Allergies  Allergen Reactions  . Amitriptyline Nausea Only  . Other Anaphylaxis    Tree nuts  . Pravastatin Other (See Comments)    Other Reaction: MUSCLE ACHES AND ELEVATED CK  . Ace Inhibitors Cough  . Iodinated Diagnostic Agents     Other reaction(s): Kidney Disorder  . Sulfamethoxazole-Trimethoprim Other (See Comments)    Mouth ulcers  . Tape Dermatitis    Band-Aid type (patient states he tolerates BAND-AIDS)    No prescriptions prior to admission.    Review of systems otherwise negative  There were no vitals taken for this visit.  Physical exam: Mental status: oriented x3. Eyes: See eye exam associated with this date of surgery in media tab.  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

## 2017-02-02 NOTE — Progress Notes (Signed)
Notified Dr. Ashley RoyaltyMatthews office of no orders in epic.

## 2017-02-03 DIAGNOSIS — L918 Other hypertrophic disorders of the skin: Secondary | ICD-10-CM | POA: Diagnosis not present

## 2017-02-04 ENCOUNTER — Encounter (INDEPENDENT_AMBULATORY_CARE_PROVIDER_SITE_OTHER): Payer: PPO | Admitting: Ophthalmology

## 2017-02-04 DIAGNOSIS — H2513 Age-related nuclear cataract, bilateral: Secondary | ICD-10-CM

## 2017-02-04 DIAGNOSIS — H43813 Vitreous degeneration, bilateral: Secondary | ICD-10-CM | POA: Diagnosis not present

## 2017-02-04 DIAGNOSIS — I10 Essential (primary) hypertension: Secondary | ICD-10-CM | POA: Diagnosis not present

## 2017-02-04 DIAGNOSIS — E10319 Type 1 diabetes mellitus with unspecified diabetic retinopathy without macular edema: Secondary | ICD-10-CM | POA: Diagnosis not present

## 2017-02-04 DIAGNOSIS — H4311 Vitreous hemorrhage, right eye: Secondary | ICD-10-CM | POA: Diagnosis not present

## 2017-02-04 DIAGNOSIS — E103593 Type 1 diabetes mellitus with proliferative diabetic retinopathy without macular edema, bilateral: Secondary | ICD-10-CM | POA: Diagnosis not present

## 2017-02-04 DIAGNOSIS — H35033 Hypertensive retinopathy, bilateral: Secondary | ICD-10-CM

## 2017-02-05 ENCOUNTER — Encounter (HOSPITAL_COMMUNITY): Payer: Self-pay

## 2017-02-05 ENCOUNTER — Encounter (HOSPITAL_COMMUNITY)
Admission: RE | Admit: 2017-02-05 | Discharge: 2017-02-05 | Disposition: A | Discharge status: Home / Self Care | Payer: PPO | Source: Ambulatory Visit | Attending: Ophthalmology | Admitting: Ophthalmology

## 2017-02-05 DIAGNOSIS — I509 Heart failure, unspecified: Secondary | ICD-10-CM | POA: Insufficient documentation

## 2017-02-05 DIAGNOSIS — K219 Gastro-esophageal reflux disease without esophagitis: Secondary | ICD-10-CM | POA: Diagnosis not present

## 2017-02-05 DIAGNOSIS — I130 Hypertensive heart and chronic kidney disease with heart failure and stage 1 through stage 4 chronic kidney disease, or unspecified chronic kidney disease: Secondary | ICD-10-CM | POA: Insufficient documentation

## 2017-02-05 DIAGNOSIS — N184 Chronic kidney disease, stage 4 (severe): Secondary | ICD-10-CM | POA: Diagnosis not present

## 2017-02-05 DIAGNOSIS — Z01818 Encounter for other preprocedural examination: Secondary | ICD-10-CM | POA: Diagnosis not present

## 2017-02-05 DIAGNOSIS — I255 Ischemic cardiomyopathy: Secondary | ICD-10-CM | POA: Diagnosis not present

## 2017-02-05 DIAGNOSIS — Z89612 Acquired absence of left leg above knee: Secondary | ICD-10-CM | POA: Insufficient documentation

## 2017-02-05 DIAGNOSIS — E1122 Type 2 diabetes mellitus with diabetic chronic kidney disease: Secondary | ICD-10-CM | POA: Diagnosis not present

## 2017-02-05 DIAGNOSIS — Z01812 Encounter for preprocedural laboratory examination: Secondary | ICD-10-CM | POA: Insufficient documentation

## 2017-02-05 DIAGNOSIS — Z794 Long term (current) use of insulin: Secondary | ICD-10-CM | POA: Insufficient documentation

## 2017-02-05 DIAGNOSIS — E039 Hypothyroidism, unspecified: Secondary | ICD-10-CM | POA: Diagnosis not present

## 2017-02-05 DIAGNOSIS — E785 Hyperlipidemia, unspecified: Secondary | ICD-10-CM | POA: Insufficient documentation

## 2017-02-05 DIAGNOSIS — Z79899 Other long term (current) drug therapy: Secondary | ICD-10-CM | POA: Diagnosis not present

## 2017-02-05 DIAGNOSIS — I2510 Atherosclerotic heart disease of native coronary artery without angina pectoris: Secondary | ICD-10-CM | POA: Insufficient documentation

## 2017-02-05 DIAGNOSIS — G4733 Obstructive sleep apnea (adult) (pediatric): Secondary | ICD-10-CM | POA: Diagnosis not present

## 2017-02-05 DIAGNOSIS — H4311 Vitreous hemorrhage, right eye: Secondary | ICD-10-CM | POA: Diagnosis not present

## 2017-02-05 DIAGNOSIS — Z7902 Long term (current) use of antithrombotics/antiplatelets: Secondary | ICD-10-CM | POA: Insufficient documentation

## 2017-02-05 HISTORY — DX: Other complications of anesthesia, initial encounter: T88.59XA

## 2017-02-05 HISTORY — DX: Chronic kidney disease, stage 4 (severe): N18.4

## 2017-02-05 HISTORY — DX: Gout, unspecified: M10.9

## 2017-02-05 HISTORY — DX: Adverse effect of unspecified anesthetic, initial encounter: T41.45XA

## 2017-02-05 HISTORY — DX: Polyneuropathy, unspecified: G62.9

## 2017-02-05 HISTORY — DX: Hypothyroidism, unspecified: E03.9

## 2017-02-05 HISTORY — DX: Anxiety disorder, unspecified: F41.9

## 2017-02-05 LAB — SURGICAL PCR SCREEN
MRSA, PCR: NEGATIVE
STAPHYLOCOCCUS AUREUS: NEGATIVE

## 2017-02-05 LAB — CBC
HCT: 38.4 % — ABNORMAL LOW (ref 39.0–52.0)
Hemoglobin: 12.2 g/dL — ABNORMAL LOW (ref 13.0–17.0)
MCH: 28 pg (ref 26.0–34.0)
MCHC: 31.8 g/dL (ref 30.0–36.0)
MCV: 88.3 fL (ref 78.0–100.0)
Platelets: 184 10*3/uL (ref 150–400)
RBC: 4.35 MIL/uL (ref 4.22–5.81)
RDW: 14.8 % (ref 11.5–15.5)
WBC: 8.5 10*3/uL (ref 4.0–10.5)

## 2017-02-05 LAB — BASIC METABOLIC PANEL
Anion gap: 14 (ref 5–15)
BUN: 90 mg/dL — AB (ref 6–20)
CALCIUM: 7.4 mg/dL — AB (ref 8.9–10.3)
CO2: 20 mmol/L — ABNORMAL LOW (ref 22–32)
CREATININE: 2.3 mg/dL — AB (ref 0.61–1.24)
Chloride: 104 mmol/L (ref 101–111)
GFR calc non Af Amer: 31 mL/min — ABNORMAL LOW (ref >60)
GFR, EST AFRICAN AMERICAN: 36 mL/min — AB (ref >60)
Glucose, Bld: 137 mg/dL — ABNORMAL HIGH (ref 65–99)
Potassium: 4.1 mmol/L (ref 3.5–5.1)
Sodium: 138 mmol/L (ref 135–145)

## 2017-02-05 LAB — GLUCOSE, CAPILLARY: Glucose-Capillary: 121 mg/dL — ABNORMAL HIGH (ref 65–99)

## 2017-02-05 NOTE — Progress Notes (Signed)
Requested anesthesia+ OR records from last 2 surgeries 2012 (left leg amput),D/C summary and  sleep study  From [facility].   Spoke with Marylene LandAngela with Anesthesia  Re: trouble waking up after surgery, HX trach . Marylene Landngela to review records and chart.

## 2017-02-06 ENCOUNTER — Encounter (HOSPITAL_COMMUNITY): Payer: Self-pay

## 2017-02-06 NOTE — Progress Notes (Addendum)
Anesthesia Chart Review:  Pt is a 51 year old male scheduled for R pars plana vitrectomy on 02/10/2017 with Tempie Hoist, MD.   - PCP is Salome Holmes, MD (notes in care everywhere) - Cardiologist is Cloretta Ned, MD, who has cleared pt for surgery; last office visit 07/07/16 (notes in care everywhere) - Endocrinologist is Vanetta Mulders, MD (notes in care everywhere)  PMH includes:  CAD, ischemic cardiomyopathy (EF 35% in 2013), CHF, HTN, DM, hyperlipidemia, OSA, CKD (stage IV), hypothyroidism, GERD. Never smoker. BMI 45. 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 S/p emergency trach 04/17/11 (now closed)  - From notes, it appears pt was evaluated for ICD in 2009, but ICD was never implanted due to chronic open LE wounds.  Notes in care everywhere indicate pt still has open wounds on L AKA stump.   Anesthesia history includes: Pt reported at PAT he had airway issues after his L BKA at [facility] and needed an emergency trach.  In actuality, on 04/17/11, pt was here in [place] at rehab facility when he experienced angioedema thought to be related to peanut allergy.  He underwent emergency trach here, anesthesia record in media tab in Epic 05/02/11 labeled "operative/procedure information".  Situation was complicated by OSA and morbid obesity.  Pt also suffered cardiac arrest when trach was dislodged, spent time in ICU.  See discharge summary 05/29/11 in notes tab in Epic for details.   For more recent anesthesia record, see care everywhere 03/15/13; L AKA done under regional and MAC   Medications include: Albuterol, carvedilol, Plavix, NovoLog, Toujeo, levothyroxin, metolazone, Zegerid, spironolactone, torsemide. Pt to hold ASA and plavix 5 days before surgery.   Preoperative labs reviewed.   - glucose 137. HbA1c was 6.7 on 01/07/17.  - Cr 2.3, BUN 90. Cardiologist's notes state baseline Cr is 1.8-2.2. Pt reports cardiologist follows his renal function and he does not see nephrology  CXR 07/07/16 (care  everywhere): Clear lungs. No pleural fluid or focal osseous abnormality.  EKG 02/05/17: NSR. RBBB. Anteroseptal infarct, age undetermined  TEE 05/21/12:  - No obvious evidence ofvalvular vegetations. - Moderate to severe TR ( RVSP at least 64 mmhg).  - Moderately reduced LV systolic function, LVEF 62%. - Moderately reduced RV systolic function. - Left pleural effusion.  Nuclear stress test 11/22/09 (care everywhere):  - Negative for ST segment changes - The post stress LVEF, by quantitative gated SPECT imaging, is 27%. Apical dyskinesis. Septal and anterior walls akinesis.Inferior and lateral severe hypokinesis. Dilated LV with ESV of 110 mL.  R cardiac cath 04/07/07 (care everywhere): C.O. 4.4, C.I. 2.03, PBVR 3.3., SVR 17.9, RA mean 28, RV 80/30, PA 80, PCWP 30, AVO2 diff. 6.52.    Cardiac cath 11/23/06 (care everywhere): Left Main:normal LAD system:total proximal. Balloon PTCA and BMS to LAD (100% -> 10%) and D2 (90% -> 50%) LCX system:10% OM2.  RCA system: nondominant with a total mid lesion. 60% PDA  If no changes, I anticipate pt can proceed with surgery as scheduled.   Willeen Cass, FNP-BC [facility]/Anesthesiology Phone: [phone] 02/06/2017 3:02 PM

## 2017-02-09 MED ORDER — DEXTROSE 5 % IV SOLN
3.0000 g | INTRAVENOUS | Status: AC
  Administered 2017-02-10: 3 g via INTRAVENOUS
  Filled 2017-02-09: qty 3000

## 2017-02-10 ENCOUNTER — Ambulatory Visit (HOSPITAL_COMMUNITY): Payer: PPO | Admitting: Emergency Medicine

## 2017-02-10 ENCOUNTER — Encounter (HOSPITAL_COMMUNITY)
Admission: RE | Disposition: A | Discharge status: Home / Self Care | Payer: Self-pay | Source: Ambulatory Visit | Attending: Ophthalmology

## 2017-02-10 ENCOUNTER — Encounter (HOSPITAL_COMMUNITY): Payer: Self-pay

## 2017-02-10 ENCOUNTER — Ambulatory Visit (HOSPITAL_COMMUNITY): Payer: PPO | Admitting: Certified Registered Nurse Anesthetist

## 2017-02-10 ENCOUNTER — Ambulatory Visit (HOSPITAL_COMMUNITY)
Admission: RE | Admit: 2017-02-10 | Discharge: 2017-02-11 | Disposition: A | Discharge status: Home / Self Care | Payer: PPO | Source: Ambulatory Visit | Attending: Ophthalmology | Admitting: Ophthalmology

## 2017-02-10 DIAGNOSIS — J309 Allergic rhinitis, unspecified: Secondary | ICD-10-CM | POA: Diagnosis not present

## 2017-02-10 DIAGNOSIS — G4733 Obstructive sleep apnea (adult) (pediatric): Secondary | ICD-10-CM | POA: Insufficient documentation

## 2017-02-10 DIAGNOSIS — I252 Old myocardial infarction: Secondary | ICD-10-CM | POA: Diagnosis not present

## 2017-02-10 DIAGNOSIS — N184 Chronic kidney disease, stage 4 (severe): Secondary | ICD-10-CM | POA: Diagnosis not present

## 2017-02-10 DIAGNOSIS — Z89512 Acquired absence of left leg below knee: Secondary | ICD-10-CM | POA: Diagnosis not present

## 2017-02-10 DIAGNOSIS — E1139 Type 2 diabetes mellitus with other diabetic ophthalmic complication: Secondary | ICD-10-CM | POA: Diagnosis present

## 2017-02-10 DIAGNOSIS — I130 Hypertensive heart and chronic kidney disease with heart failure and stage 1 through stage 4 chronic kidney disease, or unspecified chronic kidney disease: Secondary | ICD-10-CM | POA: Diagnosis not present

## 2017-02-10 DIAGNOSIS — Z7901 Long term (current) use of anticoagulants: Secondary | ICD-10-CM | POA: Diagnosis not present

## 2017-02-10 DIAGNOSIS — E113591 Type 2 diabetes mellitus with proliferative diabetic retinopathy without macular edema, right eye: Secondary | ICD-10-CM | POA: Diagnosis not present

## 2017-02-10 DIAGNOSIS — E10319 Type 1 diabetes mellitus with unspecified diabetic retinopathy without macular edema: Secondary | ICD-10-CM | POA: Diagnosis not present

## 2017-02-10 DIAGNOSIS — H3340 Traction detachment of retina, unspecified eye: Secondary | ICD-10-CM

## 2017-02-10 DIAGNOSIS — E039 Hypothyroidism, unspecified: Secondary | ICD-10-CM | POA: Insufficient documentation

## 2017-02-10 DIAGNOSIS — H4311 Vitreous hemorrhage, right eye: Secondary | ICD-10-CM | POA: Insufficient documentation

## 2017-02-10 DIAGNOSIS — H3341 Traction detachment of retina, right eye: Secondary | ICD-10-CM | POA: Insufficient documentation

## 2017-02-10 DIAGNOSIS — I509 Heart failure, unspecified: Secondary | ICD-10-CM | POA: Insufficient documentation

## 2017-02-10 DIAGNOSIS — Z79899 Other long term (current) drug therapy: Secondary | ICD-10-CM | POA: Diagnosis not present

## 2017-02-10 DIAGNOSIS — I2510 Atherosclerotic heart disease of native coronary artery without angina pectoris: Secondary | ICD-10-CM | POA: Diagnosis not present

## 2017-02-10 DIAGNOSIS — E103521 Type 1 diabetes mellitus with proliferative diabetic retinopathy with traction retinal detachment involving the macula, right eye: Secondary | ICD-10-CM | POA: Diagnosis not present

## 2017-02-10 DIAGNOSIS — K219 Gastro-esophageal reflux disease without esophagitis: Secondary | ICD-10-CM | POA: Diagnosis not present

## 2017-02-10 DIAGNOSIS — I255 Ischemic cardiomyopathy: Secondary | ICD-10-CM | POA: Insufficient documentation

## 2017-02-10 DIAGNOSIS — G4730 Sleep apnea, unspecified: Secondary | ICD-10-CM | POA: Diagnosis not present

## 2017-02-10 DIAGNOSIS — Z794 Long term (current) use of insulin: Secondary | ICD-10-CM | POA: Insufficient documentation

## 2017-02-10 DIAGNOSIS — Z955 Presence of coronary angioplasty implant and graft: Secondary | ICD-10-CM | POA: Insufficient documentation

## 2017-02-10 DIAGNOSIS — Z6841 Body Mass Index (BMI) 40.0 and over, adult: Secondary | ICD-10-CM | POA: Insufficient documentation

## 2017-02-10 DIAGNOSIS — E1122 Type 2 diabetes mellitus with diabetic chronic kidney disease: Secondary | ICD-10-CM | POA: Insufficient documentation

## 2017-02-10 DIAGNOSIS — M545 Low back pain: Secondary | ICD-10-CM | POA: Diagnosis not present

## 2017-02-10 HISTORY — PX: MEMBRANE PEEL: SHX5967

## 2017-02-10 HISTORY — PX: PARS PLANA VITRECTOMY 27 GAUGE WITH 25 GAUGE PORT: SHX6740

## 2017-02-10 HISTORY — PX: REPAIR OF COMPLEX TRACTION RETINAL DETACHMENT: SHX6217

## 2017-02-10 HISTORY — PX: LASER PHOTO ABLATION: SHX5942

## 2017-02-10 HISTORY — PX: GAS/FLUID EXCHANGE: SHX5334

## 2017-02-10 LAB — CBC
HCT: 40.7 % (ref 39.0–52.0)
Hemoglobin: 12.8 g/dL — ABNORMAL LOW (ref 13.0–17.0)
MCH: 27.8 pg (ref 26.0–34.0)
MCHC: 31.4 g/dL (ref 30.0–36.0)
MCV: 88.5 fL (ref 78.0–100.0)
Platelets: 185 10*3/uL (ref 150–400)
RBC: 4.6 MIL/uL (ref 4.22–5.81)
RDW: 14.6 % (ref 11.5–15.5)
WBC: 7.4 10*3/uL (ref 4.0–10.5)

## 2017-02-10 LAB — GLUCOSE, CAPILLARY
GLUCOSE-CAPILLARY: 139 mg/dL — AB (ref 65–99)
GLUCOSE-CAPILLARY: 101 mg/dL — AB (ref 65–99)
GLUCOSE-CAPILLARY: 118 mg/dL — AB (ref 65–99)
Glucose-Capillary: 99 mg/dL (ref 65–99)
Glucose-Capillary: 183 mg/dL — ABNORMAL HIGH (ref 65–99)
Glucose-Capillary: 354 mg/dL — ABNORMAL HIGH (ref 65–99)

## 2017-02-10 LAB — BASIC METABOLIC PANEL
Anion gap: 11 (ref 5–15)
BUN: 82 mg/dL — ABNORMAL HIGH (ref 6–20)
CALCIUM: 8.5 mg/dL — AB (ref 8.9–10.3)
CO2: 20 mmol/L — AB (ref 22–32)
CREATININE: 2 mg/dL — AB (ref 0.61–1.24)
Chloride: 109 mmol/L (ref 101–111)
GFR calc non Af Amer: 37 mL/min — ABNORMAL LOW (ref >60)
GFR, EST AFRICAN AMERICAN: 43 mL/min — AB (ref >60)
Glucose, Bld: 119 mg/dL — ABNORMAL HIGH (ref 65–99)
Potassium: 4.7 mmol/L (ref 3.5–5.1)
SODIUM: 140 mmol/L (ref 135–145)

## 2017-02-10 SURGERY — PARS PLANA VITRECTOMY 27 GAUGE WITH 25 GAUGE PORT
Anesthesia: General | Site: Eye | Laterality: Right

## 2017-02-10 MED ORDER — DEXAMETHASONE SODIUM PHOSPHATE 10 MG/ML IJ SOLN
INTRAMUSCULAR | Status: AC
  Filled 2017-02-10: qty 1

## 2017-02-10 MED ORDER — PROPOFOL 10 MG/ML IV BOLUS
INTRAVENOUS | Status: DC | PRN
  Administered 2017-02-10: 140 mg via INTRAVENOUS

## 2017-02-10 MED ORDER — HYDROCODONE-ACETAMINOPHEN 5-325 MG PO TABS
1.0000 | ORAL_TABLET | ORAL | Status: DC | PRN
  Administered 2017-02-11: 2 via ORAL
  Filled 2017-02-10: qty 2

## 2017-02-10 MED ORDER — ACETAZOLAMIDE SODIUM 500 MG IJ SOLR
500.0000 mg | Freq: Once | INTRAMUSCULAR | Status: AC
  Administered 2017-02-11: 500 mg via INTRAVENOUS
  Filled 2017-02-10: qty 500

## 2017-02-10 MED ORDER — BACITRACIN-POLYMYXIN B 500-10000 UNIT/GM OP OINT
1.0000 "application " | TOPICAL_OINTMENT | Freq: Three times a day (TID) | OPHTHALMIC | Status: DC
  Administered 2017-02-11: 1 via OPHTHALMIC
  Filled 2017-02-10 (×2): qty 3.5

## 2017-02-10 MED ORDER — BSS IO SOLN
INTRAOCULAR | Status: AC
  Filled 2017-02-10: qty 15

## 2017-02-10 MED ORDER — FLUTICASONE PROPIONATE 50 MCG/ACT NA SUSP: 1.0000 | Freq: Two times a day (BID) | NASAL | Status: DC | PRN

## 2017-02-10 MED ORDER — BSS PLUS IO SOLN
INTRAOCULAR | Status: DC | PRN
  Administered 2017-02-10: 1 via INTRAOCULAR

## 2017-02-10 MED ORDER — ZINC SULFATE 220 (50 ZN) MG PO CAPS
220.0000 mg | ORAL_CAPSULE | Freq: Every day | ORAL | Status: DC
  Administered 2017-02-10 – 2017-02-11 (×2): 220 mg via ORAL
  Filled 2017-02-10 (×2): qty 1

## 2017-02-10 MED ORDER — INSULIN ASPART 100 UNIT/ML ~~LOC~~ SOLN: 12.0000 [IU] | Freq: Four times a day (QID) | SUBCUTANEOUS | Status: DC | PRN

## 2017-02-10 MED ORDER — CLOPIDOGREL BISULFATE 75 MG PO TABS
75.0000 mg | ORAL_TABLET | Freq: Every day | ORAL | Status: DC
  Administered 2017-02-10 – 2017-02-11 (×2): 75 mg via ORAL
  Filled 2017-02-10 (×2): qty 1

## 2017-02-10 MED ORDER — EPHEDRINE 5 MG/ML INJ
INTRAVENOUS | Status: AC
  Filled 2017-02-10: qty 10

## 2017-02-10 MED ORDER — ATROPINE SULFATE 1 % OP SOLN
OPHTHALMIC | Status: AC
  Filled 2017-02-10: qty 5

## 2017-02-10 MED ORDER — OXYCODONE-ACETAMINOPHEN 5-325 MG PO TABS
1.0000 | ORAL_TABLET | Freq: Four times a day (QID) | ORAL | Status: DC | PRN
  Administered 2017-02-10: 2 via ORAL
  Filled 2017-02-10: qty 2

## 2017-02-10 MED ORDER — CLOBETASOL PROPIONATE 0.05 % EX OINT: 1.0000 "application " | TOPICAL_OINTMENT | Freq: Two times a day (BID) | CUTANEOUS | Status: DC | PRN

## 2017-02-10 MED ORDER — LATANOPROST 0.005 % OP SOLN
1.0000 [drp] | Freq: Every day | OPHTHALMIC | Status: DC
  Administered 2017-02-10: 1 [drp] via OPHTHALMIC
  Filled 2017-02-10: qty 2.5

## 2017-02-10 MED ORDER — SODIUM CHLORIDE 0.9 % IJ SOLN
INTRAMUSCULAR | Status: AC
  Filled 2017-02-10: qty 10

## 2017-02-10 MED ORDER — DEXTROSE 50 % IV SOLN
INTRAVENOUS | Status: AC
  Filled 2017-02-10: qty 50

## 2017-02-10 MED ORDER — LEVOTHYROXINE SODIUM 75 MCG PO TABS
75.0000 ug | ORAL_TABLET | Freq: Every day | ORAL | Status: DC
  Filled 2017-02-10: qty 1

## 2017-02-10 MED ORDER — 0.9 % SODIUM CHLORIDE (POUR BTL) OPTIME
TOPICAL | Status: DC | PRN
  Administered 2017-02-10: 1000 mL

## 2017-02-10 MED ORDER — BUPIVACAINE HCL (PF) 0.75 % IJ SOLN
INTRAMUSCULAR | Status: AC
  Filled 2017-02-10: qty 10

## 2017-02-10 MED ORDER — MORPHINE SULFATE (PF) 2 MG/ML IV SOLN: 1.0000 mg | INTRAVENOUS | Status: DC | PRN

## 2017-02-10 MED ORDER — FENTANYL CITRATE (PF) 100 MCG/2ML IJ SOLN
25.0000 ug | INTRAMUSCULAR | Status: DC | PRN
  Administered 2017-02-10 (×2): 50 ug via INTRAVENOUS

## 2017-02-10 MED ORDER — TROPICAMIDE 1 % OP SOLN
1.0000 [drp] | OPHTHALMIC | Status: AC
  Administered 2017-02-10 (×3): 1 [drp] via OPHTHALMIC
  Filled 2017-02-10: qty 15

## 2017-02-10 MED ORDER — EPINEPHRINE PF 1 MG/ML IJ SOLN
INTRAMUSCULAR | Status: DC | PRN
  Administered 2017-02-10: 1 mg

## 2017-02-10 MED ORDER — ACETAZOLAMIDE SODIUM 500 MG IJ SOLR
INTRAMUSCULAR | Status: AC
  Filled 2017-02-10: qty 500

## 2017-02-10 MED ORDER — STERILE WATER FOR INJECTION IJ SOLN
INTRAMUSCULAR | Status: DC | PRN
  Administered 2017-02-10: 20 mL

## 2017-02-10 MED ORDER — MAGNESIUM OXIDE 400 (241.3 MG) MG PO TABS
400.0000 mg | ORAL_TABLET | Freq: Every day | ORAL | Status: DC
  Administered 2017-02-11: 400 mg via ORAL
  Filled 2017-02-10: qty 1

## 2017-02-10 MED ORDER — SUGAMMADEX SODIUM 500 MG/5ML IV SOLN
INTRAVENOUS | Status: DC | PRN
  Administered 2017-02-10: 300 mg via INTRAVENOUS

## 2017-02-10 MED ORDER — ALBUTEROL SULFATE (2.5 MG/3ML) 0.083% IN NEBU: 2.5000 mg | INHALATION_SOLUTION | Freq: Four times a day (QID) | RESPIRATORY_TRACT | Status: DC | PRN

## 2017-02-10 MED ORDER — DEXTROSE 5 % IV SOLN
INTRAVENOUS | Status: DC | PRN
  Administered 2017-02-10: 25 ug/min via INTRAVENOUS

## 2017-02-10 MED ORDER — CARVEDILOL 6.25 MG PO TABS
6.2500 mg | ORAL_TABLET | Freq: Two times a day (BID) | ORAL | Status: DC
  Administered 2017-02-10 – 2017-02-11 (×2): 6.25 mg via ORAL
  Filled 2017-02-10 (×2): qty 1

## 2017-02-10 MED ORDER — LIDOCAINE HCL (CARDIAC) 20 MG/ML IV SOLN
INTRAVENOUS | Status: DC | PRN
  Administered 2017-02-10: 80 mg via INTRATRACHEAL

## 2017-02-10 MED ORDER — CYCLOPENTOLATE HCL 1 % OP SOLN
1.0000 [drp] | OPHTHALMIC | Status: AC
  Administered 2017-02-10 (×3): 1 [drp] via OPHTHALMIC
  Filled 2017-02-10: qty 2

## 2017-02-10 MED ORDER — SODIUM CHLORIDE 0.45 % IV SOLN: INTRAVENOUS | Status: DC

## 2017-02-10 MED ORDER — ZINC GLUCONATE 50 MG PO TABS: 50.0000 mg | ORAL_TABLET | Freq: Every day | ORAL | Status: DC

## 2017-02-10 MED ORDER — GATIFLOXACIN 0.5 % OP SOLN
1.0000 [drp] | Freq: Four times a day (QID) | OPHTHALMIC | Status: DC
  Administered 2017-02-11: 1 [drp] via OPHTHALMIC
  Filled 2017-02-10 (×2): qty 2.5

## 2017-02-10 MED ORDER — INSULIN GLARGINE 100 UNIT/ML ~~LOC~~ SOLN
40.0000 [IU] | Freq: Two times a day (BID) | SUBCUTANEOUS | Status: DC
  Filled 2017-02-10 (×2): qty 0.4

## 2017-02-10 MED ORDER — MIDAZOLAM HCL 5 MG/5ML IJ SOLN
INTRAMUSCULAR | Status: DC | PRN
  Administered 2017-02-10: 2 mg via INTRAVENOUS

## 2017-02-10 MED ORDER — TETRACAINE HCL 0.5 % OP SOLN
2.0000 [drp] | Freq: Once | OPHTHALMIC | Status: DC
  Filled 2017-02-10: qty 2

## 2017-02-10 MED ORDER — SODIUM CHLORIDE 0.9 % IV SOLN
INTRAVENOUS | Status: DC
  Administered 2017-02-10: 11:00:00 via INTRAVENOUS

## 2017-02-10 MED ORDER — PANTOPRAZOLE SODIUM 40 MG PO TBEC
40.0000 mg | DELAYED_RELEASE_TABLET | Freq: Every day | ORAL | Status: DC
  Administered 2017-02-11: 40 mg via ORAL
  Filled 2017-02-10: qty 1

## 2017-02-10 MED ORDER — BUPIVACAINE HCL (PF) 0.75 % IJ SOLN
INTRAMUSCULAR | Status: DC | PRN
  Administered 2017-02-10: 10 mL

## 2017-02-10 MED ORDER — SUCCINYLCHOLINE CHLORIDE 200 MG/10ML IV SOSY
PREFILLED_SYRINGE | INTRAVENOUS | Status: AC
  Filled 2017-02-10: qty 10

## 2017-02-10 MED ORDER — PROPOFOL 10 MG/ML IV BOLUS
INTRAVENOUS | Status: AC
  Filled 2017-02-10: qty 20

## 2017-02-10 MED ORDER — SUCCINYLCHOLINE CHLORIDE 20 MG/ML IJ SOLN
INTRAMUSCULAR | Status: DC | PRN
  Administered 2017-02-10: 80 mg via INTRAVENOUS

## 2017-02-10 MED ORDER — FENTANYL CITRATE (PF) 100 MCG/2ML IJ SOLN
INTRAMUSCULAR | Status: AC
  Administered 2017-02-10: 50 ug
  Filled 2017-02-10: qty 2

## 2017-02-10 MED ORDER — IBUPROFEN 800 MG PO TABS: 800.0000 mg | ORAL_TABLET | Freq: Three times a day (TID) | ORAL | Status: DC | PRN

## 2017-02-10 MED ORDER — BSS PLUS IO SOLN
INTRAOCULAR | Status: AC
  Filled 2017-02-10: qty 500

## 2017-02-10 MED ORDER — SPIRONOLACTONE 25 MG PO TABS
25.0000 mg | ORAL_TABLET | Freq: Every day | ORAL | Status: DC
  Administered 2017-02-10 – 2017-02-11 (×2): 25 mg via ORAL
  Filled 2017-02-10 (×2): qty 1

## 2017-02-10 MED ORDER — LATANOPROST 0.005 % OP SOLN
1.0000 [drp] | Freq: Every day | OPHTHALMIC | Status: DC
  Filled 2017-02-10 (×2): qty 2.5

## 2017-02-10 MED ORDER — FENTANYL CITRATE (PF) 100 MCG/2ML IJ SOLN
INTRAMUSCULAR | Status: AC
  Filled 2017-02-10: qty 2

## 2017-02-10 MED ORDER — LIDOCAINE HCL 2 % IJ SOLN
INTRAMUSCULAR | Status: AC
  Filled 2017-02-10: qty 20

## 2017-02-10 MED ORDER — MIDAZOLAM HCL 2 MG/2ML IJ SOLN
INTRAMUSCULAR | Status: AC
  Filled 2017-02-10: qty 2

## 2017-02-10 MED ORDER — VITAMIN C 500 MG PO TABS
500.0000 mg | ORAL_TABLET | Freq: Every day | ORAL | Status: DC
  Administered 2017-02-10 – 2017-02-11 (×2): 500 mg via ORAL
  Filled 2017-02-10 (×2): qty 1

## 2017-02-10 MED ORDER — HEMOSTATIC AGENTS (NO CHARGE) OPTIME
TOPICAL | Status: DC | PRN
  Administered 2017-02-10: 1 via TOPICAL

## 2017-02-10 MED ORDER — ACETAMINOPHEN 325 MG PO TABS: 325.0000 mg | ORAL_TABLET | ORAL | Status: DC | PRN

## 2017-02-10 MED ORDER — DEXAMETHASONE SODIUM PHOSPHATE 10 MG/ML IJ SOLN
INTRAMUSCULAR | Status: DC | PRN
  Administered 2017-02-10: 10 mg

## 2017-02-10 MED ORDER — SODIUM HYALURONATE 10 MG/ML IO SOLN
INTRAOCULAR | Status: DC | PRN
  Administered 2017-02-10: 0.85 mL via INTRAOCULAR

## 2017-02-10 MED ORDER — HYALURONIDASE HUMAN 150 UNIT/ML IJ SOLN
INTRAMUSCULAR | Status: AC
  Filled 2017-02-10: qty 1

## 2017-02-10 MED ORDER — OXYCODONE HCL 5 MG PO TABS
5.0000 mg | ORAL_TABLET | Freq: Once | ORAL | Status: AC | PRN
  Administered 2017-02-10: 5 mg via ORAL

## 2017-02-10 MED ORDER — METOCLOPRAMIDE HCL 10 MG PO TABS
10.0000 mg | ORAL_TABLET | Freq: Three times a day (TID) | ORAL | Status: DC
  Administered 2017-02-10 – 2017-02-11 (×3): 10 mg via ORAL
  Filled 2017-02-10 (×3): qty 1

## 2017-02-10 MED ORDER — INSULIN ASPART 100 UNIT/ML ~~LOC~~ SOLN
0.0000 [IU] | SUBCUTANEOUS | Status: DC
  Administered 2017-02-10: 3 [IU] via SUBCUTANEOUS
  Administered 2017-02-11: 5 [IU] via SUBCUTANEOUS
  Administered 2017-02-11: 8 [IU] via SUBCUTANEOUS
  Administered 2017-02-11: 5 [IU] via SUBCUTANEOUS

## 2017-02-10 MED ORDER — FENTANYL CITRATE (PF) 100 MCG/2ML IJ SOLN
INTRAMUSCULAR | Status: AC
  Filled 2017-02-10: qty 4

## 2017-02-10 MED ORDER — POLYMYXIN B SULFATE 500000 UNITS IJ SOLR
INTRAMUSCULAR | Status: AC
  Filled 2017-02-10: qty 500000

## 2017-02-10 MED ORDER — HYPROMELLOSE (GONIOSCOPIC) 2.5 % OP SOLN
OPHTHALMIC | Status: AC
  Filled 2017-02-10: qty 15

## 2017-02-10 MED ORDER — ALLOPURINOL 100 MG PO TABS
100.0000 mg | ORAL_TABLET | Freq: Every day | ORAL | Status: DC
  Administered 2017-02-10 – 2017-02-11 (×2): 100 mg via ORAL
  Filled 2017-02-10 (×2): qty 1

## 2017-02-10 MED ORDER — METOLAZONE 2.5 MG PO TABS
2.5000 mg | ORAL_TABLET | Freq: Every day | ORAL | Status: DC | PRN
  Filled 2017-02-10: qty 1

## 2017-02-10 MED ORDER — DOXYCYCLINE HYCLATE 100 MG PO TABS
100.0000 mg | ORAL_TABLET | Freq: Two times a day (BID) | ORAL | Status: DC
  Administered 2017-02-10 – 2017-02-11 (×2): 100 mg via ORAL
  Filled 2017-02-10 (×2): qty 1

## 2017-02-10 MED ORDER — BRIMONIDINE TARTRATE 0.2 % OP SOLN
1.0000 [drp] | Freq: Two times a day (BID) | OPHTHALMIC | Status: DC
  Filled 2017-02-10 (×2): qty 5

## 2017-02-10 MED ORDER — BSS IO SOLN
INTRAOCULAR | Status: DC | PRN
  Administered 2017-02-10: 15 mL via INTRAOCULAR

## 2017-02-10 MED ORDER — SODIUM HYALURONATE 10 MG/ML IO SOLN
INTRAOCULAR | Status: AC
  Filled 2017-02-10: qty 0.85

## 2017-02-10 MED ORDER — MAGNESIUM HYDROXIDE 400 MG/5ML PO SUSP: 15.0000 mL | Freq: Four times a day (QID) | ORAL | Status: DC | PRN

## 2017-02-10 MED ORDER — LORATADINE 10 MG PO TABS
10.0000 mg | ORAL_TABLET | Freq: Every day | ORAL | Status: DC
  Administered 2017-02-10 – 2017-02-11 (×2): 10 mg via ORAL
  Filled 2017-02-10 (×2): qty 1

## 2017-02-10 MED ORDER — TEMAZEPAM 15 MG PO CAPS: 15.0000 mg | ORAL_CAPSULE | Freq: Every evening | ORAL | Status: DC | PRN

## 2017-02-10 MED ORDER — DEXTROSE 50 % IV SOLN
INTRAVENOUS | Status: DC | PRN
  Administered 2017-02-10: .25 via INTRAVENOUS

## 2017-02-10 MED ORDER — BACITRACIN-POLYMYXIN B 500-10000 UNIT/GM OP OINT
TOPICAL_OINTMENT | OPHTHALMIC | Status: AC
  Filled 2017-02-10: qty 3.5

## 2017-02-10 MED ORDER — OXYCODONE HCL 5 MG PO TABS
ORAL_TABLET | ORAL | Status: AC
  Filled 2017-02-10: qty 1

## 2017-02-10 MED ORDER — PREDNISOLONE ACETATE 1 % OP SUSP
1.0000 [drp] | Freq: Four times a day (QID) | OPHTHALMIC | Status: DC
  Administered 2017-02-11: 1 [drp] via OPHTHALMIC
  Filled 2017-02-10 (×2): qty 5

## 2017-02-10 MED ORDER — MUPIROCIN 2 % EX OINT: 1.0000 "application " | TOPICAL_OINTMENT | Freq: Two times a day (BID) | CUTANEOUS | Status: DC | PRN

## 2017-02-10 MED ORDER — EPINEPHRINE PF 1 MG/ML IJ SOLN
INTRAMUSCULAR | Status: AC
  Filled 2017-02-10: qty 1

## 2017-02-10 MED ORDER — GABAPENTIN 300 MG PO CAPS
300.0000 mg | ORAL_CAPSULE | Freq: Two times a day (BID) | ORAL | Status: DC
  Administered 2017-02-10 – 2017-02-11 (×2): 300 mg via ORAL
  Filled 2017-02-10 (×2): qty 1

## 2017-02-10 MED ORDER — STERILE WATER FOR INJECTION IJ SOLN
INTRAMUSCULAR | Status: AC
  Filled 2017-02-10: qty 20

## 2017-02-10 MED ORDER — OXYCODONE HCL 5 MG/5ML PO SOLN: 5.0000 mg | Freq: Once | ORAL | Status: AC | PRN

## 2017-02-10 MED ORDER — PHENYLEPHRINE HCL 2.5 % OP SOLN
1.0000 [drp] | OPHTHALMIC | Status: AC
  Administered 2017-02-10 (×3): 1 [drp] via OPHTHALMIC
  Filled 2017-02-10: qty 2

## 2017-02-10 MED ORDER — CEFTAZIDIME 1 G IJ SOLR
INTRAMUSCULAR | Status: AC
  Filled 2017-02-10: qty 1

## 2017-02-10 MED ORDER — DORZOLAMIDE HCL 2 % OP SOLN: 1.0000 [drp] | Freq: Three times a day (TID) | OPHTHALMIC | Status: DC

## 2017-02-10 MED ORDER — ONDANSETRON HCL 4 MG/2ML IJ SOLN
INTRAMUSCULAR | Status: AC
  Filled 2017-02-10: qty 2

## 2017-02-10 MED ORDER — TORSEMIDE 20 MG PO TABS
20.0000 mg | ORAL_TABLET | Freq: Two times a day (BID) | ORAL | Status: DC
  Administered 2017-02-10 – 2017-02-11 (×2): 20 mg via ORAL
  Filled 2017-02-10 (×2): qty 1

## 2017-02-10 MED ORDER — ONDANSETRON HCL 4 MG/2ML IJ SOLN
INTRAMUSCULAR | Status: DC | PRN
  Administered 2017-02-10: 4 mg via INTRAVENOUS

## 2017-02-10 MED ORDER — ONDANSETRON HCL 4 MG/2ML IJ SOLN: 4.0000 mg | Freq: Four times a day (QID) | INTRAMUSCULAR | Status: DC | PRN

## 2017-02-10 MED ORDER — TRIAMCINOLONE ACETONIDE 40 MG/ML IJ SUSP
INTRAMUSCULAR | Status: AC
  Filled 2017-02-10: qty 5

## 2017-02-10 MED ORDER — NITROGLYCERIN 0.4 MG SL SUBL: 0.4000 mg | SUBLINGUAL_TABLET | SUBLINGUAL | Status: DC | PRN

## 2017-02-10 MED ORDER — ROCURONIUM BROMIDE 100 MG/10ML IV SOLN
INTRAVENOUS | Status: DC | PRN
  Administered 2017-02-10: 40 mg via INTRAVENOUS

## 2017-02-10 MED ORDER — SENNA 8.6 MG PO TABS: 1.0000 | ORAL_TABLET | Freq: Every day | ORAL | Status: DC | PRN

## 2017-02-10 MED ORDER — FENTANYL CITRATE (PF) 100 MCG/2ML IJ SOLN
INTRAMUSCULAR | Status: DC | PRN
  Administered 2017-02-10: 100 ug via INTRAVENOUS

## 2017-02-10 MED ORDER — GATIFLOXACIN 0.5 % OP SOLN
1.0000 [drp] | OPHTHALMIC | Status: AC
  Administered 2017-02-10 (×3): 1 [drp] via OPHTHALMIC
  Filled 2017-02-10: qty 2.5

## 2017-02-10 SURGICAL SUPPLY — 68 items
APPLICATOR DR MATTHEWS STRL (MISCELLANEOUS) IMPLANT
BLADE EYE CATARACT 19 1.4 BEAV (BLADE) IMPLANT
BLADE MVR KNIFE 19G (BLADE) IMPLANT
BLADE MVR KNIFE 20G (BLADE) IMPLANT
CANNULA ANTERIOR CHAMBER 27GA (MISCELLANEOUS) IMPLANT
CANNULA DUAL BORE 23G (CANNULA) IMPLANT
CANNULA TROCAR 25GA VLV (OPHTHALMIC) IMPLANT
CANNULA VLV SOFT TIP 25GA (OPHTHALMIC) IMPLANT
CANNULA VLV SOFT TIP 27GA (OPHTHALMIC) ×3 IMPLANT
CORDS BIPOLAR (ELECTRODE) IMPLANT
COTTONBALL LRG STERILE PKG (GAUZE/BANDAGES/DRESSINGS) ×9 IMPLANT
COVER MAYO STAND STRL (DRAPES) IMPLANT
DRAPE INCISE 51X51 W/FILM STRL (DRAPES) IMPLANT
DRAPE OPHTHALMIC 77X100 STRL (CUSTOM PROCEDURE TRAY) ×3 IMPLANT
FILTER BLUE MILLIPORE (MISCELLANEOUS) IMPLANT
FILTER STRAW FLUID ASPIR (MISCELLANEOUS) IMPLANT
FORCEPS ECKARDT ILM 25G SERR (OPHTHALMIC RELATED) IMPLANT
FORCEPS GRIESHABER ILM 25G A (INSTRUMENTS) IMPLANT
FORCEPS GRIESHABER ILM 27G (INSTRUMENTS) ×3 IMPLANT
GLOVE SS BIOGEL STRL SZ 6.5 (GLOVE) ×1 IMPLANT
GLOVE SS BIOGEL STRL SZ 7 (GLOVE) ×1 IMPLANT
GLOVE SUPERSENSE BIOGEL SZ 6.5 (GLOVE) ×2
GLOVE SUPERSENSE BIOGEL SZ 7 (GLOVE) ×2
GLOVE SURG 8.5 LATEX PF (GLOVE) ×6 IMPLANT
GOWN STRL REUS W/ TWL LRG LVL3 (GOWN DISPOSABLE) ×3 IMPLANT
GOWN STRL REUS W/TWL LRG LVL3 (GOWN DISPOSABLE) ×6
HANDLE PNEUMATIC FOR CONSTEL (OPHTHALMIC) IMPLANT
KIT BASIN OR (CUSTOM PROCEDURE TRAY) ×3 IMPLANT
KNIFE CRESCENT 2.5 55 ANG (BLADE) IMPLANT
MICROPICK 25G (MISCELLANEOUS)
NEEDLE 18GX1X1/2 (RX/OR ONLY) (NEEDLE) ×6 IMPLANT
NEEDLE 25GX 5/8IN NON SAFETY (NEEDLE) IMPLANT
NEEDLE FILTER BLUNT 18X 1/2SAF (NEEDLE) ×2
NEEDLE FILTER BLUNT 18X1 1/2 (NEEDLE) ×1 IMPLANT
NEEDLE HYPO 30X.5 LL (NEEDLE) IMPLANT
NS IRRIG 1000ML POUR BTL (IV SOLUTION) ×3 IMPLANT
PACK VITRECTOMY CUSTOM (CUSTOM PROCEDURE TRAY) ×3 IMPLANT
PAD ARMBOARD 7.5X6 YLW CONV (MISCELLANEOUS) ×6 IMPLANT
PAK VITRECTOMY PIK  27GA (OPHTHALMIC) ×2
PAK VITRECTOMY PIK 27GA (OPHTHALMIC) ×1 IMPLANT
PENCIL BIPOLAR 25GA STR DISP (OPHTHALMIC RELATED) IMPLANT
PIC ILLUMINATED 25G (OPHTHALMIC) ×3
PICK MICROPICK 25G (MISCELLANEOUS) IMPLANT
PIK ILLUMINATED 25G (OPHTHALMIC) ×1 IMPLANT
PROBE LASER ILLUM FLEX 27GA (OPHTHALMIC) ×3 IMPLANT
PROBE LASER ILLUM FLEX CVD 25G (OPHTHALMIC) IMPLANT
REPL STRA BRUSH NEEDLE (NEEDLE) IMPLANT
RESERVOIR BACK FLUSH (MISCELLANEOUS) IMPLANT
ROLLS DENTAL (MISCELLANEOUS) ×6 IMPLANT
SCISSORS TIP ADVANCED DSP 25GA (INSTRUMENTS) IMPLANT
SCRAPER DIAMOND 25GA (OPHTHALMIC RELATED) IMPLANT
SCRAPER DIAMOND DUST MEMBRANE (MISCELLANEOUS) IMPLANT
SPONGE SURGIFOAM ABS GEL 12-7 (HEMOSTASIS) ×3 IMPLANT
STOPCOCK 4 WAY LG BORE MALE ST (IV SETS) IMPLANT
SUT CHROMIC 7 0 TG140 8 (SUTURE) IMPLANT
SUT ETHILON 10 0 CS140 6 (SUTURE) IMPLANT
SUT ETHILON 9 0 TG140 8 (SUTURE) IMPLANT
SUT POLY NON ABSORB 10-0 8 STR (SUTURE) IMPLANT
SUT SILK 4 0 RB 1 (SUTURE) IMPLANT
SYR 10ML LL (SYRINGE) IMPLANT
SYR 20CC LL (SYRINGE) ×3 IMPLANT
SYR 5ML LL (SYRINGE) IMPLANT
SYR BULB 3OZ (MISCELLANEOUS) ×3 IMPLANT
SYR TB 1ML LUER SLIP (SYRINGE) ×3 IMPLANT
TOWEL OR 17X24 6PK STRL BLUE (TOWEL DISPOSABLE) ×3 IMPLANT
TUBING HIGH PRESS EXTEN 6IN (TUBING) IMPLANT
WATER STERILE IRR 1000ML POUR (IV SOLUTION) ×3 IMPLANT
WIPE INSTRUMENT VISIWIPE 73X73 (MISCELLANEOUS) IMPLANT

## 2017-02-10 NOTE — Anesthesia Preprocedure Evaluation (Addendum)
Anesthesia Evaluation  Patient identified by MRN, date of birth, ID band Patient awake    Reviewed: Allergy & Precautions, NPO status , Patient's Chart, lab work & pertinent test results  History of Anesthesia Complications Negative for: history of anesthetic complications  Airway Mallampati: III  TM Distance: >3 FB Neck ROM: Full    Dental  (+) Teeth Intact   Pulmonary sleep apnea ,    breath sounds clear to auscultation       Cardiovascular hypertension, + CAD, + Past MI and +CHF   Rhythm:Regular     Neuro/Psych Anxiety negative neurological ROS     GI/Hepatic Neg liver ROS, GERD  ,  Endo/Other  diabetes, Insulin DependentHypothyroidism Morbid obesity  Renal/GU Renal InsufficiencyRenal disease     Musculoskeletal negative musculoskeletal ROS (+)   Abdominal   Peds  Hematology negative hematology ROS (+)   Anesthesia Other Findings Pt is a 51 year old male scheduled for R pars plana vitrectomy on 02/10/2017 with Tempie Hoist, MD.   - PCP is Salome Holmes, MD (notes in care everywhere) - Cardiologist is Cloretta Ned, MD, who has cleared pt for surgery; last office visit 07/07/16 (notes in care everywhere) - Endocrinologist is Vanetta Mulders, MD (notes in care everywhere)  PMH includes:  CAD, ischemic cardiomyopathy (EF 35% in 2013), CHF, HTN, DM, hyperlipidemia, OSA, CKD (stage IV), hypothyroidism, GERD. Never smoker. BMI 45. 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 S/p emergency trach 04/17/11 (now closed)  - From notes, it appears pt was evaluated for ICD in 2009, but ICD was never implanted due to chronic open LE wounds.  Notes in care everywhere indicate pt still has open wounds on L AKA stump.   Anesthesia history includes: Pt reported at PAT he had airway issues after his L BKA at [facility] and needed an emergency trach.  In actuality, on 04/17/11, pt was here in [place] at rehab facility when he  experienced angioedema thought to be related to peanut allergy.  He underwent emergency trach here, anesthesia record in media tab in Epic 05/02/11 labeled "operative/procedure information".  Situation was complicated by OSA and morbid obesity.  Pt also suffered cardiac arrest when trach was dislodged, spent time in ICU.  See discharge summary 05/29/11 in notes tab in Epic for details.   For more recent anesthesia record, see care everywhere 03/15/13; L AKA done under regional and [facility]   Medications include: Albuterol, carvedilol, Plavix, NovoLog, Toujeo, levothyroxin, metolazone, Zegerid, spironolactone, torsemide. Pt to hold ASA and plavix 5 days before surgery.   Preoperative labs reviewed.   - glucose 137. HbA1c was 6.7 on 01/07/17.  - Cr 2.3, BUN 90. Cardiologist's notes state baseline Cr is 1.8-2.2. Pt reports cardiologist follows his renal function and he does not see nephrology  CXR 07/07/16 (care everywhere): Clear lungs. No pleural fluid or focal osseous abnormality.  EKG 02/05/17: NSR. RBBB. Anteroseptal infarct, age undetermined  TEE 05/21/12:  - No obvious evidence ofvalvular vegetations. - Moderate to severe TR ( RVSP at least 64 mmhg).  - Moderately reduced LV systolic function, LVEF 86%. - Moderately reduced RV systolic function. - Left pleural effusion.  Nuclear stress test 11/22/09 (care everywhere):  - Negative for ST segment changes - The post stress LVEF, by quantitative gated SPECT imaging, is 27%. Apical dyskinesis. Septal and anterior walls akinesis.Inferior and lateral severe hypokinesis. Dilated LV with ESV of 110 mL.  R cardiac cath 04/07/07 (care everywhere): C.O. 4.4, C.I. 2.03, PBVR 3.3., SVR  17.9, RA mean 28, RV 80/30, PA 80, PCWP 30, AVO2 diff. 6.52.    Cardiac cath 11/23/06 (care everywhere): Left Main:normal LAD system:total proximal. Balloon PTCA and BMS to LAD (100% -> 10%) and D2 (90% -> 50%) LCX system:10% OM2.  RCA system:  nondominant with a total mid lesion. 60% PDA  Reproductive/Obstetrics negative OB ROS                           Anesthesia Physical Anesthesia Plan  ASA: IV  Anesthesia Plan: General   Post-op Pain Management:    Induction: Intravenous  Airway Management Planned: Oral ETT and Video Laryngoscope Planned  Additional Equipment: None  Intra-op Plan:   Post-operative Plan: Extubation in OR  Informed Consent: I have reviewed the patients History and Physical, chart, labs and discussed the procedure including the risks, benefits and alternatives for the proposed anesthesia with the patient or authorized representative who has indicated his/her understanding and acceptance.   Dental advisory given  Plan Discussed with: CRNA and Surgeon  Anesthesia Plan Comments:       Anesthesia Quick Evaluation

## 2017-02-10 NOTE — Anesthesia Procedure Notes (Signed)
Procedure Name: Intubation Date/Time: 02/10/2017 12:12 PM Performed by: Marena ChancyBECKNER, Deadra Diggins S Pre-anesthesia Checklist: Patient identified, Emergency Drugs available, Suction available and Patient being monitored Patient Re-evaluated:Patient Re-evaluated prior to inductionOxygen Delivery Method: Circle System Utilized Preoxygenation: Pre-oxygenation with 100% oxygen Intubation Type: IV induction Ventilation: Mask ventilation without difficulty Laryngoscope Size: Glidescope and 4 Grade View: Grade II Tube type: Oral Tube size: 7.5 mm Number of attempts: 1 Airway Equipment and Method: Stylet and Oral airway Placement Confirmation: ETT inserted through vocal cords under direct vision,  positive ETCO2 and breath sounds checked- equal and bilateral Tube secured with: Tape Dental Injury: Teeth and Oropharynx as per pre-operative assessment

## 2017-02-10 NOTE — H&P (Signed)
I examined the patient today and there is no change in the medical status 

## 2017-02-10 NOTE — Transfer of Care (Signed)
Immediate Anesthesia Transfer of Care Note  Patient: [name]  Procedure(s) Performed: Procedure(s): PARS PLANA VITRECTOMY 27 GAUGE WITH 27 GAUGE PORT (Right) LASER PHOTO ABLATION (Right) MEMBRANE PEEL (Right) GAS/FLUID EXCHANGE (Right) REPAIR OF COMPLEX TRACTION RETINAL DETACHMENT (Right)  Patient Location: PACU  Anesthesia Type:General  Level of Consciousness: awake, alert  and oriented  Airway & Oxygen Therapy: Patient Spontanous Breathing  Post-op Assessment: Report given to RN, Post -op Vital signs reviewed and stable and Patient moving all extremities X 4  Post vital signs: Reviewed and stable  Last Vitals:  Vitals:   02/10/17 1306 02/10/17 1309  BP:  (!) 149/56  Pulse:  70  Resp:  16  Temp: 36.7 C     Last Pain:  Vitals:   02/10/17 0943  PainSc: 10-Worst pain ever      Patients Stated Pain Goal: 3 (02/10/17 0943)  Complications: No apparent anesthesia complications

## 2017-02-10 NOTE — Brief Op Note (Signed)
Brief Operative note   Preoperative diagnosis: Traction retinal detachment and vitreous hemorrhage right eye Postoperative diagnosis  Same  Procedures: Repair of traction retinal detachment right eye with pars plana vitrectomy, membrane peel, laser, gas injection.  Surgeon:  Sherrie GeorgeJohn D Trinty Marken, MD...  Assistant:  Rosalie DoctorLisa Johnson SA    Anesthesia: General  Specimen: none  Estimated blood loss:  1cc  Complications: none  Patient sent to PACU in good condition  Composed by Sherrie GeorgeJohn D Celester Lech MD  Dictation number: [id number]

## 2017-02-10 NOTE — Progress Notes (Signed)
Patient arrived to the floor via stretcher. VSS. Eye shield clean, dry and intact. Patient is complaining of eye pain. RN will release orders and continue to monitor patients.

## 2017-02-11 ENCOUNTER — Encounter (HOSPITAL_COMMUNITY): Payer: Self-pay | Admitting: Ophthalmology

## 2017-02-11 DIAGNOSIS — H3341 Traction detachment of retina, right eye: Secondary | ICD-10-CM | POA: Diagnosis not present

## 2017-02-11 LAB — GLUCOSE, CAPILLARY
GLUCOSE-CAPILLARY: 241 mg/dL — AB (ref 65–99)
GLUCOSE-CAPILLARY: 286 mg/dL — AB (ref 65–99)
Glucose-Capillary: 249 mg/dL — ABNORMAL HIGH (ref 65–99)

## 2017-02-11 MED ORDER — GATIFLOXACIN 0.5 % OP SOLN: 1.0000 [drp] | Freq: Four times a day (QID) | OPHTHALMIC | Status: AC

## 2017-02-11 MED ORDER — PREDNISOLONE ACETATE 1 % OP SUSP: 1.0000 [drp] | Freq: Four times a day (QID) | OPHTHALMIC | 0 refills | Status: AC

## 2017-02-11 MED ORDER — BACITRACIN-POLYMYXIN B 500-10000 UNIT/GM OP OINT: 1.0000 "application " | TOPICAL_OINTMENT | Freq: Three times a day (TID) | OPHTHALMIC | 0 refills | Status: AC

## 2017-02-11 NOTE — Op Note (Signed)
NAME:  [name], [name]                  ACCOUNT NO.:  MEDICAL RECORD NO.:  [id number]  LOCATION:                                 FACILITY:  PHYSICIAN:  John D. Ashley RoyaltyMatthews, M.D. DATE OF BIRTH:  [birth year]  DATE OF PROCEDURE:  02/10/2017 DATE OF DISCHARGE:                              OPERATIVE REPORT   ADMISSION DIAGNOSIS:  Traction retinal detachment, vitreous hemorrhage, proliferative diabetic retinopathy, right eye.  PROCEDURES:  Repair of complex traction retinal detachment with pars plana vitrectomy, membrane peel, gas-fluid exchange, endolaser, all in the right eye.  SURGEON:  Beulah GandyJohn D. Ashley RoyaltyMatthews, M.D.  ASSISTANT:  Rosalie DoctorLisa Johnson, SA.  ANESTHESIA:  General.  DETAILS:  Usual prep and drape, 27-gauge trocars were placed at 8 o'clock, 10 o'clock, and 2 o'clock respectively.  The infusion at 8 o'clock Provisc placed on the corneal surface and the BIOM viewing system was moved into place.  Pars plana vitrectomy was carried out just behind the cataractous lens.  Large dark red pockets and light red pockets were encountered as well as old white blood.  The vitrectomy was carried in a core fashion down to the macular surface where previous laser marks were seen.  There was traction retinal detachment in the upper temporal quadrant.  The vitrectomy was carried into the mid periphery with rotation of the BIOM viewing system in the eye and into the far periphery.  The super wide viewing lens was applied to the BIOM and additional vitrectomy was carried out over the traction detachment and in the area of the vitreous base for 360 degrees.  Scleral depression was used to gain access to these regions.  Once all vitreous traction was removed, the endolaser was positioned in the eye, 1050 total burns were placed around the retinal periphery.  A gas-fluid exchange was carried out at the same time and laser was performed.  The power was 360 mW, 1000 microns each, and 0.1 seconds.  The  retina reattached and a total gas-fluid exchange was performed.  The instruments were removed from the eye.  The 25-gauge trocars were removed from the eye.  The wounds were tested and found to be secure. Closing pressure was 10 with Barraquer tonometer.  Atropine solution was applied.  Polymyxin and ceftazidime were rinsed around the globe for antibiotic coverage.  Decadron 10 mg was injected into the lower subconjunctival space.  Marcaine was injected around the globe for postop pain.  Polysporin ophthalmic ointment, a patch and shield were placed.  The patient was awakened and taken to recovery in satisfactory condition.    Beulah GandyJohn D. Ashley RoyaltyMatthews, M.D.    JDM/MEDQ  D:  02/10/2017  T:  02/10/2017  Job:  [id number]

## 2017-02-11 NOTE — Discharge Summary (Signed)
Discharge summary not needed on [facility] patients per medical records. 

## 2017-02-11 NOTE — Progress Notes (Signed)
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  Discharge to home.  Give post operative instructions  Sherrie GeorgeMATTHEWS, JOHN D 02/11/2017, 6:33 AM

## 2017-02-11 NOTE — Progress Notes (Signed)
Discharge instructions gone over with patient and spouse. Home medications reviewed. Eye bag was given and eye drops and ointment are included.  Precautions about eye bubble gone over. Diet, activity and reasons to call the doctor discussed. Follow up appointment to be made. Patient verbalized understanding of instructions.

## 2017-02-13 ENCOUNTER — Encounter (INDEPENDENT_AMBULATORY_CARE_PROVIDER_SITE_OTHER): Payer: PPO | Admitting: Ophthalmology

## 2017-02-13 DIAGNOSIS — E103511 Type 1 diabetes mellitus with proliferative diabetic retinopathy with macular edema, right eye: Secondary | ICD-10-CM

## 2017-02-13 DIAGNOSIS — E10311 Type 1 diabetes mellitus with unspecified diabetic retinopathy with macular edema: Secondary | ICD-10-CM

## 2017-02-13 NOTE — Anesthesia Postprocedure Evaluation (Addendum)
Anesthesia Post Note  Patient: [name]  Procedure(s) Performed: Procedure(s) (LRB): PARS PLANA VITRECTOMY 27 GAUGE WITH 27 GAUGE PORT (Right) LASER PHOTO ABLATION (Right) MEMBRANE PEEL (Right) GAS/FLUID EXCHANGE (Right) REPAIR OF COMPLEX TRACTION RETINAL DETACHMENT (Right)  Patient location during evaluation: PACU Anesthesia Type: General Level of consciousness: awake and alert Pain management: pain level controlled Vital Signs Assessment: post-procedure vital signs reviewed and stable Respiratory status: spontaneous breathing, nonlabored ventilation, respiratory function stable and patient connected to nasal cannula oxygen Cardiovascular status: blood pressure returned to baseline and stable Postop Assessment: no signs of nausea or vomiting Anesthetic complications: no       Last Vitals:  Vitals:   02/10/17 2000 02/11/17 0551  BP: 129/88 140/78  Pulse: 80 77  Resp: 18 18  Temp: 36.9 C 36.4 C    Last Pain:  Vitals:   02/11/17 0840  TempSrc:   PainSc: 2                  Mykhia Danish

## 2017-02-16 DIAGNOSIS — L249 Irritant contact dermatitis, unspecified cause: Secondary | ICD-10-CM | POA: Diagnosis not present

## 2017-02-16 DIAGNOSIS — L918 Other hypertrophic disorders of the skin: Secondary | ICD-10-CM | POA: Diagnosis not present

## 2017-02-20 ENCOUNTER — Ambulatory Visit: Payer: Self-pay | Admitting: Surgery

## 2017-02-24 DIAGNOSIS — Z5189 Encounter for other specified aftercare: Secondary | ICD-10-CM | POA: Diagnosis not present

## 2017-02-24 DIAGNOSIS — E1165 Type 2 diabetes mellitus with hyperglycemia: Secondary | ICD-10-CM | POA: Diagnosis not present

## 2017-02-24 DIAGNOSIS — Z794 Long term (current) use of insulin: Secondary | ICD-10-CM | POA: Diagnosis not present

## 2017-02-24 DIAGNOSIS — E119 Type 2 diabetes mellitus without complications: Secondary | ICD-10-CM | POA: Diagnosis not present

## 2017-02-24 DIAGNOSIS — Z89512 Acquired absence of left leg below knee: Secondary | ICD-10-CM | POA: Diagnosis not present

## 2017-02-24 DIAGNOSIS — Z6841 Body Mass Index (BMI) 40.0 and over, adult: Secondary | ICD-10-CM | POA: Diagnosis not present

## 2017-02-24 DIAGNOSIS — E039 Hypothyroidism, unspecified: Secondary | ICD-10-CM | POA: Diagnosis not present

## 2017-02-26 DIAGNOSIS — Z6841 Body Mass Index (BMI) 40.0 and over, adult: Secondary | ICD-10-CM | POA: Diagnosis not present

## 2017-02-26 DIAGNOSIS — E1165 Type 2 diabetes mellitus with hyperglycemia: Secondary | ICD-10-CM | POA: Diagnosis not present

## 2017-03-06 ENCOUNTER — Encounter (INDEPENDENT_AMBULATORY_CARE_PROVIDER_SITE_OTHER): Payer: PPO | Admitting: Ophthalmology

## 2017-03-06 DIAGNOSIS — E103591 Type 1 diabetes mellitus with proliferative diabetic retinopathy without macular edema, right eye: Secondary | ICD-10-CM

## 2017-03-06 DIAGNOSIS — E10319 Type 1 diabetes mellitus with unspecified diabetic retinopathy without macular edema: Secondary | ICD-10-CM

## 2017-04-08 DIAGNOSIS — I25118 Atherosclerotic heart disease of native coronary artery with other forms of angina pectoris: Secondary | ICD-10-CM | POA: Diagnosis not present

## 2017-04-08 DIAGNOSIS — L97509 Non-pressure chronic ulcer of other part of unspecified foot with unspecified severity: Secondary | ICD-10-CM | POA: Diagnosis not present

## 2017-04-08 DIAGNOSIS — E039 Hypothyroidism, unspecified: Secondary | ICD-10-CM | POA: Diagnosis not present

## 2017-04-08 DIAGNOSIS — M79661 Pain in right lower leg: Secondary | ICD-10-CM | POA: Diagnosis not present

## 2017-04-08 DIAGNOSIS — E11621 Type 2 diabetes mellitus with foot ulcer: Secondary | ICD-10-CM | POA: Diagnosis not present

## 2017-04-08 DIAGNOSIS — E1151 Type 2 diabetes mellitus with diabetic peripheral angiopathy without gangrene: Secondary | ICD-10-CM | POA: Diagnosis not present

## 2017-04-08 DIAGNOSIS — I5023 Acute on chronic systolic (congestive) heart failure: Secondary | ICD-10-CM | POA: Diagnosis not present

## 2017-04-08 DIAGNOSIS — E782 Mixed hyperlipidemia: Secondary | ICD-10-CM | POA: Diagnosis not present

## 2017-04-08 DIAGNOSIS — I255 Ischemic cardiomyopathy: Secondary | ICD-10-CM | POA: Diagnosis not present

## 2017-04-08 DIAGNOSIS — Z8614 Personal history of Methicillin resistant Staphylococcus aureus infection: Secondary | ICD-10-CM | POA: Diagnosis not present

## 2017-04-08 DIAGNOSIS — Z7982 Long term (current) use of aspirin: Secondary | ICD-10-CM | POA: Diagnosis not present

## 2017-04-08 DIAGNOSIS — J45909 Unspecified asthma, uncomplicated: Secondary | ICD-10-CM | POA: Diagnosis not present

## 2017-04-08 DIAGNOSIS — E669 Obesity, unspecified: Secondary | ICD-10-CM | POA: Diagnosis not present

## 2017-04-08 DIAGNOSIS — I252 Old myocardial infarction: Secondary | ICD-10-CM | POA: Diagnosis not present

## 2017-04-08 DIAGNOSIS — Z794 Long term (current) use of insulin: Secondary | ICD-10-CM | POA: Diagnosis not present

## 2017-04-08 DIAGNOSIS — E1142 Type 2 diabetes mellitus with diabetic polyneuropathy: Secondary | ICD-10-CM | POA: Diagnosis not present

## 2017-04-08 DIAGNOSIS — R0600 Dyspnea, unspecified: Secondary | ICD-10-CM | POA: Diagnosis not present

## 2017-04-08 DIAGNOSIS — E11610 Type 2 diabetes mellitus with diabetic neuropathic arthropathy: Secondary | ICD-10-CM | POA: Diagnosis not present

## 2017-04-08 DIAGNOSIS — G4733 Obstructive sleep apnea (adult) (pediatric): Secondary | ICD-10-CM | POA: Diagnosis not present

## 2017-04-08 DIAGNOSIS — Z7902 Long term (current) use of antithrombotics/antiplatelets: Secondary | ICD-10-CM | POA: Diagnosis not present

## 2017-04-08 DIAGNOSIS — M109 Gout, unspecified: Secondary | ICD-10-CM | POA: Diagnosis not present

## 2017-04-08 DIAGNOSIS — N183 Chronic kidney disease, stage 3 (moderate): Secondary | ICD-10-CM | POA: Diagnosis not present

## 2017-04-08 DIAGNOSIS — Z6841 Body Mass Index (BMI) 40.0 and over, adult: Secondary | ICD-10-CM | POA: Diagnosis not present

## 2017-04-08 DIAGNOSIS — I130 Hypertensive heart and chronic kidney disease with heart failure and stage 1 through stage 4 chronic kidney disease, or unspecified chronic kidney disease: Secondary | ICD-10-CM | POA: Diagnosis not present

## 2017-04-08 DIAGNOSIS — E113592 Type 2 diabetes mellitus with proliferative diabetic retinopathy without macular edema, left eye: Secondary | ICD-10-CM | POA: Diagnosis not present

## 2017-04-08 DIAGNOSIS — E1122 Type 2 diabetes mellitus with diabetic chronic kidney disease: Secondary | ICD-10-CM | POA: Diagnosis not present

## 2017-04-08 DIAGNOSIS — Z7952 Long term (current) use of systemic steroids: Secondary | ICD-10-CM | POA: Diagnosis not present

## 2017-04-10 DIAGNOSIS — Y33XXXA Other specified events, undetermined intent, initial encounter: Secondary | ICD-10-CM | POA: Diagnosis not present

## 2017-04-10 DIAGNOSIS — I5023 Acute on chronic systolic (congestive) heart failure: Secondary | ICD-10-CM | POA: Diagnosis not present

## 2017-04-10 DIAGNOSIS — M25571 Pain in right ankle and joints of right foot: Secondary | ICD-10-CM | POA: Diagnosis not present

## 2017-04-10 DIAGNOSIS — R0609 Other forms of dyspnea: Secondary | ICD-10-CM | POA: Diagnosis not present

## 2017-04-10 DIAGNOSIS — E11610 Type 2 diabetes mellitus with diabetic neuropathic arthropathy: Secondary | ICD-10-CM | POA: Diagnosis not present

## 2017-04-10 DIAGNOSIS — N184 Chronic kidney disease, stage 4 (severe): Secondary | ICD-10-CM | POA: Diagnosis not present

## 2017-04-10 DIAGNOSIS — E1151 Type 2 diabetes mellitus with diabetic peripheral angiopathy without gangrene: Secondary | ICD-10-CM | POA: Diagnosis not present

## 2017-04-10 DIAGNOSIS — Z8614 Personal history of Methicillin resistant Staphylococcus aureus infection: Secondary | ICD-10-CM | POA: Diagnosis not present

## 2017-04-10 DIAGNOSIS — E039 Hypothyroidism, unspecified: Secondary | ICD-10-CM | POA: Diagnosis not present

## 2017-04-10 DIAGNOSIS — I517 Cardiomegaly: Secondary | ICD-10-CM | POA: Diagnosis not present

## 2017-04-10 DIAGNOSIS — E113592 Type 2 diabetes mellitus with proliferative diabetic retinopathy without macular edema, left eye: Secondary | ICD-10-CM | POA: Diagnosis not present

## 2017-04-10 DIAGNOSIS — I82441 Acute embolism and thrombosis of right tibial vein: Secondary | ICD-10-CM | POA: Diagnosis not present

## 2017-04-10 DIAGNOSIS — Z6841 Body Mass Index (BMI) 40.0 and over, adult: Secondary | ICD-10-CM | POA: Diagnosis not present

## 2017-04-10 DIAGNOSIS — J45909 Unspecified asthma, uncomplicated: Secondary | ICD-10-CM | POA: Diagnosis not present

## 2017-04-10 DIAGNOSIS — E782 Mixed hyperlipidemia: Secondary | ICD-10-CM | POA: Diagnosis not present

## 2017-04-10 DIAGNOSIS — E1165 Type 2 diabetes mellitus with hyperglycemia: Secondary | ICD-10-CM | POA: Diagnosis not present

## 2017-04-10 DIAGNOSIS — M109 Gout, unspecified: Secondary | ICD-10-CM | POA: Diagnosis not present

## 2017-04-10 DIAGNOSIS — Z9989 Dependence on other enabling machines and devices: Secondary | ICD-10-CM | POA: Diagnosis not present

## 2017-04-10 DIAGNOSIS — I25118 Atherosclerotic heart disease of native coronary artery with other forms of angina pectoris: Secondary | ICD-10-CM | POA: Diagnosis not present

## 2017-04-10 DIAGNOSIS — N183 Chronic kidney disease, stage 3 (moderate): Secondary | ICD-10-CM | POA: Diagnosis not present

## 2017-04-10 DIAGNOSIS — E1142 Type 2 diabetes mellitus with diabetic polyneuropathy: Secondary | ICD-10-CM | POA: Diagnosis not present

## 2017-04-10 DIAGNOSIS — M79604 Pain in right leg: Secondary | ICD-10-CM | POA: Diagnosis not present

## 2017-04-10 DIAGNOSIS — E1122 Type 2 diabetes mellitus with diabetic chronic kidney disease: Secondary | ICD-10-CM | POA: Diagnosis not present

## 2017-04-10 DIAGNOSIS — H4312 Vitreous hemorrhage, left eye: Secondary | ICD-10-CM | POA: Diagnosis not present

## 2017-04-10 DIAGNOSIS — I130 Hypertensive heart and chronic kidney disease with heart failure and stage 1 through stage 4 chronic kidney disease, or unspecified chronic kidney disease: Secondary | ICD-10-CM | POA: Diagnosis not present

## 2017-04-10 DIAGNOSIS — R5383 Other fatigue: Secondary | ICD-10-CM | POA: Diagnosis not present

## 2017-04-10 DIAGNOSIS — I255 Ischemic cardiomyopathy: Secondary | ICD-10-CM | POA: Diagnosis not present

## 2017-04-10 DIAGNOSIS — S8251XA Displaced fracture of medial malleolus of right tibia, initial encounter for closed fracture: Secondary | ICD-10-CM | POA: Diagnosis not present

## 2017-04-10 DIAGNOSIS — G4733 Obstructive sleep apnea (adult) (pediatric): Secondary | ICD-10-CM | POA: Diagnosis not present

## 2017-04-10 DIAGNOSIS — E669 Obesity, unspecified: Secondary | ICD-10-CM | POA: Diagnosis not present

## 2017-04-10 DIAGNOSIS — I10 Essential (primary) hypertension: Secondary | ICD-10-CM | POA: Diagnosis not present

## 2017-04-17 ENCOUNTER — Encounter (INDEPENDENT_AMBULATORY_CARE_PROVIDER_SITE_OTHER): Payer: PPO | Admitting: Ophthalmology

## 2017-04-17 DIAGNOSIS — H43812 Vitreous degeneration, left eye: Secondary | ICD-10-CM

## 2017-04-17 DIAGNOSIS — I10 Essential (primary) hypertension: Secondary | ICD-10-CM | POA: Diagnosis not present

## 2017-04-17 DIAGNOSIS — E10311 Type 1 diabetes mellitus with unspecified diabetic retinopathy with macular edema: Secondary | ICD-10-CM | POA: Diagnosis not present

## 2017-04-17 DIAGNOSIS — E103593 Type 1 diabetes mellitus with proliferative diabetic retinopathy without macular edema, bilateral: Secondary | ICD-10-CM

## 2017-04-17 DIAGNOSIS — H35033 Hypertensive retinopathy, bilateral: Secondary | ICD-10-CM

## 2017-04-17 DIAGNOSIS — H4312 Vitreous hemorrhage, left eye: Secondary | ICD-10-CM

## 2017-04-18 ENCOUNTER — Emergency Department: Payer: PPO

## 2017-04-18 ENCOUNTER — Emergency Department
Admission: EM | Admit: 2017-04-18 | Discharge: 2017-04-18 | Disposition: A | Discharge status: Home / Self Care | Payer: PPO | Attending: Emergency Medicine | Admitting: Emergency Medicine

## 2017-04-18 ENCOUNTER — Encounter: Payer: Self-pay | Admitting: Emergency Medicine

## 2017-04-18 DIAGNOSIS — I2510 Atherosclerotic heart disease of native coronary artery without angina pectoris: Secondary | ICD-10-CM | POA: Diagnosis not present

## 2017-04-18 DIAGNOSIS — S93601A Unspecified sprain of right foot, initial encounter: Secondary | ICD-10-CM | POA: Diagnosis not present

## 2017-04-18 DIAGNOSIS — I129 Hypertensive chronic kidney disease with stage 1 through stage 4 chronic kidney disease, or unspecified chronic kidney disease: Secondary | ICD-10-CM | POA: Insufficient documentation

## 2017-04-18 DIAGNOSIS — Y929 Unspecified place or not applicable: Secondary | ICD-10-CM | POA: Diagnosis not present

## 2017-04-18 DIAGNOSIS — I252 Old myocardial infarction: Secondary | ICD-10-CM | POA: Insufficient documentation

## 2017-04-18 DIAGNOSIS — N184 Chronic kidney disease, stage 4 (severe): Secondary | ICD-10-CM | POA: Diagnosis not present

## 2017-04-18 DIAGNOSIS — Y939 Activity, unspecified: Secondary | ICD-10-CM | POA: Diagnosis not present

## 2017-04-18 DIAGNOSIS — Z794 Long term (current) use of insulin: Secondary | ICD-10-CM | POA: Diagnosis not present

## 2017-04-18 DIAGNOSIS — Z79899 Other long term (current) drug therapy: Secondary | ICD-10-CM | POA: Diagnosis not present

## 2017-04-18 DIAGNOSIS — M79671 Pain in right foot: Secondary | ICD-10-CM

## 2017-04-18 DIAGNOSIS — E11621 Type 2 diabetes mellitus with foot ulcer: Secondary | ICD-10-CM | POA: Insufficient documentation

## 2017-04-18 DIAGNOSIS — S82841A Displaced bimalleolar fracture of right lower leg, initial encounter for closed fracture: Secondary | ICD-10-CM | POA: Diagnosis not present

## 2017-04-18 DIAGNOSIS — L97529 Non-pressure chronic ulcer of other part of left foot with unspecified severity: Secondary | ICD-10-CM | POA: Insufficient documentation

## 2017-04-18 DIAGNOSIS — S93401A Sprain of unspecified ligament of right ankle, initial encounter: Secondary | ICD-10-CM | POA: Diagnosis not present

## 2017-04-18 DIAGNOSIS — X501XXA Overexertion from prolonged static or awkward postures, initial encounter: Secondary | ICD-10-CM | POA: Insufficient documentation

## 2017-04-18 DIAGNOSIS — L89501 Pressure ulcer of unspecified ankle, stage 1: Secondary | ICD-10-CM

## 2017-04-18 DIAGNOSIS — S99911A Unspecified injury of right ankle, initial encounter: Secondary | ICD-10-CM | POA: Diagnosis not present

## 2017-04-18 DIAGNOSIS — Y990 Civilian activity done for income or pay: Secondary | ICD-10-CM | POA: Insufficient documentation

## 2017-04-18 DIAGNOSIS — E039 Hypothyroidism, unspecified: Secondary | ICD-10-CM | POA: Diagnosis not present

## 2017-04-18 MED ORDER — OXYCODONE-ACETAMINOPHEN 5-325 MG PO TABS
1.0000 | ORAL_TABLET | Freq: Once | ORAL | Status: AC
  Administered 2017-04-18: 1 via ORAL
  Filled 2017-04-18: qty 1

## 2017-04-18 MED ORDER — CEPHALEXIN 500 MG PO CAPS: 500.0000 mg | ORAL_CAPSULE | Freq: Four times a day (QID) | ORAL | 0 refills | Status: AC

## 2017-04-18 MED ORDER — CEPHALEXIN 500 MG PO CAPS
500.0000 mg | ORAL_CAPSULE | Freq: Once | ORAL | Status: AC
  Administered 2017-04-18: 500 mg via ORAL
  Filled 2017-04-18: qty 1

## 2017-04-18 MED ORDER — HYDROMORPHONE HCL 1 MG/ML IJ SOLN
1.0000 mg | Freq: Once | INTRAMUSCULAR | Status: AC
  Administered 2017-04-18: 1 mg via INTRAMUSCULAR
  Filled 2017-04-18: qty 1

## 2017-04-18 MED ORDER — HYDROMORPHONE HCL 1 MG/ML IJ SOLN
INTRAMUSCULAR | Status: AC
  Filled 2017-04-18: qty 1

## 2017-04-18 MED ORDER — OXYCODONE-ACETAMINOPHEN 7.5-325 MG PO TABS: 1.0000 | ORAL_TABLET | ORAL | 0 refills | Status: AC | PRN

## 2017-04-18 MED ORDER — HYDROMORPHONE HCL 1 MG/ML IJ SOLN
1.0000 mg | Freq: Once | INTRAMUSCULAR | Status: AC
  Administered 2017-04-18: 1 mg via INTRAMUSCULAR

## 2017-04-18 NOTE — ED Provider Notes (Signed)
[facility] Emergency Department Provider Note   ____________________________________________   First MD Initiated Contact with Patient 04/18/17 1715     (approximate)  I have reviewed the triage vital signs and the nursing notes.   HISTORY  Chief Complaint Foot Pain    HPI [name] is a 51 y.o. male patient complaining of right foot pain. Patient has history of Charcot foot state he is waiting for some braces that were ordered and in January. Patient state he refractured his foot at work yesterday. Patient states felt the bones shift [place] incident at work. Patient stated pain increases with ambulation using the cane for support. Patient rates his pain as a 10 over 10. Patient described a pain as constant "sharp".o palliative measures prior to arrival.   Past Medical History:  Diagnosis Date  . Allergic rhinitis   . Anxiety    HX PANIC ATTACK  . CAD (coronary artery disease)   . Chronic kidney disease (CKD), stage IV (severe) (HCC)   . Complication of anesthesia    TROUBLE  WITH WAKING UP. SURGERY AMPUTATION AT [facility]  . Congestive heart failure (HCC)   . Diabetes mellitus type II   . GERD (gastroesophageal reflux disease)   . Gout   . History of nephrolithiasis   . Hyperlipidemia   . Hypertension   . Hypothyroidism   . Low back pain   . Myocardial infarction (HCC)   . Neuropathy   . Sleep apnea    CPAP   . Thyroid disease     Patient Active Problem List   Diagnosis Date Noted  . 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 Controlled type 2 diabetes with leg or foot ulcer 05/29/2016  . STAPHYLOCOCCAL INFECTION 11/15/2007  . DIABETES MELLITUS, TYPE II 11/15/2007  . HYPERLIPIDEMIA 11/15/2007  . HYPERTENSION 11/15/2007  . CORONARY ARTERY DISEASE 11/15/2007  . CONGESTIVE HEART FAILURE 11/15/2007  . ALLERGIC RHINITIS 11/15/2007  . GERD 11/15/2007  . LOW BACK PAIN 11/15/2007  . VIRAL  MENINGITIS, HX OF 11/15/2007  . NEPHROLITHIASIS, HX OF 11/15/2007    Past Surgical History:  Procedure Laterality Date  . 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 CORONARY ANGIOPLASTY WITH STENT PLACEMENT     2008  . GAS/FLUID EXCHANGE Right 02/10/2017   Procedure: GAS/FLUID EXCHANGE;  Surgeon: Sherrie [name], MD;  Location: [facility] OR;  Service: Ophthalmology;  Laterality: Right;  . LASER PHOTO ABLATION Right 02/10/2017   Procedure: LASER PHOTO ABLATION;  Surgeon: Sherrie [name], MD;  Location[facility] OR;  Service: Ophthalmology;  Laterality: Right;  . MEMBRANE PEEL Right 02/10/2017   Procedure: MEMBRANE PEEL;  Surgeon: Sherrie [name], MD;  Location: [facility] OR;  Service: Ophthalmology;  Laterality: Right;  . PARS PLANA VITRECTOMY 27 GAUGE WITH 25 GAUGE PORT Right 02/10/2017   Procedure: PARS PLANA VITRECTOMY 27 GAUGE WITH 27 GAUGE PORT;  Surgeon: Sherrie [name], MD;  Location: [facility] OR;  Service: Ophthalmology;  Laterality: Right;  . REFRACTIVE SURGERY     RIGHT EYE  . REPAIR OF COMPLEX TRACTION RETINAL DETACHMENT Right 02/10/2017   Procedure: REPAIR OF COMPLEX TRACTION RETINAL DETACHMENT;  Surgeon: Sherrie [name], MD;  Location: [facility] OR;  Service: Ophthalmology;  Laterality: Right;  . stress cardiolite    . TRACHEOSTOMY     2012    (NUT ALLERGY-    [place] ICU)    Prior to Admission medications   Medication Sig Start  Date End Date Taking? Authorizing Provider  albuterol (VENTOLIN HFA) 108 (90 Base) MCG/ACT inhaler Inhale 2 puffs into the lungs every 6 (six) hours as needed. 03/07/15 01/27/18  [provider]  allopurinol (ZYLOPRIM) 100 MG tablet Take 100 mg by mouth daily. 11/14/15   [provider]  bacitracin-polymyxin b (POLYSPORIN) ophthalmic ointment Place 1 application into the right eye 3 (three) times daily. apply to eye every 12 hours while awake 02/11/17   Sherrie GeorgeMatthews, John D, MD  carvedilol (COREG) 6.25 MG tablet Take 6.25 mg by mouth every 12 (twelve) hours.    [provider]  clobetasol ointment (TEMOVATE) 0.05 % Apply 1 application topically 2 (two) times daily as needed (for itchy skin).  06/18/15   [provider]  clopidogrel (PLAVIX) 75 MG tablet Take 75 mg by mouth daily. 07/31/15   [provider]  cyclobenzaprine (FLEXERIL) 10 MG tablet Take 1 tablet (10 mg total) by mouth 3 (three) times daily as needed. Patient taking differently: Take 10 mg by mouth 3 (three) times daily as needed for muscle spasms.  09/12/16   Darci CurrentBrown, Idanha N, MD  doxycycline (VIBRA-TABS) 100 MG tablet Take 100 mg by mouth See admin instructions. Once to twice daily (typically twice daily) 11/14/15   [provider]  fluticasone (FLONASE) 50 MCG/ACT nasal spray Place 1 spray into the nose 2 (two) times daily as needed (for allergies.).  10/04/14   [provider]  gabapentin (NEURONTIN) 300 MG capsule TAKE ONE CAPSULE BY MOUTH TWICE DAILY Patient taking differently: TAKE 1 CAPSULE (300 MG) BY MOUTH THREE TIMES DAILY 01/17/13   Sherlene Shamsullo, Teresa L, MD  gatifloxacin (ZYMAXID) 0.5 % SOLN Place 1 drop into the right eye 4 (four) times daily. 02/11/17   Sherrie GeorgeMatthews, John D, MD  ibuprofen (ADVIL,MOTRIN) 800 MG tablet Take 800 mg by mouth every 8 (eight) hours as needed (for pain.).  03/04/13   [provider]  insulin aspart (NOVOLOG FLEXPEN) 100 UNIT/ML FlexPen Inject 12 Units into the skin 4 (four) times daily as needed for high blood sugar (blood sugar greater than 200). Plus sliding scale  09/04/15   [provider]  Insulin Glargine (TOUJEO SOLOSTAR) 300 UNIT/ML SOPN Inject 40 Units into the skin 2 (two) times daily.  10/17/15   [provider]  levothyroxine (SYNTHROID, LEVOTHROID) 75 MCG tablet Take 75 mcg by mouth daily before breakfast. Take on a empty stomach. 09/07/15 01/27/18  [provider]  loratadine (CLARITIN) 10 MG tablet Take 10 mg by mouth daily. 03/07/15   [provider]  magnesium oxide (MAG-OX)  400 MG tablet Take 400 mg by mouth daily.      [provider]  metoCLOPramide (REGLAN) 10 MG tablet Take 10 mg by mouth 3 (three) times daily.    [provider]  metolazone (ZAROXOLYN) 5 MG tablet Take 2.5-5 mg by mouth as directed. TAKE 0.5-1 TABLET (2.5 MG-5 MG) DAILY AS NEEDED FOR WEIGHT GAIN GREATER THAN 5 LBS (FLUID RETENTION)    [provider]  mupirocin ointment (BACTROBAN) 2 % Apply 1 application topically 2 (two) times daily. Patient taking differently: Apply 1 application topically 2 (two) times daily as needed (for itchy skin/irritation).  02/01/16   Vivi BarrackWagoner, Matthew R, DPM  nitroGLYCERIN (NITROSTAT) 0.4 MG SL tablet Place 1 tablet under the tongue every 5 (five) minutes x 3 doses as needed. 04/07/15 01/27/18  [provider]  Omeprazole-Sodium Bicarbonate (ZEGERID) 20-1100 MG CAPS capsule Take 1 capsule by mouth daily.  08/30/09   [provider]  oxyCODONE-acetaminophen (PERCOCET) 7.5-325 MG tablet Take 1 tablet by mouth every 4 (four) hours as needed for severe pain. 04/18/17 04/18/18  Joni Reining, PA-C  oxyCODONE-acetaminophen (PERCOCET/ROXICET) 5-325 MG tablet Take 1-2 tablets by mouth every 6 (six) hours as needed (for moderate to severe pain.).  10/15/13   [provider]  prednisoLONE acetate (PRED FORTE) 1 % ophthalmic suspension Place 1 drop into the right eye 4 (four) times daily. 02/11/17   Sherrie [name], MD  senna (SENNA-TIME) 8.6 MG tablet Take 1-2 tablets by mouth daily as needed (for mild to moderate constipation).  10/18/08   [provider]  spironolactone (ALDACTONE) 25 MG tablet Take 25 mg by mouth daily.      [provider]  torsemide (DEMADEX) 20 MG tablet Take 20 mg by mouth 2 (two) times daily.  03/17/14   [provider]  vitamin C (ASCORBIC ACID) 500 MG tablet Take 500 mg by mouth daily.    [provider]  zinc gluconate 50 MG tablet Take 50 mg by mouth daily.    [provider]    Allergies Other; Pravastatin; Ace inhibitors; Iodinated diagnostic agents; Sulfamethoxazole-trimethoprim; and Amitriptyline  History reviewed. No pertinent family history.  Social History Social History  Substance Use Topics  . Smoking status: Never Smoker  . Smokeless tobacco: Never Used  . Alcohol use No    Review of Systems  Constitutional: No fever/chills Eyes: No visual changes. ENT: No sore throat. Cardiovascular: Denies chest pain. Respiratory: Denies shortness of breath. Gastrointestinal: No abdominal pain.  No nausea, no vomiting.  No diarrhea.  No constipation. Genitourinary: Negative for dysuria. Musculoskeletal:right foot and ankle pain Skin: Negative for rash. Ulcer on the lateral malleolus Neurological: Negative for headaches, focal weakness or numbness. Endocrine:diabetes  hyperlipidemia, hypertension, and hypothyroidism. Hematological/Lymphatic: Allergic/Immunilogical:see medication list ____________________________________________   PHYSICAL EXAM:  VITAL SIGNS: ED Triage Vitals  Enc Vitals Group     BP 04/18/17 1627 121/75     Pulse Rate 04/18/17 1627 76     Resp 04/18/17 1627 18     Temp 04/18/17 1627 98.4 F (36.9 C)     Temp Source 04/18/17 1627 Oral     SpO2 04/18/17 1627 94 %     Weight 04/18/17 1628 266 lb (120.7 kg)     Height 04/18/17 1628 5\' 6"  (1.676 m)     Head Circumference --      Peak Flow --      Pain Score 04/18/17 1626 10     Pain Loc --      Pain Edu? --      Excl. in GC? --     Constitutional: Alert and oriented. Well appearing and in no acute distress. Cardiovascular: Normal rate, regular rhythm. Grossly normal heart sounds.  Good peripheral circulation. Respiratory: Normal respiratory effort.  No retractions. Lungs CTAB. Musculoskeletal: obvious deformity to the right foot ankle secondary to patient condition. Neurologic:  Normal speech and language. No gross focal neurologic deficits are  appreciated. No gait instability. Skin:  Skin is warm, dry and intact. No rash noted. Psychiatric: Mood and affect are normal. Speech and behavior are normal.  ____________________________________________   LABS (all labs ordered are listed, but only abnormal results are displayed)  Labs Reviewed - No data to display ____________________________________________  EKG   ____________________________________________  RADIOLOGY  Acute medial malleolus fracture the distal tibia with a 2.7 superior migration bilateral lower fragment patient also has  a lateral dislocation of the distal tib-fib. ____________________________________________   PROCEDURES  Procedure(s) performed: None  Procedures  Critical Care performed: No  ____________________________________________   INITIAL IMPRESSION / ASSESSMENT AND PLAN / ED COURSE  Pertinent labs & imaging results that were available during my care of the patient were reviewed by me and considered in my medical decision making (see chart for details).  Patient has acute medial malleolus fracture of the distal tib-fib with superior migration of a 2.7 cm medial malodor fragment. Patient also has a lateral dislocation of the distal tibia with respect to the remainder of the foot. Discussed x-ray with patient and will attempt mild reduction and splinting before departure. Patient is advised to follow orthopedics by calling for an appointment Monday morning.      ____________________________________________   FINAL CLINICAL IMPRESSION(S) / ED DIAGNOSES  Final diagnoses:  Ankle fracture, bimalleolar, closed, right, initial encounter      NEW MEDICATIONS STARTED DURING THIS VISIT:  New Prescriptions   OXYCODONE-ACETAMINOPHEN (PERCOCET) 7.5-325 MG TABLET    Take 1 tablet by mouth every 4 (four) hours as needed for severe pain.     Note:  This document was prepared using Dragon voice recognition software and may include unintentional  dictation errors.    Joni Reining, PA-C 04/18/17 1859    Joni Reining, PA-C 04/18/17 1900    Joni Reining, PA-C 04/18/17 1917    Merrily Brittle, MD 04/18/17 2109

## 2017-04-18 NOTE — ED Triage Notes (Signed)
Pt here for right foot pain. Pt has hx of Charcot's foot and states he is waiting for braces since January. Pt states he thinks he refractured his right foot. States the bone has shifted. Pt is wearing boots at this time. Using cane to ambulate. Placed in wheelchair on arrival to ED.

## 2017-04-18 NOTE — Discharge Instructions (Signed)
Wear splint and follow orthopedics.

## 2017-04-20 ENCOUNTER — Encounter: Payer: PPO | Admitting: Podiatry

## 2017-04-20 DIAGNOSIS — E11610 Type 2 diabetes mellitus with diabetic neuropathic arthropathy: Secondary | ICD-10-CM | POA: Diagnosis not present

## 2017-04-20 DIAGNOSIS — Z9861 Coronary angioplasty status: Secondary | ICD-10-CM | POA: Diagnosis not present

## 2017-04-20 DIAGNOSIS — G4733 Obstructive sleep apnea (adult) (pediatric): Secondary | ICD-10-CM | POA: Diagnosis not present

## 2017-04-20 DIAGNOSIS — X58XXXA Exposure to other specified factors, initial encounter: Secondary | ICD-10-CM | POA: Diagnosis not present

## 2017-04-20 DIAGNOSIS — E1165 Type 2 diabetes mellitus with hyperglycemia: Secondary | ICD-10-CM | POA: Diagnosis not present

## 2017-04-20 DIAGNOSIS — E1151 Type 2 diabetes mellitus with diabetic peripheral angiopathy without gangrene: Secondary | ICD-10-CM | POA: Diagnosis not present

## 2017-04-20 DIAGNOSIS — L97909 Non-pressure chronic ulcer of unspecified part of unspecified lower leg with unspecified severity: Secondary | ICD-10-CM | POA: Diagnosis not present

## 2017-04-20 DIAGNOSIS — L97419 Non-pressure chronic ulcer of right heel and midfoot with unspecified severity: Secondary | ICD-10-CM | POA: Diagnosis not present

## 2017-04-20 DIAGNOSIS — R509 Fever, unspecified: Secondary | ICD-10-CM | POA: Diagnosis not present

## 2017-04-20 DIAGNOSIS — R262 Difficulty in walking, not elsewhere classified: Secondary | ICD-10-CM | POA: Diagnosis not present

## 2017-04-20 DIAGNOSIS — E669 Obesity, unspecified: Secondary | ICD-10-CM | POA: Diagnosis not present

## 2017-04-20 DIAGNOSIS — I2510 Atherosclerotic heart disease of native coronary artery without angina pectoris: Secondary | ICD-10-CM | POA: Diagnosis not present

## 2017-04-20 DIAGNOSIS — R600 Localized edema: Secondary | ICD-10-CM | POA: Diagnosis not present

## 2017-04-20 DIAGNOSIS — E11621 Type 2 diabetes mellitus with foot ulcer: Secondary | ICD-10-CM | POA: Diagnosis not present

## 2017-04-20 DIAGNOSIS — J45909 Unspecified asthma, uncomplicated: Secondary | ICD-10-CM | POA: Diagnosis not present

## 2017-04-20 DIAGNOSIS — M14671 Charcot's joint, right ankle and foot: Secondary | ICD-10-CM | POA: Diagnosis not present

## 2017-04-20 DIAGNOSIS — S8251XA Displaced fracture of medial malleolus of right tibia, initial encounter for closed fracture: Secondary | ICD-10-CM | POA: Diagnosis not present

## 2017-04-20 DIAGNOSIS — N184 Chronic kidney disease, stage 4 (severe): Secondary | ICD-10-CM | POA: Diagnosis not present

## 2017-04-20 DIAGNOSIS — M109 Gout, unspecified: Secondary | ICD-10-CM | POA: Diagnosis not present

## 2017-04-20 DIAGNOSIS — M79604 Pain in right leg: Secondary | ICD-10-CM | POA: Diagnosis not present

## 2017-04-20 DIAGNOSIS — I255 Ischemic cardiomyopathy: Secondary | ICD-10-CM | POA: Diagnosis not present

## 2017-04-20 DIAGNOSIS — M25571 Pain in right ankle and joints of right foot: Secondary | ICD-10-CM | POA: Diagnosis not present

## 2017-04-20 DIAGNOSIS — E1122 Type 2 diabetes mellitus with diabetic chronic kidney disease: Secondary | ICD-10-CM | POA: Diagnosis not present

## 2017-04-20 DIAGNOSIS — I130 Hypertensive heart and chronic kidney disease with heart failure and stage 1 through stage 4 chronic kidney disease, or unspecified chronic kidney disease: Secondary | ICD-10-CM | POA: Diagnosis not present

## 2017-04-20 DIAGNOSIS — E1140 Type 2 diabetes mellitus with diabetic neuropathy, unspecified: Secondary | ICD-10-CM | POA: Diagnosis not present

## 2017-04-20 DIAGNOSIS — Z8614 Personal history of Methicillin resistant Staphylococcus aureus infection: Secondary | ICD-10-CM | POA: Diagnosis not present

## 2017-04-20 DIAGNOSIS — L97319 Non-pressure chronic ulcer of right ankle with unspecified severity: Secondary | ICD-10-CM | POA: Diagnosis not present

## 2017-04-20 DIAGNOSIS — L97511 Non-pressure chronic ulcer of other part of right foot limited to breakdown of skin: Secondary | ICD-10-CM | POA: Diagnosis not present

## 2017-04-20 DIAGNOSIS — S82841A Displaced bimalleolar fracture of right lower leg, initial encounter for closed fracture: Secondary | ICD-10-CM | POA: Diagnosis not present

## 2017-04-20 DIAGNOSIS — M84461A Pathological fracture, right tibia, initial encounter for fracture: Secondary | ICD-10-CM | POA: Diagnosis not present

## 2017-04-20 DIAGNOSIS — I5022 Chronic systolic (congestive) heart failure: Secondary | ICD-10-CM | POA: Diagnosis not present

## 2017-04-20 DIAGNOSIS — Y33XXXA Other specified events, undetermined intent, initial encounter: Secondary | ICD-10-CM | POA: Diagnosis not present

## 2017-04-20 DIAGNOSIS — E039 Hypothyroidism, unspecified: Secondary | ICD-10-CM | POA: Diagnosis not present

## 2017-04-20 DIAGNOSIS — Z89512 Acquired absence of left leg below knee: Secondary | ICD-10-CM | POA: Diagnosis not present

## 2017-04-20 DIAGNOSIS — D649 Anemia, unspecified: Secondary | ICD-10-CM | POA: Diagnosis not present

## 2017-04-20 NOTE — Addendum Note (Signed)
Addendum  created 04/20/17 1249 by Val EagleMoser, Gordy Goar, MD   Sign clinical note

## 2017-04-22 ENCOUNTER — Encounter (INDEPENDENT_AMBULATORY_CARE_PROVIDER_SITE_OTHER): Payer: Self-pay | Admitting: Ophthalmology

## 2017-05-01 DIAGNOSIS — E11610 Type 2 diabetes mellitus with diabetic neuropathic arthropathy: Secondary | ICD-10-CM | POA: Diagnosis not present

## 2017-05-01 DIAGNOSIS — S82891A Other fracture of right lower leg, initial encounter for closed fracture: Secondary | ICD-10-CM | POA: Diagnosis not present

## 2017-05-04 DIAGNOSIS — Z89512 Acquired absence of left leg below knee: Secondary | ICD-10-CM | POA: Diagnosis not present

## 2017-05-04 DIAGNOSIS — I5023 Acute on chronic systolic (congestive) heart failure: Secondary | ICD-10-CM | POA: Diagnosis not present

## 2017-05-04 DIAGNOSIS — X58XXXA Exposure to other specified factors, initial encounter: Secondary | ICD-10-CM | POA: Diagnosis not present

## 2017-05-04 DIAGNOSIS — M14671 Charcot's joint, right ankle and foot: Secondary | ICD-10-CM | POA: Diagnosis not present

## 2017-05-04 DIAGNOSIS — E1165 Type 2 diabetes mellitus with hyperglycemia: Secondary | ICD-10-CM | POA: Diagnosis not present

## 2017-05-04 DIAGNOSIS — I5022 Chronic systolic (congestive) heart failure: Secondary | ICD-10-CM | POA: Diagnosis not present

## 2017-05-04 DIAGNOSIS — I25118 Atherosclerotic heart disease of native coronary artery with other forms of angina pectoris: Secondary | ICD-10-CM | POA: Diagnosis not present

## 2017-05-04 DIAGNOSIS — N184 Chronic kidney disease, stage 4 (severe): Secondary | ICD-10-CM | POA: Diagnosis not present

## 2017-05-04 DIAGNOSIS — S8251XA Displaced fracture of medial malleolus of right tibia, initial encounter for closed fracture: Secondary | ICD-10-CM | POA: Diagnosis not present

## 2017-05-05 DIAGNOSIS — H2513 Age-related nuclear cataract, bilateral: Secondary | ICD-10-CM | POA: Diagnosis not present

## 2017-05-05 DIAGNOSIS — H25011 Cortical age-related cataract, right eye: Secondary | ICD-10-CM | POA: Diagnosis not present

## 2017-05-05 DIAGNOSIS — H25043 Posterior subcapsular polar age-related cataract, bilateral: Secondary | ICD-10-CM | POA: Diagnosis not present

## 2017-05-05 DIAGNOSIS — H4312 Vitreous hemorrhage, left eye: Secondary | ICD-10-CM | POA: Diagnosis not present

## 2017-05-05 DIAGNOSIS — H25013 Cortical age-related cataract, bilateral: Secondary | ICD-10-CM | POA: Diagnosis not present

## 2017-05-05 DIAGNOSIS — H2511 Age-related nuclear cataract, right eye: Secondary | ICD-10-CM | POA: Diagnosis not present

## 2017-05-14 ENCOUNTER — Emergency Department: Payer: PPO

## 2017-05-14 ENCOUNTER — Encounter: Payer: Self-pay | Admitting: Emergency Medicine

## 2017-05-14 ENCOUNTER — Inpatient Hospital Stay
Admission: EM | Admit: 2017-05-14 | Discharge: 2017-06-17 | DRG: 871 | Disposition: E | Payer: PPO | Attending: Specialist | Admitting: Specialist

## 2017-05-14 DIAGNOSIS — Z89512 Acquired absence of left leg below knee: Secondary | ICD-10-CM

## 2017-05-14 DIAGNOSIS — Z7902 Long term (current) use of antithrombotics/antiplatelets: Secondary | ICD-10-CM

## 2017-05-14 DIAGNOSIS — N179 Acute kidney failure, unspecified: Secondary | ICD-10-CM | POA: Diagnosis not present

## 2017-05-14 DIAGNOSIS — L97509 Non-pressure chronic ulcer of other part of unspecified foot with unspecified severity: Secondary | ICD-10-CM | POA: Diagnosis not present

## 2017-05-14 DIAGNOSIS — E11610 Type 2 diabetes mellitus with diabetic neuropathic arthropathy: Secondary | ICD-10-CM | POA: Diagnosis not present

## 2017-05-14 DIAGNOSIS — R6510 Systemic inflammatory response syndrome (SIRS) of non-infectious origin without acute organ dysfunction: Secondary | ICD-10-CM

## 2017-05-14 DIAGNOSIS — N184 Chronic kidney disease, stage 4 (severe): Secondary | ICD-10-CM | POA: Diagnosis not present

## 2017-05-14 DIAGNOSIS — R402434 Glasgow coma scale score 3-8, 24 hours or more after hospital admission: Secondary | ICD-10-CM | POA: Diagnosis not present

## 2017-05-14 DIAGNOSIS — A419 Sepsis, unspecified organism: Principal | ICD-10-CM | POA: Diagnosis present

## 2017-05-14 DIAGNOSIS — E039 Hypothyroidism, unspecified: Secondary | ICD-10-CM | POA: Diagnosis not present

## 2017-05-14 DIAGNOSIS — E872 Acidosis: Secondary | ICD-10-CM | POA: Diagnosis not present

## 2017-05-14 DIAGNOSIS — E11622 Type 2 diabetes mellitus with other skin ulcer: Secondary | ICD-10-CM | POA: Diagnosis present

## 2017-05-14 DIAGNOSIS — I469 Cardiac arrest, cause unspecified: Secondary | ICD-10-CM | POA: Diagnosis not present

## 2017-05-14 DIAGNOSIS — E1140 Type 2 diabetes mellitus with diabetic neuropathy, unspecified: Secondary | ICD-10-CM | POA: Diagnosis present

## 2017-05-14 DIAGNOSIS — Z91018 Allergy to other foods: Secondary | ICD-10-CM | POA: Diagnosis not present

## 2017-05-14 DIAGNOSIS — K219 Gastro-esophageal reflux disease without esophagitis: Secondary | ICD-10-CM | POA: Diagnosis not present

## 2017-05-14 DIAGNOSIS — L03115 Cellulitis of right lower limb: Secondary | ICD-10-CM | POA: Diagnosis present

## 2017-05-14 DIAGNOSIS — M869 Osteomyelitis, unspecified: Secondary | ICD-10-CM | POA: Diagnosis not present

## 2017-05-14 DIAGNOSIS — L97319 Non-pressure chronic ulcer of right ankle with unspecified severity: Secondary | ICD-10-CM | POA: Diagnosis present

## 2017-05-14 DIAGNOSIS — I120 Hypertensive chronic kidney disease with stage 5 chronic kidney disease or end stage renal disease: Secondary | ICD-10-CM | POA: Diagnosis not present

## 2017-05-14 DIAGNOSIS — T502X5A Adverse effect of carbonic-anhydrase inhibitors, benzothiadiazides and other diuretics, initial encounter: Secondary | ICD-10-CM | POA: Diagnosis not present

## 2017-05-14 DIAGNOSIS — L03119 Cellulitis of unspecified part of limb: Secondary | ICD-10-CM

## 2017-05-14 DIAGNOSIS — E11649 Type 2 diabetes mellitus with hypoglycemia without coma: Secondary | ICD-10-CM | POA: Diagnosis present

## 2017-05-14 DIAGNOSIS — I2510 Atherosclerotic heart disease of native coronary artery without angina pectoris: Secondary | ICD-10-CM | POA: Diagnosis not present

## 2017-05-14 DIAGNOSIS — G4733 Obstructive sleep apnea (adult) (pediatric): Secondary | ICD-10-CM | POA: Diagnosis present

## 2017-05-14 DIAGNOSIS — E1122 Type 2 diabetes mellitus with diabetic chronic kidney disease: Secondary | ICD-10-CM | POA: Diagnosis present

## 2017-05-14 DIAGNOSIS — L0390 Cellulitis, unspecified: Secondary | ICD-10-CM | POA: Diagnosis present

## 2017-05-14 DIAGNOSIS — M109 Gout, unspecified: Secondary | ICD-10-CM | POA: Diagnosis present

## 2017-05-14 DIAGNOSIS — E785 Hyperlipidemia, unspecified: Secondary | ICD-10-CM | POA: Diagnosis not present

## 2017-05-14 DIAGNOSIS — I10 Essential (primary) hypertension: Secondary | ICD-10-CM | POA: Diagnosis not present

## 2017-05-14 DIAGNOSIS — N186 End stage renal disease: Secondary | ICD-10-CM | POA: Diagnosis not present

## 2017-05-14 DIAGNOSIS — L98499 Non-pressure chronic ulcer of skin of other sites with unspecified severity: Secondary | ICD-10-CM

## 2017-05-14 DIAGNOSIS — I252 Old myocardial infarction: Secondary | ICD-10-CM

## 2017-05-14 DIAGNOSIS — I130 Hypertensive heart and chronic kidney disease with heart failure and stage 1 through stage 4 chronic kidney disease, or unspecified chronic kidney disease: Secondary | ICD-10-CM | POA: Diagnosis not present

## 2017-05-14 DIAGNOSIS — N170 Acute kidney failure with tubular necrosis: Secondary | ICD-10-CM | POA: Diagnosis not present

## 2017-05-14 DIAGNOSIS — M14671 Charcot's joint, right ankle and foot: Secondary | ICD-10-CM

## 2017-05-14 DIAGNOSIS — Z888 Allergy status to other drugs, medicaments and biological substances status: Secondary | ICD-10-CM

## 2017-05-14 DIAGNOSIS — I509 Heart failure, unspecified: Secondary | ICD-10-CM | POA: Diagnosis not present

## 2017-05-14 DIAGNOSIS — N183 Chronic kidney disease, stage 3 unspecified: Secondary | ICD-10-CM

## 2017-05-14 DIAGNOSIS — Z794 Long term (current) use of insulin: Secondary | ICD-10-CM

## 2017-05-14 DIAGNOSIS — D72829 Elevated white blood cell count, unspecified: Secondary | ICD-10-CM | POA: Diagnosis not present

## 2017-05-14 DIAGNOSIS — M545 Low back pain: Secondary | ICD-10-CM | POA: Diagnosis not present

## 2017-05-14 DIAGNOSIS — Z87442 Personal history of urinary calculi: Secondary | ICD-10-CM | POA: Diagnosis not present

## 2017-05-14 DIAGNOSIS — S82899A Other fracture of unspecified lower leg, initial encounter for closed fracture: Secondary | ICD-10-CM | POA: Diagnosis not present

## 2017-05-14 DIAGNOSIS — L97314 Non-pressure chronic ulcer of right ankle with necrosis of bone: Secondary | ICD-10-CM | POA: Diagnosis not present

## 2017-05-14 DIAGNOSIS — Z882 Allergy status to sulfonamides status: Secondary | ICD-10-CM

## 2017-05-14 DIAGNOSIS — E875 Hyperkalemia: Secondary | ICD-10-CM | POA: Diagnosis not present

## 2017-05-14 DIAGNOSIS — E119 Type 2 diabetes mellitus without complications: Secondary | ICD-10-CM | POA: Diagnosis not present

## 2017-05-14 DIAGNOSIS — Z955 Presence of coronary angioplasty implant and graft: Secondary | ICD-10-CM | POA: Diagnosis not present

## 2017-05-14 DIAGNOSIS — E11628 Type 2 diabetes mellitus with other skin complications: Secondary | ICD-10-CM

## 2017-05-14 DIAGNOSIS — E1142 Type 2 diabetes mellitus with diabetic polyneuropathy: Secondary | ICD-10-CM | POA: Diagnosis not present

## 2017-05-14 DIAGNOSIS — R509 Fever, unspecified: Secondary | ICD-10-CM | POA: Diagnosis present

## 2017-05-14 DIAGNOSIS — S8291XA Unspecified fracture of right lower leg, initial encounter for closed fracture: Secondary | ICD-10-CM | POA: Diagnosis not present

## 2017-05-14 DIAGNOSIS — I5043 Acute on chronic combined systolic (congestive) and diastolic (congestive) heart failure: Secondary | ICD-10-CM | POA: Diagnosis not present

## 2017-05-14 DIAGNOSIS — J309 Allergic rhinitis, unspecified: Secondary | ICD-10-CM | POA: Diagnosis present

## 2017-05-14 DIAGNOSIS — F410 Panic disorder [episodic paroxysmal anxiety] without agoraphobia: Secondary | ICD-10-CM | POA: Diagnosis present

## 2017-05-14 LAB — CBC WITH DIFFERENTIAL/PLATELET
BASOS PCT: 0 %
Basophils Absolute: 0.1 10*3/uL (ref 0–0.1)
Eosinophils Absolute: 0.1 10*3/uL (ref 0–0.7)
Eosinophils Relative: 0 %
HEMATOCRIT: 35 % — AB (ref 40.0–52.0)
Hemoglobin: 11.5 g/dL — ABNORMAL LOW (ref 13.0–18.0)
LYMPHS ABS: 0.7 10*3/uL — AB (ref 1.0–3.6)
Lymphocytes Relative: 4 %
MCH: 28.5 pg (ref 26.0–34.0)
MCHC: 32.8 g/dL (ref 32.0–36.0)
MCV: 86.9 fL (ref 80.0–100.0)
MONOS PCT: 10 %
Monocytes Absolute: 1.6 10*3/uL — ABNORMAL HIGH (ref 0.2–1.0)
NEUTROS ABS: 13.5 10*3/uL — AB (ref 1.4–6.5)
NEUTROS PCT: 86 %
Platelets: 173 10*3/uL (ref 150–440)
RBC: 4.03 MIL/uL — AB (ref 4.40–5.90)
RDW: 15.9 % — ABNORMAL HIGH (ref 11.5–14.5)
WBC: 15.9 10*3/uL — ABNORMAL HIGH (ref 3.8–10.6)

## 2017-05-14 LAB — COMPREHENSIVE METABOLIC PANEL
ALBUMIN: 3.4 g/dL — AB (ref 3.5–5.0)
ALK PHOS: 143 U/L — AB (ref 38–126)
ALT: 24 U/L (ref 17–63)
AST: 23 U/L (ref 15–41)
Anion gap: 9 (ref 5–15)
BILIRUBIN TOTAL: 0.8 mg/dL (ref 0.3–1.2)
BUN: 57 mg/dL — ABNORMAL HIGH (ref 6–20)
CHLORIDE: 107 mmol/L (ref 101–111)
CO2: 19 mmol/L — AB (ref 22–32)
Calcium: 7.4 mg/dL — ABNORMAL LOW (ref 8.9–10.3)
Creatinine, Ser: 2.24 mg/dL — ABNORMAL HIGH (ref 0.61–1.24)
GFR calc Af Amer: 37 mL/min — ABNORMAL LOW (ref >60)
GFR, EST NON AFRICAN AMERICAN: 32 mL/min — AB (ref >60)
Glucose, Bld: 173 mg/dL — ABNORMAL HIGH (ref 65–99)
POTASSIUM: 4.8 mmol/L (ref 3.5–5.1)
Sodium: 135 mmol/L (ref 135–145)
Total Protein: 7.2 g/dL (ref 6.5–8.1)

## 2017-05-14 LAB — GLUCOSE, CAPILLARY
GLUCOSE-CAPILLARY: 75 mg/dL (ref 65–99)
Glucose-Capillary: 61 mg/dL — ABNORMAL LOW (ref 65–99)
Glucose-Capillary: 148 mg/dL — ABNORMAL HIGH (ref 65–99)

## 2017-05-14 LAB — MRSA PCR SCREENING: MRSA BY PCR: NEGATIVE

## 2017-05-14 LAB — SEDIMENTATION RATE: Sed Rate: 54 mm/hr — ABNORMAL HIGH (ref 0–20)

## 2017-05-14 LAB — LACTIC ACID, PLASMA: LACTIC ACID, VENOUS: 1 mmol/L (ref 0.5–1.9)

## 2017-05-14 MED ORDER — PIPERACILLIN-TAZOBACTAM 3.375 G IVPB 30 MIN
3.3750 g | Freq: Once | INTRAVENOUS | Status: AC
  Administered 2017-05-14: 3.375 g via INTRAVENOUS
  Filled 2017-05-14 (×2): qty 50

## 2017-05-14 MED ORDER — TORSEMIDE 20 MG PO TABS
20.0000 mg | ORAL_TABLET | Freq: Two times a day (BID) | ORAL | Status: DC
  Administered 2017-05-14 – 2017-05-16 (×4): 20 mg via ORAL
  Filled 2017-05-14 (×5): qty 1

## 2017-05-14 MED ORDER — MORPHINE SULFATE (PF) 2 MG/ML IV SOLN
2.0000 mg | INTRAVENOUS | Status: DC | PRN
  Administered 2017-05-14 – 2017-05-15 (×3): 2 mg via INTRAVENOUS
  Filled 2017-05-14 (×3): qty 1

## 2017-05-14 MED ORDER — CLOPIDOGREL BISULFATE 75 MG PO TABS
75.0000 mg | ORAL_TABLET | Freq: Every day | ORAL | Status: DC
  Filled 2017-05-14 (×2): qty 1

## 2017-05-14 MED ORDER — FLUTICASONE PROPIONATE 50 MCG/ACT NA SUSP
1.0000 | Freq: Two times a day (BID) | NASAL | Status: DC | PRN
  Filled 2017-05-14: qty 16

## 2017-05-14 MED ORDER — VITAMIN C 500 MG PO TABS
500.0000 mg | ORAL_TABLET | Freq: Every day | ORAL | Status: DC
  Administered 2017-05-15 – 2017-05-18 (×4): 500 mg via ORAL
  Filled 2017-05-14 (×5): qty 1

## 2017-05-14 MED ORDER — CYCLOBENZAPRINE HCL 10 MG PO TABS
10.0000 mg | ORAL_TABLET | Freq: Three times a day (TID) | ORAL | Status: DC | PRN
  Administered 2017-05-16 – 2017-05-18 (×3): 10 mg via ORAL
  Filled 2017-05-14 (×3): qty 1

## 2017-05-14 MED ORDER — ZINC GLUCONATE 50 MG PO TABS
50.0000 mg | ORAL_TABLET | Freq: Every day | ORAL | Status: DC
  Filled 2017-05-14: qty 1

## 2017-05-14 MED ORDER — INSULIN ASPART 100 UNIT/ML ~~LOC~~ SOLN
0.0000 [IU] | Freq: Three times a day (TID) | SUBCUTANEOUS | Status: DC
  Administered 2017-05-16 – 2017-05-17 (×3): 1 [IU] via SUBCUTANEOUS
  Filled 2017-05-14: qty 1
  Filled 2017-05-14 (×4): qty 0.09
  Filled 2017-05-14: qty 1
  Filled 2017-05-14 (×7): qty 0.09
  Filled 2017-05-14: qty 1
  Filled 2017-05-14 (×5): qty 0.09
  Filled 2017-05-14: qty 1
  Filled 2017-05-14 (×2): qty 0.09

## 2017-05-14 MED ORDER — SENNOSIDES 8.6 MG PO TABS
1.0000 | ORAL_TABLET | Freq: Every day | ORAL | Status: DC | PRN
  Administered 2017-05-15: 8.6 mg via ORAL
  Filled 2017-05-14 (×2): qty 2

## 2017-05-14 MED ORDER — SPIRONOLACTONE 25 MG PO TABS
25.0000 mg | ORAL_TABLET | Freq: Every day | ORAL | Status: DC
  Administered 2017-05-15 – 2017-05-16 (×2): 25 mg via ORAL
  Filled 2017-05-14 (×2): qty 1

## 2017-05-14 MED ORDER — VANCOMYCIN HCL IN DEXTROSE 1-5 GM/200ML-% IV SOLN
1000.0000 mg | Freq: Once | INTRAVENOUS | Status: AC
  Administered 2017-05-14: 1000 mg via INTRAVENOUS
  Filled 2017-05-14 (×2): qty 200

## 2017-05-14 MED ORDER — LEVOTHYROXINE SODIUM 50 MCG PO TABS
75.0000 ug | ORAL_TABLET | Freq: Every day | ORAL | Status: DC
  Administered 2017-05-15 – 2017-05-18 (×4): 75 ug via ORAL
  Filled 2017-05-14 (×4): qty 1

## 2017-05-14 MED ORDER — HYDROMORPHONE HCL 1 MG/ML IJ SOLN
1.0000 mg | Freq: Once | INTRAMUSCULAR | Status: AC
  Administered 2017-05-14: 1 mg via INTRAVENOUS
  Filled 2017-05-14: qty 1

## 2017-05-14 MED ORDER — LORATADINE 10 MG PO TABS
10.0000 mg | ORAL_TABLET | Freq: Every day | ORAL | Status: DC
  Administered 2017-05-15 – 2017-05-18 (×4): 10 mg via ORAL
  Filled 2017-05-14 (×4): qty 1

## 2017-05-14 MED ORDER — METOCLOPRAMIDE HCL 10 MG PO TABS
10.0000 mg | ORAL_TABLET | Freq: Three times a day (TID) | ORAL | Status: DC
  Administered 2017-05-14 – 2017-05-18 (×10): 10 mg via ORAL
  Filled 2017-05-14 (×10): qty 1

## 2017-05-14 MED ORDER — ALLOPURINOL 100 MG PO TABS
100.0000 mg | ORAL_TABLET | Freq: Every day | ORAL | Status: DC
  Administered 2017-05-15 – 2017-05-18 (×4): 100 mg via ORAL
  Filled 2017-05-14 (×6): qty 1

## 2017-05-14 MED ORDER — GABAPENTIN 300 MG PO CAPS
300.0000 mg | ORAL_CAPSULE | Freq: Two times a day (BID) | ORAL | Status: DC
Start: 2017-05-14 — End: 2017-05-19
  Administered 2017-05-14 – 2017-05-18 (×9): 300 mg via ORAL
  Filled 2017-05-14 (×9): qty 1

## 2017-05-14 MED ORDER — MAGNESIUM OXIDE 400 (241.3 MG) MG PO TABS
400.0000 mg | ORAL_TABLET | Freq: Every day | ORAL | Status: DC
  Administered 2017-05-15 – 2017-05-18 (×4): 400 mg via ORAL
  Filled 2017-05-14 (×5): qty 1

## 2017-05-14 MED ORDER — OXYCODONE-ACETAMINOPHEN 7.5-325 MG PO TABS
1.0000 | ORAL_TABLET | ORAL | Status: DC | PRN
  Administered 2017-05-14 – 2017-05-15 (×2): 1 via ORAL
  Filled 2017-05-14 (×2): qty 1

## 2017-05-14 MED ORDER — HEPARIN SODIUM (PORCINE) 5000 UNIT/ML IJ SOLN
5000.0000 [IU] | Freq: Three times a day (TID) | INTRAMUSCULAR | Status: DC
  Administered 2017-05-14 – 2017-05-18 (×12): 5000 [IU] via SUBCUTANEOUS
  Filled 2017-05-14 (×12): qty 1

## 2017-05-14 MED ORDER — INSULIN GLARGINE 100 UNIT/ML ~~LOC~~ SOLN
40.0000 [IU] | Freq: Two times a day (BID) | SUBCUTANEOUS | Status: DC
  Administered 2017-05-14 – 2017-05-17 (×7): 40 [IU] via SUBCUTANEOUS
  Filled 2017-05-14 (×9): qty 0.4

## 2017-05-14 MED ORDER — DEXTROSE 5 % IV SOLN
2.0000 g | Freq: Two times a day (BID) | INTRAVENOUS | Status: DC
  Administered 2017-05-14 – 2017-05-17 (×6): 2 g via INTRAVENOUS
  Filled 2017-05-14 (×7): qty 2

## 2017-05-14 MED ORDER — INSULIN GLARGINE 300 UNIT/ML ~~LOC~~ SOPN: 40.0000 [IU] | PEN_INJECTOR | Freq: Two times a day (BID) | SUBCUTANEOUS | Status: DC

## 2017-05-14 MED ORDER — ALBUTEROL SULFATE (2.5 MG/3ML) 0.083% IN NEBU: 2.5000 mg | INHALATION_SOLUTION | Freq: Four times a day (QID) | RESPIRATORY_TRACT | Status: DC | PRN

## 2017-05-14 MED ORDER — DOCUSATE SODIUM 100 MG PO CAPS
100.0000 mg | ORAL_CAPSULE | Freq: Two times a day (BID) | ORAL | Status: DC | PRN
  Administered 2017-05-16: 100 mg via ORAL
  Filled 2017-05-14: qty 1

## 2017-05-14 MED ORDER — VANCOMYCIN HCL 10 G IV SOLR
1250.0000 mg | INTRAVENOUS | Status: DC
  Administered 2017-05-14 – 2017-05-15 (×2): 1250 mg via INTRAVENOUS
  Filled 2017-05-14 (×3): qty 1250

## 2017-05-14 MED ORDER — NITROGLYCERIN 0.4 MG SL SUBL: 0.4000 mg | SUBLINGUAL_TABLET | SUBLINGUAL | Status: DC | PRN

## 2017-05-14 MED ORDER — ALBUTEROL SULFATE HFA 108 (90 BASE) MCG/ACT IN AERS: 2.0000 | INHALATION_SPRAY | Freq: Four times a day (QID) | RESPIRATORY_TRACT | Status: DC | PRN

## 2017-05-14 NOTE — ED Notes (Signed)
ED Provider at bedside. 

## 2017-05-14 NOTE — ED Notes (Signed)
Admission MD at bedside.  

## 2017-05-14 NOTE — ED Triage Notes (Signed)
Pt c/o subjective fever and chills since last night.  Pt has current broken right ankle in cast.  Ankle pain has not changes.  No other symptoms other than reported fever and chills.  No cough.  Did not check temperature at home.  No urinary sx.

## 2017-05-14 NOTE — ED Notes (Signed)
RN called to attempt US IV.  Patient verbalized understanding of his IV being infiltrated.  Warm compress placed over initial infiltration site.

## 2017-05-14 NOTE — H&P (Signed)
[employer] [facility] [place] at [facility]   PATIENT NAME: [name]    MR#:  [id number]  DATE OF BIRTH:  [birth year]  DATE OF ADMISSION:  05/06/2017  PRIMARY CARE PHYSICIAN: Rayetta Humphrey, MD   REQUESTING/REFERRING PHYSICIAN:  williams  CHIEF COMPLAINT:   Chief Complaint  Patient presents with  . Fever    HISTORY OF PRESENT ILLNESS: [name]  is a 51 y.o. male with a known history of Coronary artery disease, chronic kidney disease, congestive heart failure, diabetes, gout, hyperlipidemia, hypertension, neuropathy, sleep apnea on CPAP, thyroid disease- has amputation on his left side leg and his right foot got some injury a few weeks ago and he was noted to have charcoal joint when he was given a cast for immobilization of his ankle. In last 1 month he had 2 times admissions to [facility] for this and at that time he was started on antibiotics one or 2 days but then stopped saying that he doesn't have any infections. He also had some skin breakdown and also on his lateral ankle on the right side where the podiatry did make a small opening in his cast to relieve the pressure on the ankle. For last 1-2 days he started having chills and feeling weak and excessive sweating. Concerned with this he decided to come to emergency room today. Noted to have elevated white blood cell count. X-ray in the emergency room confirms the soft tissue changes but no osteomyelitis. An noted to have significant yellowish superficial discoloration over his ulcer. ER region started on broad-spectrum antibiotic and given to hospitalist team. He also asked the patient to be transferred to [facility] as he had repeated care over there. But patient refused to go there as his family lives nearby and this would be more convenient for them.  PAST MEDICAL HISTORY:   Past Medical History:  Diagnosis Date  . Allergic rhinitis   . Anxiety    HX PANIC ATTACK  . CAD (coronary artery disease)    . Chronic kidney disease (CKD), stage IV (severe) (HCC)   . Complication of anesthesia    TROUBLE  WITH WAKING UP. SURGERY AMPUTATION AT [facility]  . Congestive heart failure (HCC)   . Diabetes mellitus type II   . GERD (gastroesophageal reflux disease)   . Gout   . History of nephrolithiasis   . Hyperlipidemia   . Hypertension   . Hypothyroidism   . Low back pain   . Myocardial infarction (HCC)   . Neuropathy   . Sleep apnea    CPAP   . Thyroid disease     PAST SURGICAL HISTORY: Past Surgical History:  Procedure Laterality Date  . 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 CORONARY ANGIOPLASTY WITH STENT PLACEMENT     2008  . GAS/FLUID EXCHANGE Right 02/10/2017   Procedure: GAS/FLUID EXCHANGE;  Surgeon: Sherrie [name], MD;  Location: [facility] OR;  Service: Ophthalmology;  Laterality: Right;  . LASER PHOTO ABLATION Right 02/10/2017   Procedure: LASER PHOTO ABLATION;  Surgeon: Sherrie [name], MD;  Location: [facility] OR;  Service: Ophthalmology;  Laterality: Right;  . MEMBRANE PEEL Right 02/10/2017   Procedure: MEMBRANE PEEL;  Surgeon: Sherrie [name], MD;  Location: [facility] OR;  Service: Ophthalmology;  Laterality: Right;  . PARS PLANA VITRECTOMY 27 GAUGE WITH 25 GAUGE PORT Right 02/10/2017   Procedure: PARS PLANA VITRECTOMY 27 GAUGE WITH 27 GAUGE PORT;  Surgeon: Sherrie [name], MD;  Location: [facility] OR;  Service: Ophthalmology;  Laterality: Right;  . REFRACTIVE SURGERY     RIGHT EYE  . REPAIR OF COMPLEX TRACTION RETINAL DETACHMENT Right 02/10/2017   Procedure: REPAIR OF COMPLEX TRACTION RETINAL DETACHMENT;  Surgeon: Sherrie [name], MD;  Location: [facility] OR;  Service: Ophthalmology;  Laterality: Right;  . stress cardiolite    . TRACHEOSTOMY     2012    (NUT ALLERGY-    [place] ICU)    SOCIAL HISTORY:  Social History  Substance Use Topics  . Smoking status: Never Smoker  . Smokeless tobacco: Never Used  . Alcohol use No    FAMILY HISTORY:  Family History  Problem Relation Age of Onset  . Diabetes  Father     DRUG ALLERGIES:  Allergies  Allergen Reactions  . Other Anaphylaxis    "TREE NUTS"  . Pravastatin Other (See Comments)    MUSCLE ACHES AND ELEVATED CK  . Ace Inhibitors Cough  . Iodinated Diagnostic Agents Other (See Comments)    KIDNEY "DISORDER"  . Sulfamethoxazole-Trimethoprim Other (See Comments)    Mouth ulcers  . Amitriptyline Nausea Only    REVIEW OF SYSTEMS:   CONSTITUTIONAL: No fever,Positive for fatigue or weakness. He had excessive sweating and chills. EYES: No blurred or double vision.  EARS, NOSE, AND THROAT: No tinnitus or ear pain.  RESPIRATORY: No cough, shortness of breath, wheezing or hemoptysis.  CARDIOVASCULAR: No chest pain, orthopnea, edema.  GASTROINTESTINAL: No nausea, vomiting, diarrhea or abdominal pain.  GENITOURINARY: No dysuria, hematuria.  ENDOCRINE: No polyuria, nocturia,  HEMATOLOGY: No anemia, easy bruising or bleeding SKIN: No rash or lesion. MUSCULOSKELETAL: No joint pain or arthritis.   NEUROLOGIC: No tingling, numbness, weakness.  PSYCHIATRY: No anxiety or depression.   MEDICATIONS AT HOME:  Prior to Admission medications   Medication Sig Start Date End Date Taking? Authorizing Provider  albuterol (VENTOLIN HFA) 108 (90 Base) MCG/ACT inhaler Inhale 2 puffs into the lungs every 6 (six) hours as needed. 03/07/15 01/27/18 Yes [provider]  allopurinol (ZYLOPRIM) 100 MG tablet Take 100 mg by mouth daily. 11/14/15  Yes [provider]  carvedilol (COREG) 6.25 MG tablet Take 6.25 mg by mouth every 12 (twelve) hours.   Yes [provider]  clobetasol ointment (TEMOVATE) 0.05 % Apply 1 application topically 2 (two) times daily as needed (for itchy skin).  06/18/15  Yes [provider]  clopidogrel (PLAVIX) 75 MG tablet Take 75 mg by mouth daily. 07/31/15  Yes [provider]  cyclobenzaprine (FLEXERIL) 10 MG tablet Take 1 tablet (10 mg total) by mouth 3 (three) times daily as needed. 09/12/16   Yes Darci Current, MD  doxycycline (VIBRA-TABS) 100 MG tablet Take 100 mg by mouth 2 (two) times daily.  11/14/15  Yes [provider]  fluticasone (FLONASE) 50 MCG/ACT nasal spray Place 1 spray into the nose 2 (two) times daily as needed (for allergies.).  10/04/14  Yes [provider]  gabapentin (NEURONTIN) 300 MG capsule TAKE ONE CAPSULE BY MOUTH TWICE DAILY Patient taking differently: TAKE 1 CAPSULE (300 MG) BY MOUTH THREE TIMES DAILY 01/17/13  Yes Sherlene Shams, MD  ibuprofen (ADVIL,MOTRIN) 800 MG tablet Take 800 mg by mouth every 8 (eight) hours as needed (for pain.).  03/04/13  Yes [provider]  insulin aspart (NOVOLOG FLEXPEN) 100 UNIT/ML FlexPen Inject 12 Units into the skin 4 (four) times daily as needed for high blood sugar (blood sugar greater than 200). Plus sliding scale  09/04/15  Yes  [provider]  Insulin Glargine (TOUJEO SOLOSTAR) 300 UNIT/ML SOPN Inject 40 Units into the skin 2 (two) times daily.  10/17/15  Yes [provider]  levothyroxine (SYNTHROID, LEVOTHROID) 75 MCG tablet Take 75 mcg by mouth daily before breakfast. Take on a empty stomach. 09/07/15 01/27/18 Yes [provider]  loratadine (CLARITIN) 10 MG tablet Take 10 mg by mouth daily. 03/07/15  Yes [provider]  magnesium oxide (MAG-OX) 400 MG tablet Take 400 mg by mouth daily.     Yes [provider]  metoCLOPramide (REGLAN) 10 MG tablet Take 10 mg by mouth 3 (three) times daily.   Yes [provider]  metolazone (ZAROXOLYN) 5 MG tablet Take 2.5-5 mg by mouth as directed. TAKE 0.5-1 TABLET (2.5 MG-5 MG) DAILY AS NEEDED FOR WEIGHT GAIN GREATER THAN 5 LBS (FLUID RETENTION)   Yes [provider]  mupirocin ointment (BACTROBAN) 2 % Apply 1 application topically 2 (two) times daily. Patient taking differently: Apply 1 application topically 2 (two) times daily as needed (for itchy skin/irritation).  02/01/16  Yes Vivi Barrack, DPM  nitroGLYCERIN (NITROSTAT) 0.4 MG SL tablet Place 1 tablet under the tongue every 5 (five) minutes x 3 doses as needed. 04/07/15 01/27/18 Yes [provider]  Omeprazole-Sodium Bicarbonate (ZEGERID) 20-1100 MG CAPS capsule Take 1 capsule by mouth daily. 08/30/09  Yes [provider]  oxyCODONE-acetaminophen (PERCOCET/ROXICET) 5-325 MG tablet Take 1-2 tablets by mouth every 6 (six) hours as needed (for moderate to severe pain.).  10/15/13  Yes [provider]  senna (SENNA-TIME) 8.6 MG tablet Take 1-2 tablets by mouth daily as needed (for mild to moderate constipation).  10/18/08  Yes [provider]  spironolactone (ALDACTONE) 25 MG tablet Take 25 mg by mouth daily.     Yes [provider]  torsemide (DEMADEX) 20 MG tablet Take 20 mg by mouth 2 (two) times daily.  03/17/14  Yes [provider]  vitamin C (ASCORBIC ACID) 500 MG tablet Take 500 mg by mouth daily.   Yes [provider]  zinc gluconate 50 MG tablet Take 50 mg by mouth daily.   Yes [provider]  bacitracin-polymyxin b (POLYSPORIN) ophthalmic ointment Place 1 application into the right eye 3 (three) times daily. apply to eye every 12 hours while awake Patient not taking: Reported on 11-Jun-2017 02/11/17   Sherrie [name], MD  gatifloxacin (ZYMAXID) 0.5 % SOLN Place 1 drop into the right eye 4 (four) times daily. Patient not taking: Reported on 06-11-17 02/11/17   Sherrie [name], MD  oxyCODONE-acetaminophen (PERCOCET) 7.5-325 MG tablet Take 1 tablet by mouth every 4 (four) hours as needed for severe pain. Patient not taking: Reported on 06-11-2017 04/18/17 04/18/18  Joni Reining, PA-C  prednisoLONE acetate (PRED FORTE) 1 % ophthalmic suspension Place 1 drop into the right eye 4 (four) times daily. Patient not taking: Reported on 06/11/2017 02/11/17   Sherrie [name], MD      PHYSICAL EXAMINATION:   VITAL SIGNS: Blood pressure (!) 107/52, pulse 75, temperature  99.2 F (37.3 C), temperature source Oral, resp. rate 16, height 5\' 6"  (1.676 m), weight 120.7 kg (266 lb), SpO2 97 %.  GENERAL:  51 y.o.-year-old obese patient lying in the bed with no acute distress.  EYES: Pupils equal, round, reactive to light and accommodation. No scleral icterus. Extraocular muscles intact.  HEENT: Head atraumatic, normocephalic. Oropharynx and nasopharynx clear.  NECK:  Supple, no jugular venous distention. No thyroid enlargement,  no tenderness.  LUNGS: Normal breath sounds bilaterally, no wheezing, rales,rhonchi or crepitation. No use of accessory muscles of respiration.  CARDIOVASCULAR: S1, S2 normal. No murmurs, rubs, or gallops.  ABDOMEN: Soft, nontender, nondistended. Bowel sounds present. No organomegaly or mass.  EXTREMITIES: No pedal edema, cyanosis, or clubbing. Left side below knee amputation. Right side he has a immobilizer cast in his leg and ankle. There is a opening made around his lateral malleoli, with a superficial ulcer 2-3 cm sized with yellowish ulcer base. NEUROLOGIC: Cranial nerves II through XII are intact. Muscle strength 5/5 in all extremities. Sensation intact. Gait not checked.  PSYCHIATRIC: The patient is alert and oriented x 3.  SKIN: No obvious rash, lesion, or ulcer.   LABORATORY PANEL:   CBC  Recent Labs Lab 05/06/2017 1305  WBC 15.9*  HGB 11.5*  HCT 35.0*  PLT 173  MCV 86.9  MCH 28.5  MCHC 32.8  RDW 15.9*  LYMPHSABS 0.7*  MONOABS 1.6*  EOSABS 0.1  BASOSABS 0.1   ------------------------------------------------------------------------------------------------------------------  Chemistries   Recent Labs Lab 05/12/2017 1305  NA 135  K 4.8  CL 107  CO2 19*  GLUCOSE 173*  BUN 57*  CREATININE 2.24*  CALCIUM 7.4*  AST 23  ALT 24  ALKPHOS 143*  BILITOT 0.8   ------------------------------------------------------------------------------------------------------------------ estimated creatinine clearance is 47.8 mL/min  (A) (by C-G formula based on SCr of 2.24 mg/dL (H)). ------------------------------------------------------------------------------------------------------------------ No results for input(s): TSH, T4TOTAL, T3FREE, THYROIDAB in the last 72 hours.  Invalid input(s): FREET3   Coagulation profile No results for input(s): INR, PROTIME in the last 168 hours. ------------------------------------------------------------------------------------------------------------------- No results for input(s): DDIMER in the last 72 hours. -------------------------------------------------------------------------------------------------------------------  Cardiac Enzymes No results for input(s): CKMB, TROPONINI, MYOGLOBIN in the last 168 hours.  Invalid input(s): CK ------------------------------------------------------------------------------------------------------------------ Invalid input(s): POCBNP  ---------------------------------------------------------------------------------------------------------------  Urinalysis    Component Value Date/Time   COLORURINE YELLOW 05/18/2011 1246   APPEARANCEUR CLEAR 05/18/2011 1246   LABSPEC 1.006 05/18/2011 1246   PHURINE 7.5 05/18/2011 1246   GLUCOSEU 100 (A) 05/18/2011 1246   HGBUR MODERATE (A) 05/18/2011 1246   BILIRUBINUR NEGATIVE 05/18/2011 1246   KETONESUR NEGATIVE 05/18/2011 1246   PROTEINUR NEGATIVE 05/18/2011 1246   UROBILINOGEN 1.0 05/18/2011 1246   NITRITE NEGATIVE 05/18/2011 1246   LEUKOCYTESUR MODERATE (A) 05/18/2011 1246     RADIOLOGY: Dg Ankle Complete Right  Result Date: 04/17/2017 CLINICAL DATA:  51 year old male with recent right ankle fracture in Charcot joint. Now with fever and chills. EXAM: RIGHT ANKLE - COMPLETE 3+ VIEW COMPARISON:  04/18/2017 and prior radiographs FINDINGS: Overlying fiberglass cast obscures fine detail. Severe neuropathic/Charcot type changes within the ankle and foot are again identified. A medial malleolar  fragment with 2.5 cm displacement is unchanged. Lateral positioning of the fibula and tibia in relation to the sclerotic and fragmented talus again noted. Vascular calcifications again noted. No definite new abnormality noted. IMPRESSION: No definite acute abnormality. Unchanged appearance of the right ankle with severe Charcot type changes and displaced medial malleolar fragment. Electronically Signed   By: Harmon Pier M.D.   On: 04/24/2017 15:27   Dg Foot 2 Views Right  Result Date: 04/28/2017 CLINICAL DATA:  Recent right ankle fracture and wound in severe Charcot joint. Now with fever and chills. EXAM: RIGHT FOOT - 2 VIEW COMPARISON:  04/18/2017 and prior radiographs FINDINGS: Fiberglass cast obscures fine detail. Severe neuropathic/ Charcot type changes within the ankle and hindfoot/midfoot again noted. Lateral subluxation of the tibia and fibula again noted. Soft tissue  swelling is present. No definite new abnormality noted. Vascular calcifications are present. IMPRESSION: Soft tissue swelling without other new abnormalities since the prior study. Unchanged severe neuropathic/ Charcot type changes within the ankle and hindfoot/midfoot. Electronically Signed   By: Harmon PierJeffrey  Hu M.D.   On: 05/07/2017 15:30    EKG: Orders placed or performed during the hospital encounter of 02/05/17  . EKG 12-Lead  . EKG 12-Lead    IMPRESSION AND PLAN:  * Cellulitis   We'll give IV vancomycin and cefepime for now and get MRSA screen    * Ankle fracture in Charcot joint.   [facility] But want to move his care over here. I will call her podiatry consult.  * Diabetes   We'll keep on insulin sliding scale coverage and continue his basal insulin.  * Hypertension   As he presented with infection and tachycardia I'll hold some of his antihypertensive medications.  * Congestive heart failure   Currently stable in no acute exacerbation, continue diuretics and  spironolactone.  * Coronary artery disease   Continue Plavix and carvedilol.  * Peripheral neuropathy   Continue muscle relaxers and gabapentin.  * Chronic kidney disease stage IV   Continue monitoring renal function while on antibiotic, appears to be stable at baseline.  All the records are reviewed and case discussed with ED provider. Management plans discussed with the patient, family and they are in agreement.  CODE STATUS: Full code. Code Status History    Date Active Date Inactive Code Status Order ID Comments User Context   02/10/2017  3:44 PM 02/11/2017  1:55 PM Full Code [id number]  Sherrie GeorgeMatthews, John D, MD Inpatient     Patient's wife and daughter were present in the room during my visit.  TOTAL TIME TAKING CARE OF THIS PATIENT: 45 minutes.    Altamese DillingVACHHANI, Emmali Karow M.D on 05/13/2017   Between 7am to 6pm - Pager - [phone]  After 6pm go to [email or web address] - password [email or web address]  [facility]  Office  [phone]  CC: Primary care physician; Rayetta HumphreyGeorge, Sionne A, MD   Note: This dictation was prepared with Dragon dictation along with smaller phrase technology. Any transcriptional errors that result from this process are unintentional.

## 2017-05-14 NOTE — ED Provider Notes (Signed)
[facility] Emergency Department Provider Note       Time seen: ----------------------------------------- 3:47 PM on 04/29/17 -----------------------------------------     I have reviewed the triage vital signs and the nursing notes.   HISTORY   Chief Complaint Fever    HPI [name] is a 51 y.o. male who presents to the ED for fever and shaking chills since last night. Patient states he currently has a broken right ankle which is in a cast. Pain has not changed at all. He has not had any other symptoms of fevers and chills. He does have an ulceration around the right ankle which has not been bothering him. Patient did not check his temperature at home, he denies congestion, cough, chest pain, shortness of breath, vomiting or diarrhea.   Past Medical History:  Diagnosis Date  . Allergic rhinitis   . Anxiety    HX PANIC ATTACK  . CAD (coronary artery disease)   . Chronic kidney disease (CKD), stage IV (severe) (HCC)   . Complication of anesthesia    TROUBLE  WITH WAKING UP. SURGERY AMPUTATION AT [facility]  . Congestive heart failure (HCC)   . Diabetes mellitus type II   . GERD (gastroesophageal reflux disease)   . Gout   . History of nephrolithiasis   . Hyperlipidemia   . Hypertension   . Hypothyroidism   . Low back pain   . Myocardial infarction (HCC)   . Neuropathy   . Sleep apnea    CPAP   . Thyroid disease     Patient Active Problem List   Diagnosis Date Noted  . 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 Controlled type 2 diabetes with leg or foot ulcer 05/29/2016  . STAPHYLOCOCCAL INFECTION 11/15/2007  . DIABETES MELLITUS, TYPE II 11/15/2007  . HYPERLIPIDEMIA 11/15/2007  . HYPERTENSION 11/15/2007  . CORONARY ARTERY DISEASE 11/15/2007  . CONGESTIVE HEART FAILURE 11/15/2007  . ALLERGIC RHINITIS 11/15/2007  . GERD 11/15/2007  . LOW BACK PAIN 11/15/2007  . VIRAL MENINGITIS, HX OF  11/15/2007  . NEPHROLITHIASIS, HX OF 11/15/2007    Past Surgical History:  Procedure Laterality Date  . 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 CORONARY ANGIOPLASTY WITH STENT PLACEMENT     2008  . GAS/FLUID EXCHANGE Right 02/10/2017   Procedure: GAS/FLUID EXCHANGE;  Surgeon: Sherrie GeorgeJohn D Matthews, MD;  [facility];  Service: Ophthalmology;  Laterality: Right;  . LASER PHOTO ABLATION Right 02/10/2017   Procedure: LASER PHOTO ABLATION;  Surgeon: Sherrie GeorgeJohn D Matthews, MD;  [facility];  Service: Ophthalmology;  Laterality: Right;  . MEMBRANE PEEL Right 02/10/2017   Procedure: MEMBRANE PEEL;  Surgeon: Sherrie GeorgeJohn D Matthews, MD;  [facility];  Service: Ophthalmology;  Laterality: Right;  . PARS PLANA VITRECTOMY 27 GAUGE WITH 25 GAUGE PORT Right 02/10/2017   Procedure: PARS PLANA VITRECTOMY 27 GAUGE WITH 27 GAUGE PORT;  Surgeon: Sherrie GeorgeJohn D Matthews, MD;  [facility];  Service: Ophthalmology;  Laterality: Right;  . REFRACTIVE SURGERY     RIGHT EYE  . REPAIR OF COMPLEX TRACTION RETINAL DETACHMENT Right 02/10/2017   Procedure: REPAIR OF COMPLEX TRACTION RETINAL DETACHMENT;  Surgeon: Sherrie GeorgeJohn D Matthews, MD;  [facility];  Service: Ophthalmology;  Laterality: Right;  . stress cardiolite    . TRACHEOSTOMY     2012    (NUT ALLERGY-    [facility] ICU)    Allergies Other; Pravastatin; Ace inhibitors; Iodinated diagnostic agents;  Sulfamethoxazole-trimethoprim; and Amitriptyline  Social History Social History  Substance Use Topics  . Smoking status: Never Smoker  . Smokeless tobacco: Never Used  . Alcohol use No    Review of Systems Constitutional: Positive for fever and chills Eyes: Negative for vision changes ENT:  Negative for congestion, sore throat Cardiovascular: Negative for chest pain. Respiratory: Negative for shortness of breath. Gastrointestinal: Negative for abdominal pain, vomiting and diarrhea. Genitourinary: Negative for dysuria. Musculoskeletal: Positive for subacute right ankle  pain Skin: Negative for rash. Neurological: Negative for headaches, focal weakness or numbness.  All systems negative/normal/unremarkable except as stated in the HPI  ____________________________________________   PHYSICAL EXAM:  VITAL SIGNS: ED Triage Vitals  Enc Vitals Group     BP 05/11/2017 1306 (!) 107/52     Pulse Rate 05/11/2017 1248 75     Resp 04/21/2017 1248 16     Temp 05/13/2017 1248 99.2 F (37.3 C)     Temp Source 05/11/2017 1248 Oral     SpO2 05/01/2017 1248 97 %     Weight 04/29/2017 1248 266 lb (120.7 kg)     Height 04/17/2017 1248 5\' 6"  (1.676 m)     Head Circumference --      Peak Flow --      Pain Score 05/01/2017 1248 10     Pain Loc --      Pain Edu? --      Excl. in GC? --    Constitutional: Alert and oriented. Mild distress Eyes: Conjunctivae are normal. Normal extraocular movements. ENT   Head: Normocephalic and atraumatic.   Nose: No congestion/rhinnorhea.   Mouth/Throat: Mucous membranes are moist.   Neck: No stridor. Cardiovascular: Normal rate, regular rhythm. No murmurs, rubs, or gallops. Respiratory: Normal respiratory effort without tachypnea nor retractions. Breath sounds are clear and equal bilaterally. No wheezes/rales/rhonchi. Gastrointestinal: Soft and nontender. Normal bowel sounds Musculoskeletal: Right lower extremity is in a cast, there has been removal of the cast around the lateral malleolus with skin ulceration present. There is a foul smell from this wound. Left sided amputation Neurologic:  Normal speech and language. No gross focal neurologic deficits are appreciated.  Skin:  Skin is warm, dry and intact. No rash noted. Psychiatric: Mood and affect are normal. Speech and behavior are normal.  ____________________________________________  ED COURSE:  Pertinent labs & imaging results that were available during my care of the patient were reviewed by me and considered in my medical decision making (see chart for details). Patient  presents for fever and chills concerning for systemic infection, we will assess with labs and imaging as indicated.   Procedures ____________________________________________   LABS (pertinent positives/negatives)  Labs Reviewed  COMPREHENSIVE METABOLIC PANEL - Abnormal; Notable for the following:       Result Value   CO2 19 (*)    Glucose, Bld 173 (*)    BUN 57 (*)    Creatinine, Ser 2.24 (*)    Calcium 7.4 (*)    Albumin 3.4 (*)    Alkaline Phosphatase 143 (*)    GFR calc non Af Amer 32 (*)    GFR calc Af Amer 37 (*)    All other components within normal limits  CBC WITH DIFFERENTIAL/PLATELET - Abnormal; Notable for the following:    WBC 15.9 (*)    RBC 4.03 (*)    Hemoglobin 11.5 (*)    HCT 35.0 (*)    RDW 15.9 (*)    Neutro Abs 13.5 (*)    Lymphs  Abs 0.7 (*)    Monocytes Absolute 1.6 (*)    All other components within normal limits  SEDIMENTATION RATE - Abnormal; Notable for the following:    Sed Rate 54 (*)    All other components within normal limits  CULTURE, BLOOD (ROUTINE X 2)  CULTURE, BLOOD (ROUTINE X 2)  AEROBIC/ANAEROBIC CULTURE (SURGICAL/DEEP WOUND)  URINALYSIS, COMPLETE (UACMP) WITH MICROSCOPIC  LACTIC ACID, PLASMA  LACTIC ACID, PLASMA   CRITICAL CARE Performed by: Emily Filbert   Total critical care time: 30 minutes  Critical care time was exclusive of separately billable procedures and treating other patients.  Critical care was necessary to treat or prevent imminent or life-threatening deterioration.  Critical care was time spent personally by me on the following activities: development of treatment plan with patient and/or surrogate as well as nursing, discussions with consultants, evaluation of patient's response to treatment, examination of patient, obtaining history from patient or surrogate, ordering and performing treatments and interventions, ordering and review of laboratory studies, ordering and review of radiographic studies, pulse  oximetry and re-evaluation of patient's condition.  RADIOLOGY Images were viewed by me Foot, ankle x-rays IMPRESSION: Soft tissue swelling without other new abnormalities since the prior study.  Unchanged severe neuropathic/ Charcot type changes within the ankle and hindfoot/midfoot.  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 Patient had presented for fever and shaking chills concerning for systemic infection. The only source I can identify at this time is a right ankle ulceration. We have ordered broad-spectrum antibiotics and he will require admission with possible MRI of the foot and ankle.   Emily Filbert, MD   Note: This note was generated in part or whole with voice recognition software. Voice recognition is usually quite accurate but there are transcription errors that can and very often do occur. I apologize for any typographical errors that were not detected and corrected.     Emily Filbert, MD 05/05/2017 [phone]

## 2017-05-14 NOTE — Progress Notes (Signed)
Pharmacy Antibiotic Note  [name] is a 51 y.o. male admitted on 05/09/2017 with wound infection.  Pharmacy has been consulted for cefepime and vancomycin dosing.  Plan: Cefepime 2 g IV q12h  Vancomycin 1000 mg dose given in ED.  Will order vancomycin 1250 mg IV q24h (6 hour stack) Goal VT 15-20 mcg/mL  Kinetics: Adjusted body weight = 86 kg, CrCl = 48 mL/min Ke: 0.044 Half-life: 15 hrs Vd: 60 L Cmin = 15 mcg/mL (estimated) Patient is at risk for accumulation due to obesity.  Height: 5\' 6"  (167.6 cm) Weight: 266 lb (120.7 kg) IBW/kg (Calculated) : 63.8  Temp (24hrs), Avg:99.2 F (37.3 C), Min:99.2 F (37.3 C), Max:99.2 F (37.3 C)   Recent Labs Lab May 01, 2017 1305 May 01, 2017 1437  WBC 15.9*  --   CREATININE 2.24*  --   LATICACIDVEN  --  1.0    Estimated Creatinine Clearance: 47.8 mL/min (A) (by C-G formula based on SCr of 2.24 mg/dL (H)).    Allergies  Allergen Reactions  . Other Anaphylaxis    "TREE NUTS"  . Pravastatin Other (See Comments)    MUSCLE ACHES AND ELEVATED CK  . Ace Inhibitors Cough  . Iodinated Diagnostic Agents Other (See Comments)    KIDNEY "DISORDER"  . Sulfamethoxazole-Trimethoprim Other (See Comments)    Mouth ulcers  . Amitriptyline Nausea Only    Antimicrobials this admission: vancomycin 6/28 >>  cefepime 6/28 >>   Dose adjustments this admission:  Microbiology results: 6/28 BCx: Sent 6/28 UCx: Sent  6/28 MRSA PCR: Sent  Thank you for allowing pharmacy to be a part of this patient's care.  Cindi CarbonMary M Hrishikesh Hoeg, PharmD Clinical Pharmacist 05/09/2017 5:29 PM

## 2017-05-15 LAB — GLUCOSE, CAPILLARY
GLUCOSE-CAPILLARY: 101 mg/dL — AB (ref 65–99)
GLUCOSE-CAPILLARY: 112 mg/dL — AB (ref 65–99)
Glucose-Capillary: 102 mg/dL — ABNORMAL HIGH (ref 65–99)
Glucose-Capillary: 113 mg/dL — ABNORMAL HIGH (ref 65–99)
Glucose-Capillary: 123 mg/dL — ABNORMAL HIGH (ref 65–99)
Glucose-Capillary: 172 mg/dL — ABNORMAL HIGH (ref 65–99)

## 2017-05-15 LAB — CBC
HCT: 36.5 % — ABNORMAL LOW (ref 40.0–52.0)
HEMOGLOBIN: 11.9 g/dL — AB (ref 13.0–18.0)
MCH: 29 pg (ref 26.0–34.0)
MCHC: 32.6 g/dL (ref 32.0–36.0)
MCV: 88.9 fL (ref 80.0–100.0)
Platelets: 160 10*3/uL (ref 150–440)
RBC: 4.11 MIL/uL — AB (ref 4.40–5.90)
RDW: 16.3 % — ABNORMAL HIGH (ref 11.5–14.5)
WBC: 12.2 10*3/uL — ABNORMAL HIGH (ref 3.8–10.6)

## 2017-05-15 LAB — URINALYSIS, COMPLETE (UACMP) WITH MICROSCOPIC
BILIRUBIN URINE: NEGATIVE
Bacteria, UA: NONE SEEN
Glucose, UA: NEGATIVE mg/dL
Hgb urine dipstick: NEGATIVE
Ketones, ur: NEGATIVE mg/dL
LEUKOCYTES UA: NEGATIVE
NITRITE: NEGATIVE
Protein, ur: NEGATIVE mg/dL
SPECIFIC GRAVITY, URINE: 1.015 (ref 1.005–1.030)
WBC UA: NONE SEEN WBC/hpf (ref 0–5)
pH: 5 (ref 5.0–8.0)

## 2017-05-15 LAB — BASIC METABOLIC PANEL
ANION GAP: 11 (ref 5–15)
BUN: 59 mg/dL — ABNORMAL HIGH (ref 6–20)
CALCIUM: 7.7 mg/dL — AB (ref 8.9–10.3)
CHLORIDE: 106 mmol/L (ref 101–111)
CO2: 19 mmol/L — AB (ref 22–32)
CREATININE: 2.24 mg/dL — AB (ref 0.61–1.24)
GFR calc non Af Amer: 32 mL/min — ABNORMAL LOW (ref >60)
GFR, EST AFRICAN AMERICAN: 37 mL/min — AB (ref >60)
GLUCOSE: 98 mg/dL (ref 65–99)
Potassium: 5 mmol/L (ref 3.5–5.1)
Sodium: 136 mmol/L (ref 135–145)

## 2017-05-15 MED ORDER — ALPRAZOLAM 0.25 MG PO TABS
0.2500 mg | ORAL_TABLET | Freq: Three times a day (TID) | ORAL | Status: DC | PRN
  Administered 2017-05-15 – 2017-05-17 (×3): 0.25 mg via ORAL
  Filled 2017-05-15 (×3): qty 1

## 2017-05-15 MED ORDER — OXYCODONE HCL 5 MG PO TABS
10.0000 mg | ORAL_TABLET | ORAL | Status: DC | PRN
  Administered 2017-05-15 – 2017-05-18 (×12): 10 mg via ORAL
  Filled 2017-05-15 (×14): qty 2

## 2017-05-15 MED ORDER — ZINC SULFATE 220 (50 ZN) MG PO CAPS
220.0000 mg | ORAL_CAPSULE | Freq: Every day | ORAL | Status: DC
  Administered 2017-05-15 – 2017-05-18 (×4): 220 mg via ORAL
  Filled 2017-05-15 (×5): qty 1

## 2017-05-15 MED ORDER — MORPHINE SULFATE (PF) 4 MG/ML IV SOLN
4.0000 mg | INTRAVENOUS | Status: DC | PRN
  Administered 2017-05-15 – 2017-05-17 (×11): 4 mg via INTRAVENOUS
  Filled 2017-05-15 (×12): qty 1

## 2017-05-15 NOTE — Care Management Important Message (Signed)
Important Message  Patient Details  Name: Idamae SchullerDarren A Stoney MRN: [id number] Date of Birth: 01-01-1966   Medicare Important Message Given:  Yes    Marily MemosLisa M Terriona Horlacher, RN 05/15/2017, 12:05 PM

## 2017-05-15 NOTE — Consult Note (Signed)
ORTHOPAEDIC CONSULTATION  REQUESTING PHYSICIAN: Katharina Caper, MD  Chief Complaint: Right ankle ulcer  HPI: [name] is a 51 y.o. male who complains of  Recent fever and chills.  Recent charcot fracture to right ankle with ulcer.  Recently casted and to f/u at [facility] next weekt.  Developed recent f/c and admitted to hospital.  Longstanding hx of dm with neuropathy.  S/P right bka.  Past Medical History:  Diagnosis Date  . Allergic rhinitis   . Anxiety    HX PANIC ATTACK  . CAD (coronary artery disease)   . Chronic kidney disease (CKD), stage IV (severe) (HCC)   . Complication of anesthesia    TROUBLE  WITH WAKING UP. SURGERY AMPUTATION AT [facility]  . Congestive heart failure (HCC)   . Diabetes mellitus type II   . GERD (gastroesophageal reflux disease)   . Gout   . History of nephrolithiasis   . Hyperlipidemia   . Hypertension   . Hypothyroidism   . Low back pain   . Myocardial infarction (HCC)   . Neuropathy   . Sleep apnea    CPAP   . Thyroid disease    Past Surgical History:  Procedure Laterality Date  . 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 CORONARY ANGIOPLASTY WITH STENT PLACEMENT     2008  . GAS/FLUID EXCHANGE Right 02/10/2017   Procedure: GAS/FLUID EXCHANGE;  Surgeon: Sherrie [name], MD;  Location: [facility] OR;  Service: Ophthalmology;  Laterality: Right;  . LASER PHOTO ABLATION Right 02/10/2017   Procedure: LASER PHOTO ABLATION;  Surgeon: Sherrie [name], MD;  Location: [facility] OR;  Service: Ophthalmology;  Laterality: Right;  . MEMBRANE PEEL Right 02/10/2017   Procedure: MEMBRANE PEEL;  Surgeon: Sherrie [name], MD;  Location: [facility] OR;  Service: Ophthalmology;  Laterality: Right;  . PARS PLANA VITRECTOMY 27 GAUGE WITH 25 GAUGE PORT Right 02/10/2017   Procedure: PARS PLANA VITRECTOMY 27 GAUGE WITH 27 GAUGE PORT;  Surgeon: Sherrie [name], MD;  Location: [facility] OR;  Service: Ophthalmology;  Laterality: Right;  . REFRACTIVE SURGERY     RIGHT EYE  . REPAIR OF COMPLEX TRACTION  RETINAL DETACHMENT Right 02/10/2017   Procedure: REPAIR OF COMPLEX TRACTION RETINAL DETACHMENT;  Surgeon: Sherrie [name], MD;  Location: [facility] OR;  Service: Ophthalmology;  Laterality: Right;  . stress cardiolite    . TRACHEOSTOMY     2012    (NUT ALLERGY-    [facility] ICU)   Social History   Social History  . Marital status: Married    Spouse name: N/A  . Number of children: N/A  . Years of education: N/A   Social History Main Topics  . Smoking status: Never Smoker  . Smokeless tobacco: Never Used  . Alcohol use No  . Drug use: No  . Sexual activity: Not Asked   Other Topics Concern  . None   Social History Narrative  . None   Family History  Problem Relation Age of Onset  . Diabetes Father    Allergies  Allergen Reactions  . Other Anaphylaxis    "TREE NUTS"  . Pravastatin Other (See Comments)    MUSCLE ACHES AND ELEVATED CK  . Ace Inhibitors Cough  . Iodinated Diagnostic Agents Other (See Comments)    KIDNEY "DISORDER"  . Sulfamethoxazole-Trimethoprim Other (See Comments)    Mouth ulcers  . Allevyn Adhesive [Wound Dressings] Dermatitis    Pt states this adhesive causes severe blisters. Paper tape and band-aids ok.  Marland Kitchen  Amitriptyline Nausea Only   Prior to Admission medications   Medication Sig Start Date End Date Taking? Authorizing Provider  albuterol (VENTOLIN HFA) 108 (90 Base) MCG/ACT inhaler Inhale 2 puffs into the lungs every 6 (six) hours as needed. 03/07/15 01/27/18 Yes [provider]  allopurinol (ZYLOPRIM) 100 MG tablet Take 100 mg by mouth daily. 11/14/15  Yes [provider]  carvedilol (COREG) 6.25 MG tablet Take 6.25 mg by mouth every 12 (twelve) hours.   Yes [provider]  clobetasol ointment (TEMOVATE) 0.05 % Apply 1 application topically 2 (two) times daily as needed (for itchy skin).  06/18/15  Yes [provider]  clopidogrel (PLAVIX) 75 MG tablet Take 75 mg by mouth daily. 07/31/15  Yes [provider]   cyclobenzaprine (FLEXERIL) 10 MG tablet Take 1 tablet (10 mg total) by mouth 3 (three) times daily as needed. 09/12/16  Yes Darci CurrentBrown, [place] N, MD  doxycycline (VIBRA-TABS) 100 MG tablet Take 100 mg by mouth 2 (two) times daily.  11/14/15  Yes [provider]  fluticasone (FLONASE) 50 MCG/ACT nasal spray Place 1 spray into the nose 2 (two) times daily as needed (for allergies.).  10/04/14  Yes [provider]  gabapentin (NEURONTIN) 300 MG capsule TAKE ONE CAPSULE BY MOUTH TWICE DAILY Patient taking differently: TAKE 1 CAPSULE (300 MG) BY MOUTH THREE TIMES DAILY 01/17/13  Yes Sherlene Shamsullo, Teresa L, MD  ibuprofen (ADVIL,MOTRIN) 800 MG tablet Take 800 mg by mouth every 8 (eight) hours as needed (for pain.).  03/04/13  Yes [provider]  insulin aspart (NOVOLOG FLEXPEN) 100 UNIT/ML FlexPen Inject 12 Units into the skin 4 (four) times daily as needed for high blood sugar (blood sugar greater than 200). Plus sliding scale  09/04/15  Yes [provider]  Insulin Glargine (TOUJEO SOLOSTAR) 300 UNIT/ML SOPN Inject 40 Units into the skin 2 (two) times daily.  10/17/15  Yes [provider]  levothyroxine (SYNTHROID, LEVOTHROID) 75 MCG tablet Take 75 mcg by mouth daily before breakfast. Take on a empty stomach. 09/07/15 01/27/18 Yes [provider]  loratadine (CLARITIN) 10 MG tablet Take 10 mg by mouth daily. 03/07/15  Yes [provider]  magnesium oxide (MAG-OX) 400 MG tablet Take 400 mg by mouth daily.     Yes [provider]  metoCLOPramide (REGLAN) 10 MG tablet Take 10 mg by mouth 3 (three) times daily.   Yes [provider]  metolazone (ZAROXOLYN) 5 MG tablet Take 2.5-5 mg by mouth as directed. TAKE 0.5-1 TABLET (2.5 MG-5 MG) DAILY AS NEEDED FOR WEIGHT GAIN GREATER THAN 5 LBS (FLUID RETENTION)   Yes [provider]  mupirocin ointment (BACTROBAN) 2 % Apply 1 application topically 2 (two) times daily. Patient taking  differently: Apply 1 application topically 2 (two) times daily as needed (for itchy skin/irritation).  02/01/16  Yes Vivi BarrackWagoner, Matthew R, DPM  nitroGLYCERIN (NITROSTAT) 0.4 MG SL tablet Place 1 tablet under the tongue every 5 (five) minutes x 3 doses as needed. 04/07/15 01/27/18 Yes [provider]  Omeprazole-Sodium Bicarbonate (ZEGERID) 20-1100 MG CAPS capsule Take 1 capsule by mouth daily. 08/30/09  Yes [provider]  oxyCODONE-acetaminophen (PERCOCET/ROXICET) 5-325 MG tablet Take 1-2 tablets by mouth every 6 (six) hours as needed (for moderate to severe pain.).  10/15/13  Yes [provider]  senna (SENNA-TIME) 8.6 MG tablet Take 1-2 tablets by mouth daily as needed (for mild to moderate constipation).  10/18/08  Yes [provider]  spironolactone (ALDACTONE) 25  MG tablet Take 25 mg by mouth daily.     Yes [provider]  torsemide (DEMADEX) 20 MG tablet Take 20 mg by mouth 2 (two) times daily.  03/17/14  Yes [provider]  vitamin C (ASCORBIC ACID) 500 MG tablet Take 500 mg by mouth daily.   Yes [provider]  zinc gluconate 50 MG tablet Take 50 mg by mouth daily.   Yes [provider]  bacitracin-polymyxin b (POLYSPORIN) ophthalmic ointment Place 1 application into the right eye 3 (three) times daily. apply to eye every 12 hours while awake Patient not taking: Reported on 05/02/2017 02/11/17   Sherrie [name], MD  gatifloxacin (ZYMAXID) 0.5 % SOLN Place 1 drop into the right eye 4 (four) times daily. Patient not taking: Reported on 04/20/2017 02/11/17   Sherrie [name], MD  oxyCODONE-acetaminophen (PERCOCET) 7.5-325 MG tablet Take 1 tablet by mouth every 4 (four) hours as needed for severe pain. Patient not taking: Reported on 04/18/2017 04/18/17 04/18/18  Joni Reining, PA-C  prednisoLONE acetate (PRED FORTE) 1 % ophthalmic suspension Place 1 drop into the right eye 4 (four) times daily. Patient not taking: Reported on  05/01/2017 02/11/17   Sherrie [name], MD   Dg Ankle Complete Right  Result Date: 04/20/2017 CLINICAL DATA:  51 year old male with recent right ankle fracture in Charcot joint. Now with fever and chills. EXAM: RIGHT ANKLE - COMPLETE 3+ VIEW COMPARISON:  04/18/2017 and prior radiographs FINDINGS: Overlying fiberglass cast obscures fine detail. Severe neuropathic/Charcot type changes within the ankle and foot are again identified. A medial malleolar fragment with 2.5 cm displacement is unchanged. Lateral positioning of the fibula and tibia in relation to the sclerotic and fragmented talus again noted. Vascular calcifications again noted. No definite new abnormality noted. IMPRESSION: No definite acute abnormality. Unchanged appearance of the right ankle with severe Charcot type changes and displaced medial malleolar fragment. Electronically Signed   By: Harmon Pier M.D.   On: 04/20/2017 15:27   Dg Foot 2 Views Right  Result Date: 04/22/2017 CLINICAL DATA:  Recent right ankle fracture and wound in severe Charcot joint. Now with fever and chills. EXAM: RIGHT FOOT - 2 VIEW COMPARISON:  04/18/2017 and prior radiographs FINDINGS: Fiberglass cast obscures fine detail. Severe neuropathic/ Charcot type changes within the ankle and hindfoot/midfoot again noted. Lateral subluxation of the tibia and fibula again noted. Soft tissue swelling is present. No definite new abnormality noted. Vascular calcifications are present. IMPRESSION: Soft tissue swelling without other new abnormalities since the prior study. Unchanged severe neuropathic/ Charcot type changes within the ankle and hindfoot/midfoot. Electronically Signed   By: Harmon Pier M.D.   On: 05/10/2017 15:30    Positive ROS: All other systems have been reviewed and were otherwise negative with the exception of those mentioned in the HPI and as above.  12 point ROS was performed.  Physical Exam: General: Alert and oriented.  No apparent  distress.  Vascular:  Left foot:BKA  Right foot: Pt with cast  Neuro:absent protective sensation  Derm:Lateral ankle ulcer right ankle seen through window of cast.  No purulence but probes to fibula. 4cm ulcer.  Fibrotic tissue.  Ortho/MS: Left bka Right cast   Assessment: Lateral ankle ulcer probes deep to ankle/fibula  Plan: Deep wound culture obtained. Concern for osteomyelitis.  May need resection of fibula but has unstable ankle 2ndary to charcot ankle fracture. Recommend antibiotic and medical stabilization and return to [facility] for definitive procedure as may need resection  of fibula with external fixation. Culture taken today.    Irean Hong, DPM Cell [phone]   05/15/2017 8:29 AM

## 2017-05-15 NOTE — Progress Notes (Signed)
[facility] - [place] at [facility]   PATIENT NAME: [name]    MR#:  [id number]  DATE OF BIRTH:  [birth year]  SUBJECTIVE:  CHIEF COMPLAINT:   Chief Complaint  Patient presents with  . 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 On arrival he was noted to have leukocytosis and was initiated on broad-spectrum antibiotic therapy due to concerns of a wound infection and possibly even osteomyelitis. Podiatry consultation was requested. Patient was seen by Dr. Ether Griffins, who obtained a deep wound culture, he was concerned about osteomyelitis, and possible need of a fibular resection. He recommended antibiotic and medical stabilization and return to [facility] for definitive procedure. Patient complains of significant pain, requests more pain medications. He also admits of anxiety and wants anxiolytics  Review of Systems  Constitutional: Negative for chills, fever and weight loss.  HENT: Negative for congestion.   Eyes: Negative for blurred vision and double vision.  Respiratory: Negative for cough, sputum production, shortness of breath and wheezing.   Cardiovascular: Negative for chest pain, palpitations, orthopnea, leg swelling and PND.  Gastrointestinal: Negative for abdominal pain, blood in stool, constipation, diarrhea, nausea and vomiting.  Genitourinary: Negative for dysuria, frequency, hematuria and urgency.  Musculoskeletal: Positive for joint pain. Negative for falls.  Neurological: Negative for dizziness, tremors, focal weakness and headaches.  Endo/Heme/Allergies: Does not bruise/bleed easily.  Psychiatric/Behavioral: Negative for depression. The patient is nervous/anxious. The patient does not have insomnia.     VITAL SIGNS: Blood pressure 117/63, pulse 86,  temperature 98.7 F (37.1 C), temperature source Axillary, resp. rate 18, height 5\' 6"  (1.676 m), weight 120.7 kg (266 lb), SpO2 92 %.  PHYSICAL EXAMINATION:   GENERAL:  51 y.o.-year-old patient lying in the bed with no acute distress.  EYES: Pupils equal, round, reactive to light and accommodation. No scleral icterus. Extraocular muscles intact.  HEENT: Head atraumatic, normocephalic. Oropharynx and nasopharynx clear.  NECK:  Supple, no jugular venous distention. No thyroid enlargement, no tenderness.  LUNGS: Normal breath sounds bilaterally, no wheezing, rales,rhonchi or crepitation. No use of accessory muscles of respiration.  CARDIOVASCULAR: S1, S2 normal. No murmurs, rubs, or gallops.  ABDOMEN: Soft, nontender, nondistended. Bowel sounds present. No organomegaly or mass.  EXTREMITIES:Left-sided BKA, right foot in cast, lateral ankle ulcer of right ankle was noted through the window of cast, no purulence, no foul smell drainage, dressing is placed .  NEUROLOGIC: Cranial nerves II through XII are intact. Muscle strength 5/5 in all extremities. Sensation intact. Gait not checked.  PSYCHIATRIC: The patient is alert and oriented x 3.  SKIN: No obvious rash, lesion, or ulcer.   ORDERS/RESULTS REVIEWED:   CBC  Recent Labs Lab 05-23-17 1305 05/15/17 0629  WBC 15.9* 12.2*  HGB 11.5* 11.9*  HCT 35.0* 36.5*  PLT 173 160  MCV 86.9 88.9  MCH 28.5 29.0  MCHC 32.8 32.6  RDW 15.9* 16.3*  LYMPHSABS 0.7*  --   MONOABS 1.6*  --   EOSABS 0.1  --   BASOSABS 0.1  --    ------------------------------------------------------------------------------------------------------------------  Chemistries   Recent Labs Lab 05-23-2017 1305 05/15/17 0629  NA 135 136  K 4.8 5.0  CL 107 106  CO2 19* 19*  GLUCOSE 173* 98  BUN 57* 59*  CREATININE 2.24* 2.24*  CALCIUM  7.4* 7.7*  AST 23  --   ALT 24  --   ALKPHOS 143*  --   BILITOT 0.8  --     ------------------------------------------------------------------------------------------------------------------ estimated creatinine clearance is 47.8 mL/min (A) (by C-G formula based on SCr of 2.24 mg/dL (H)). ------------------------------------------------------------------------------------------------------------------ No results for input(s): TSH, T4TOTAL, T3FREE, THYROIDAB in the last 72 hours.  Invalid input(s): FREET3  Cardiac Enzymes No results for input(s): CKMB, TROPONINI, MYOGLOBIN in the last 168 hours.  Invalid input(s): CK ------------------------------------------------------------------------------------------------------------------ Invalid input(s): POCBNP ---------------------------------------------------------------------------------------------------------------  RADIOLOGY: Dg Ankle Complete Right  Result Date: 16-Mar-2017 CLINICAL DATA:  51 year old male with recent right ankle fracture in Charcot joint. Now with fever and chills. EXAM: RIGHT ANKLE - COMPLETE 3+ VIEW COMPARISON:  04/18/2017 and prior radiographs FINDINGS: Overlying fiberglass cast obscures fine detail. Severe neuropathic/Charcot type changes within the ankle and foot are again identified. A medial malleolar fragment with 2.5 cm displacement is unchanged. Lateral positioning of the fibula and tibia in relation to the sclerotic and fragmented talus again noted. Vascular calcifications again noted. No definite new abnormality noted. IMPRESSION: No definite acute abnormality. Unchanged appearance of the right ankle with severe Charcot type changes and displaced medial malleolar fragment. Electronically Signed   By: Harmon PierJeffrey  Hu M.D.   On: 030-Apr-2018 15:27   Dg Foot 2 Views Right  Result Date: 16-Mar-2017 CLINICAL DATA:  Recent right ankle fracture and wound in severe Charcot joint. Now with fever and chills. EXAM: RIGHT FOOT - 2 VIEW COMPARISON:  04/18/2017 and prior radiographs FINDINGS: Fiberglass cast  obscures fine detail. Severe neuropathic/ Charcot type changes within the ankle and hindfoot/midfoot again noted. Lateral subluxation of the tibia and fibula again noted. Soft tissue swelling is present. No definite new abnormality noted. Vascular calcifications are present. IMPRESSION: Soft tissue swelling without other new abnormalities since the prior study. Unchanged severe neuropathic/ Charcot type changes within the ankle and hindfoot/midfoot. Electronically Signed   By: Harmon PierJeffrey  Hu M.D.   On: 030-Apr-2018 15:30    EKG:  Orders placed or performed during the hospital encounter of 02/05/17  . EKG 12-Lead  . EKG 12-Lead    ASSESSMENT AND PLAN:  Principal Problem:   Cellulitis in diabetic foot (HCC) Active Problems:   Cellulitis  #1. Right ankle ulcer with possible osteomyelitis,  deep wound culture was obtained by Dr. Ether GriffinsFowler today, continue broad-spectrum antibiotic therapy, [facility], continue pain medications, advanced doses #2. Chronic renal insufficiency, CK D stage III.Marland Kitchen. Stable. Urinalysis was unremarkable  #3. Leukocytosis, improving with therapy  #4. Essential hypertension, well-controlled  #5. Diabetes mellitus stage II, continue diabetic diet, insulin Lantus, sliding scale NovoLog, fasting blood glucose was 112  #6. Anemia, stable  #7. Anxiety, initiate patient on Xanax, watching patient's mental status closely   Management plans discussed with the patient, family and they are in agreement.   DRUG ALLERGIES:  Allergies  Allergen Reactions  . Other Anaphylaxis    "TREE NUTS"  . Pravastatin Other (See Comments)    MUSCLE ACHES AND ELEVATED CK  . Ace Inhibitors Cough  . Iodinated Diagnostic Agents Other (See Comments)    KIDNEY "DISORDER"  . Sulfamethoxazole-Trimethoprim Other (See Comments)    Mouth ulcers  . Allevyn Adhesive [Wound Dressings] Dermatitis    Pt states this adhesive causes severe blisters. Paper tape and band-aids  ok.  . Amitriptyline Nausea Only    CODE STATUS:     Code Status Orders        Start  Ordered   04/25/2017 1843  Full code  Continuous     04/21/2017 1842    Code Status History    Date Active Date Inactive Code Status Order ID Comments User Context   02/10/2017  3:44 PM 02/11/2017  1:55 PM Full Code [id number]  Sherrie [name], MD Inpatient      TOTAL TIME TAKING CARE OF THIS PATIENT: 35  minutes.    Katharina Caper M.D on 05/15/2017 at 5:04 PM  Between 7am to 6pm - Pager - [phone]  After 6pm go to [email or web address] - password [email or web address] [facility]  [facility]  Office  [phone]  CC: Primary care physician; Rayetta Humphrey, MD

## 2017-05-16 LAB — GLUCOSE, CAPILLARY
GLUCOSE-CAPILLARY: 132 mg/dL — AB (ref 65–99)
Glucose-Capillary: 102 mg/dL — ABNORMAL HIGH (ref 65–99)
Glucose-Capillary: 97 mg/dL (ref 65–99)
Glucose-Capillary: 106 mg/dL — ABNORMAL HIGH (ref 65–99)

## 2017-05-16 LAB — URINE CULTURE: Culture: NO GROWTH

## 2017-05-16 LAB — HIV ANTIBODY (ROUTINE TESTING W REFLEX): HIV Screen 4th Generation wRfx: NONREACTIVE

## 2017-05-16 MED ORDER — ACETAMINOPHEN 325 MG PO TABS
650.0000 mg | ORAL_TABLET | Freq: Four times a day (QID) | ORAL | Status: DC | PRN
  Administered 2017-05-16 – 2017-05-17 (×4): 650 mg via ORAL
  Filled 2017-05-16 (×4): qty 2

## 2017-05-16 NOTE — Progress Notes (Signed)
Remains on room air,iv antibiotics continues,pain management in progress,wound dressing done by Dr.fowler,remains febrile,close monitoring in progress.

## 2017-05-16 NOTE — Progress Notes (Signed)
[facility] - [place] at [facility]   PATIENT NAME: Richard KyleDarren [name]    MR#:  [id number]  DATE OF BIRTH:  02/15/1966  SUBJECTIVE:   Patient here due to fever and suspected sepsis secondary to right foot/ankle ulcer with osteomyelitis. Still continues to have fever. Seen by podiatry and no plans for debridement or intervention now continue IV antibiotics and likely needs follow-up [facility].   REVIEW OF SYSTEMS:    Review of Systems  Constitutional: Positive for fever. Negative for chills.  HENT: Negative for congestion and tinnitus.   Eyes: Negative for blurred vision and double vision.  Respiratory: Negative for cough, shortness of breath and wheezing.   Cardiovascular: Negative for chest pain, orthopnea and PND.  Gastrointestinal: Negative for abdominal pain, diarrhea, nausea and vomiting.  Genitourinary: Negative for dysuria and hematuria.  Neurological: Negative for dizziness, sensory change and focal weakness.  All other systems reviewed and are negative.   Nutrition: Heart healthy/Carb modified Tolerating Diet: Yes Tolerating PT: Await Eval.   DRUG ALLERGIES:   Allergies  Allergen Reactions  . Other Anaphylaxis    "TREE NUTS"  . Pravastatin Other (See Comments)    MUSCLE ACHES AND ELEVATED CK  . Ace Inhibitors Cough  . Iodinated Diagnostic Agents Other (See Comments)    KIDNEY "DISORDER"  . Sulfamethoxazole-Trimethoprim Other (See Comments)    Mouth ulcers  . Allevyn Adhesive [Wound Dressings] Dermatitis    Pt states this adhesive causes severe blisters. Paper tape and band-aids ok.  . Amitriptyline Nausea Only    VITALS:  Blood pressure (!) 144/72, pulse 94, temperature (!) 101.6 F (38.7 C), temperature source Oral, resp. rate 20, height 5\' 6"  (1.676 m), weight 120.7 kg (266 lb), SpO2 96 %.  PHYSICAL EXAMINATION:   Physical Exam  GENERAL:  51 y.o.-year-old obese patient lying in bed in no acute distress.  EYES:  Pupils equal, round, reactive to light and accommodation. No scleral icterus. Extraocular muscles intact.  HEENT: Head atraumatic, normocephalic. Oropharynx and nasopharynx clear.  NECK:  Supple, no jugular venous distention. No thyroid enlargement, no tenderness.  LUNGS: Normal breath sounds bilaterally, no wheezing, rales, rhonchi. No use of accessory muscles of respiration.  CARDIOVASCULAR: S1, S2 normal. No murmurs, rubs, or gallops.  ABDOMEN: Soft, nontender, nondistended. Bowel sounds present. No organomegaly or mass.  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     NEUROLOGIC: Cranial nerves II through XII are intact. No focal Motor or sensory deficits b/l.  Globally weak.  PSYCHIATRIC: The patient is alert and oriented x 3.  SKIN: No obvious rash, lesion, or ulcer.    LABORATORY PANEL:   CBC  Recent Labs Lab 05/15/17 0629  WBC 12.2*  HGB 11.9*  HCT 36.5*  PLT 160   ------------------------------------------------------------------------------------------------------------------  Chemistries   Recent Labs Lab 06/02/2017 1305 05/15/17 0629  NA 135 136  K 4.8 5.0  CL 107 106  CO2 19* 19*  GLUCOSE 173* 98  BUN 57* 59*  CREATININE 2.24* 2.24*  CALCIUM 7.4* 7.7*  AST 23  --   ALT 24  --   ALKPHOS 143*  --   BILITOT 0.8  --    ------------------------------------------------------------------------------------------------------------------  Cardiac Enzymes No results for input(s): TROPONINI in the last 168 hours. ------------------------------------------------------------------------------------------------------------------  RADIOLOGY:  Dg Ankle Complete Right  Result Date: 05/23/2017 CLINICAL DATA:  51 year old male with recent right ankle fracture in Charcot joint. Now with fever and chills. EXAM: RIGHT ANKLE - COMPLETE  3+ VIEW COMPARISON:  04/18/2017 and prior radiographs FINDINGS: Overlying fiberglass cast obscures fine detail.  Severe neuropathic/Charcot type changes within the ankle and foot are again identified. A medial malleolar fragment with 2.5 cm displacement is unchanged. Lateral positioning of the fibula and tibia in relation to the sclerotic and fragmented talus again noted. Vascular calcifications again noted. No definite new abnormality noted. IMPRESSION: No definite acute abnormality. Unchanged appearance of the right ankle with severe Charcot type changes and displaced medial malleolar fragment. Electronically Signed   By: Harmon Pier M.D.   On: 04/18/2017 15:27   Dg Foot 2 Views Right  Result Date: 05/15/2017 CLINICAL DATA:  Recent right ankle fracture and wound in severe Charcot joint. Now with fever and chills. EXAM: RIGHT FOOT - 2 VIEW COMPARISON:  04/18/2017 and prior radiographs FINDINGS: Fiberglass cast obscures fine detail. Severe neuropathic/ Charcot type changes within the ankle and hindfoot/midfoot again noted. Lateral subluxation of the tibia and fibula again noted. Soft tissue swelling is present. No definite new abnormality noted. Vascular calcifications are present. IMPRESSION: Soft tissue swelling without other new abnormalities since the prior study. Unchanged severe neuropathic/ Charcot type changes within the ankle and hindfoot/midfoot. Electronically Signed   By: Harmon Pier M.D.   On: 04/22/2017 15:30     ASSESSMENT AND PLAN:   51 year old male with past medical history of obstructive sleep apnea, hypertension, hyperlipidemia, hypothyroidism, GERD, diabetes, chronic kidney disease stage IV, history of coronary artery disease who presented to the hospital due to fever, right foot pain.  1. Right foot/ankle ulcer-x-rays consistent with severe Charcot-type joint changes. -Seen by podiatry, continue IV antibiotics and no plan for urgent surgical intervention now. Still having fever spikes and if they persist could consider debridement of the fibula with external fixation as per podiatry but this  is to be done at [facility] and not here. -Continue IV Cefepime, DC vancomycin as MRSA PCR is negative. Blood cultures, wound cultures are so far negative. -Follow fever curve. WBC count improving.  2. Diabetes type 2 without complication-continue Lantus, sliding scale insulin.  3. Diabetic neuropathy-continue gabapentin.  4. Hypothyroidism-continue Synthroid. Next  5. Sleep apnea-continue CPAP.  6. CHF-chronic diastolic CHF. Continue torsemide, Aldactone.  7. History of gout-no acute attack. Continue allopurinol.  8. Anxiety-continue Xanax as needed.    All the records are reviewed and case discussed with Care Management/Social Worker. Management plans discussed with the patient, family and they are in agreement.  CODE STATUS: Full code  DVT Prophylaxis: Hep. SQ  TOTAL TIME TAKING CARE OF THIS PATIENT: 30 minutes.   POSSIBLE D/C IN 1-2 DAYS, DEPENDING ON CLINICAL CONDITION.   Houston Siren M.D on 05/16/2017 at 12:41 PM  Between 7am to 6pm - Pager - [phone]  After 6pm go to [email or web address] - Social research officer, government  [facility] [place] [facility]  Office  [phone]  CC: Primary care physician; Rayetta Humphrey, MD

## 2017-05-16 NOTE — Progress Notes (Signed)
Daily Progress Note   Subjective  - * No surgery found *  F/u lateral ankle ulcer.  Pt still not feeling well.  Fever 101.6 today.  WBC is better to 12K  Objective Vitals:   05/15/17 0928 05/15/17 2200 05/15/17 2321 05/16/17 0946  BP: 117/63  131/79 (!) 144/72  Pulse: 86 84 86 94  Resp:   19 20  Temp: 98.7 F (37.1 C)  (!) 101.5 F (38.6 C) (!) 101.6 F (38.7 C)  TempSrc: Axillary  Oral Oral  SpO2: 92% 94% 92% 96%  Weight:      Height:        Physical Exam: Lateral ankle ulcer without purulence.  Minimal drainage today.  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   WBC is improving.  Still with fever.  Xrays shows medial mall fracture with dstruction of talus consistent with charcot.  No erosion to lateral fibula.  Culture negative for organisms day 1  Once fever dissipated recommend f/u with [facility] podiatry.  Pt has appt this Thursday July 5th.  If continue to spike fever could consider debridment but concern for debridment of fibula and would then likely require external fixation, not perfomed at this facility.  Will f/u tomorrow.  Gwyneth RevelsFowler, Alexes Lamarque A  05/16/2017, 9:58 AM

## 2017-05-17 LAB — GLUCOSE, CAPILLARY
GLUCOSE-CAPILLARY: 124 mg/dL — AB (ref 65–99)
GLUCOSE-CAPILLARY: 108 mg/dL — AB (ref 65–99)
Glucose-Capillary: 143 mg/dL — ABNORMAL HIGH (ref 65–99)
Glucose-Capillary: 106 mg/dL — ABNORMAL HIGH (ref 65–99)

## 2017-05-17 LAB — CREATININE, SERUM
Creatinine, Ser: 3.14 mg/dL — ABNORMAL HIGH (ref 0.61–1.24)
GFR calc non Af Amer: 21 mL/min — ABNORMAL LOW (ref >60)
GFR, EST AFRICAN AMERICAN: 25 mL/min — AB (ref >60)

## 2017-05-17 LAB — CBC
HEMATOCRIT: 32 % — AB (ref 40.0–52.0)
Hemoglobin: 10.5 g/dL — ABNORMAL LOW (ref 13.0–18.0)
MCH: 29.2 pg (ref 26.0–34.0)
MCHC: 32.7 g/dL (ref 32.0–36.0)
MCV: 89.4 fL (ref 80.0–100.0)
Platelets: 134 10*3/uL — ABNORMAL LOW (ref 150–440)
RBC: 3.58 MIL/uL — AB (ref 4.40–5.90)
RDW: 15.8 % — AB (ref 11.5–14.5)
WBC: 11.2 10*3/uL — AB (ref 3.8–10.6)

## 2017-05-17 MED ORDER — ONDANSETRON HCL 4 MG/2ML IJ SOLN
4.0000 mg | Freq: Four times a day (QID) | INTRAMUSCULAR | Status: DC | PRN
  Administered 2017-05-17: 4 mg via INTRAVENOUS
  Filled 2017-05-17: qty 2

## 2017-05-17 MED ORDER — MORPHINE SULFATE (PF) 2 MG/ML IV SOLN
2.0000 mg | INTRAVENOUS | Status: DC | PRN
  Administered 2017-05-17 – 2017-05-18 (×7): 2 mg via INTRAVENOUS
  Filled 2017-05-17 (×7): qty 1

## 2017-05-17 MED ORDER — DEXTROSE 5 % IV SOLN
1.0000 g | INTRAVENOUS | Status: DC
  Administered 2017-05-18: 1 g via INTRAVENOUS
  Filled 2017-05-17 (×2): qty 1

## 2017-05-17 MED ORDER — SODIUM CHLORIDE 0.9 % IV SOLN
INTRAVENOUS | Status: DC
  Administered 2017-05-17 – 2017-05-18 (×2): via INTRAVENOUS

## 2017-05-17 NOTE — Progress Notes (Signed)
Pt. C/o nausea. Dr.Willis ordered zofran q6h prn

## 2017-05-17 NOTE — Progress Notes (Signed)
Patient is A&O x4. Right LE cast in place with window at the ankle site. Dressing changed by MD this shift. Dressing to Left BKA , patient removed and requesting to leave off. Allergies to some adhesive dressings  NSL in LAC. Requesting IV Morphine throughout the day. Patient rested most of the day. See previous notes from today. Blood sugars 124, 143, and 108. On RA. Patient puts CPAP on self.

## 2017-05-17 NOTE — Progress Notes (Signed)
Tried to instruct patient on IS d/t lower WBC count, but still having low grade fevers. Patient stated that he knows how to is it, but didn't think he needed it. Explained to him the purpose of IS and left at bedside.

## 2017-05-17 NOTE — Progress Notes (Signed)
Pt. C/o pain 10 out of 10 in right foot. Dr. Anne HahnWillis notified and ordered for oxycodone be given early.

## 2017-05-17 NOTE — Progress Notes (Signed)
[employer] - [place] at [facility]   PATIENT NAME: [name]    MR#:  [id number]  DATE OF BIRTH:  December 30, 1965  SUBJECTIVE:   Patient here due to fever and suspected sepsis secondary to right foot/ankle ulcer with osteomyelitis. Still continues to have fever spikes another fever 102 late last night. No other acute events overnight.  REVIEW OF SYSTEMS:    Review of Systems  Constitutional: Positive for fever. Negative for chills.  HENT: Negative for congestion and tinnitus.   Eyes: Negative for blurred vision and double vision.  Respiratory: Negative for cough, shortness of breath and wheezing.   Cardiovascular: Negative for chest pain, orthopnea and PND.  Gastrointestinal: Negative for abdominal pain, diarrhea, nausea and vomiting.  Genitourinary: Negative for dysuria and hematuria.  Neurological: Negative for dizziness, sensory change and focal weakness.  All other systems reviewed and are negative.   Nutrition: Heart healthy/Carb modified Tolerating Diet: Yes Tolerating PT: Await Eval.   DRUG ALLERGIES:   Allergies  Allergen Reactions  . Other Anaphylaxis    "TREE NUTS"  . Pravastatin Other (See Comments)    MUSCLE ACHES AND ELEVATED CK  . Ace Inhibitors Cough  . Iodinated Diagnostic Agents Other (See Comments)    KIDNEY "DISORDER"  . Sulfamethoxazole-Trimethoprim Other (See Comments)    Mouth ulcers  . Allevyn Adhesive [Wound Dressings] Dermatitis    Pt states this adhesive causes severe blisters. Paper tape and band-aids ok.  . Amitriptyline Nausea Only    VITALS:  Blood pressure 114/63, pulse 74, temperature 98.7 F (37.1 C), temperature source Oral, resp. rate 16, height 5\' 6"  (1.676 m), weight 120.7 kg (266 lb), SpO2 98 %.  PHYSICAL EXAMINATION:   Physical Exam  GENERAL:  51 y.o.-year-old obese patient lying in bed in no acute distress.  EYES: Pupils equal, round, reactive to light and accommodation. No scleral icterus. Extraocular  muscles intact.  HEENT: Head atraumatic, normocephalic. Oropharynx and nasopharynx clear.  NECK:  Supple, no jugular venous distention. No thyroid enlargement, no tenderness.  LUNGS: Normal breath sounds bilaterally, no wheezing, rales, rhonchi. No use of accessory muscles of respiration.  CARDIOVASCULAR: S1, S2 normal. No murmurs, rubs, or gallops.  ABDOMEN: Soft, nontender, nondistended. Bowel sounds present. No organomegaly or mass.  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     NEUROLOGIC: Cranial nerves II through XII are intact. No focal Motor or sensory deficits b/l.  Globally weak.  PSYCHIATRIC: The patient is alert and oriented x 3.  SKIN: No obvious rash, lesion, or ulcer.    LABORATORY PANEL:   CBC  Recent Labs Lab 05/17/17 0322  WBC 11.2*  HGB 10.5*  HCT 32.0*  PLT 134*   ------------------------------------------------------------------------------------------------------------------  Chemistries   Recent Labs Lab 05/26/2017 1305 05/15/17 0629 05/17/17 0322  NA 135 136  --   K 4.8 5.0  --   CL 107 106  --   CO2 19* 19*  --   GLUCOSE 173* 98  --   BUN 57* 59*  --   CREATININE 2.24* 2.24* 3.14*  CALCIUM 7.4* 7.7*  --   AST 23  --   --   ALT 24  --   --   ALKPHOS 143*  --   --   BILITOT 0.8  --   --    ------------------------------------------------------------------------------------------------------------------  Cardiac Enzymes No results for input(s): TROPONINI in the last 168 hours. ------------------------------------------------------------------------------------------------------------------  RADIOLOGY:  No results found.   ASSESSMENT AND PLAN:  51 year old male with past medical history of obstructive sleep apnea, hypertension, hyperlipidemia, hypothyroidism, GERD, diabetes, chronic kidney disease stage IV, history of coronary artery disease who presented to the hospital due to fever, right foot pain.  1.  Right foot/ankle ulcer-x-rays consistent with severe Charcot-type joint changes. -Seen by podiatry, continue IV antibiotics and no plan for urgent surgical intervention now. Still having fever spikes.  Discuss with podiatry with Dr. Ether GriffinsFowler and continue current care for now and if we can keep patient afebrile for 24 hours discharge home on oral antibiotics with follow-[facility] where patient can get definitive treatment. -Continue IV Cefepime.  -Follow fever curve. WBC count improving.  2. Acute on Chronic Renal Failure - baseline creatinine around 1.5-2 and now elevated at 3.1. We'll start the patient on gentle IV fluid hydration and follow BUN and creatinine. -  Renal dose meds, avoid nephrotoxins.  3. Diabetes type 2 without complication-continue Lantus, sliding scale insulin. BS stable.   4. Diabetic neuropathy-continue gabapentin.  5. Hypothyroidism-continue Synthroid. Next  6. Sleep apnea-continue CPAP.  7. CHF-chronic diastolic CHF.  - will d/c Diuretics given Acute on Chronic Renal Failure.   8. History of gout-no acute attack. Continue allopurinol.  9. Anxiety-continue Xanax as needed.   All the records are reviewed and case discussed with Care Management/Social Worker. Management plans discussed with the patient, family and they are in agreement.  CODE STATUS: Full code  DVT Prophylaxis: Hep. SQ  TOTAL TIME TAKING CARE OF THIS PATIENT: 30 minutes.   POSSIBLE D/C IN 1-2 DAYS, DEPENDING ON CLINICAL CONDITION.   Houston SirenSAINANI,Shaman Muscarella J M.D on 05/17/2017 at 11:01 AM  Between 7am to 6pm - Pager - [phone]  After 6pm go to [email or web address] - Social research officer, governmentpassword EPAS [facility]  [facility] [place] [facility]  Office  [phone]  CC: Primary care physician; Rayetta HumphreyGeorge, Sionne A, MD

## 2017-05-17 NOTE — Progress Notes (Signed)
Pharmacy Antibiotic Note  [name] is a 51 y.o. male admitted on 04/24/2017 with wound infection.  Pharmacy has been consulted for cefepime dosing.  Plan: Patient is in AKI creatinine continues to increase. Will dose Cefepime as if CrCL <10 ml/min.  Will change to Cefepime 1 g IV q24 hours.   Height: 5\' 6"  (167.6 cm) Weight: 266 lb (120.7 kg) IBW/kg (Calculated) : 63.8  Temp (24hrs), Avg:100.3 F (37.9 C), Min:98.8 F (37.1 C), Max:102.2 F (39 C)   Recent Labs Lab 04/22/2017 1305 04/30/2017 1437 05/15/17 0629 05/17/17 0322  WBC 15.9*  --  12.2* 11.2*  CREATININE 2.24*  --  2.24* 3.14*  LATICACIDVEN  --  1.0  --   --     Estimated Creatinine Clearance: 34.1 mL/min (A) (by C-G formula based on SCr of 3.14 mg/dL (H)).    Allergies  Allergen Reactions  . Other Anaphylaxis    "TREE NUTS"  . Pravastatin Other (See Comments)    MUSCLE ACHES AND ELEVATED CK  . Ace Inhibitors Cough  . Iodinated Diagnostic Agents Other (See Comments)    KIDNEY "DISORDER"  . Sulfamethoxazole-Trimethoprim Other (See Comments)    Mouth ulcers  . Allevyn Adhesive [Wound Dressings] Dermatitis    Pt states this adhesive causes severe blisters. Paper tape and band-aids ok.  . Amitriptyline Nausea Only    Antimicrobials this admission: vancomycin 6/28 >> 6/30  cefepime 6/28 >>   Dose adjustments this admission:  Microbiology results: 6/28 BCx: NGTD x 3 days  6/28 MRSA PCR: Sent  Thank you for allowing pharmacy to be a part of this patient's care.  Demetrius CharityJames,Carely Nappier D, PharmD Clinical Pharmacist 05/17/2017 10:24 AM

## 2017-05-17 NOTE — Progress Notes (Signed)
Educated patient about new orders for 1 liter fluid bolus d/t creat 3.14. Patient stated that he can't have too much fluid due to his heart. Explained to patient about why the liter of fluid was important and his kidney function. Again, patient stated that his kidney function is normally not normal, but didn't know what the value was. On admit creat was 2.24.

## 2017-05-17 NOTE — Progress Notes (Signed)
Daily Progress Note   Subjective  - * No surgery found *  F/u right ankle ulcer. Pt with some malaise.  Appetite fair.  Eats but not full tray.  Objective Vitals:   05/16/17 0946 05/16/17 1415 05/16/17 2254 05/17/17 0004  BP: (!) 144/72 (!) 149/74 138/84   Pulse: 94 92 91   Resp: 20 18 18    Temp: (!) 101.6 F (38.7 C) (!) 101.1 F (38.4 C) (!) 102.2 F (39 C) 98.8 F (37.1 C)  TempSrc: Oral Oral Oral Oral  SpO2: 96% 96% 94%   Weight:      Height:        Physical Exam: Lateral ankle with some mild drainage.  No frank purulence.  Lateral distal fibula exposed.   Culture still pending with no growth. WBC is decreasing and at 11.2.   Laboratory CBC    Component Value Date/Time   WBC 11.2 (H) 05/17/2017 0322   HGB 10.5 (L) 05/17/2017 0322   HCT 32.0 (L) 05/17/2017 0322   PLT 134 (L) 05/17/2017 0322    BMET    Component Value Date/Time   NA 136 05/15/2017 0629   K 5.0 05/15/2017 0629   CL 106 05/15/2017 0629   CO2 19 (L) 05/15/2017 0629   GLUCOSE 98 05/15/2017 0629   BUN 59 (H) 05/15/2017 0629   CREATININE 3.14 (H) 05/17/2017 0322   CALCIUM 7.7 (L) 05/15/2017 0629   CALCIUM 9.2 05/04/2007 2216   GFRNONAA 21 (L) 05/17/2017 0322   GFRAA 25 (L) 05/17/2017 0322    Assessment/Planning: Lateral ankle ulcer  Charcot ankle   Spiked temp last night at 102.  98.8 this am.  WBC is trending down and cultures without growth.  D/W pt possible transfer but is hesitant for now.  If can keep afebrile can d/c and pt can f/u outpt with [facility] podiatry for definitive procedure.    Dressing changed today.  Gwyneth RevelsFowler, Samaiyah Howes A  05/17/2017, 8:32 AM

## 2017-05-17 NOTE — Progress Notes (Signed)
Went to check on patient and the IV pole was turned off.  Patient said he was getting filled up with fluids and didn't want the fluids.  Patient requesting his home dose of Zaroxolyn. Explained to patient about renal function again and he stated he felt overloaded. No edema noted and no crackles. Called Dr. Cherlynn KaiserSainani and informed him of above and talked directly to the patient.

## 2017-05-17 DEATH — deceased

## 2017-05-18 ENCOUNTER — Encounter (INDEPENDENT_AMBULATORY_CARE_PROVIDER_SITE_OTHER): Payer: Self-pay | Admitting: Ophthalmology

## 2017-05-18 ENCOUNTER — Inpatient Hospital Stay: Payer: PPO

## 2017-05-18 LAB — GLUCOSE, CAPILLARY
GLUCOSE-CAPILLARY: 144 mg/dL — AB (ref 65–99)
GLUCOSE-CAPILLARY: 111 mg/dL — AB (ref 65–99)
Glucose-Capillary: 66 mg/dL (ref 65–99)
Glucose-Capillary: 56 mg/dL — ABNORMAL LOW (ref 65–99)
Glucose-Capillary: 81 mg/dL (ref 65–99)
Glucose-Capillary: 133 mg/dL — ABNORMAL HIGH (ref 65–99)

## 2017-05-18 LAB — BASIC METABOLIC PANEL
Anion gap: 12 (ref 5–15)
BUN: 90 mg/dL — ABNORMAL HIGH (ref 6–20)
CALCIUM: 7.5 mg/dL — AB (ref 8.9–10.3)
CO2: 18 mmol/L — AB (ref 22–32)
CREATININE: 4.71 mg/dL — AB (ref 0.61–1.24)
Chloride: 101 mmol/L (ref 101–111)
GFR calc Af Amer: 15 mL/min — ABNORMAL LOW (ref >60)
GFR, EST NON AFRICAN AMERICAN: 13 mL/min — AB (ref >60)
GLUCOSE: 95 mg/dL (ref 65–99)
Potassium: 6 mmol/L — ABNORMAL HIGH (ref 3.5–5.1)
Sodium: 131 mmol/L — ABNORMAL LOW (ref 135–145)

## 2017-05-18 LAB — BRAIN NATRIURETIC PEPTIDE: B NATRIURETIC PEPTIDE 5: 785 pg/mL — AB (ref 0.0–100.0)

## 2017-05-18 LAB — POTASSIUM: Potassium: 6 mmol/L — ABNORMAL HIGH (ref 3.5–5.1)

## 2017-05-18 MED ORDER — SODIUM POLYSTYRENE SULFONATE 15 GM/60ML PO SUSP
60.0000 g | Freq: Once | ORAL | Status: AC
  Administered 2017-05-18: 60 g via ORAL
  Filled 2017-05-18: qty 240

## 2017-05-18 MED ORDER — SODIUM BICARBONATE 650 MG PO TABS
650.0000 mg | ORAL_TABLET | Freq: Three times a day (TID) | ORAL | Status: DC
  Administered 2017-05-18 (×2): 650 mg via ORAL
  Filled 2017-05-18 (×2): qty 1

## 2017-05-18 MED ORDER — CALCIUM GLUCONATE 10 % IV SOLN
1.0000 g | Freq: Once | INTRAVENOUS | Status: AC
  Administered 2017-05-18: 1 g via INTRAVENOUS
  Filled 2017-05-18: qty 10

## 2017-05-18 MED ORDER — FUROSEMIDE 10 MG/ML IJ SOLN
80.0000 mg | Freq: Once | INTRAMUSCULAR | Status: AC
  Administered 2017-05-18: 80 mg via INTRAVENOUS
  Filled 2017-05-18: qty 8

## 2017-05-18 MED ORDER — SODIUM CHLORIDE 0.9% FLUSH: 10.0000 mL | INTRAVENOUS | Status: DC | PRN

## 2017-05-18 MED ORDER — INSULIN GLARGINE 100 UNIT/ML ~~LOC~~ SOLN
20.0000 [IU] | Freq: Two times a day (BID) | SUBCUTANEOUS | Status: DC
  Administered 2017-05-18: 20 [IU] via SUBCUTANEOUS
  Filled 2017-05-18 (×3): qty 0.2

## 2017-05-18 MED ORDER — SODIUM POLYSTYRENE SULFONATE 15 GM/60ML PO SUSP
30.0000 g | Freq: Once | ORAL | Status: AC
Start: 2017-05-18 — End: 2017-05-18
  Administered 2017-05-18: 30 g via ORAL
  Filled 2017-05-18: qty 120

## 2017-05-18 MED ORDER — METOCLOPRAMIDE HCL 10 MG PO TABS
5.0000 mg | ORAL_TABLET | Freq: Three times a day (TID) | ORAL | Status: DC
  Administered 2017-05-18 (×2): 5 mg via ORAL
  Filled 2017-05-18 (×2): qty 1

## 2017-05-18 NOTE — Plan of Care (Signed)
Problem: Skin Integrity: Goal: Skin integrity will improve Outcome: Progressing Pt is progressing toward goals.   

## 2017-05-18 NOTE — Progress Notes (Signed)
Inpatient Diabetes Program Recommendations  AACE/ADA: New Consensus Statement on Inpatient Glycemic Control (2015)  Target Ranges:  Prepandial:   less than 140 mg/dL      Peak postprandial:   less than 180 mg/dL (1-2 hours)      Critically ill patients:  140 - 180 mg/dL   Lab Results  Component Value Date   GLUCAP 133 (H) 05/18/2017   HGBA1C (H) 04/17/2011    8.2 (NOTE)                                                                       According to the ADA Clinical Practice Recommendations for 2011, when HbA1c is used as a screening test:   >=6.5%   Diagnostic of Diabetes Mellitus           (if abnormal result  is confirmed)  5.7-6.4%   Increased risk of developing Diabetes Mellitus  References:Diagnosis and Classification of Diabetes Mellitus,Diabetes Care,2011,34(Suppl 1):S62-S69 and Standards of Medical Care in         Diabetes - 2011,Diabetes Care,2011,34  (Suppl 1):S11-S61.    Review of Glycemic Control  Results for Idamae SchullerHILLIPS, Larnell A (MRN [id number]) as of 05/18/2017 12:12  Ref. Range 05/17/2017 20:57 05/18/2017 06:29 05/18/2017 06:51 05/18/2017 07:22 05/18/2017 11:12  Glucose-Capillary Latest Ref Range: 65 - 99 mg/dL 130106 (H) 66 56 (L) 81 865133 (H)    Diabetes history: Type 2 Outpatient Diabetes medications: Toujeo 40 units bid, Novolog 12 units tid Current orders for Inpatient glycemic control: Lantus 40 units bid, Novolog 0-9 units tid  Inpatient Diabetes Program Recommendations:  Consider decreasing Lantus to 20 units bid.   Richard RacerJulie Kery Haltiwanger, RN, BA, MHA, CDE Diabetes Coordinator Inpatient Diabetes Program  [phone] (Team Pager) [phone] [facility] Office) 05/18/2017 12:16 PM

## 2017-05-18 NOTE — Progress Notes (Signed)
[employer] - [place] at [facility]   PATIENT NAME: [name]    MR#:  [id number]  DATE OF BIRTH:  10-Jul-1966  SUBJECTIVE:   No fever overnight but renal function has gotten worse and noted to be hyperkalemic.  Feels bloated and feels like his abdomen is swollen.    REVIEW OF SYSTEMS:    Review of Systems  Constitutional: Negative for chills and fever.  HENT: Negative for congestion and tinnitus.   Eyes: Negative for blurred vision and double vision.  Respiratory: Negative for cough, shortness of breath and wheezing.   Cardiovascular: Negative for chest pain, orthopnea and PND.  Gastrointestinal: Negative for abdominal pain, diarrhea, nausea and vomiting.  Genitourinary: Negative for dysuria and hematuria.  Neurological: Positive for weakness. Negative for dizziness, sensory change and focal weakness.  All other systems reviewed and are negative.   Nutrition: Heart healthy/Carb modified Tolerating Diet: Yes Tolerating PT: Await Eval.   DRUG ALLERGIES:   Allergies  Allergen Reactions  . Other Anaphylaxis    "TREE NUTS"  . Pravastatin Other (See Comments)    MUSCLE ACHES AND ELEVATED CK  . Ace Inhibitors Cough  . Iodinated Diagnostic Agents Other (See Comments)    KIDNEY "DISORDER"  . Sulfamethoxazole-Trimethoprim Other (See Comments)    Mouth ulcers  . Allevyn Adhesive [Wound Dressings] Dermatitis    Pt states this adhesive causes severe blisters. Paper tape and band-aids ok.  . Amitriptyline Nausea Only    VITALS:  Blood pressure (!) 124/45, pulse (!) 59, temperature 98.3 F (36.8 C), temperature source Oral, resp. rate 16, height 5\' 6"  (1.676 m), weight 120.7 kg (266 lb), SpO2 99 %.  PHYSICAL EXAMINATION:   Physical Exam  GENERAL:  51 y.o.-year-old obese patient lying in bed in no acute distress.  EYES: Pupils equal, round, reactive to light and accommodation. No scleral icterus. Extraocular muscles intact.  HEENT: Head atraumatic,  normocephalic. Oropharynx and nasopharynx clear.  NECK:  Supple, no jugular venous distention. No thyroid enlargement, no tenderness.  LUNGS: Poor Resp. Effort,  no wheezing, rales, rhonchi. No use of accessory muscles of respiration.  CARDIOVASCULAR: S1, S2 normal. No murmurs, rubs, or gallops.  ABDOMEN: Soft, nontender, nondistended. Bowel sounds present. No organomegaly or mass.  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     NEUROLOGIC: Cranial nerves II through XII are intact. No focal Motor or sensory deficits b/l.  Globally weak.  PSYCHIATRIC: The patient is alert and oriented x 3.  SKIN: No obvious rash, lesion, or ulcer.    LABORATORY PANEL:   CBC  Recent Labs Lab 05/17/17 0322  WBC 11.2*  HGB 10.5*  HCT 32.0*  PLT 134*   ------------------------------------------------------------------------------------------------------------------  Chemistries   Recent Labs Lab 05/27/2017 1305  05/18/17 0340  NA 135  < > 131*  K 4.8  < > 6.0*  CL 107  < > 101  CO2 19*  < > 18*  GLUCOSE 173*  < > 95  BUN 57*  < > 90*  CREATININE 2.24*  < > 4.71*  CALCIUM 7.4*  < > 7.5*  AST 23  --   --   ALT 24  --   --   ALKPHOS 143*  --   --   BILITOT 0.8  --   --   < > = values in this interval not displayed. ------------------------------------------------------------------------------------------------------------------  Cardiac Enzymes No results for input(s): TROPONINI in the last 168 hours. ------------------------------------------------------------------------------------------------------------------  RADIOLOGY:  No  results found.   ASSESSMENT AND PLAN:   51 year old male with past medical history of obstructive sleep apnea, hypertension, hyperlipidemia, hypothyroidism, GERD, diabetes, chronic kidney disease stage IV, history of coronary artery disease who presented to the hospital due to fever, right foot pain.  1. Right foot/ankle  ulcer-x-rays consistent with severe Charcot-type joint changes. -Seen by podiatry, continue IV antibiotics and no plan for urgent surgical intervention now. Afebrile overnight. -  Discussed with podiatry with Dr. Ether GriffinsFowler and continue current care for now and discharge home on oral antibiotics with follow-[facility] where patient can get definitive treatment. -Continue IV Cefepime.   2. Acute on Chronic Renal Failure - baseline creatinine around 1.5-2 and now elevated at 4.7.  - Etiology unclear presently. ?? ATN due to overdiuresis.  Will get Nephro consult.  ?? IV fluids.  Got one dose of IV Lasix earlier today.  -  Renal dose meds, avoid nephrotoxins.  3. Hyperkalemia - due to # 2.  - will give Kayexylate, Insulin, D50, calcium gluconate.  - will follow potassium.   3. Diabetes type 2 without complication- was a bit hypoglycemic earlier today.  Lantus dose has been adjusted.   - appreciate Diabetes coordinator input.  Cont. Adjusted dose lantus, SSI.   4. Diabetic neuropathy-continue gabapentin.  5. Hypothyroidism-continue Synthroid. Next  6. Sleep apnea-continue CPAP.  7. CHF- acute on chronic systolic Dysfunction.  - hold of diuresis for now.   8. History of gout-no acute attack. Continue allopurinol.  9. Anxiety-continue Xanax as needed.   All the records are reviewed and case discussed with Care Management/Social Worker. Management plans discussed with the patient, family and they are in agreement.  CODE STATUS: Full code  DVT Prophylaxis: Hep. SQ  TOTAL TIME TAKING CARE OF THIS PATIENT: 35 minutes.   POSSIBLE D/C IN 2-3 DAYS, DEPENDING ON CLINICAL CONDITION.   [place] SirenSAINANI,Melvin Marmo J M.D on 05/18/2017 at 1:42 PM  Between 7am to 6pm - Pager - [phone]  After 6pm go to [email or web address] - Social research officer, governmentpassword EPAS [facility]  [facility] [place] [facility]  Office  [phone]  CC: Primary care physician; Rayetta HumphreyGeorge, Sionne A, MD

## 2017-05-18 NOTE — Progress Notes (Signed)
Shift assessment completed at 0730. Pt awake, alert and oriented, in no distress, but rating pain to R leg at 10/10. Pt had received pain medication prior to assessment. Pt has his own cpap at bedsdie and is using this when asleep, has it in reach. Lungs are decreased to bilat bases. Hr is regular, abdomen is soft, bs heard. Pt is oob to commode to void prn, RLE has cast intact, cap refill to toes is wnl. L leg is bka with dressing intact. This writer elevated R leg. Iv site intact to lac, site is free of redness and swelling. Since assessment, pt has had nephrology consult completed, renal UD completed. Pt has received pain medication regularly, including iv morphine by his demand for this. Pt now is drowsy, falling asleep in conversation, is using cpap.Pt briefly has ivf infusing and was dc'd. Pt is now on fluid restriction and is aware of this. Pt has call bell in reach.

## 2017-05-18 NOTE — Progress Notes (Signed)
Pt off floor.  Chart reviewed.  Pt afebrile x 24hrs. Now with ARF.  Nephrology seeing Will have dressing changed daily. Will f/u tomorrow.

## 2017-05-18 NOTE — Consult Note (Signed)
[facility]  CONSULT NOTE    Date: 05/18/2017                  Patient Name:  [name]  MRN: [id number]  DOB: May 05, 1966  Age / Sex: 51 y.o., male         PCP: Rayetta Humphrey, MD                 Service Requesting Consult: Dr. Cherlynn Kaiser                 Reason for Consult: Acute Renal Failure            History of Present Illness: [name] is a 51 y.o. black male with diabetes mellitus type II insulin dependent, diabetic neuropathy, hypertension, coronary artery disease, hypothyroidism, obstructive sleep apnea, gout, GERD, systolic congestive heart failure, left BKA, anxiety, who was admitted to [facility] on 13-Jun-2017 for SIRS (systemic inflammatory response syndrome) (HCC) [R65.10] Charcot's joint, right ankle and foot [M14.671] Skin ulcer, unspecified ulcer stage (HCC) [L98.499]  Patient's creatinine trended from 2.24 on admission to 4.71 with hyperkalemia. Given kayexalate.   Patient was given torsemide and spironolactone. Patient started on IV fluids NS at 50   Medications: Outpatient medications: Prescriptions Prior to Admission  Medication Sig Dispense Refill Last Dose  . albuterol (VENTOLIN HFA) 108 (90 Base) MCG/ACT inhaler Inhale 2 puffs into the lungs every 6 (six) hours as needed.   prn at prn  . allopurinol (ZYLOPRIM) 100 MG tablet Take 100 mg by mouth daily.   June 13, 2017 at am  . carvedilol (COREG) 6.25 MG tablet Take 6.25 mg by mouth every 12 (twelve) hours.   06-13-2017 at am  . clobetasol ointment (TEMOVATE) 0.05 % Apply 1 application topically 2 (two) times daily as needed (for itchy skin).    prn at prn  . clopidogrel (PLAVIX) 75 MG tablet Take 75 mg by mouth daily.   06/13/17 at am  . cyclobenzaprine (FLEXERIL) 10 MG tablet Take 1 tablet (10 mg total) by mouth 3 (three) times daily as needed. 30 tablet 0 prn at prn  . doxycycline (VIBRA-TABS) 100 MG tablet Take 100 mg by mouth 2 (two) times daily.    06/13/17 at am  .  fluticasone (FLONASE) 50 MCG/ACT nasal spray Place 1 spray into the nose 2 (two) times daily as needed (for allergies.).    prn at prn  . gabapentin (NEURONTIN) 300 MG capsule TAKE ONE CAPSULE BY MOUTH TWICE DAILY (Patient taking differently: TAKE 1 CAPSULE (300 MG) BY MOUTH THREE TIMES DAILY) 180 capsule 3 2017-06-13 at am  . ibuprofen (ADVIL,MOTRIN) 800 MG tablet Take 800 mg by mouth every 8 (eight) hours as needed (for pain.).    prn at prn  . insulin aspart (NOVOLOG FLEXPEN) 100 UNIT/ML FlexPen Inject 12 Units into the skin 4 (four) times daily as needed for high blood sugar (blood sugar greater than 200). Plus sliding scale    06-13-2017 at 1100  . Insulin Glargine (TOUJEO SOLOSTAR) 300 UNIT/ML SOPN Inject 40 Units into the skin 2 (two) times daily.    June 13, 2017 at am  . levothyroxine (SYNTHROID, LEVOTHROID) 75 MCG tablet Take 75 mcg by mouth daily before breakfast. Take on a empty stomach.   05/13/2017 at am  . loratadine (CLARITIN) 10 MG tablet Take 10 mg by mouth daily.   2017-06-13 at am  . magnesium oxide (MAG-OX) 400 MG tablet Take 400 mg by mouth daily.  05/04/2017 at am  . metoCLOPramide (REGLAN) 10 MG tablet Take 10 mg by mouth 3 (three) times daily.   04/19/2017 at am  . metolazone (ZAROXOLYN) 5 MG tablet Take 2.5-5 mg by mouth as directed. TAKE 0.5-1 TABLET (2.5 MG-5 MG) DAILY AS NEEDED FOR WEIGHT GAIN GREATER THAN 5 LBS (FLUID RETENTION)   prn at prn  . mupirocin ointment (BACTROBAN) 2 % Apply 1 application topically 2 (two) times daily. (Patient taking differently: Apply 1 application topically 2 (two) times daily as needed (for itchy skin/irritation). ) 30 g 2 prn at prn  . nitroGLYCERIN (NITROSTAT) 0.4 MG SL tablet Place 1 tablet under the tongue every 5 (five) minutes x 3 doses as needed.   prn at prn  . Omeprazole-Sodium Bicarbonate (ZEGERID) 20-1100 MG CAPS capsule Take 1 capsule by mouth daily.   04/20/2017 at am  . oxyCODONE-acetaminophen (PERCOCET/ROXICET) 5-325 MG tablet Take 1-2  tablets by mouth every 6 (six) hours as needed (for moderate to severe pain.).    prn at prn  . senna (SENNA-TIME) 8.6 MG tablet Take 1-2 tablets by mouth daily as needed (for mild to moderate constipation).    prn at prn  . spironolactone (ALDACTONE) 25 MG tablet Take 25 mg by mouth daily.     04/25/2017 at am  . torsemide (DEMADEX) 20 MG tablet Take 20 mg by mouth 2 (two) times daily.    05/15/2017 at am  . vitamin C (ASCORBIC ACID) 500 MG tablet Take 500 mg by mouth daily.   05/13/2017 at am  . zinc gluconate 50 MG tablet Take 50 mg by mouth daily.   04/17/2017 at am  . bacitracin-polymyxin b (POLYSPORIN) ophthalmic ointment Place 1 application into the right eye 3 (three) times daily. apply to eye every 12 hours while awake (Patient not taking: Reported on 05/04/2017) 3.5 g 0 Not Taking at Unknown time  . gatifloxacin (ZYMAXID) 0.5 % SOLN Place 1 drop into the right eye 4 (four) times daily. (Patient not taking: Reported on 04/21/2017)   Not Taking at Unknown time  . oxyCODONE-acetaminophen (PERCOCET) 7.5-325 MG tablet Take 1 tablet by mouth every 4 (four) hours as needed for severe pain. (Patient not taking: Reported on 05/09/2017) 20 tablet 0 Completed Course at Unknown time  . prednisoLONE acetate (PRED FORTE) 1 % ophthalmic suspension Place 1 drop into the right eye 4 (four) times daily. (Patient not taking: Reported on 04/26/2017) 5 mL 0 Not Taking at Unknown time    Current medications: Current Facility-Administered Medications  Medication Dose Route Frequency Provider Last Rate Last Dose  . acetaminophen (TYLENOL) tablet 650 mg  650 mg Oral Q6H PRN Houston Siren, MD   650 mg at 05/17/17 1806  . albuterol (PROVENTIL) (2.5 MG/3ML) 0.083% nebulizer solution 2.5 mg  2.5 mg Nebulization Q6H PRN Demetrius Charity, RPH      . allopurinol (ZYLOPRIM) tablet 100 mg  100 mg Oral Daily Altamese Dilling, MD   100 mg at 05/18/17 0909  . ALPRAZolam Prudy Feeler) tablet 0.25 mg  0.25 mg Oral TID PRN Katharina Caper, MD   0.25 mg at 05/17/17 2026  . ceFEPIme (MAXIPIME) 1 g in dextrose 5 % 50 mL IVPB  1 g Intravenous Q24H Altamese Dilling, MD   Stopped at 05/18/17 [phone]  . cyclobenzaprine (FLEXERIL) tablet 10 mg  10 mg Oral TID PRN Altamese Dilling, MD   10 mg at 05/18/17 0908  . docusate sodium (COLACE) capsule 100 mg  100 mg Oral BID  PRN Altamese Dilling, MD   100 mg at 05/16/17 1000  . fluticasone (FLONASE) 50 MCG/ACT nasal spray 1 spray  1 spray Each Nare BID PRN Altamese Dilling, MD      . gabapentin (NEURONTIN) capsule 300 mg  300 mg Oral BID Altamese Dilling, MD   300 mg at 05/18/17 0908  . heparin injection 5,000 Units  5,000 Units Subcutaneous Q8H Altamese Dilling, MD   5,000 Units at 05/18/17 1447  . insulin aspart (novoLOG) injection 0-9 Units  0-9 Units Subcutaneous TID WC Altamese Dilling, MD   1 Units at 05/17/17 1234  . insulin glargine (LANTUS) injection 20 Units  20 Units Subcutaneous BID Houston Siren, MD      . levothyroxine (SYNTHROID, LEVOTHROID) tablet 75 mcg  75 mcg Oral QAC breakfast Altamese Dilling, MD   75 mcg at 05/18/17 0720  . loratadine (CLARITIN) tablet 10 mg  10 mg Oral Daily Altamese Dilling, MD   10 mg at 05/18/17 0908  . magnesium oxide (MAG-OX) tablet 400 mg  400 mg Oral Daily Altamese Dilling, MD   400 mg at 05/18/17 0908  . metoCLOPramide (REGLAN) tablet 5 mg  5 mg Oral TID Houston Siren, MD   5 mg at 05/18/17 1446  . morphine 2 MG/ML injection 2 mg  2 mg Intravenous Q3H PRN Houston Siren, MD   2 mg at 05/18/17 1455  . nitroGLYCERIN (NITROSTAT) SL tablet 0.4 mg  0.4 mg Sublingual Q5 Min x 3 PRN Altamese Dilling, MD      . ondansetron [facility]) injection 4 mg  4 mg Intravenous Q6H PRN Oralia Manis, MD   4 mg at 05/17/17 2142  . oxyCODONE (Oxy IR/ROXICODONE) immediate release tablet 10 mg  10 mg Oral Q4H PRN Katharina Caper, MD   10 mg at 05/18/17 1446  . senna (SENOKOT) tablet 8.6-17.2 mg  1-2  tablet Oral Daily PRN Altamese Dilling, MD   8.6 mg at 05/15/17 2157  . sodium bicarbonate tablet 650 mg  650 mg Oral TID Lamont Dowdy, MD   650 mg at 05/18/17 1447  . sodium polystyrene (KAYEXALATE) 15 GM/60ML suspension 60 g  60 g Oral Once Jesilyn Easom, MD      . vitamin C (ASCORBIC ACID) tablet 500 mg  500 mg Oral Daily Altamese Dilling, MD   500 mg at 05/18/17 0909  . zinc sulfate capsule 220 mg  220 mg Oral Daily Altamese Dilling, MD   220 mg at 05/18/17 1610      Allergies: Allergies  Allergen Reactions  . Other Anaphylaxis    "TREE NUTS"  . Pravastatin Other (See Comments)    MUSCLE ACHES AND ELEVATED CK  . Ace Inhibitors Cough  . Iodinated Diagnostic Agents Other (See Comments)    KIDNEY "DISORDER"  . Sulfamethoxazole-Trimethoprim Other (See Comments)    Mouth ulcers  . Allevyn Adhesive [Wound Dressings] Dermatitis    Pt states this adhesive causes severe blisters. Paper tape and band-aids ok.  . Amitriptyline Nausea Only      Past Medical History: Past Medical History:  Diagnosis Date  . Allergic rhinitis   . Anxiety    HX PANIC ATTACK  . CAD (coronary artery disease)   . Chronic kidney disease (CKD), stage IV (severe) (HCC)   . Complication of anesthesia    TROUBLE  WITH WAKING UP. SURGERY AMPUTATION AT [facility]  . Congestive heart failure (HCC)   . Diabetes mellitus type II   . GERD (gastroesophageal reflux disease)   .  Gout   . History of nephrolithiasis   . Hyperlipidemia   . Hypertension   . Hypothyroidism   . Low back pain   . Myocardial infarction (HCC)   . Neuropathy   . Sleep apnea    CPAP   . Thyroid disease      Past Surgical History: Past Surgical History:  Procedure Laterality Date  . 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 CORONARY ANGIOPLASTY WITH STENT PLACEMENT     2008  . GAS/FLUID EXCHANGE Right 02/10/2017   Procedure: GAS/FLUID EXCHANGE;  Surgeon: Sherrie [name], MD;  Location: [facility] OR;  Service: Ophthalmology;   Laterality: Right;  . LASER PHOTO ABLATION Right 02/10/2017   Procedure: LASER PHOTO ABLATION;  Surgeon: Sherrie [name], MD;  Location: [facility] OR;  Service: Ophthalmology;  Laterality: Right;  . MEMBRANE PEEL Right 02/10/2017   Procedure: MEMBRANE PEEL;  Surgeon: Sherrie [name], MD;  Location: [facility] OR;  Service: Ophthalmology;  Laterality: Right;  . PARS PLANA VITRECTOMY 27 GAUGE WITH 25 GAUGE PORT Right 02/10/2017   Procedure: PARS PLANA VITRECTOMY 27 GAUGE WITH 27 GAUGE PORT;  Surgeon: Sherrie [name], MD;  Location: [facility] OR;  Service: Ophthalmology;  Laterality: Right;  . REFRACTIVE SURGERY     RIGHT EYE  . 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 stress cardiolite    . TRACHEOSTOMY     2012    (NUT ALLERGY-    [place] [facility])     Family History: Family History  Problem Relation Age of Onset  . Diabetes Father      Social History: Social History   Social History  . Marital status: Married    Spouse name: N/A  . Number of children: N/A  . Years of education: N/A   Occupational History  . Not on file.   Social History Main Topics  . Smoking status: Never Smoker  . Smokeless tobacco: Never Used  . Alcohol use No  . Drug use: No  . Sexual activity: Not on file   Other Topics Concern  . Not on file   Social History Narrative  . No narrative on file     Review of Systems: Review of Systems  Constitutional: Negative.  Negative for chills, diaphoresis, fever, malaise/fatigue and weight loss.  HENT: Negative.  Negative for congestion, ear discharge, ear pain, hearing loss, nosebleeds, sinus pain, sore throat and tinnitus.   Eyes: Negative for blurred vision, double vision, photophobia, pain, discharge and redness.  Respiratory: Negative.  Negative for cough, hemoptysis, sputum production, shortness  of breath, wheezing and stridor.   Cardiovascular: Positive for leg swelling. Negative for chest pain, palpitations, orthopnea, claudication and PND.  Gastrointestinal: Negative.  Negative for abdominal pain, blood in stool, constipation, diarrhea, heartburn, melena, nausea and vomiting.  Genitourinary: Negative.  Negative for dysuria, flank pain, frequency, hematuria and urgency.  Musculoskeletal: Negative.  Negative for back pain, falls, joint pain, myalgias and neck pain.  Skin: Negative.  Negative for itching and rash.  Neurological: Negative.  Negative for dizziness, tingling, tremors, sensory change, speech change, focal weakness, seizures, loss of consciousness, weakness and headaches.  Endo/Heme/Allergies: Negative.  Negative for environmental allergies and polydipsia. Does not bruise/bleed easily.  Psychiatric/Behavioral: Negative.  Negative for depression, hallucinations, memory loss, substance abuse and suicidal ideas. The patient is not nervous/anxious  and does not have insomnia.     Vital Signs: Blood pressure (!) 124/45, pulse (!) 59, temperature 98.3 F (36.8 C), temperature source Oral, resp. rate 16, height 5\' 6"  (1.676 m), weight 120.7 kg (266 lb), SpO2 99 %.  Weight trends: Filed Weights   04/20/2017 1248  Weight: 120.7 kg (266 lb)    Physical Exam: General: NAD, laying in bed  Head: Normocephalic, atraumatic. Moist oral mucosal membranes  Eyes: Anicteric, PERRL  Neck: Supple, trachea midline  Lungs:  Clear to auscultation  Heart: Regular rate and rhythm  Abdomen:  Soft, nontender, obese  Extremities: no peripheral edema.  Neurologic: Right foot in caste, left BKA  Skin: No lesions  Access: none     Lab results: Basic Metabolic Panel:  Recent Labs Lab 04/19/2017 1305 05/15/17 0629 05/17/17 0322 05/18/17 0340 05/18/17 1319  NA 135 136  --  131*  --   K 4.8 5.0  --  6.0* 6.0*  CL 107 106  --  101  --   CO2 19* 19*  --  18*  --   GLUCOSE 173* 98  --  95   --   BUN 57* 59*  --  90*  --   CREATININE 2.24* 2.24* 3.14* 4.71*  --   CALCIUM 7.4* 7.7*  --  7.5*  --     Liver Function Tests:  Recent Labs Lab 04/27/2017 1305  AST 23  ALT 24  ALKPHOS 143*  BILITOT 0.8  PROT 7.2  ALBUMIN 3.4*   No results for input(s): LIPASE, AMYLASE in the last 168 hours. No results for input(s): AMMONIA in the last 168 hours.  CBC:  Recent Labs Lab 04/21/2017 1305 05/15/17 0629 05/17/17 0322  WBC 15.9* 12.2* 11.2*  NEUTROABS 13.5*  --   --   HGB 11.5* 11.9* 10.5*  HCT 35.0* 36.5* 32.0*  MCV 86.9 88.9 89.4  PLT 173 160 134*    Cardiac Enzymes: No results for input(s): CKTOTAL, CKMB, CKMBINDEX, TROPONINI in the last 168 hours.  BNP: Invalid input(s): POCBNP  CBG:  Recent Labs Lab 05/17/17 2057 05/18/17 0629 05/18/17 0651 05/18/17 0722 05/18/17 1112  GLUCAP 106* 66 56* 81 133*    Microbiology: Results for orders placed or performed during the hospital encounter of 04/26/2017  Aerobic/Anaerobic Culture (surgical/deep wound)     Status: None (Preliminary result)   Collection Time: 05/04/2017  2:34 PM  Result Value Ref Range Status   Specimen Description WOUND  Final   Special Requests Normal  Final   Gram Stain   Final    MODERATE WBC PRESENT, PREDOMINANTLY PMN RARE GRAM POSITIVE COCCOBACILLUS Performed at [facility], [address] [place], [place] [address]    Culture   Final    FEW DIPHTHEROIDS(CORYNEBACTERIUM SPECIES) Standardized susceptibility testing for this organism is not available. NO ANAEROBES ISOLATED; CULTURE IN PROGRESS FOR 5 DAYS    Report Status PENDING  Incomplete  Blood culture (routine x 2)     Status: None (Preliminary result)   Collection Time: 04/19/2017  2:37 PM  Result Value Ref Range Status   Specimen Description BLOOD BLOOD RIGHT FOREARM  Final   Special Requests   Final    BOTTLES DRAWN AEROBIC AND ANAEROBIC Blood Culture adequate volume   Culture NO GROWTH 4 DAYS  Final   Report Status  PENDING  Incomplete  Blood culture (routine x 2)     Status: None (Preliminary result)   Collection Time: 04/18/2017  2:38 PM  Result Value Ref Range Status   Specimen Description BLOOD LEFT ASSIST CONTROL  Final   Special Requests   Final    BOTTLES DRAWN AEROBIC AND ANAEROBIC Blood Culture adequate volume   Culture NO GROWTH 4 DAYS  Final   Report Status PENDING  Incomplete  MRSA PCR Screening     Status: None   Collection Time: 04/17/2017  7:39 PM  Result Value Ref Range Status   MRSA by PCR NEGATIVE NEGATIVE Final    Comment:        The GeneXpert MRSA Assay (FDA approved for NASAL specimens only), is one component of a comprehensive MRSA colonization surveillance program. It is not intended to diagnose MRSA infection nor to guide or monitor treatment for MRSA infections.   Urine culture     Status: None   Collection Time: 04/23/2017  7:40 PM  Result Value Ref Range Status   Specimen Description URINE, CLEAN CATCH  Final   Special Requests NONE  Final   Culture   Final    NO GROWTH Performed at [facility], [address] [place], [place] [address]    Report Status 05/16/2017 FINAL  Final  Aerobic/Anaerobic Culture (surgical/deep wound)     Status: None (Preliminary result)   Collection Time: 05/15/17  7:00 AM  Result Value Ref Range Status   Specimen Description ULCER  Final   Special Requests NONE  Final   Gram Stain   Final    NO WBC SEEN NO ORGANISMS SEEN Performed at [facility], [address] [place], [place] [address]    Culture   Final    RARE CORYNEBACTERIUM STRIATUM Standardized susceptibility testing for this organism is not available. NO ANAEROBES ISOLATED; CULTURE IN PROGRESS FOR 5 DAYS    Report Status PENDING  Incomplete    Coagulation Studies: No results for input(s): LABPROT, INR in the last 72 hours.  Urinalysis: No results for input(s): COLORURINE, LABSPEC, PHURINE, GLUCOSEU, HGBUR, BILIRUBINUR, KETONESUR, PROTEINUR,  UROBILINOGEN, NITRITE, LEUKOCYTESUR in the last 72 hours.  Invalid input(s): APPERANCEUR    Imaging:  No results found.   Assessment & Plan: [name] is a 51 y.o. black male with diabetes mellitus type II insulin dependent, diabetic neuropathy, hypertension, coronary artery disease, hypothyroidism, obstructive sleep apnea, gout, GERD, systolic congestive heart failure, left BKA, anxiety, who was admitted to [facility] on 04/21/2017 for SIRS (systemic inflammatory response syndrome) (HCC) [R65.10] Charcot's joint, right ankle and foot [M14.671] Skin ulcer, unspecified ulcer stage (HCC) [L98.499]  1. Acute renal failure with hyperkalemia and metabolic acidosis on chronic kidney disease stage III with proteinuria: baseline creatinine of 2.4, GFR of 35 on 6/12.  Acute renal failure from overdiuresis versus acute cardiorenal syndrome.  Chronic kidney disease secondary to diabetic nephropathy, hypertension and acute renal injury with limited recovery. Required dialysis in the past.  Denies use of NSAIDs. - Hold diuretics.  - Discontinue IV fluids - Start sodium bicarbonate for metabolic acidosis and hyperkalemia - Low potassium diet - Discussed dialysis with patient.   2. Hypertension: without acute exacerbation of systolic/diastolic congestive heart failure. Blood pressure at goal.  - holding blood pressure medications.   3. Diabetes mellitus type II with chronic kidney disease: insulin dependent.  - Continue glucose control.   LOS: 4 Evangela Heffler 7/2/20183:29 PM

## 2017-05-18 NOTE — Progress Notes (Signed)
Pts. Bun , creatinine and potassium are elevated. Dr. Sheryle Hailiamond is putting in orders and also ordering nephrology consult.

## 2017-05-19 LAB — CULTURE, BLOOD (ROUTINE X 2)
Culture: NO GROWTH
Culture: NO GROWTH
SPECIAL REQUESTS: ADEQUATE
Special Requests: ADEQUATE

## 2017-05-19 LAB — GLUCOSE, CAPILLARY: Glucose-Capillary: 65 mg/dL (ref 65–99)

## 2017-05-19 MED ORDER — DEXTROSE 50 % IV SOLN
INTRAVENOUS | Status: AC
  Filled 2017-05-19: qty 50

## 2017-05-20 LAB — AEROBIC/ANAEROBIC CULTURE W GRAM STAIN (SURGICAL/DEEP WOUND)
Gram Stain: NONE SEEN
Special Requests: NORMAL

## 2017-05-20 LAB — AEROBIC/ANAEROBIC CULTURE (SURGICAL/DEEP WOUND)

## 2017-05-25 NOTE — Progress Notes (Signed)
This encounter was created in error - please disregard.

## 2017-05-26 DIAGNOSIS — Z89512 Acquired absence of left leg below knee: Secondary | ICD-10-CM | POA: Diagnosis not present

## 2017-05-26 DIAGNOSIS — E1140 Type 2 diabetes mellitus with diabetic neuropathy, unspecified: Secondary | ICD-10-CM | POA: Diagnosis not present

## 2017-06-17 NOTE — Significant Event (Signed)
Code Blue called at 03:10 am 05/19/16 upon arrival at bedside CPR in progress and ACLS protocol initiated.  ER physician arrived at bedside and provided assistance with airway management.  Cardiac rhythm on AED vtach, therefore pt shocked x1 and cardiac rhythm sinus brady with pulse present.  However, over the next few minutes pt lost pulse, therefore CPR resumed. Following prolonged ACLS protocol unable to revive pt.  Cardiac Arrest likely secondary to hyperkalemia vs. SIRS. Please see code sheet for additional details. Notified pts wife via telephone she stated she would arrive at pts bedside shortly.  [name], AGNP  Pulmonary/Critical Care Pager [phone] (please enter 7 digits) PCCM Consult Pager [phone] (please enter 7 digits)

## 2017-06-17 NOTE — Progress Notes (Signed)
CH. responded to code blue. Pt. did not survive. CH. stayed until family arrived and were brief by doctor. CH. offered emotional and loss support.

## 2017-06-17 NOTE — ED Provider Notes (Signed)
[facility]  Department of Emergency Medicine   Code Blue CONSULT NOTE  Chief Complaint: Cardiac arrest/unresponsive   Level V Caveat: Unresponsive  History of present illness: I was contacted by the hospital for a CODE BLUE cardiac arrest upstairs and presented to the patient's bedside.  Patient was admitted for SIRS. Verbal report at bedside as the patient also had hyperkalemia. This practitioner at bedside running medical management of code on my arrival, I proceeded with airway management.  ROS: Unable to obtain, Level V caveat  Scheduled Meds: . dextrose      . allopurinol  100 mg Oral Daily  . gabapentin  300 mg Oral BID  . heparin  5,000 Units Subcutaneous Q8H  . insulin aspart  0-9 Units Subcutaneous TID WC  . insulin glargine  20 Units Subcutaneous BID  . levothyroxine  75 mcg Oral QAC breakfast  . loratadine  10 mg Oral Daily  . magnesium oxide  400 mg Oral Daily  . metoCLOPramide  5 mg Oral TID  . sodium bicarbonate  650 mg Oral TID  . vitamin C  500 mg Oral Daily  . zinc sulfate  220 mg Oral Daily   Continuous Infusions: . ceFEPime (MAXIPIME) IV Stopped (05/18/17 0938)   PRN Meds:.acetaminophen, albuterol, ALPRAZolam, cyclobenzaprine, docusate sodium, fluticasone, morphine injection, nitroGLYCERIN, ondansetron (ZOFRAN) IV, oxyCODONE, senna, sodium chloride flush Past Medical History:  Diagnosis Date  . Allergic rhinitis   . Anxiety    HX PANIC ATTACK  . CAD (coronary artery disease)   . Chronic kidney disease (CKD), stage IV (severe) ([place])   . Complication of anesthesia    TROUBLE  WITH WAKING UP. SURGERY AMPUTATION AT [facility]  . Congestive heart failure ([place])   . Diabetes mellitus type II   . GERD (gastroesophageal reflux disease)   . Gout   . History of nephrolithiasis   . Hyperlipidemia   . Hypertension   . Hypothyroidism   . Low back pain   . Myocardial infarction ([place])   . Neuropathy   . Sleep apnea    CPAP   . Thyroid disease     Past Surgical History:  Procedure Laterality Date  . 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 CORONARY ANGIOPLASTY WITH STENT PLACEMENT     2008  . GAS/FLUID EXCHANGE Right 02/10/2017   Procedure: GAS/FLUID EXCHANGE;  Surgeon: Hayden Pedro, MD;  Location: [place];  Service: Ophthalmology;  Laterality: Right;  . LASER PHOTO ABLATION Right 02/10/2017   Procedure: LASER PHOTO ABLATION;  Surgeon: Hayden Pedro, MD;  Location: [place];  Service: Ophthalmology;  Laterality: Right;  . MEMBRANE PEEL Right 02/10/2017   Procedure: MEMBRANE PEEL;  Surgeon: Hayden Pedro, MD;  Location: [place];  Service: Ophthalmology;  Laterality: Right;  . PARS PLANA VITRECTOMY 27 GAUGE WITH 25 GAUGE PORT Right 02/10/2017   Procedure: PARS PLANA VITRECTOMY 27 GAUGE WITH 27 GAUGE PORT;  Surgeon: Hayden Pedro, MD;  Location: [place];  Service: Ophthalmology;  Laterality: Right;  . REFRACTIVE SURGERY     RIGHT EYE  . REPAIR OF COMPLEX TRACTION RETINAL DETACHMENT Right 02/10/2017   Procedure: REPAIR OF COMPLEX TRACTION RETINAL DETACHMENT;  Surgeon: Hayden Pedro, MD;  Location: [place];  Service: Ophthalmology;  Laterality: Right;  . stress cardiolite    . TRACHEOSTOMY     2012    (NUT ALLERGY-    [facility])   Social History   Social History  . Marital  status: Married    Spouse name: N/A  . Number of children: N/A  . Years of education: N/A   Occupational History  . Not on file.   Social History Main Topics  . Smoking status: Never Smoker  . Smokeless tobacco: Never Used  . Alcohol use No  . Drug use: No  . Sexual activity: Not on file   Other Topics Concern  . Not on file   Social History Narrative  . No narrative on file   Allergies  Allergen Reactions  . Other Anaphylaxis    "TREE NUTS"  . Pravastatin Other (See Comments)    MUSCLE ACHES AND ELEVATED CK  . Ace Inhibitors Cough  . Iodinated Diagnostic Agents Other (See Comments)    KIDNEY "DISORDER"  . Sulfamethoxazole-Trimethoprim Other  (See Comments)    Mouth ulcers  . Allevyn Adhesive [Wound Dressings] Dermatitis    Pt states this adhesive causes severe blisters. Paper tape and band-aids ok.  . Amitriptyline Nausea Only    Last set of Vital Signs (not current) Vitals:   05/18/17 0718 05/18/17 2156  BP: (!) 124/45 122/73  Pulse: (!) 59 62  Resp: 16 18  Temp: 98.3 F (36.8 C) 99.4 F (37.4 C)      Physical Exam  Gen: unresponsive Cardiovascular: pulseless  Resp: apneic. Breath sounds equal bilaterally with bagging  Abd: nondistended  Neuro: GCS 3, unresponsive to pain  HEENT: No blood in posterior pharynx, gag reflex absent . Secretions and airway Neck: No crepitus  Musculoskeletal: No deformity . No obvious dialysis access Skin: warm  Procedures  INTUBATION Performed by: Joni Fears, Treyson Axel Required items: required blood products, implants, devices, and special equipment available Patient identity confirmed: provided demographic data and hospital-assigned identification number Time out: Immediately prior to procedure a "time out" was called to verify the correct patient, procedure, equipment, support staff and site/side marked as required. Indications: Respiratory arrest  Intubation method: Direct laryngoscopy Preoxygenation: BVM Sedatives: None  Paralytic: None  Tube Size: 8-0 cuffed Post-procedure assessment: chest rise and ETCO2 monitor Breath sounds: Diminished over the left chest. Absent over the epigastrium. Strong breath sounds in the right chest Tube secured by Respiratory Therapy Difficult anatomy. Required 3 attempts with a Mac 4 and repositioning. No immediate complications to procedure.   ELECTRIC CARDIOVERSION Performed by: Joni Fears, Dalila Arca Consent:  The procedure was performed in an emergent situation.  Verbal consent was not obtained.  Written consent was not obtained. Risks and benefits: risks, benefits and alternatives were discussed Consent given by: emergent Patient  understanding: patient states understanding of the procedure being performed Patient consent: the patient's understanding of the procedure matches consent given Required items: required blood products, implants, devices, and special equipment available Patient identity confirmed: verbally with patient Patient sedated: no Sedation type: moderate (conscious) sedation Sedatives: none Analgesia: none Cardioversion basis: emergent Pre-procedure rhythm: ventricular tachycardia Patient position: patient was placed in a supine position Chest area: chest area exposed Electrodes: pads Electrodes placed: anterior-posterior Number of attempts: 1 Attempt 1 mode: synchronous Attempt 1 waveform: biphasic Attempt 1 shock (in Joules): 200 Attempt 1 outcome: conversion to normal sinus rhythm Post-procedure rhythm: normal sinus rhythm Complications: no complications Patient tolerance: Patient tolerated the procedure well with no immediate complications    CENTRAL LINE Performed by: Joni Fears, Lumir Demetriou Consent: The procedure was performed in an emergent situation. Attempted during code, barrier precautions not feasible  Required items: required blood products, implants, devices, and special equipment available Patient identity confirmed: arm band and provided  demographic data Time out: Immediately prior to procedure a "time out" was called to verify the correct patient, procedure, equipment, support staff and site/side marked as required. Indications: vascular access Anesthesia: None   Preparation: skin prepped with 2% chlorhexidine Skin prep agent dried: skin prep agent completely dried prior to procedure Sterile barriers: Not feasible during code and ACLS. Hand hygiene: hand hygiene performed prior to central venous catheter insertion  Location details: Right femoral   Catheter type: triple lumen Catheter size: 8 Fr Pre-procedure: landmarks identified Ultrasound guidance: No  Successful  placement: No  Post-procedure: Gauze applied to puncture site  Assessment: Failed attempt at procedure  Patient tolerance:  no immediate complications.    Medical Decision making  Patient in cardiac arrest, possibly due to hyperkalemia versus sepsis. See code flow sheet for more details. Around 3:30 AM, after prolonged ACLS, patient was in V. tach with a pulse. He was cardioverted as documented above to sinus rhythm with a pulse. However, over the ensuing few minutes pulse became weaker and he again had cardiac arrest. He is unable to be revived after that despite remaining attempts at resuscitation.  Assessment and Plan  Cardiac arrest, failed resuscitation, deceased    Carrie Mew, MD 2017-06-15 [phone]

## 2017-06-17 NOTE — Significant Event (Signed)
Upon routine patient rounding pt found unresponsive without pulse. initiated CPR, called for assistance and Code team. Unable to revive patient with MD and code team at bedside. Time of death called by MD.

## 2017-06-17 NOTE — Death Summary Note (Signed)
DEATH SUMMARY   Patient Details  Name: Richard SchullerDarren A [name] MRN: [id number] DOB: 1966-07-13  Admission/Discharge Information   Admit Date:  04/30/2017  Date of Death: Date of Death: 2017-04-12  Time of Death: Time of Death: 0337  Length of Stay: 5  Referring Physician: Rayetta HumphreyGeorge, Sionne A, MD   Reason(s) for Hospitalization  Sepsis   Cellulitis  Diagnoses  Preliminary cause of death:  Secondary Diagnoses (including complications and co-morbidities):  Principal Problem:   Cellulitis in diabetic foot [facility](HCC) Active Problems:   Cellulitis   Brief Hospital Course (including significant findings, care, treatment, and services provided and events leading to death)  [name] is a 51 y.o. year old male  With past medical history of obstructive sleep apnea, hypertension, hyperlipidemia, hypothyroidism, GERD, diabetes, chronic kidney disease stage IV, history of coronary artery disease who presented to the hospital due to fever, right foot pain.  1. Right foot/ankle ulcer-x-rays consistent with severe Charcot-type joint changes. -Seen by podiatry, pt. Ws being maintained on IV Cefepime and there was no plan for urgent surgical intervention unless pt. Continued to have fevers which was improving.  - Podiatry was following and pt. Was to follow up with [facility] upon discharge  2. Acute on Chronic Renal Failure - baseline creatinine around 1.5-2 and trended up as high as 4.7 -Patient was going to be given IV fluids as the cause of her renal failure was thought to be ATN secondary to overdiuresis. Patient although refused getting IV fluids as he thought that he was more volume overloaded and wanted his diuretics reinitiated. -A nephrology consult was also obtained and they discussed the possibility of starting hemodialysis which also the patient did not want to consider at this time. Patient was encouraged to take more oral intake and his diuretics were still held.  3. Hyperkalemia - due to # 2.   -Patient was being treated aggressively with Kayexalate, insulin, D50 and calcium gluconate. Serial potassiums were being followed.  4. Diabetes type 2 without complication- patient did have episodes of hypoglycemia and his Lantus dose was adjusted. Diabetes coordinator was also following the patientSSI.   5. Diabetic neuropathy-continue gabapentin.  6. Hypothyroidism-continue Synthroid. Next  7. Sleep apnea-continue CPAP.  8. CHF- acute on chronic systolic Dysfunction.  - diuretics were being held due to # 2.   9. History of gout-no acute attack. Continue allopurinol.  10. Anxiety-continue Xanax as needed.  On the early morning of 01/29/2017 was found to be unresponsive and a CODE BLUE was initiated. ACLS protocol was initiated and patient was resuscitated aggressively. Unfortunately patient passed away despite aggressive intervention.   Pertinent Labs and Studies  Significant Diagnostic Studies Dg Ankle Complete Right  Result Date: 04/21/2017 CLINICAL DATA:  51 year old male with recent right ankle fracture in Charcot joint. Now with fever and chills. EXAM: RIGHT ANKLE - COMPLETE 3+ VIEW COMPARISON:  04/18/2017 and prior radiographs FINDINGS: Overlying fiberglass cast obscures fine detail. Severe neuropathic/Charcot type changes within the ankle and foot are again identified. A medial malleolar fragment with 2.5 cm displacement is unchanged. Lateral positioning of the fibula and tibia in relation to the sclerotic and fragmented talus again noted. Vascular calcifications again noted. No definite new abnormality noted. IMPRESSION: No definite acute abnormality. Unchanged appearance of the right ankle with severe Charcot type changes and displaced medial malleolar fragment. Electronically Signed   By: Harmon PierJeffrey  Hu M.D.   On: 05/05/2017 15:27   [place] Renal  Result Date: 05/18/2017 CLINICAL DATA:  Acute kidney  failure, underlying stage III chronic kidney disease EXAM: RENAL / URINARY  TRACT ULTRASOUND COMPLETE COMPARISON:  04/21/2011 FINDINGS: Right Kidney: Length: 11.5 cm. Normal cortical thickness and echogenicity. No mass, hydronephrosis or shadowing calcification. Left Kidney: Length: 12.4 cm. Normal cortical echogenicity. Cortical fullness of the mid kidneys unchanged since 2012. No mass, hydronephrosis or shadowing calcification. Bladder: Appears normal for degree of bladder distention. IMPRESSION: No renal sonographic abnormalities identified. Electronically Signed   By: Ulyses SouthwardMark  Boles M.D.   On: 05/18/2017 17:31   Dg Foot 2 Views Right  Result Date: March 08, 2017 CLINICAL DATA:  Recent right ankle fracture and wound in severe Charcot joint. Now with fever and chills. EXAM: RIGHT FOOT - 2 VIEW COMPARISON:  04/18/2017 and prior radiographs FINDINGS: Fiberglass cast obscures fine detail. Severe neuropathic/ Charcot type changes within the ankle and hindfoot/midfoot again noted. Lateral subluxation of the tibia and fibula again noted. Soft tissue swelling is present. No definite new abnormality noted. Vascular calcifications are present. IMPRESSION: Soft tissue swelling without other new abnormalities since the prior study. Unchanged severe neuropathic/ Charcot type changes within the ankle and hindfoot/midfoot. Electronically Signed   By: Harmon PierJeffrey  Hu M.D.   On: 0April 22, 2018 15:30    Microbiology No results found for this or any previous visit (from the past 240 hour(s)).  Lab Basic Metabolic Panel: No results for input(s): NA, K, CL, CO2, GLUCOSE, BUN, CREATININE, CALCIUM, MG, PHOS in the last 168 hours. Liver Function Tests: No results for input(s): AST, ALT, ALKPHOS, BILITOT, PROT, ALBUMIN in the last 168 hours. No results for input(s): LIPASE, AMYLASE in the last 168 hours. No results for input(s): AMMONIA in the last 168 hours. CBC: No results for input(s): WBC, NEUTROABS, HGB, HCT, MCV, PLT in the last 168 hours. Cardiac Enzymes: No results for input(s): CKTOTAL, CKMB,  CKMBINDEX, TROPONINI in the last 168 hours. Sepsis Labs: No results for input(s): PROCALCITON, WBC, LATICACIDVEN in the last 168 hours.  Procedures/Operations  None   SAINANI,VIVEK J 06/13/2017, 1:26 PM

## 2017-06-17 DEATH — deceased

## 2018-10-11 IMAGING — MR MR LUMBAR SPINE W/O CM
4 of 5 series · 24 of 48 positions shown · non-contrast
Comparison: 10/14/2013

CLINICAL DATA: Status post fall.

EXAM:
MRI LUMBAR SPINE WITHOUT CONTRAST
TECHNIQUE: Multiplanar, multisequence MR imaging of the lumbar spine was
performed. No intravenous contrast was administered.

[Series 2: T2 · sagittal · 4.0mm · 0.81mm/px · 6 of 15 slices shown (1 of 2)]
[im 1/15]
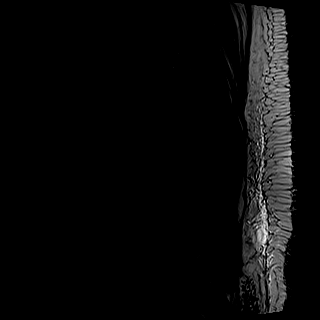
[im 3/15]
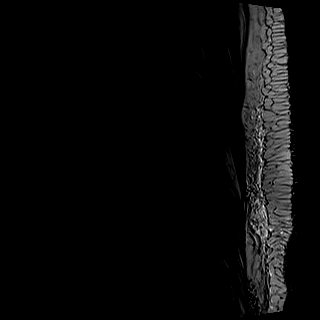
[im 6/15]
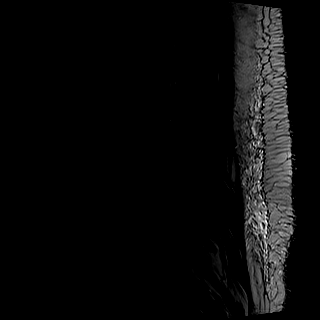
[im 9/15]
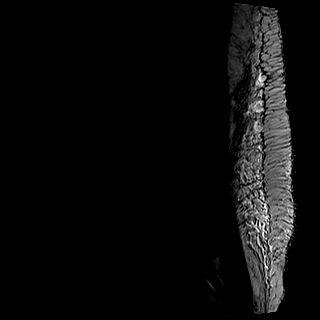
[im 12/15]
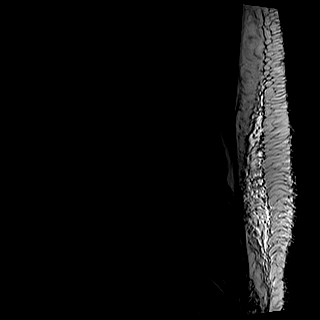
[im 15/15]
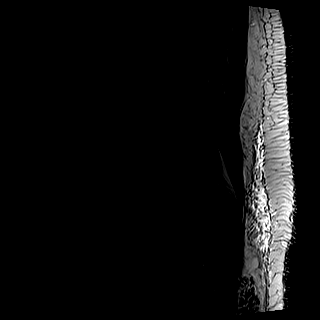

[Series 3: T1 · sagittal · 4.0mm · 0.41mm/px · 6 of 15 slices shown (1 of 2)]
[im 1/15]
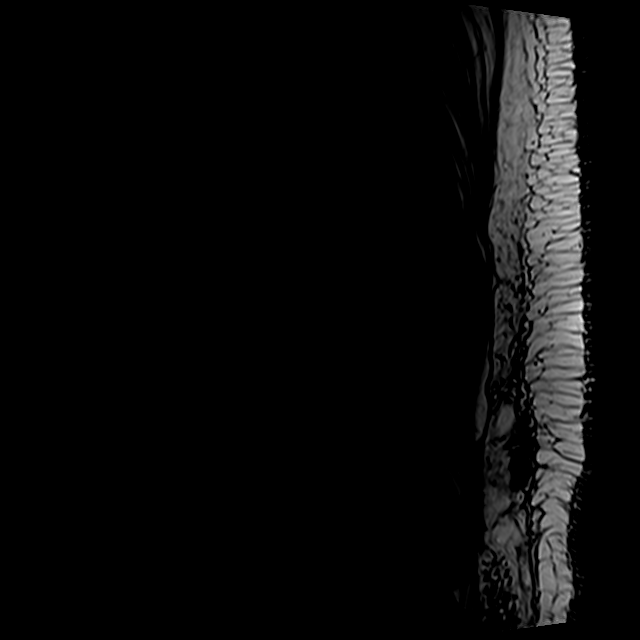
[im 3/15]
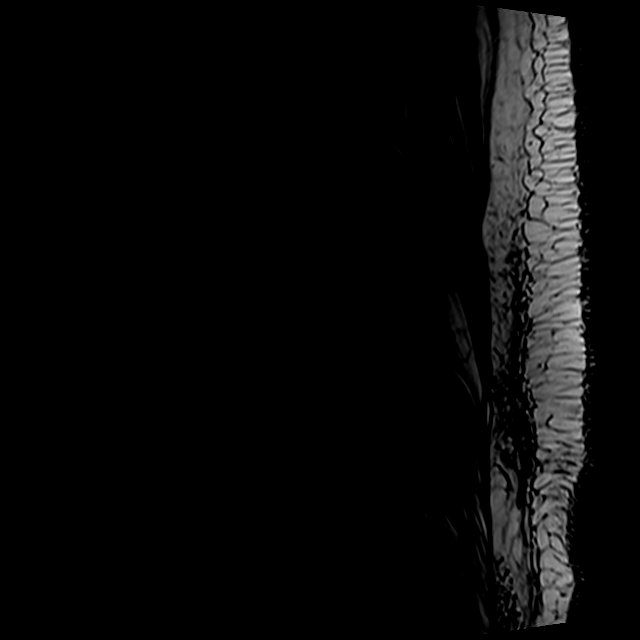
[im 6/15]
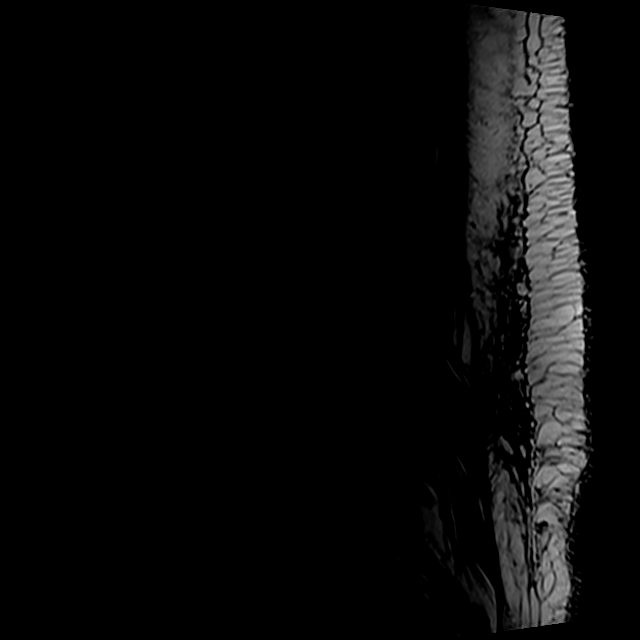
[im 9/15]
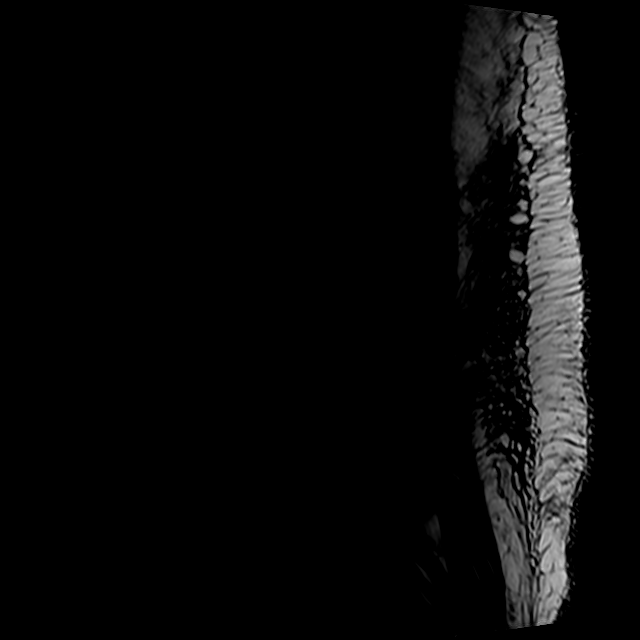
[im 12/15]
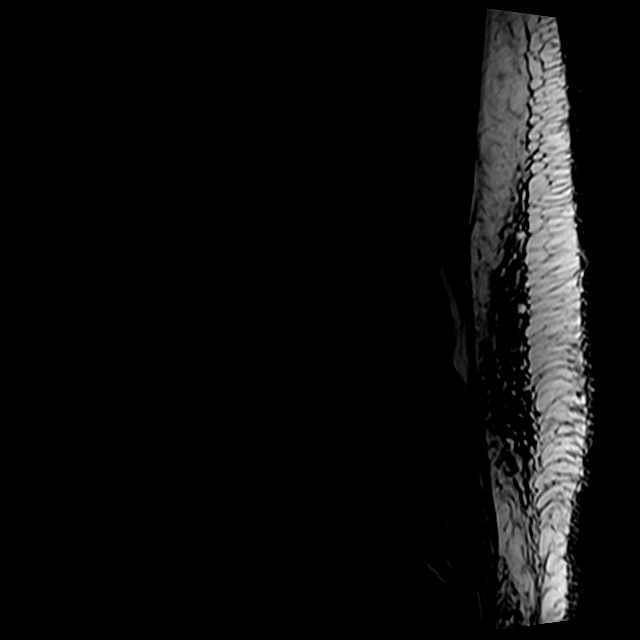
[im 15/15]
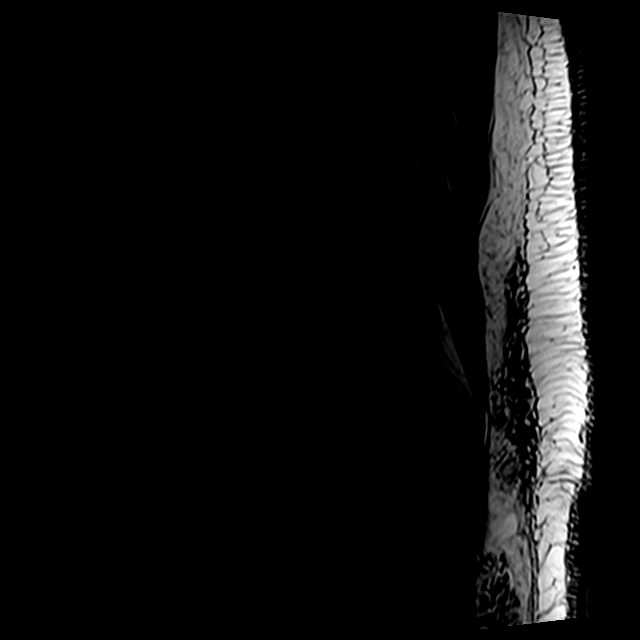

[Series 5: T2 · axial · 4.0mm · 0.78mm/px · z∈[-10,+194]mm · 9 of 36 slices shown (2 of 2)]
[im 1/36]
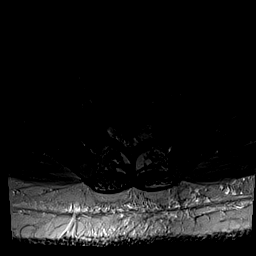
[im 6/36]
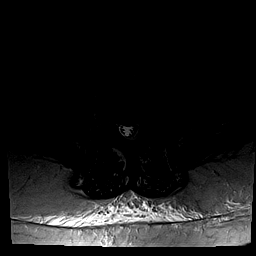
[im 11/36]
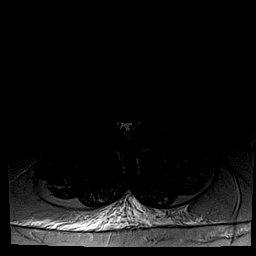
[im 16/36]
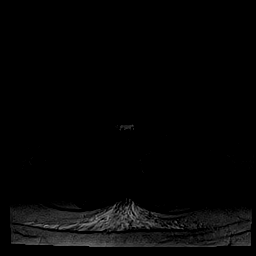
[im 18/36]
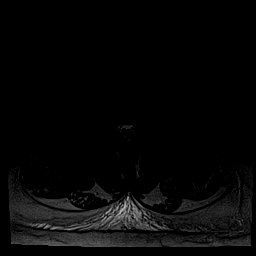
[im 21/36]
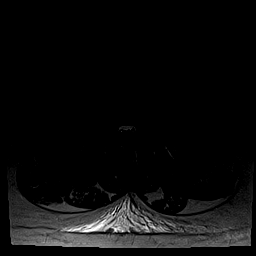
[im 26/36]
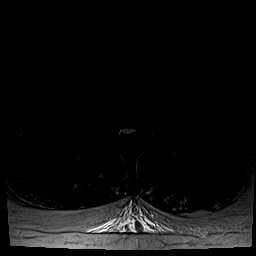
[im 31/36]
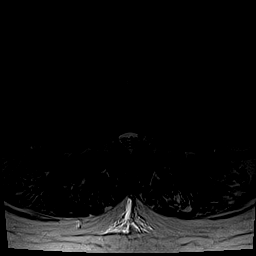
[im 36/36]
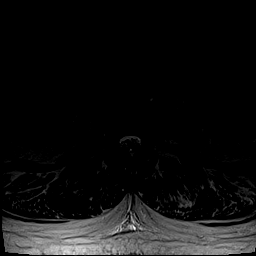

[Series 6: T1 · axial · 4.0mm · 0.31mm/px · z∈[+14,+169]mm · 3 of 36 slices shown (2 of 2)]
[im 6/36]
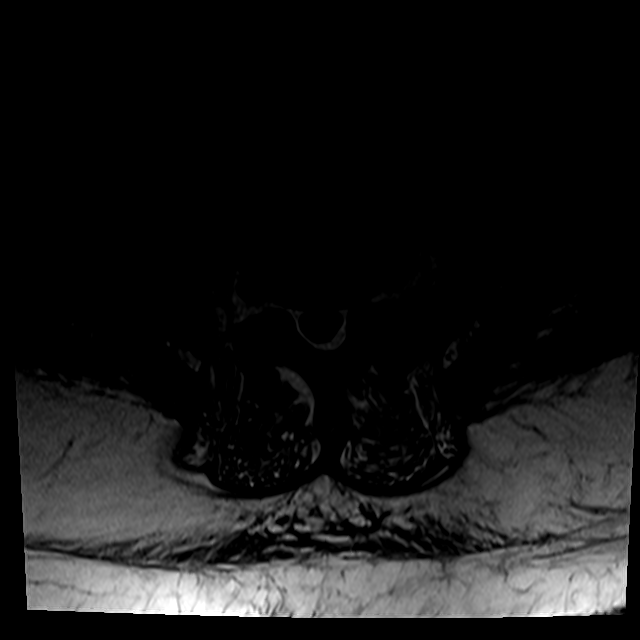
[im 18/36]
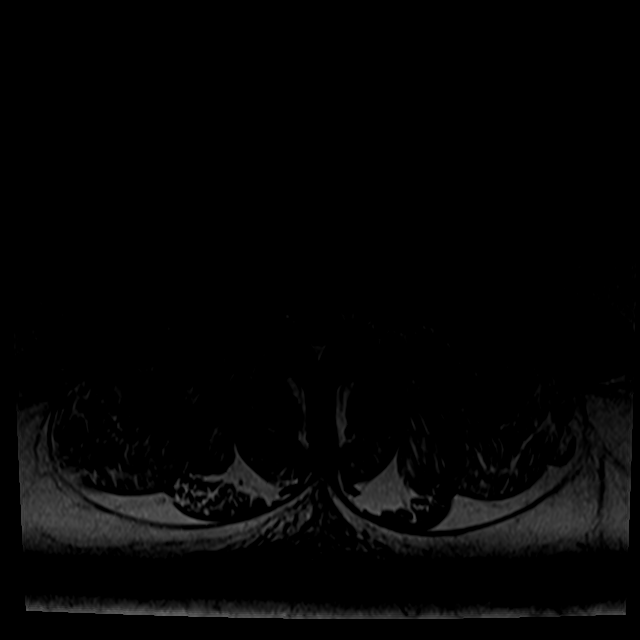
[im 31/36]
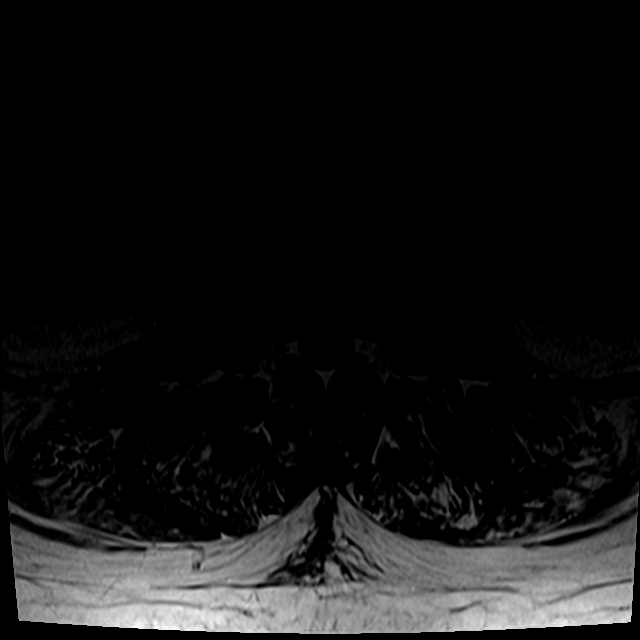

[24 of 48 positions shown; findings below may reference images not displayed]

FINDINGS: Segmentation:  Standard.

Alignment:  Physiologic.

Vertebrae: No fracture, evidence of discitis, or bone lesion.
Congenitally short pedicles resulting in overall small caliber
spinal canal.

Conus medullaris: Extends to the T12 level and appears normal.

Paraspinal and other soft tissues: No paraspinal abnormality.

Disc levels:

Disc spaces: Disc spaces are preserved.

T12-L1: No significant disc bulge. No evidence of neural foraminal
stenosis. No central canal stenosis.

L1-L2: No significant disc bulge. No evidence of neural foraminal
stenosis. No central canal stenosis.

L2-L3: No significant disc bulge. No evidence of neural foraminal
stenosis. No central canal stenosis.

L3-L4: No significant disc bulge. No evidence of neural foraminal
stenosis. No central canal stenosis.

L4-L5: No significant disc bulge. No evidence of neural foraminal
stenosis. No central canal stenosis. Mild bilateral facet
arthropathy.

L5-S1: No significant disc bulge. Moderate left foraminal stenosis.
No right foraminal stenosis. No central canal stenosis. Moderate
left and mild right facet arthropathy.
IMPRESSION: 1. At L5-S1 there is moderate left and mild right facet arthropathy.
Moderate left foraminal stenosis.
2. At L4-5 there is mild bilateral facet arthropathy.
